# Patient Record
Sex: Male | Born: 1950 | Race: Black or African American | Hispanic: No | Marital: Single | Smoking: Former smoker
Health system: Southern US, Community
[De-identification: ages and names within clinical notes are randomized; demographics above are authoritative.]

## PROBLEM LIST (undated history)

## (undated) DIAGNOSIS — R4182 Altered mental status, unspecified: Secondary | ICD-10-CM

## (undated) DIAGNOSIS — K219 Gastro-esophageal reflux disease without esophagitis: Secondary | ICD-10-CM

## (undated) DIAGNOSIS — I214 Non-ST elevation (NSTEMI) myocardial infarction: Secondary | ICD-10-CM

## (undated) DIAGNOSIS — I739 Peripheral vascular disease, unspecified: Secondary | ICD-10-CM

## (undated) DIAGNOSIS — J9621 Acute and chronic respiratory failure with hypoxia: Secondary | ICD-10-CM

## (undated) DIAGNOSIS — E785 Hyperlipidemia, unspecified: Secondary | ICD-10-CM

## (undated) DIAGNOSIS — R195 Other fecal abnormalities: Secondary | ICD-10-CM

## (undated) DIAGNOSIS — I2721 Secondary pulmonary arterial hypertension: Secondary | ICD-10-CM

## (undated) DIAGNOSIS — I219 Acute myocardial infarction, unspecified: Secondary | ICD-10-CM

## (undated) DIAGNOSIS — I2510 Atherosclerotic heart disease of native coronary artery without angina pectoris: Secondary | ICD-10-CM

## (undated) DIAGNOSIS — I10 Essential (primary) hypertension: Secondary | ICD-10-CM

## (undated) DIAGNOSIS — I63411 Cerebral infarction due to embolism of right middle cerebral artery: Secondary | ICD-10-CM

## (undated) DIAGNOSIS — J449 Chronic obstructive pulmonary disease, unspecified: Secondary | ICD-10-CM

## (undated) DIAGNOSIS — C61 Malignant neoplasm of prostate: Secondary | ICD-10-CM

## (undated) HISTORY — DX: Acute myocardial infarction, unspecified: I21.9

## (undated) HISTORY — DX: Hyperlipidemia, unspecified: E78.5

## (undated) HISTORY — DX: Other fecal abnormalities: R19.5

## (undated) HISTORY — DX: Essential (primary) hypertension: I10

## (undated) HISTORY — PX: CARDIAC CATHETERIZATION: SHX172

## (undated) HISTORY — DX: Peripheral vascular disease, unspecified: I73.9

---

## 2007-04-01 ENCOUNTER — Ambulatory Visit: Payer: Self-pay | Admitting: Vascular Surgery

## 2007-09-17 DIAGNOSIS — I219 Acute myocardial infarction, unspecified: Secondary | ICD-10-CM

## 2007-09-17 HISTORY — DX: Acute myocardial infarction, unspecified: I21.9

## 2007-09-17 HISTORY — PX: OTHER SURGICAL HISTORY: SHX169

## 2007-11-10 ENCOUNTER — Ambulatory Visit: Payer: Self-pay | Admitting: Vascular Surgery

## 2007-11-18 ENCOUNTER — Inpatient Hospital Stay (HOSPITAL_COMMUNITY): Admission: EM | Admit: 2007-11-18 | Discharge: 2007-11-22 | Payer: Self-pay | Admitting: Emergency Medicine

## 2007-11-18 ENCOUNTER — Ambulatory Visit: Payer: Self-pay | Admitting: *Deleted

## 2007-12-28 ENCOUNTER — Ambulatory Visit: Payer: Self-pay | Admitting: Cardiology

## 2007-12-30 ENCOUNTER — Ambulatory Visit: Payer: Self-pay | Admitting: Cardiology

## 2007-12-30 LAB — CONVERTED CEMR LAB
Albumin: 3.9 g/dL (ref 3.5–5.2)
Alkaline Phosphatase: 116 units/L (ref 39–117)
Basophils Relative: 0.6 % (ref 0.0–1.0)
Eosinophils Relative: 2 % (ref 0.0–5.0)
HDL: 30.7 mg/dL — ABNORMAL LOW (ref >39.0)
Hemoglobin: 10.5 g/dL — ABNORMAL LOW (ref 13.0–17.0)
LDL Cholesterol: 63 mg/dL (ref 0–99)
Lymphocytes Relative: 29.7 % (ref 12.0–46.0)
MCV: 90.4 fL (ref 78.0–100.0)
Neutrophils Relative %: 59.3 % (ref 43.0–77.0)
RBC: 3.5 M/uL — ABNORMAL LOW (ref 4.22–5.81)
VLDL: 13 mg/dL (ref 0–40)
WBC: 6.7 10*3/uL (ref 4.5–10.5)

## 2008-01-12 ENCOUNTER — Ambulatory Visit: Payer: Self-pay | Admitting: Cardiology

## 2008-01-12 LAB — CONVERTED CEMR LAB
Folate: 7.7 ng/mL
Saturation Ratios: 4.9 % — ABNORMAL LOW (ref 20.0–50.0)
Transferrin: 321.6 mg/dL (ref >=212.0)

## 2008-01-22 ENCOUNTER — Telehealth: Payer: Self-pay | Admitting: Gastroenterology

## 2008-01-22 ENCOUNTER — Encounter: Payer: Self-pay | Admitting: Internal Medicine

## 2008-01-22 ENCOUNTER — Emergency Department (HOSPITAL_COMMUNITY): Admission: EM | Admit: 2008-01-22 | Discharge: 2008-01-22 | Payer: Self-pay | Admitting: Emergency Medicine

## 2008-01-22 ENCOUNTER — Ambulatory Visit: Payer: Self-pay | Admitting: Cardiology

## 2008-01-22 LAB — CONVERTED CEMR LAB
OCCULT 1: POSITIVE — AB
OCCULT 2: POSITIVE — AB
OCCULT 3: POSITIVE — AB
OCCULT 4: POSITIVE — AB

## 2008-01-26 ENCOUNTER — Ambulatory Visit: Payer: Self-pay | Admitting: Internal Medicine

## 2008-02-09 ENCOUNTER — Ambulatory Visit: Payer: Self-pay | Admitting: Cardiology

## 2008-02-09 ENCOUNTER — Ambulatory Visit: Payer: Self-pay

## 2008-02-15 ENCOUNTER — Ambulatory Visit: Payer: Self-pay | Admitting: Internal Medicine

## 2008-02-15 ENCOUNTER — Encounter: Payer: Self-pay | Admitting: Internal Medicine

## 2008-02-15 DIAGNOSIS — R195 Other fecal abnormalities: Secondary | ICD-10-CM

## 2008-02-15 DIAGNOSIS — D509 Iron deficiency anemia, unspecified: Secondary | ICD-10-CM

## 2008-02-16 LAB — CONVERTED CEMR LAB
Basophils Absolute: 0 10*3/uL (ref 0.0–0.1)
Basophils Relative: 0.3 % (ref 0.0–1.0)
Eosinophils Relative: 1.4 % (ref 0.0–5.0)
Lymphocytes Relative: 38.9 % (ref 12.0–46.0)
MCV: 86.5 fL (ref 78.0–100.0)
Monocytes Relative: 8.2 % (ref 3.0–12.0)
Neutrophils Relative %: 51.2 % (ref 43.0–77.0)
Platelets: 275 10*3/uL (ref 150–400)
RBC: 4.2 M/uL — ABNORMAL LOW (ref 4.22–5.81)
WBC: 5.9 10*3/uL (ref 4.5–10.5)

## 2008-02-17 ENCOUNTER — Encounter: Payer: Self-pay | Admitting: Internal Medicine

## 2008-03-08 ENCOUNTER — Ambulatory Visit: Payer: Self-pay | Admitting: Internal Medicine

## 2008-03-09 ENCOUNTER — Ambulatory Visit: Payer: Self-pay | Admitting: Cardiology

## 2008-03-09 LAB — CONVERTED CEMR LAB
OCCULT 1: NEGATIVE
OCCULT 5: NEGATIVE

## 2008-03-25 ENCOUNTER — Ambulatory Visit: Payer: Self-pay | Admitting: Internal Medicine

## 2008-03-25 LAB — CONVERTED CEMR LAB
Basophils Absolute: 0 10*3/uL (ref 0.0–0.1)
Eosinophils Absolute: 0.1 10*3/uL (ref 0.0–0.7)
HCT: 37.2 % — ABNORMAL LOW (ref 39.0–52.0)
Hemoglobin: 12.8 g/dL — ABNORMAL LOW (ref 13.0–17.0)
Iron: 67 ug/dL (ref 42–165)
MCHC: 34.4 g/dL (ref 30.0–36.0)
MCV: 87.9 fL (ref 78.0–100.0)
Monocytes Absolute: 0.7 10*3/uL (ref 0.1–1.0)
Monocytes Relative: 10.7 % (ref 3.0–12.0)
Neutro Abs: 3.2 10*3/uL (ref 1.4–7.7)
Platelets: 237 10*3/uL (ref 150–400)
RDW: 15.4 % — ABNORMAL HIGH (ref 11.5–14.6)
Transferrin: 268 mg/dL (ref >=212.0)

## 2008-03-29 ENCOUNTER — Encounter: Payer: Self-pay | Admitting: Internal Medicine

## 2008-06-02 ENCOUNTER — Ambulatory Visit: Payer: Self-pay | Admitting: Internal Medicine

## 2008-06-02 LAB — CONVERTED CEMR LAB
Eosinophils Relative: 2.3 % (ref 0.0–5.0)
HCT: 38.6 % — ABNORMAL LOW (ref 39.0–52.0)
Hemoglobin: 13.5 g/dL (ref 13.0–17.0)
Iron: 70 ug/dL (ref 42–165)
Lymphocytes Relative: 27.3 % (ref 12.0–46.0)
Monocytes Absolute: 0.8 10*3/uL (ref 0.1–1.0)
Monocytes Relative: 15.8 % — ABNORMAL HIGH (ref 3.0–12.0)
Neutro Abs: 3 10*3/uL (ref 1.4–7.7)
RDW: 12.2 % (ref 11.5–14.6)
Transferrin: 250.6 mg/dL (ref >=212.0)
WBC: 5.3 10*3/uL (ref 4.5–10.5)

## 2008-06-05 ENCOUNTER — Encounter: Payer: Self-pay | Admitting: Internal Medicine

## 2008-07-07 ENCOUNTER — Ambulatory Visit: Payer: Self-pay | Admitting: Cardiology

## 2008-07-12 ENCOUNTER — Ambulatory Visit: Payer: Self-pay | Admitting: Vascular Surgery

## 2008-08-22 ENCOUNTER — Ambulatory Visit: Payer: Self-pay

## 2008-08-22 ENCOUNTER — Ambulatory Visit: Payer: Self-pay | Admitting: Cardiology

## 2008-09-02 ENCOUNTER — Ambulatory Visit: Payer: Self-pay | Admitting: Internal Medicine

## 2008-09-02 LAB — CONVERTED CEMR LAB
HCT: 38.4 % — ABNORMAL LOW (ref 39.0–52.0)
Iron: 60 ug/dL (ref 42–165)
MCV: 94.1 fL (ref 78.0–100.0)
Monocytes Absolute: 0.6 10*3/uL (ref 0.1–1.0)
Monocytes Relative: 8.3 % (ref 3.0–12.0)
Neutro Abs: 4.8 10*3/uL (ref 1.4–7.7)
Platelets: 207 10*3/uL (ref 150–400)
RDW: 11.5 % (ref 11.5–14.6)

## 2008-09-15 DIAGNOSIS — E785 Hyperlipidemia, unspecified: Secondary | ICD-10-CM

## 2008-09-15 DIAGNOSIS — I252 Old myocardial infarction: Secondary | ICD-10-CM

## 2008-09-15 DIAGNOSIS — I739 Peripheral vascular disease, unspecified: Secondary | ICD-10-CM | POA: Insufficient documentation

## 2008-11-10 ENCOUNTER — Encounter: Payer: Self-pay | Admitting: Internal Medicine

## 2008-11-11 ENCOUNTER — Ambulatory Visit: Payer: Self-pay | Admitting: Internal Medicine

## 2008-11-11 LAB — CONVERTED CEMR LAB
Eosinophils Relative: 1.7 % (ref 0.0–5.0)
Iron: 84 ug/dL (ref 42–165)
MCV: 94.5 fL (ref 78.0–100.0)
Monocytes Relative: 8.7 % (ref 3.0–12.0)
Neutrophils Relative %: 54.1 % (ref 43.0–77.0)
Platelets: 209 10*3/uL (ref 150–400)
RDW: 11.5 % (ref 11.5–14.6)
Saturation Ratios: 24.6 % (ref 20.0–50.0)
Transferrin: 243.5 mg/dL (ref 212.0–360.0)

## 2009-03-23 ENCOUNTER — Ambulatory Visit: Payer: Self-pay | Admitting: Cardiology

## 2009-03-30 ENCOUNTER — Ambulatory Visit: Payer: Self-pay | Admitting: Cardiology

## 2009-04-18 ENCOUNTER — Ambulatory Visit: Payer: Self-pay | Admitting: Internal Medicine

## 2009-04-18 LAB — CONVERTED CEMR LAB
Basophils Absolute: 0 10*3/uL (ref 0.0–0.1)
Eosinophils Absolute: 0.1 10*3/uL (ref 0.0–0.7)
Hemoglobin: 13.2 g/dL (ref 13.0–17.0)
Lymphocytes Relative: 30.4 % (ref 12.0–46.0)
MCHC: 34.8 g/dL (ref 30.0–36.0)
Monocytes Absolute: 0.6 10*3/uL (ref 0.1–1.0)
Neutro Abs: 3.1 10*3/uL (ref 1.4–7.7)
Neutrophils Relative %: 57.4 % (ref 43.0–77.0)
RDW: 11.3 % — ABNORMAL LOW (ref 11.5–14.6)
Saturation Ratios: 30 % (ref 20.0–50.0)

## 2009-04-28 LAB — CONVERTED CEMR LAB
ALT: 26 units/L (ref 0–53)
AST: 25 units/L (ref 0–37)
Alkaline Phosphatase: 102 units/L (ref 39–117)
HDL: 29.5 mg/dL — ABNORMAL LOW (ref >39.00)
Total Bilirubin: 1.2 mg/dL (ref 0.3–1.2)
Triglycerides: 107 mg/dL (ref 0.0–149.0)

## 2009-05-03 ENCOUNTER — Encounter: Payer: Self-pay | Admitting: Cardiology

## 2009-07-06 ENCOUNTER — Ambulatory Visit: Payer: Self-pay | Admitting: Vascular Surgery

## 2009-10-12 ENCOUNTER — Ambulatory Visit: Payer: Self-pay | Admitting: Cardiology

## 2009-10-13 ENCOUNTER — Telehealth (INDEPENDENT_AMBULATORY_CARE_PROVIDER_SITE_OTHER): Payer: Self-pay | Admitting: *Deleted

## 2009-10-16 ENCOUNTER — Emergency Department (HOSPITAL_COMMUNITY): Admission: EM | Admit: 2009-10-16 | Discharge: 2009-10-16 | Payer: Self-pay | Admitting: Emergency Medicine

## 2009-11-29 ENCOUNTER — Ambulatory Visit: Payer: Self-pay | Admitting: Cardiovascular Disease

## 2009-11-29 ENCOUNTER — Encounter: Payer: Self-pay | Admitting: Cardiology

## 2009-11-30 ENCOUNTER — Encounter (INDEPENDENT_AMBULATORY_CARE_PROVIDER_SITE_OTHER): Payer: Self-pay | Admitting: *Deleted

## 2009-12-04 ENCOUNTER — Encounter: Payer: Self-pay | Admitting: Cardiology

## 2009-12-04 DIAGNOSIS — D649 Anemia, unspecified: Secondary | ICD-10-CM

## 2009-12-05 ENCOUNTER — Encounter: Payer: Self-pay | Admitting: Cardiology

## 2009-12-07 ENCOUNTER — Ambulatory Visit: Payer: Self-pay | Admitting: Cardiology

## 2009-12-07 DIAGNOSIS — I2510 Atherosclerotic heart disease of native coronary artery without angina pectoris: Secondary | ICD-10-CM | POA: Insufficient documentation

## 2009-12-08 LAB — CONVERTED CEMR LAB
Basophils Absolute: 0 10*3/uL (ref 0.0–0.1)
Basophils Relative: 0 % (ref 0–1)
HCT: 32.4 % — ABNORMAL LOW (ref 39.0–52.0)
Hemoglobin: 9.5 g/dL — ABNORMAL LOW (ref 13.0–17.0)
MCHC: 29.3 g/dL — ABNORMAL LOW (ref 30.0–36.0)
MCV: 83.9 fL (ref 78.0–100.0)
RBC: 3.86 M/uL — ABNORMAL LOW (ref 4.22–5.81)
RDW: 14.8 % (ref 11.5–15.5)

## 2009-12-12 ENCOUNTER — Ambulatory Visit: Payer: Self-pay | Admitting: Internal Medicine

## 2009-12-12 DIAGNOSIS — Z860100 Personal history of colon polyps, unspecified: Secondary | ICD-10-CM | POA: Insufficient documentation

## 2009-12-12 DIAGNOSIS — E119 Type 2 diabetes mellitus without complications: Secondary | ICD-10-CM

## 2009-12-12 DIAGNOSIS — K219 Gastro-esophageal reflux disease without esophagitis: Secondary | ICD-10-CM | POA: Insufficient documentation

## 2009-12-12 DIAGNOSIS — K5730 Diverticulosis of large intestine without perforation or abscess without bleeding: Secondary | ICD-10-CM | POA: Insufficient documentation

## 2009-12-13 LAB — CONVERTED CEMR LAB
Bilirubin Urine: NEGATIVE
Ferritin: 7.7 ng/mL — ABNORMAL LOW (ref 22.0–322.0)
Hemoglobin, Urine: NEGATIVE
Specific Gravity, Urine: >=1.03 (ref 1.000–1.030)
Urine Glucose: NEGATIVE mg/dL
Urobilinogen, UA: 1 (ref 0.0–1.0)

## 2009-12-18 ENCOUNTER — Telehealth: Payer: Self-pay | Admitting: Cardiology

## 2009-12-18 ENCOUNTER — Encounter: Payer: Self-pay | Admitting: Internal Medicine

## 2009-12-21 ENCOUNTER — Ambulatory Visit: Payer: Self-pay | Admitting: Cardiology

## 2009-12-22 ENCOUNTER — Telehealth: Payer: Self-pay | Admitting: Internal Medicine

## 2009-12-22 ENCOUNTER — Encounter: Payer: Self-pay | Admitting: Cardiology

## 2009-12-22 ENCOUNTER — Ambulatory Visit: Payer: Self-pay | Admitting: Internal Medicine

## 2009-12-22 ENCOUNTER — Encounter: Payer: Self-pay | Admitting: Internal Medicine

## 2010-01-02 LAB — CONVERTED CEMR LAB: Retic Ct Pct: 1.2 % (ref 0.4–3.1)

## 2010-01-15 ENCOUNTER — Encounter: Payer: Self-pay | Admitting: Internal Medicine

## 2010-01-23 ENCOUNTER — Ambulatory Visit (HOSPITAL_COMMUNITY): Admission: RE | Admit: 2010-01-23 | Discharge: 2010-01-23 | Payer: Self-pay | Admitting: Internal Medicine

## 2010-01-31 ENCOUNTER — Ambulatory Visit: Payer: Self-pay | Admitting: Cardiovascular Disease

## 2010-01-31 ENCOUNTER — Encounter: Payer: Self-pay | Admitting: Cardiology

## 2010-02-08 ENCOUNTER — Ambulatory Visit: Payer: Self-pay | Admitting: Cardiology

## 2010-02-14 ENCOUNTER — Encounter: Payer: Self-pay | Admitting: Internal Medicine

## 2010-02-14 ENCOUNTER — Ambulatory Visit: Payer: Self-pay | Admitting: Cardiovascular Disease

## 2010-02-23 ENCOUNTER — Ambulatory Visit: Payer: Self-pay | Admitting: Internal Medicine

## 2010-02-23 ENCOUNTER — Ambulatory Visit: Payer: Self-pay | Admitting: Cardiology

## 2010-02-23 LAB — CONVERTED CEMR LAB
Eosinophils Relative: 1.1 % (ref 0.0–5.0)
HCT: 24.2 % — ABNORMAL LOW (ref 39.0–52.0)
Hemoglobin: 8.3 g/dL — ABNORMAL LOW (ref 13.0–17.0)
Lymphs Abs: 2.1 10*3/uL (ref 0.7–4.0)
MCV: 95.1 fL (ref 78.0–100.0)
Monocytes Absolute: 0.6 10*3/uL (ref 0.1–1.0)
Monocytes Relative: 7.7 % (ref 3.0–12.0)
Neutro Abs: 5 10*3/uL (ref 1.4–7.7)
Platelets: 263 10*3/uL (ref 150.0–400.0)
RBC: 2.6 M/uL — ABNORMAL LOW (ref 4.22–5.81)

## 2010-02-25 LAB — CONVERTED CEMR LAB
ALT: 23 units/L (ref 0–53)
AST: 27 units/L (ref 0–37)
Alkaline Phosphatase: 85 units/L (ref 39–117)
Bilirubin, Direct: 0.1 mg/dL (ref 0.0–0.3)
Cholesterol: 129 mg/dL (ref 0–200)
LDL Cholesterol: 76 mg/dL (ref 0–99)
Total Bilirubin: 0.8 mg/dL (ref 0.3–1.2)
Triglycerides: 140 mg/dL (ref 0.0–149.0)
VLDL: 28 mg/dL (ref 0.0–40.0)

## 2010-02-26 ENCOUNTER — Ambulatory Visit (HOSPITAL_COMMUNITY): Admission: RE | Admit: 2010-02-26 | Discharge: 2010-02-26 | Payer: Self-pay | Admitting: Internal Medicine

## 2010-02-27 ENCOUNTER — Telehealth: Payer: Self-pay | Admitting: Cardiology

## 2010-02-28 ENCOUNTER — Encounter (HOSPITAL_COMMUNITY): Admission: RE | Admit: 2010-02-28 | Discharge: 2010-04-25 | Payer: Self-pay | Admitting: Internal Medicine

## 2010-03-07 ENCOUNTER — Ambulatory Visit: Payer: Self-pay | Admitting: Internal Medicine

## 2010-03-09 LAB — CONVERTED CEMR LAB
Basophils Relative: 0.2 % (ref 0.0–3.0)
Eosinophils Absolute: 0.1 10*3/uL (ref 0.0–0.7)
Eosinophils Relative: 1.5 % (ref 0.0–5.0)
Hemoglobin: 8.7 g/dL — ABNORMAL LOW (ref 13.0–17.0)
Lymphocytes Relative: 21.6 % (ref 12.0–46.0)
MCHC: 33 g/dL (ref 30.0–36.0)
MCV: 108 fL — ABNORMAL HIGH (ref 78.0–100.0)
Neutro Abs: 3.9 10*3/uL (ref 1.4–7.7)
Neutrophils Relative %: 67.4 % (ref 43.0–77.0)
RBC: 2.44 M/uL — ABNORMAL LOW (ref 4.22–5.81)
Saturation Ratios: 37.4 % (ref 20.0–50.0)
Transferrin: 223.7 mg/dL (ref 212.0–360.0)
WBC: 5.9 10*3/uL (ref 4.5–10.5)

## 2010-03-20 ENCOUNTER — Ambulatory Visit: Payer: Self-pay | Admitting: Internal Medicine

## 2010-03-20 LAB — CONVERTED CEMR LAB
Basophils Absolute: 0 10*3/uL (ref 0.0–0.1)
Basophils Relative: 0.3 % (ref 0.0–3.0)
Eosinophils Relative: 1.5 % (ref 0.0–5.0)
HCT: 38.1 % — ABNORMAL LOW (ref 39.0–52.0)
Lymphocytes Relative: 30.1 % (ref 12.0–46.0)
Lymphs Abs: 1.7 10*3/uL (ref 0.7–4.0)
Monocytes Relative: 8.9 % (ref 3.0–12.0)
Neutro Abs: 3.4 10*3/uL (ref 1.4–7.7)
RBC: 3.69 M/uL — ABNORMAL LOW (ref 4.22–5.81)
WBC: 5.8 10*3/uL (ref 4.5–10.5)

## 2010-03-27 ENCOUNTER — Telehealth (INDEPENDENT_AMBULATORY_CARE_PROVIDER_SITE_OTHER): Payer: Self-pay | Admitting: *Deleted

## 2010-04-02 ENCOUNTER — Ambulatory Visit: Payer: Self-pay | Admitting: Internal Medicine

## 2010-04-03 LAB — CONVERTED CEMR LAB
Basophils Relative: 0.3 % (ref 0.0–3.0)
Eosinophils Relative: 1.5 % (ref 0.0–5.0)
Hemoglobin: 11.2 g/dL — ABNORMAL LOW (ref 13.0–17.0)
Lymphocytes Relative: 27.1 % (ref 12.0–46.0)
Monocytes Relative: 7.1 % (ref 3.0–12.0)
Neutro Abs: 3.8 10*3/uL (ref 1.4–7.7)
Neutrophils Relative %: 64 % (ref 43.0–77.0)
RBC: 3.23 M/uL — ABNORMAL LOW (ref 4.22–5.81)
WBC: 6 10*3/uL (ref 4.5–10.5)

## 2010-06-25 ENCOUNTER — Ambulatory Visit: Payer: Self-pay | Admitting: Internal Medicine

## 2010-08-24 ENCOUNTER — Ambulatory Visit: Payer: Self-pay | Admitting: Internal Medicine

## 2010-09-06 ENCOUNTER — Ambulatory Visit: Payer: Self-pay | Admitting: Cardiology

## 2010-10-17 NOTE — Assessment & Plan Note (Signed)
Summary: PA CHRIS BERGE/SB   Allergies: No Known Drug Allergies   Pt was randomized to Pegasus-TIMI study on 11/29/09.Pt started Pegaus Study drug on 11/29/09.

## 2010-10-17 NOTE — Progress Notes (Signed)
  Faxed ROI over to [facility] to fax [phone] Call [phone] Cala Bradford Mesiemore  October 13, 2009 2:46 PM    Appended Document:  Recieved Records from [facility] went ahead and scanned into EMR.

## 2010-10-17 NOTE — Assessment & Plan Note (Signed)
Summary: ROV/per Dr.Annetta Deiss   Visit Type:  Follow-up  CC:  Some palpitations.  History of Present Illness: Overall feelinig quite well.  Had an accident at work, and stepped in a hole at work.  Had workup with xrays and now seeing chiropractor.  No chest pain or cardiac symptoms.  Stopped iron supplement after last CBC with Dr. Juanda Chance per their recommendations.  We enrolled him in a research trial, and up front labs suggested recurrent anemia, with drop in MCV.  Denies bleeding from bowel or hematuria.  Current Medications (verified): 1)  Coreg 6.25 Mg Tabs (Carvedilol) .... Take 1 Tablet By Mouth Two Times A Day 2)  Pravastatin Sodium 20 Mg Tabs (Pravastatin Sodium) .... Take 1 Tablet By Mouth Once A Day 3)  Saw Palmetto 1000 Mg Caps (Saw Palmetto (Serenoa Repens)) .... Once A Day 4)  Quinapril Hcl 10 Mg Tabs (Quinapril Hcl) .... Take 1 Tablet By Mouth Once A Day 5)  Lisinopril 5 Mg Tabs (Lisinopril) .... Take One Tablet By Mouth Daily 6)  Aspirin 81 Mg Tbec (Aspirin) .... Take One Tablet By Mouth Daily 7)  Nitroglycerin 0.4 Mg Subl (Nitroglycerin) .... One Tablet Under Tongue Every 5 Minutes As Needed For Chest Pain---May Repeat Times Three 8)  Glipizide-Metformin Hcl 5-500 Mg Tabs (Glipizide-Metformin Hcl) .... Take 1 Tablet By Mouth Two Times A Day 9)  Fish Oil 1000 Mg Caps (Omega-3 Fatty Acids) .... Take 1 Capsule By Mouth Two Times A Day  Allergies (verified): No Known Drug Allergies  Past History:  Past Medical History: Last updated: 09/15/2008 Myocardial infarction, hx of Hypertension Type 2 Diabetes mellitus tobacco use Peripheral vascular disease Hyperlipidemia heme positive stools  Review of Systems  The patient denies anorexia, fever, weight loss, weight gain, hemoptysis, abdominal pain, melena, hematochezia, severe indigestion/heartburn, hematuria, and incontinence.    Vital Signs:  Patient profile:   60 year old male Height:      72 inches Weight:      223  pounds BMI:     30.35 Pulse rate:   74 / minute Pulse rhythm:   regular Resp:     18 per minute BP sitting:   145 / 87  (left arm) Cuff size:   large  Vitals Entered By: Vikki Ports (December 07, 2009 9:17 AM)  Physical Exam  General:  Well developed, well nourished, in no acute distress. Head:  normocephalic and atraumatic Eyes:  PERRLA/EOM intact; conjunctiva and lids normal. Lungs:  Clear bilaterally to auscultation and percussion. Heart:  Non-displaced PMI, chest non-tender; regular rate and rhythm, S1, S2 without murmurs, rubs or gallops. Carotid upstroke normal, no bruit. Normal  Abdomen:  Bowel sounds positive; abdomen soft and non-tender without masses, organomegaly, or hernias noted. No hepatosplenomegaly. Rectal:  normal external exam.  Heme negative today. Msk:  Back normal, normal gait. Muscle strength and tone normal. Extremities:  No clubbing or cyanosis.  Trace edema. Neurologic:  Alert and oriented x 3.   EKG  Procedure date:  12/07/2009  Findings:      NSR.  WNL.   Impression & Recommendations:  Problem # 1:  ANEMIA (ICD-285.9)  recurrent.  May need to see Dr. Juanda Chance.  heme neg today.  Check labs including urine.    Orders: TLB-Ferritin (82728-FER) T-Iron ([phone]) T-Iron Binding Capacity (TIBC) ([address]) T-Reticulocyte Count, Manual (82956) TLB-Udip ONLY (81003-UDIP) EKG w/ Interpretation (93000)  Problem # 2:  HYPERLIPIDEMIA (ICD-272.4)  Followed with labs. His updated medication list for this problem includes:  Pravastatin Sodium 20 Mg Tabs (Pravastatin sodium) .Marland Kitchen... Take 1 tablet by mouth once a day  His updated medication list for this problem includes:    Pravastatin Sodium 20 Mg Tabs (Pravastatin sodium) .Marland Kitchen... Take 1 tablet by mouth once a day  Problem # 3:  MYOCARDIAL INFARCTION, HX OF (ICD-412) remains stable on meds.  His updated medication list for this problem includes:    Coreg 6.25 Mg Tabs (Carvedilol) .Marland Kitchen... Take 1 tablet  by mouth two times a day    Quinapril Hcl 10 Mg Tabs (Quinapril hcl) .Marland Kitchen... Take 1 tablet by mouth once a day    Lisinopril 5 Mg Tabs (Lisinopril) .Marland Kitchen... Take one tablet by mouth daily    Aspirin 81 Mg Tbec (Aspirin) .Marland Kitchen... Take one tablet by mouth daily    Nitroglycerin 0.4 Mg Subl (Nitroglycerin) ..... One tablet under tongue every 5 minutes as needed for chest pain---may repeat times three  Problem # 4:  CAD, NATIVE VESSEL (ICD-414.01) seee above. His updated medication list for this problem includes:    Coreg 6.25 Mg Tabs (Carvedilol) .Marland Kitchen... Take 1 tablet by mouth two times a day    Quinapril Hcl 10 Mg Tabs (Quinapril hcl) .Marland Kitchen... Take 1 tablet by mouth once a day    Lisinopril 5 Mg Tabs (Lisinopril) .Marland Kitchen... Take one tablet by mouth daily    Aspirin 81 Mg Tbec (Aspirin) .Marland Kitchen... Take one tablet by mouth daily    Nitroglycerin 0.4 Mg Subl (Nitroglycerin) ..... One tablet under tongue every 5 minutes as needed for chest pain---may repeat times three  Patient Instructions: 1)  Your physician recommends that you schedule a follow-up appointment in: 6 weeks 2)  Your physician recommends that you have  lab work today:iron,ferritin,tibc, retic count, ua 3)  Your physician has recommended you make the following change in your medication: restart iron

## 2010-10-17 NOTE — Procedures (Signed)
Summary: 792.1, 280.9/dn  Patient here today for capsule endoscopy for Dr.Madolyn Ackroyd .  Pt verbalized understanding of all verbal and written instructions.  Pt tolerated well.  Lot #  [id number]  exp 2012/05 .

## 2010-10-17 NOTE — Miscellaneous (Signed)
  Clinical Lists Changes  Observations: Added new observation of RS STUDY: Pegasus Study (11/30/2009 14:23) Added new observation of RESEARCHCAND: Cardiology (11/30/2009 14:23)      Research Study Name: [employer]

## 2010-10-17 NOTE — Progress Notes (Signed)
Summary: Repeat Labs  Phone Note Call from Patient Call back at Home Phone [phone] Call back at [phone]   Caller: Patient Reason for Call: Talk to Nurse Summary of Call: returning call Initial call taken by: Migdalia Dk,  December 18, 2009 1:26 PM  Follow-up for Phone Call        I spoke with the pt about the results of his urine specimen and blood work.  The pt cannot come back into the office until Thursday  to do a urine Culture & Sensitivity.  The pt will also need an Iron, TIBC and Reticulocyte Count done since these were not drawn on 12/07/09 when this was ordered.  Order for labwork placed in [facility]. Follow-up by: Julieta Gutting, RN, BSN,  December 18, 2009 3:00 PM

## 2010-10-17 NOTE — Procedures (Signed)
Summary: Capsule Endoscopy/[facility]  Capsule Endoscopy/[facility]   Imported By: Sherian Rein 01/16/2010 14:32:41  _____________________________________________________________________  External Attachment:    Type:   Image     Comment:   External Document

## 2010-10-17 NOTE — Assessment & Plan Note (Signed)
Summary: ANEMIA, HGB 9.5/DN   History of Present Illness Visit Type: Follow-up Visit Primary GI MD: Lina Sar MD Primary Provider: Lucianne Lei, MD Requesting Provider: Eustaquio Maize, MD Chief Complaint: Patient referred for low HGB. Patient denies any Gi complaints.  History of Present Illness:   This is a 60 year old African American patient of Dr. Riley Kill with anemia. His latest hemoglobin was 9.5. He is here today because a significan drop in hemoglobin. He has a history of chronic GI blood loss. His upper endoscopy and colonoscopy completed in June 2009 were both negative except for mild inflammation in the distal esophagus and mild diverticulosis. There was a small polyp removed from his colon. He responded to iron supplements and his hemoglobin promptly increased to 12.8, with a hematocrit of 37.2 in July 2009. In September 2009, his hemoglobin was 13.5 and his hematocrit was 38.6. In February 2010, his hemoglobin was 13.5, in August 2010 hemoglobin was 13.2 with a 30% iron saturation. On 12/05/09, hemoglobin was 9.5, hematocrit was 32.4 and MCV was 83 with a ferritin of 7.0. He takes aspirin 81 mg daily. Additional medical problems are peripheral vascular disease, history of myocardial infarction in March 2009 with a bare-metal stent placement x 3. He is a smoker and type II Diabetic.   GI Review of Systems      Denies abdominal pain, acid reflux, belching, bloating, chest pain, dysphagia with liquids, dysphagia with solids, heartburn, loss of appetite, nausea, vomiting, vomiting blood, weight loss, and  weight gain.        Denies anal fissure, black tarry stools, change in bowel habit, constipation, diarrhea, diverticulosis, fecal incontinence, heme positive stool, hemorrhoids, irritable bowel syndrome, jaundice, light color stool, liver problems, rectal bleeding, and  rectal pain.    Current Medications (verified): 1)  Coreg 6.25 Mg Tabs (Carvedilol) .... Take 1 Tablet By Mouth Two  Times A Day 2)  Pravastatin Sodium 20 Mg Tabs (Pravastatin Sodium) .... Take 1 Tablet By Mouth Once A Day 3)  Saw Palmetto 1000 Mg Caps (Saw Palmetto (Serenoa Repens)) .... Once A Day 4)  Quinapril Hcl 10 Mg Tabs (Quinapril Hcl) .... Take 1 Tablet By Mouth Once A Day 5)  Lisinopril 5 Mg Tabs (Lisinopril) .... Take One Tablet By Mouth Daily 6)  Aspirin 81 Mg Tbec (Aspirin) .... Take One Tablet By Mouth Daily 7)  Nitroglycerin 0.4 Mg Subl (Nitroglycerin) .... One Tablet Under Tongue Every 5 Minutes As Needed For Chest Pain---May Repeat Times Three 8)  Glipizide-Metformin Hcl 5-500 Mg Tabs (Glipizide-Metformin Hcl) .... Take 1 Tablet By Mouth Two Times A Day 9)  Fish Oil 1000 Mg Caps (Omega-3 Fatty Acids) .... Take 1 Capsule By Mouth Two Times A Day 10)  Ferrous Sulfate 325 (65 Fe) Mg Tabs (Ferrous Sulfate) .... Take One Tablet Daily  Allergies (verified): No Known Drug Allergies  Past History:  Past Medical History: Reviewed history from 09/15/2008 and no changes required. Myocardial infarction, hx of Hypertension Type 2 Diabetes mellitus tobacco use Peripheral vascular disease Hyperlipidemia heme positive stools  Past Surgical History: Reviewed history from 12/12/2009 and no changes required. 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  Alcohol Use - no Illicit Drug Use - no  Review of Systems       The patient complains of anemia, back pain, fatigue,  and muscle pains/cramps.  The patient denies allergy/sinus, anxiety-new, arthritis/joint pain, blood in urine, breast changes/lumps, change in vision, confusion, cough, coughing up blood, depression-new, fainting, fever, headaches-new, hearing problems, heart murmur, heart rhythm changes, itching, menstrual pain, night  sweats, nosebleeds, pregnancy symptoms, shortness of breath, skin rash, sleeping problems, sore throat, swelling of feet/legs, swollen lymph glands, thirst - excessive, urination - excessive, urination changes/pain, urine leakage, vision changes, and voice change.         Pertinent positive and negative review of systems were noted in the above HPI. All other ROS was otherwise negative.   Vital Signs:  Patient profile:   60 year old male Height:      72 inches Weight:      225.0 pounds BMI:     30.63 Pulse rate:   84 / minute Pulse rhythm:   regular BP sitting:   150 / 76  (left arm) Cuff size:   regular  Vitals Entered By: Harlow Mares CMA Duncan Dull) (December 12, 2009 1:28 PM)  Physical Exam  General:  Well developed, well nourished, no acute distress. Eyes:  PERRLA, no icterus. Mouth:  No deformity or lesions, dentition normal. Neck:  Supple; no masses or thyromegaly. Lungs:  Clear throughout to auscultation. Heart:  Regular rate and rhythm; no murmurs, rubs,  or bruits. Abdomen:  soft, nontender abdomen with normoactive bowel sounds. No distention. No bruits. Liver edge at costal margin. Rectal:  normal rectal sphincter tone. Stool is Hemoccult positive. Extremities:  No clubbing, cyanosis, edema or deformities noted. Skin:  hyperpigmentation in the right iliac crest. Psych:  Alert and cooperative. Normal mood and affect.   Impression & Recommendations:  Problem # 1:  ANEMIA (ICD-285.9)  Patient has recurrent episodes of iron deficiency anemia. His chronic GI blood loss was not defined after a normal endoscopy and colonoscopy in 2009. I suspect he has AV malformations. He is Hemoccult-positive today. We will proceed with a small bowel capsule endoscopy. We have talked about AVMs today. He will have to stop his iron supplements for several days prior to the capsule endoscopy and then resume it right after that. It appears that when he is on iron supplements he is able to maintain his  normal hemoglobin.  Orders: Capsule Endoscopy (Capsule Endoscopy)  Problem # 2:  CAD, NATIVE VESSEL (ICD-414.01) Patient is followed by Dr. Riley Kill.  Problem # 3:  COLONIC POLYPS, BENIGN, HX OF (ICD-V12.72) Patient's last colonoscopy was in June 2009. A recall colonoscopy will be scheduled in 2016.  Patient Instructions: 1)  small bowel capsule endoscopy. 2)  Discontinue iron supplements prior to the exam. 3)  Hemoglobin and hematocrit to be checked every 3 months. 4)  Continue use of iron after capsule endoscopy is completed. I asked him not to ever stop taking his iron. 5)  Copy sent to : Dr Ermalene Postin, Lucianne Lei 6)  The medication list was reviewed and reconciled.  All changed / newly prescribed medications were explained.  A complete medication list was provided to the patient / caregiver.

## 2010-10-17 NOTE — Miscellaneous (Signed)
  Clinical Lists Changes  Medications: Added new medication of * PEGASUS STUDY DRUG

## 2010-10-17 NOTE — Procedures (Signed)
Summary: Capsule Endoscopy   Capsule Endoscopy  Procedure date:  12/18/2009  Findings:      Performing Location: [facility]   Ordering Physician: Lina Sar, MD  Report created/read by: Stan Head, MD  Reason for Referral:  60 y/o male with iron deficiency anemia and heme positive stool  Procedure Information and Findings:  1) complete study, good prep 2) few duodenal erosions 3 ) small AVM at 17 minutes 4) 2 larger AVMs at around 3 hours- almost 3 hours beyond first duodenal image and therefore not reachable with enteroscopy.  Summary and Recommendations:  Per Dr Juanda Chance  This report was created from the original report, which was reviewed and signed by the above listed reading physician.   Appended Document: Capsule Endoscopy I tried to reach him at homeand at work. Will try again  Appended Document: Capsule Endoscopy I tried to call him again, left message.

## 2010-10-17 NOTE — Assessment & Plan Note (Signed)
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 He has chronic low-grade gastroentestinal blood loss. His hemoglobin dropped to 8.3 on June 10. He received an iron infusion on 02/26/10. His hemoglobin on 03/07/10 was 8.7. His MCV was 108. He has a history of coronary artery disease and is status post myocardial infarction and peripheral vascular diseas. His MI was in 2009 and he is status post bare-metal stent placement. He has been followed by Dr Riley Kill. He is a diabetic. Patient has been feeling much stronger since he has received his iron infusion. He denies any abdominal pain, dysphagia or change in bowel habits. He has been on iron sulfate 3 times a day.   GI Review of Systems      Denies abdominal pain, acid reflux, belching, bloating, chest pain, dysphagia with liquids, dysphagia with solids, heartburn, loss of appetite, nausea, vomiting, vomiting blood, weight loss, and  weight gain.        Denies anal fissure, black tarry stools, change in bowel habit, constipation, diarrhea, diverticulosis, fecal incontinence, heme positive stool, hemorrhoids, irritable bowel syndrome, jaundice, light color stool, liver problems, rectal bleeding, and  rectal pain.    Current Medications (verified): 1)  Coreg 6.25 Mg Tabs (Carvedilol) .... Take 1 Tablet By Mouth Two Times A Day 2)  Pravastatin Sodium 20 Mg Tabs (Pravastatin Sodium) .... Take 1 Tablet By Mouth Once A Day 3)  Saw Palmetto 1000 Mg Caps (Saw Palmetto (Serenoa Repens)) .... Once A Day 4)  Quinapril Hcl 20 Mg Tabs (Quinapril Hcl) .... Take 1 Tablet By Mouth Once A Day 5)   Lisinopril 5 Mg Tabs (Lisinopril) .... Take One Tablet By Mouth Daily 6)  Nitroglycerin 0.4 Mg Subl (Nitroglycerin) .... One Tablet Under Tongue Every 5 Minutes As Needed For Chest Pain---May Repeat Times Three 7)  Glipizide-Metformin Hcl 5-500 Mg Tabs (Glipizide-Metformin Hcl) .... Take 1 Tablet By Mouth Two Times A Day 8)  Fish Oil 1000 Mg Caps (Omega-3 Fatty Acids) .... Take 1 Capsule By Mouth Two Times A Day 9)  Ferrous Sulfate 325 (65 Fe) Mg Tabs (Ferrous Sulfate) .... Take One Tablet Daily  Allergies (verified): No Known Drug Allergies  Past History:  Past Medical History: Reviewed history from 09/15/2008 and no changes required. Myocardial infarction, hx of Hypertension Type 2 Diabetes mellitus tobacco use Peripheral vascular disease Hyperlipidemia heme positive stools  Past Surgical History: Reviewed history from 12/12/2009 and no changes required. Cardiac Stents- March 2009  Family History: Reviewed history from 12/12/2009 and no changes required. 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  Alcohol Use - no Illicit Drug Use - no  Review of Systems       The patient complains of anemia, back pain, depression-new, and muscle pains/cramps.  The patient denies allergy/sinus, anxiety-new, arthritis/joint pain, blood in urine, breast changes/lumps, change in vision, confusion, cough, coughing up blood, fainting, fatigue, fever, headaches-new, hearing problems, heart murmur, heart rhythm changes, itching, menstrual pain, night sweats, nosebleeds, pregnancy symptoms, shortness of breath, skin rash, sleeping problems, sore throat,  swelling of feet/legs, swollen lymph glands, thirst - excessive , urination - excessive , urination changes/pain, urine leakage, vision changes, and voice change.         Pertinent  positive and negative review of systems were noted in the above HPI. All other ROS was otherwise negative.   Vital Signs:  Patient profile:   60 year old male Height:      72 inches Weight:      222.38 pounds BMI:     30.27 Pulse rate:   64 / minute Pulse rhythm:   regular BP sitting:   120 / 68  (left arm) Cuff size:   regular  Vitals Entered By: June McMurray CMA Duncan Dull) (March 20, 2010 1:26 PM)  Physical Exam  General:  Well developed, well nourished, no acute distress. Eyes:  nonicteric. Neck:  Supple; no masses or thyromegaly. Lungs:  Clear throughout to auscultation. Heart:  Regular rate and rhythm; no murmurs, rubs,  or bruits. Abdomen:  soft nontender abdomen with normoactive bowel sounds. No tenderness. Liver edge at costal margin. Extremities:  No clubbing, cyanosis, edema or deformities noted. Skin:  Intact without significant lesions or rashes. Psych:  Alert and cooperative. Normal mood and affect.   Impression & Recommendations:  Problem # 1:  ANEMIA (ICD-285.9) Patient has chronic low-grade GI blood loss due to AVMs which were demonstrated on a recent small bowel capsule endoscopy. I have talked to the patient extensively concerning his need to continue his iron supplements indefinitely and also to have his blood count checked monthly. He is currently holding his aspirin until he sees Dr. Riley Kill. Dr.Stuckey will tell him if he needs to go back on his aspirin.   Orders: TLB-CBC Platelet - w/Differential ([id number])  Problem # 2:  COLONIC POLYPS, BENIGN, HX OF (ICD-V12.72) Patient is up-to-date on his colonoscopy and endoscopy which were done in June 2009.  Patient Instructions: 1)  Please go to the basement floor of our office to have your CBC drawn today. 2)  stay off aspirin unless Dr Riley Kill intructs You otherwise. 3)  Please schedule a follow-up appointment as needed.  4)  Copy sent to : Dr Lucianne Lei, Dr Shawnie Pons 5)  The medication list was reviewed  and reconciled.  All changed / newly prescribed medications were explained.  A complete medication list was provided to the patient / caregiver.

## 2010-10-17 NOTE — Procedures (Signed)
Summary: Instructions for procedure/[facility]  Instructions for procedure/[facility]   Imported By: Sherian Rein 12/16/2009 09:30:09  _____________________________________________________________________  External Attachment:    Type:   Image     Comment:   External Document

## 2010-10-17 NOTE — Assessment & Plan Note (Signed)
Summary: f32m   Visit Type:  6 months follow up  CC:  No complains.  History of Present Illness: Still tossing boxes, but not doing exercising.  Patient continues to smoke.  He has no ability to stop.  No chest pain whatsoever.   Current Medications (verified): 1)  Coreg 6.25 Mg Tabs (Carvedilol) .... Take 1 Tablet By Mouth Two Times A Day 2)  Pravastatin Sodium 20 Mg Tabs (Pravastatin Sodium) .... Take 1 Tablet By Mouth Once A Day 3)  Saw Palmetto 1000 Mg Caps (Saw Palmetto (Serenoa Repens)) .... Once A Day 4)  Quinapril Hcl 10 Mg Tabs (Quinapril Hcl) .... Take 1 Tablet By Mouth Once A Day 5)  Lisinopril 5 Mg Tabs (Lisinopril) .... Take One Tablet By Mouth Daily 6)  Aspirin 81 Mg Tbec (Aspirin) .... Take One Tablet By Mouth Daily 7)  Nitroglycerin 0.4 Mg Subl (Nitroglycerin) .... One Tablet Under Tongue Every 5 Minutes As Needed For Chest Pain---May Repeat Times Three 8)  Glipizide-Metformin Hcl 5-500 Mg Tabs (Glipizide-Metformin Hcl) .... Take 1 Tablet By Mouth Two Times A Day 9)  Fish Oil 1000 Mg Caps (Omega-3 Fatty Acids) .... Take 1 Capsule By Mouth Two Times A Day  Allergies (verified): No Known Drug Allergies  Vital Signs:  Patient profile:   60 year old male Height:      72 inches Weight:      224.25 pounds BMI:     30.52 Pulse rate:   77 / minute Pulse rhythm:   regular Resp:     18 per minute BP sitting:   148 / 84  (left arm) Cuff size:   large  Vitals Entered By: Vikki Ports (October 12, 2009 4:37 PM)  Physical Exam  General:  Well developed, well nourished, in no acute distress. Head:  normocephalic and atraumatic Eyes:  PERRLA/EOM intact; conjunctiva and lids normal. Lungs:  Clear bilaterally to auscultation and percussion. Heart:  Non-displaced PMI, chest non-tender; regular rate and rhythm, S1, S2 without murmurs, rubs or gallops. Carotid upstroke normal, no bruit. Normal abdominal aortic size, no bruits. Femorals normal pulses, no bruits. Pedals normal  pulses. No edema, no varicosities. Abdomen:  Bowel sounds positive; abdomen soft and non-tender without masses, organomegaly, or hernias noted. No hepatosplenomegaly. Pulses:  pulses normal in all 4 extremities Extremities:  No clubbing or cyanosis. Neurologic:  Alert and oriented x 3.   EKG  Procedure date:  10/12/2009  Findings:      NSR.  Possible old inferior MI of indeterminate age.  Impression & Recommendations:  Problem # 1:  MYOCARDIAL INFARCTION, HX OF (ICD-412) Continues to remain stable.  Continues to smoke unfortunately.  Issue discussed with techniques to quit.   His updated medication list for this problem includes:    Coreg 6.25 Mg Tabs (Carvedilol) .Marland Kitchen... Take 1 tablet by mouth two times a day    Quinapril Hcl 10 Mg Tabs (Quinapril hcl) .Marland Kitchen... Take 1 tablet by mouth once a day    Lisinopril 5 Mg Tabs (Lisinopril) .Marland Kitchen... Take one tablet by mouth daily    Aspirin 81 Mg Tbec (Aspirin) .Marland Kitchen... Take one tablet by mouth daily    Nitroglycerin 0.4 Mg Subl (Nitroglycerin) ..... One tablet under tongue every 5 minutes as needed for chest pain---may repeat times three  Orders: EKG w/ Interpretation (93000)  Problem # 2:  HYPERLIPIDEMIA (ICD-272.4) Remains on treatment.  had some recent labs done in [place]. His updated medication list for this problem includes:    Pravastatin  Sodium 20 Mg Tabs (Pravastatin sodium) .Marland Kitchen... Take 1 tablet by mouth once a day  Problem # 3:  PERIPHERAL VASCULAR DISEASE (ICD-443.9)  Patient Instructions: 1)  Your physician recommends that you continue on your current medications as directed. Please refer to the Current Medication list given to you today. 2)  Your physician wants you to follow-up in:  6 MONTHS. You will receive a reminder letter in the mail two months in advance. If you don't receive a letter, please call our office to schedule the follow-up appointment.

## 2010-10-17 NOTE — Progress Notes (Signed)
Summary: lab results  Phone Note Call from Patient Call back at [phone]   Caller: Patient Reason for Call: Talk to Nurse Summary of Call: returning call Initial call taken by: Migdalia Dk,  February 27, 2010 2:20 PM  Follow-up for Phone Call        I spoke with the pt and made him aware of his lab results.  The pt will hold his ASA at this time per Dr Rosalyn Charters instructions.  The pt is scheduled for another Iron infusion on 02/28/10. Follow-up by: Julieta Gutting, RN, BSN,  February 27, 2010 2:55 PM    New/Updated Medications: ASPIRIN 81 MG TBEC (ASPIRIN) Take one tablet by mouth daily--on hold

## 2010-10-17 NOTE — Miscellaneous (Signed)
Summary: Orders Update  Clinical Lists Changes  Problems: Added new problem of ANEMIA (ICD-285.9) - Signed Orders: Added new Test order of T-CBC w/Diff [phone]) - Signed   Ordered per Fannie Knee in research.

## 2010-10-17 NOTE — Progress Notes (Signed)
Summary: Iron Infusion Scheduled & F/U Labs Scheduled  Phone Note Outgoing Call   Call placed by: Laureen Ochs LPN,  December 22, 2009 8:43 AM Call placed to: Patient Summary of Call: Pt. is scheduled for an Iron Infusion w/test dose at [facility] on 4 27-11 at 8am. Pt. is here for his Capsule Endo, I have given him his instructions for the Iron Infusion.  Pt. is also scheduled for F/U labs on 4-25-11and every 3 monthes x3, per Dr.Brodie. (Lab orders are in IDX) Pt. instructed to call back as needed.  Initial call taken by: Laureen Ochs LPN,  December 22, 2009 8:44 AM     Appended Document: Iron Infusion Scheduled & F/U Labs Scheduled CORRECTION--Pt. will have labs every 2 monthes x 3.  Appended Document: Iron Infusion Scheduled & F/U Labs Scheduled CORRECTIONS TO ORIGINAL NOTE--Pt. will have f/u labs on 02-07-10, 04-09-10, 06-10-10 and 08-10-10.   Appended Document: Iron Infusion Scheduled & F/U Labs Scheduled Pt. cancelled his iron infusion, states his car broke down. He states he will call next week to reschedule it. Dr.Brodie notified.  Appended Document: Iron Infusion Scheduled & F/U Labs Scheduled Iron infusion rescheduled to 01-23-10. He will have labs done on 02-23-10, 04-25-10, 06-25-10 and 08-25-10. Pt. instructed to call back as needed.

## 2010-10-17 NOTE — Assessment & Plan Note (Signed)
Summary: ROV   Visit Type:  Follow-up Referring Provider:  Eustaquio Maize, MD Primary Provider:  Lucianne Lei, MD  CC:  Pt. states he is not taking Quinapril for about a week due to [employer] issues. Med increased to 20 mg. per Dr. Anne Hahn.  History of Present Illness: Patient got to see Dr. Juanda Chance, and has avM'S of the small bowel.  Got an IV infusion of iron.  No chest pain.  Feels good overall.  Still smoking and having trouble quitting.  Feels he is doing well at this point.    Current Medications (verified): 1)  Coreg 6.25 Mg Tabs (Carvedilol) .... Take 1 Tablet By Mouth Two Times A Day 2)  Pravastatin Sodium 20 Mg Tabs (Pravastatin Sodium) .... Take 1 Tablet By Mouth Once A Day 3)  Saw Palmetto 1000 Mg Caps (Saw Palmetto (Serenoa Repens)) .... Once A Day 4)  Quinapril Hcl 20 Mg Tabs (Quinapril Hcl) .... Take 1 Tablet By Mouth Once A Day 5)  Lisinopril 5 Mg Tabs (Lisinopril) .... Take One Tablet By Mouth Daily 6)  Aspirin 81 Mg Tbec (Aspirin) .... Take One Tablet By Mouth Daily 7)  Nitroglycerin 0.4 Mg Subl (Nitroglycerin) .... One Tablet Under Tongue Every 5 Minutes As Needed For Chest Pain---May Repeat Times Three 8)  Glipizide-Metformin Hcl 5-500 Mg Tabs (Glipizide-Metformin Hcl) .... Take 1 Tablet By Mouth Two Times A Day 9)  Fish Oil 1000 Mg Caps (Omega-3 Fatty Acids) .... Take 1 Capsule By Mouth Two Times A Day 10)  Ferrous Sulfate 325 (65 Fe) Mg Tabs (Ferrous Sulfate) .... Take One Tablet Daily  Allergies (verified): No Known Drug Allergies  Vital Signs:  Patient profile:   60 year old male Height:      72 inches Weight:      220.75 pounds BMI:     30.05 Pulse rate:   76 / minute Pulse rhythm:   regular Resp:     18 per minute BP sitting:   148 / 80  (left arm) Cuff size:   large  Vitals Entered By: Vikki Ports (Feb 08, 2010 9:43 AM)  Physical Exam  General:  Well developed, well nourished, in no acute distress. Head:  normocephalic and atraumatic Eyes:  PERRLA/EOM  intact; conjunctiva and lids normal. Lungs:  Expiratory ronchii.  No rales or dullness to percussion. Heart:  PMI non displaced.  No murmur or rub.   Extremities:  No clubbing or cyanosis. Neurologic:  Alert and oriented x 3.   Impression & Recommendations:  Problem # 1:  CAD, NATIVE VESSEL (ICD-414.01) Has CAD, DM, smokes, and therefore increased risk.   Risk factors reviewed with patient.    His updated medication list for this problem includes:    Coreg 6.25 Mg Tabs (Carvedilol) .Marland Kitchen... Take 1 tablet by mouth two times a day    Quinapril Hcl 20 Mg Tabs (Quinapril hcl) .Marland Kitchen... Take 1 tablet by mouth once a day    Lisinopril 5 Mg Tabs (Lisinopril) .Marland Kitchen... Take one tablet by mouth daily    Aspirin 81 Mg Tbec (Aspirin) .Marland Kitchen... Take one tablet by mouth daily    Nitroglycerin 0.4 Mg Subl (Nitroglycerin) ..... One tablet under tongue every 5 minutes as needed for chest pain---may repeat times three  Problem # 2:  ANEMIA (ICD-285.9) followed by Dr. Lina Sar.  See reports.  AVM of small intestine.  Problem # 3:  HYPERLIPIDEMIA (ICD-272.4) Lipid and liver profile. His updated medication list for this problem includes:    Pravastatin  Sodium 20 Mg Tabs (Pravastatin sodium) .Marland Kitchen... Take 1 tablet by mouth once a day  Patient Instructions: 1)  Your physician recommends that you continue on your current medications as directed. Please refer to the Current Medication list given to you today. 2)  Your physician wants you to follow-up in:   6 MONTHS. You will receive a reminder letter in the mail two months in advance. If you don't receive a letter, please call our office to schedule the follow-up appointment.

## 2010-10-17 NOTE — Letter (Signed)
Summary: Results Letter  [facility]  [address] [place], [place] [address]   Phone: [phone]  Fax: [phone]        Jan 15, 2010 MRN: [id number]    ARVO EALY [address] [place], [place]  [address]    Dear Mr. Kistner, The small bowl capsule endoscopy revealed severs small area of bleeding along Your small intestine ( calle avm's)These small lesions may continue to bleed over long period of time , resulting in the drop of Your Iron and redcell count.I advice that we givr You an intrvenous Iron infusion to catch up on Your Iron deficiency. We will try to set it up again for You.         Sincerely,  Hart Carwin MD  This letter has been electronically signed by your physician.  Appended Document: Results Letter I have mailed letter to patient. I have also spoken to the patient to advise him that I have rescheduled his iron infusion to Tuesday 01/23/10 @ 8 am [employer]. I have asked him to arrive NO LATER than 7:45 am for registration. I have also advised him as to how important it is for him to keep his appointment. Patient verbalizes understanding.

## 2010-10-17 NOTE — Progress Notes (Signed)
  Phone Note Outgoing Call   Summary of Call: I spoke to patient and asked him to resume Aspirin 81 mg everyday per Dr. Rosalyn Charters order. Pt verbalized understanding.     Appended Document:  I spoke with the pt and made him aware that Dr Riley Kill does NOT want him to resume ASA due to AVMs.    Appended Document:  Has chronic avms, and despite his CAD, he has proven more than once that he drops his hemoglobin.  Therefore, hold on this for now.  I our staff instruct him not to use his ASA.

## 2010-10-31 ENCOUNTER — Ambulatory Visit: Payer: Self-pay | Admitting: Cardiology

## 2010-11-15 ENCOUNTER — Ambulatory Visit: Payer: Self-pay | Admitting: Cardiology

## 2010-11-23 ENCOUNTER — Encounter: Payer: Self-pay | Admitting: Cardiology

## 2010-11-23 ENCOUNTER — Ambulatory Visit (INDEPENDENT_AMBULATORY_CARE_PROVIDER_SITE_OTHER): Payer: 59 | Admitting: Cardiology

## 2010-11-23 DIAGNOSIS — F17200 Nicotine dependence, unspecified, uncomplicated: Secondary | ICD-10-CM | POA: Insufficient documentation

## 2010-11-23 DIAGNOSIS — D649 Anemia, unspecified: Secondary | ICD-10-CM

## 2010-11-23 DIAGNOSIS — I2510 Atherosclerotic heart disease of native coronary artery without angina pectoris: Secondary | ICD-10-CM

## 2010-11-23 DIAGNOSIS — E785 Hyperlipidemia, unspecified: Secondary | ICD-10-CM

## 2010-12-04 LAB — GLUCOSE, CAPILLARY: Glucose-Capillary: 134 mg/dL — ABNORMAL HIGH (ref 70–99)

## 2010-12-13 NOTE — Assessment & Plan Note (Signed)
Summary: f8m   Referring Provider:  Eustaquio Maize, MD Primary Provider:  Lucianne Lei, MD  CC:  6 month rov.  Pt reports no complaints today. He feels he has been doing pretty good lately.  History of Present Illness: Not having any chest pain.  He is getting better about smoking, but is not there yet.  He denies progressive symptoms.  Works at [employer].  He does alot of lifting.  Problems Prior to Update: 1)  Diverticulosis, Colon  (ICD-562.10) 2)  Colonic Polyps, Benign, Hx of  (ICD-V12.72) 3)  Gerd  (ICD-530.81) 4)  Diabetes Mellitus, Type II  (ICD-250.00) 5)  Cad, Native Vessel  (ICD-414.01) 6)  Anemia  (ICD-285.9) 7)  Encounter For Long-term Use of Other Medications  (ICD-V58.69) 8)  Hyperlipidemia  (ICD-272.4) 9)  Peripheral Vascular Disease  (ICD-443.9) 10)  Myocardial Infarction, Hx of  (ICD-412) 11)  Unspecified Iron Deficiency Anemia  (ICD-280.9) 12)  Blood in Stool, Occult  (ICD-792.1)  Current Medications (verified): 1)  Coreg 6.25 Mg Tabs (Carvedilol) .... Take 1 Tablet By Mouth Two Times A Day 2)  Pravastatin Sodium 20 Mg Tabs (Pravastatin Sodium) .... Take 1 Tablet By Mouth Once A Day 3)  Saw Palmetto 1000 Mg Caps (Saw Palmetto (Serenoa Repens)) .... Once A Day 4)  Quinapril Hcl 20 Mg Tabs (Quinapril Hcl) .... Take 1 Tablet By Mouth Once A Day 5)  Lisinopril 5 Mg Tabs (Lisinopril) .... Take One Tablet By Mouth Daily 6)  Nitroglycerin 0.4 Mg Subl (Nitroglycerin) .... One Tablet Under Tongue Every 5 Minutes As Needed For Chest Pain---May Repeat Times Three 7)  Glipizide-Metformin Hcl 5-500 Mg Tabs (Glipizide-Metformin Hcl) .... Take 1 Tablet By Mouth Two Times A Day 8)  Fish Oil 1000 Mg Caps (Omega-3 Fatty Acids) .... Take 1 Capsule By Mouth Two Times A Day 9)  Ferrous Sulfate 325 (65 Fe) Mg Tabs (Ferrous Sulfate) .... Take One Tablet Daily  Allergies (verified): No Known Drug Allergies  Past History:  Past Medical History: Last updated:  09/15/2008 Myocardial infarction, hx of Hypertension Type 2 Diabetes mellitus tobacco use Peripheral vascular disease Hyperlipidemia heme positive stools  Vital Signs:  Patient profile:   60 year old male Height:      72 inches Weight:      225 pounds BMI:     30.63 Pulse rate:   75 / minute Pulse rhythm:   regular Resp:     18 per minute BP sitting:   160 / 82  (left arm)  Vitals Entered By: Kem Parkinson (November 23, 2010 4:14 PM)  Physical Exam  General:  Well developed, well nourished, in no acute distress. Head:  normocephalic and atraumatic Eyes:  PERRLA/EOM intact; conjunctiva and lids normal. Lungs:  Clear bilaterally to auscultation and percussion. Heart:  PMI non displaced.  Normal S1 and S2.  No murmur or rub.   Pulses:  pulses normal in all 4 extremities Extremities:  No clubbing or cyanosis. Neurologic:  Alert and oriented x 3.   EKG  Procedure date:  11/23/2010  Findings:      NSR.  Inferior Mi, old.    Impression & Recommendations:  Problem # 1:  ANEMIA (ICD-285.9) Had heme pos stools and evaluation by Dr. Juanda Chance.Marland Kitchen MCV is high so he should have B12.  Has not had CBC in quite some time.  On chronic iron  Problem # 2:  CAD, NATIVE VESSEL (ICD-414.01)  Continues to remain stable. His updated medication list for this  problem includes:    Coreg 6.25 Mg Tabs (Carvedilol) .Marland Kitchen... Take 1 tablet by mouth two times a day    Quinapril Hcl 20 Mg Tabs (Quinapril hcl) .Marland Kitchen... Take 1 tablet by mouth once a day    Lisinopril 5 Mg Tabs (Lisinopril) .Marland Kitchen... Take one tablet by mouth daily    Nitroglycerin 0.4 Mg Subl (Nitroglycerin) ..... One tablet under tongue every 5 minutes as needed for chest pain---may repeat times three  Orders: EKG w/ Interpretation (93000)  Problem # 3:  HYPERLIPIDEMIA (ICD-272.4) will see if labs have been done, and then will recheck if not. His updated medication list for this problem includes:    Pravastatin Sodium 20 Mg Tabs  (Pravastatin sodium) .Marland Kitchen... Take 1 tablet by mouth once a day  Patient Instructions: 1)  Your physician recommends that you continue on your current medications as directed. Please refer to the Current Medication list given to you today. 2)  Your physician wants you to follow-up in:  6 MONTHS.  You will receive a reminder letter in the mail two months in advance. If you don't receive a letter, please call our office to schedule the follow-up appointment.

## 2011-01-29 NOTE — Assessment & Plan Note (Signed)
OFFICE VISIT   TAVARI, LOADHOLT  DOB:  Dec 31, 1950                                       11/10/2007  ONGEX#:[id number]   I saw the patient in the office today for continued followup of his  bilateral lower extremity claudication.  I had originally seen him in  consultation on 04/01/2007.  He had bilateral calf claudication.  Since  I saw him last in July, he states that his symptoms have progressed  slightly.  He experiences pain in both calves associated with ambulation  and relieved with rest.  His symptoms are slightly more significant on  the left side.  He can walk about a block before experiencing symptoms.  He has had no rest pain and no history of nonhealing wounds.  He has had  no thigh or hip claudication.  Of note, he does have to walk a good  block-and-a-half from his car to his work every day.   REVIEW OF SYSTEMS:  He has had no recent chest pain, chest pressure,  palpitations, or arrhythmias.  He has had no bronchitis, asthma, or wheezing.   PHYSICAL EXAMINATION:  Blood pressure is 149/90, heart rate is 93.  I do  not detect any carotid bruits.  Lungs are clear bilaterally to  auscultation.  On cardiac exam, he has a regular rate and rhythm.  Abdomen is soft and nontender.  He has normal femoral pulses.  I cannot  palpate popliteal or pedal pulses on either side.  He has monophasic  Doppler signals in both feet.  He has no ischemic ulcers.  Both feet  appear adequately perfused.  He has no significant lower extremity  swelling.  Neurologic exam is nonfocal.   His Doppler study in our office today shows monophasic Doppler signals  in both feet with an ABI of 61% on the right and 51% on the left.  The  ABI on the right is stable.  The ABI on the left has dropped slightly.   I have again explained that, generally for claudication, we would simply  recommend a structured walking program and tobacco cessation.  Unfortunately, he does continue to  smoke a pack per day of cigarettes.  We discussed the importance of this again today.  We have also discussed  maintaining his walking program.  He did not seem really interested in  cilostazol, so I could not write him a prescription for this.  He  understands that, if his symptoms become disabling, he certainly could  proceed with arteriography to see what options we might have for  revascularization.  However, currently he feels that his symptoms are  tolerable.  I plan on seeing him back in 8 months.  He knows to call  sooner if he has problems.   Di Kindle. Edilia Bo, M.D.  Electronically Signed   CSD/MEDQ  D:  11/10/2007  T:  11/11/2007  Job:  [id number]

## 2011-01-29 NOTE — Consult Note (Signed)
VASCULAR SURGERY CONSULTATION   KAMARRION, STFORT  DOB:  1951/09/07                                       04/01/2007  ZOXWR#:[id number]   I saw Mr. Pine in the office today in consultation concerning bilateral  lower extremity claudication.  He was referred by Dr. Anne Hahn.  This is a  pleasant, 60 year old gentleman, who states that he has had bilateral  lower extremity calf claudication over the last year and a half.  His  symptoms have remained relatively stable over this time period.  He  feels that his symptoms are slightly worse on the left side.  He can  walk approximately a quarter of a block before experiencing symptoms.  His symptoms are limited to the calf.  He has no thigh or hip  claudication.  He denies any history of rest pain or non-healing ulcers.   PAST MEDICAL HISTORY:  Adult-onset diabetes.  He does not require  insulin. He denies any history of hypertension, hypercholesterolemia,  history of previous myocardial infarction, history of congestive heart  failure, or history of COPD.   FAMILY HISTORY:  His mother with a stroke at age 18, he is unaware of  any other history of premature cardiovascular disease.   SOCIAL HISTORY:  He is single.  He works at [employer].  He smokes one-  and-a-half packs per day of cigarettes and he has been smoking since he  was 60 years old.   REVIEW OF SYSTEMS AND MEDICATIONS:  Documented on the medical history  form in his chart.   PHYSICAL EXAMINATION:  VITAL SIGNS:  Blood pressure is 149/90, heart  rate is 93.  NECK:  I did not detect any carotid bruits.  LUNGS:  Clear bilaterally to auscultation.  CARDIAC:  He has a regular rate and rhythm.  ABDOMEN:  Soft and nontender, I could not palpate an aneurysm.  EXTREMITIES:  He has palpable femoral pulses.  He has diminished  popliteal pulses, I cannot pedal pulses on either side.  Both feet  appear adequately perfused.  He has no ischemic ulcers.  There is no  significant lower extremity swelling.   He has had previous Doppler studies and showed ABIs of 69% on the right  and 65% on the left.   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  I have explained that we generally not  consider arteriography and revascularization unless he developed  disabling claudication, rest pain, or a non-healing ulcer.  I have  stressed with him the importance of tobacco cessation and have given him  the number for [facility] tobacco cessation program.  We have also discussed  the importance of a structured walking program.  We also discussed  Cilostazol, however, we have elected to hold off on that for now.  I  will plan on seeing him back in six months with followup ABIs.  He knows  to call sooner if he has problems.  He understands if his symptoms  progress, the next step would be arteriography and consideration for an  infrainguinal bypass.   Di Kindle. Edilia Bo, M.D.  Electronically Signed  CSD/MEDQ  D:  04/01/2007  T:  04/02/2007  Job:  [id number]

## 2011-01-29 NOTE — Procedures (Signed)
[facility]                              EXERCISE TREADMILL   RUSS, LOOPER                       MRN:          [id number]  DATE:02/09/2008                            DOB:          1950-10-09    Duration of exercise was 2 minutes.  Maximum heart rate 118, 7% of PMHR  73%.   COMMENT:  Mr. Kalisz exercised today on the Bruce protocol.  He had an  occasional premature ventricular contraction at maximum stress, there  was not significant ST depression.  There were occasional bigeminal PVCs  and a rare couplet.  The patient also had one triplet.   The patient has very limited exercise tolerance.  He unfortunately  continues to smoke.  He is followed by Dr. Anne Hahn.  He also has had a  drop in his hemoglobin and is now off of Plavix.  He has multivessel  disease and somewhat limited exercise tolerance.  He has also had a  recent anemia with 4 out of 6 stools positive, low iron and ferritin  levels.  His iron saturation is quite low.  He is now off of Plavix, but  fortunately he had non drug-eluting stents placed in the right coronary.  The current study does not suggest high-grade ischemia and yet at the  present time, he has very limited exercise tolerance.  He is scheduled  to undergo endoscopy.  This will include upper as well as lower  endoscopy because of the suggestion of GI bleed.  He has been on iron  supplement, unfortunately, his hemoglobin has come up with the latest  laboratory studies by Dr. Juanda Chance.   PLAN:  We will see him back in followup in about 4 weeks at which time  we will reassess his entire situation given his underlying coronary  anatomy and need for antiplatelet therapy.  We will also get a lipid and  liver profile.     Arturo Morton. Riley Kill, MD, [facility]  Electronically Signed    TDS/MedQ  DD: 02/09/2008  DT: 02/09/2008  Job #: [id number]

## 2011-01-29 NOTE — Assessment & Plan Note (Signed)
[facility]                            CARDIOLOGY OFFICE NOTE   [name], Clayton                       MRN:          [id number]  DATE:12/28/2007                            DOB:          1951/01/23    [name] is in for followup.  From a clinical standpoint, he is doing  reasonably well.  The patient presented with a fairly sizeable  myocardial infarction.  He had total occlusion of the RCA with left-to-  right collaterals.  He underwent percutaneous intervention using three  Liberte stents.  He has done well since that time.  He is not having any  more trouble although he is back at work and does a moderate amount of  lifting.  He has been seen in consultation previously by Dr. Cari Caraway, and does have evidence of bilateral peripheral vascular  disease.  He has not been having any chest pain since discharge from the  hospital and he has been taking his medicines as well.  There is a  moderate drop in left ABI consistent with moderately severe arterial  insufficiency.  Unfortunately, since discharge from hospital the patient  continues to smoke.  He is smoking less, but nonetheless continues to  smoke.   MEDICATIONS:  1. Coreg 6.25 mg p.o. b.i.d.  2. Aspirin 325 mg daily.  3. Plavix 75 mg daily.  4. Metformin 500 mg p.o. b.i.d.  5. Simvastatin 80 mg at bedtime.   PHYSICAL EXAMINATION:  VITAL SIGNS:  Blood pressure is 138/80, pulse is  88.  LUNGS:  Lung fields are clear.  CARDIAC:  The cardiac rhythm is regular.  There is an S4 gallop.  ABDOMEN:  Groin is well-healed.   LABORATORY DATA:  Electrocardiogram demonstrates normal sinus rhythm.  There are small Q waves in inferior leads which were not thought to be  significant.   IMPRESSION:  1. Recent myocardial infarction with successful percutaneous stenting      of the right coronary artery with three non-drug-eluting stent      platforms.  2. Probable hypercholesterolemia.  3.  History of tobacco.  4. Peripheral vascular disease.  5. Type 2 diabetes.   PLAN:  1. Return to clinic in 2-3 weeks for exercise treadmill.  2. Continue current medical regimen.  3. Lipid liver profile.  4. Consideration of patient with __________  study.     Clayton [name]. Riley Kill, MD, [facility]  Electronically Signed    TDS/MedQ  DD: 12/28/2007  DT: 12/28/2007  Job #: [id number]

## 2011-01-29 NOTE — Cardiovascular Report (Signed)
NAME:  Clayton [name], BOGART NO.:  [id number]   MEDICAL RECORD NO.:  [id number]          PATIENT TYPE:  INP   LOCATION:  2101                         FACILITY:  [facility]   PHYSICIAN:  Jonelle Sidle, MD DATE OF BIRTH:  19-Aug-1951   DATE OF PROCEDURE:  DATE OF DISCHARGE:                            CARDIAC CATHETERIZATION   REQUESTING CARDIOLOGIST:  Dr. Dietrich Pates.   PRIMARY CARE PHYSICIAN:  Dr. Lucianne Lei.   INDICATIONS:  [name] is a pleasant 60 year old male with a history of  type 2 diabetes mellitus, ongoing tobacco use, and presentation with  evidence of a non-ST-elevation myocardial infarction with symptom onset  in stuttering pattern over the last 24 hours.  His electrocardiogram  shows small inferior Q waves and his CK-MB and troponin I levels are  abnormal.  He is referred now for a diagnostic cardiac catheterization  to clearly assess the coronary anatomy and assess for any  revascularizations options.  The potential risks and benefits were  explained to him in advance and informed consent was obtained.   PROCEDURES PERFORMED:  1. Left heart catheterization.  2. Selective coronary angiography.  3. Left ventriculography.   ACCESS AND EQUIPMENT:  The area about the right femoral artery was  anesthetized with 1% lidocaine.  With the use of a Doppler needle,  access was obtained in the right femoral artery via the modified  Seldinger technique.  A Wholey wire was required.  A 6-French sheath was  placed and standard 6-French JL-4 and JR-4 catheters were used for  selective coronary angiography.  An angled pigtail catheter was used for  left heart catheterization and left ventriculography.  All exchanges  were made over a wire.  The patient received 110 mL of Omnipaque.  There  were no immediate complications.   HEMODYNAMIC RESULTS:  Aorta 95/83 mmHg.  Left ventricle 94/22 mmHg.   ANGIOGRAPHIC FINDINGS:  1. Left main coronary artery is free of  significant flow-limiting      coronary atherosclerosis and gives rise to the left anterior      descending and circumflex vessels.  2. Left anterior descending is a medium-caliber vessel that extends      down to the apex.  There are 3 diagonal branches.  Within the      proximal portion of the vessel distal to the first diagonal branch      is an area of 40% to 50% stenosis followed by an area of 30%      stenosis in the midvessel.  In the distal vessel there is an area      of 30% diffuse stenosis.  Flow was TIMI-3 in this vessel.  3. The circumflex vessel is medium in caliber.  There are 4 obtuse      marginal branches.  The first branch is the largest.  Within this      branch is an area of relatively focal 60% to 70% stenosis      surrounded by an area of approximately 50% to 60% stenosis in      diffuse fashion.  Distal to this is an area  of 50% more focal      stenosis.  4. Otherwise, there are relatively mild luminal irregularities to      approximately 20% in the circumflex vessel and an area of 30%      stenosis within the fourth obtuse marginal branch.  5. Right coronary artery is occluded in the proximal to midvessel      segment.  There are faint left-to-right collaterals that fill a      portion of the distal vessel, although the remainder of the vessel      is not well seen.   LEFT VENTRICULOGRAPHY:  Performed in the RAO projection and reveals an  ejection fraction of approximately 55% with mid to basal inferior  akinesis and trace mitral regurgitation.   DIAGNOSES:  1. Coronary atherosclerosis as outlined including an occluded proximal      to mid right coronary artery associated with faint left-to-right      collateral filling of the distal vessel.  There is otherwise      moderate left system disease including 60% to 70% stenosis      involving the obtuse marginal and 40% to 50% stenosis in the      proximal left anterior descending.  2. Left ventricular ejection  fraction of approximately 55% with mid to      basal inferior akinesis, trace mitral regurgitation and left      ventricular end-diastolic pressure of 22 mmHg.   DISCUSSION:  I reviewed the results with the patient and also discussed  the films with Dr. Riley Kill.  At this point, I anticipate percutaneous  intervention to treat the right coronary artery and otherwise medical  therapy.      Jonelle Sidle, MD  Electronically Signed     SGM/MEDQ  D:  11/19/2007  T:  11/20/2007  Job:  [id number]

## 2011-01-29 NOTE — Assessment & Plan Note (Signed)
OFFICE VISIT   Clayton [name], Clayton [name]  DOB:  [birth year]                                       07/12/2008  ZOXWR#:[id number]   I saw the patient in the office today for continued followup of his  claudication.  When I last saw him in February of 2009 with stable  bilateral lower extremity claudication.  Since I saw him last he did  have an myocardial infarction and underwent PTCA by Dr. Riley Kill.  He was  unable to take Plavix but is on aspirin.  His claudication symptoms have  been stable.  He experiences pain in both calves associated with  ambulation and relieved with rest.  His symptoms are equal on both  sides.  He has had no thigh or hip claudication.  He states that his  symptoms occur at a variable distance.  Overall, he thinks his symptoms  have improved slightly.  He has had no rest pain and no history of  nonhealing ulcers.   REVIEW OF SYSTEMS:  CARDIOVASCULAR:  He has had no recent chest pain,  chest pressure, palpitations or arrhythmias.  RESPIRATORY:  He had no bronchitis, asthma or wheezing.   PHYSICAL EXAMINATION:  Blood pressure is 147/85, heart rate is 73.  Neck  is supple.  There is no cervical lymphadenopathy.  I do not detect any  carotid bruits.  Lungs are clear bilaterally to auscultation.  Cardiac  exam, he has a regular rate and rhythm.  Abdomen:  Soft and nontender.  He has normal femoral pulses.  I cannot palpate popliteal or pedal  pulses on either side.  Does have monophasic Doppler signals in both  feet.  No ischemic ulcers.  Both feet appear adequately perfused.  He  has no significant lower extremity swelling.   Doppler study in our office today shows an ABI of 64% on the right and  58% on the left, and these are stable compared to his previous ABIs.  I  think it is safe to stretch his followup out to 1 year and I will see  him back at that time.  He knows to call sooner if he has problems.  We  have again discussed the importance  of tobacco cessation as he continues  to smoke less than a pack per day of cigarettes.  I have also encouraged  him to stay as active as possible and to try to get on a structured  walking program.   Di Kindle. Edilia Bo, M.D.  Electronically Signed   CSD/MEDQ  D:  07/12/2008  T:  07/13/2008  Job:  [id number]

## 2011-01-29 NOTE — Assessment & Plan Note (Signed)
[facility]                            CARDIOLOGY OFFICE NOTE   YISHAI, REHFELD                       MRN:          [id number]  DATE:03/09/2008                            DOB:          1951/05/26    Mr. Schlabach is in for followup.  In general, he has been stable.  He  underwent endoscopy.  This was done by Dr. Lina Sar.  He apparently  had a small diminutive polyp in the colon.  He also had some  inflammation of the distal esophagus.  None of this accounted for the  heme-positive stools.  He has had followup CBC done by Dr. Juanda Chance,  although we do not have access to these numbers.  The patient was called  this morning and told that things were better, that he needed a repeat  in a month or two.  In general, he has done well off his Plavix.  He is  now on only a baby aspirin a day.  Also, his glipizide has been added  back to his regimen because his glucose was poorly controlled.   On physical examination, he is alert, oriented, and in no distress.  The  blood pressure is 144/80.  The pulse is 84.  The lung fields are clear,  and the cardiac rhythm is regular.   I have talked with him at great length.  He needs to stop smoking and  with diabetes, and thus he is at great risk for recurrent cardiac  events.  He is currently on iron supplementation.  We will see him back  in followup in about 3 months and reassess his status.  I have  encouraged him to stop smoking.     Arturo Morton. Riley Kill, MD, [facility]  Electronically Signed    TDS/MedQ  DD: 03/09/2008  DT: 03/10/2008  Job #: [id number]

## 2011-01-29 NOTE — Consult Note (Signed)
NAME:  Clayton [name], Clayton [name] NO.:  [id number]   MEDICAL RECORD NO.:  [id number]          PATIENT TYPE:  EMS   LOCATION:  MAJO                         FACILITY:  [facility]   PHYSICIAN:  Hedwig Morton. Juanda Chance, MD     DATE OF BIRTH:  Dec 14, 1950   DATE OF CONSULTATION:  01/22/2008  DATE OF DISCHARGE:  01/22/2008                                 CONSULTATION   CHIEF COMPLAINT:  The patient was sent to the emergency room for  evaluation of 4/6 heme-positive stool cards and history of anemia.   HISTORY OF PRESENT ILLNESS:  Clayton [name] is a pleasant 60 year old African  American man.  He has a history of peripheral vascular disease and a  history of MI, and he was described as having had small nonsignificant Q-  waves when he had his heart attack on November 18, 2007.  He had a cardiac  cath associated with that MI; at which time, 3 Liberte that is non-drug-  eluting stents, placed into the RCA.  The patient returned to work  within a week, and his work does involve lifting of boxes that sometimes  weigh more than 50 pounds.  He was discharged and continues to take 75  mg of Plavix daily, 325 mg of aspirin daily, his cholesterol and cardiac  meds. The patient has had stable lack of energy.  No weight loss.  Appetite is preserved.  Stools have been mostly brown and occur daily.  He has noted no gross blood, although there is some questionable  occasional dark stools.   The patient had been noted to be anemic with normal MCV when he was in  the hospital in March 2009, and his hemoglobin then was 11.1.  On December 30, 2007, when he was seen back at Dr. Rosalyn Charters office, the hemoglobin  was 10.5 and the MCV had gone from 91 to 90.  The patient was sent home  with multiple stool cards to be smeared with specimen and returned.  The  patient returned to the [facility] on 2 occasions; once on January 12, 2008, when he had iron studies, which will be listed below, but were  supporting diagnosis of iron  deficiency anemia.  On Jan 22, 2008, he  submitted his stool specimens and 4 out of 6 of these were heme  positive.  The patient says he collected these over a period of about 4  days during the week before he dropped the specimens off at the office.  Again, he has not had any striking gross blood, melena, hematochezia, or  hematemesis.  He has never undergone colonoscopy or upper endoscopy.  He  denies history of ulcer disease.  He has not used any extra aspirin, and  he does not use any nonsteroidals.  His GI review of systems is  unremarkable.   ALLERGIES:  None.   MEDICATIONS:  1. Coreg 6.25 mg twice daily.  2. Aspirin 325 mg once daily.  3. Plavix 75 mg once daily.  4. Metformin 500 mg twice daily.  5. Simvastatin 80 mg nightly.   He does have  prescriptions for Viagra and Levitra, but he has not used  either of these in over a month.  Occasionally, he takes a supplement,  which is some sort of libido enhancer called horny goat weed, and he  takes saw palmetto supplement 2 per day when he remembers to take it,  but this is may be a few times a week but not daily.   SOCIAL HISTORY:  The patient is single.  He lives in [place].  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  He is still smoking albeit  less than his previous pack and half a day.  Current smoking is less  than 1 pack per day.  He does not drink alcohol.  Does not use illicit  drugs.   FAMILY HISTORY:  Prostate cancer in his father and brothers.  Stroke in  his father.  Coronary artery disease in his parents.   PAST MEDICAL HISTORY:  1. Non-insulin-dependent diabetes mellitus type 2.  2. Coronary artery disease with MI and stents to the RCA in early      March 2009.  3. Hyperlipidemia.  4. 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  5. He has decreased ankle-brachial indexes bilaterally as confirmed by      vascular service in February 2009.   REVIEW OF SYSTEMS:  NEUROLOGIC:  No headaches.  No  syncope.  No blurry  vision.  CARDIOVASCULAR:  No chest pain.  No palpitations.  No edema.  PULMONARY:  No shortness of breath.  No cough.  GENERAL:  No weight  loss.  GI:  No nausea, vomiting, or heartburn.  GENITOURINARY:  No  nocturia.  No unusual pain or discharge.  ENDOCRINE:  No polydipsia.  No  sweats.  He says his sugars run around 80 when he does check them, and  but it sounds like he does not check his blood sugars very often.  Also,  the patient says that he has not had prior rectal or prostate exam by  his primary care doctor.  He has been trying to follow a better diet,  but he still does not really stick to the low fat part of his diet.  Otherwise, the review of systems is negative.   PHYSICAL EXAMINATION:  GENERAL:  The patient is a pleasant, somewhat  overweight African American gentleman who looks well.  He is in no  distress.  VITAL SIGNS:  Blood pressure is 158/97, pulse is 82, respirations are  83, temperature is 99.4, and saturations on room air 98%.  HEENT:  Sclerae are nonicteric.  Conjunctivae are pink.  Extraocular  movements are intact.  Oropharynx, mucosa is moist and clear.  Dentition  in good repair.  NECK:  No masses.  No thyromegaly.  No JVD.  PULMONARY:  Lungs are clear to auscultation and percussion bilaterally.  There is no cough.  There is no dyspnea.  CARDIOVASCULAR:  Regular rate and rhythm.  No murmurs, rubs, or gallops.  GI:  Soft, nondistended, and nontender with active bowel sounds.  RECTAL:  He is fecal occult blood negative.  No masses.  Prostate was  suboptimally examined.  EXTREMITIES:  No cyanosis, clubbing, or edema.  NEUROLOGIC:  He is alert and oriented x3.  He walked into the emergency  room without assistance.  There is no gait disorder.  There is no  tremor.  DERMATOLOGIC:  No tattoos.  No rashes.  No angiomata.   LABORATORY DATA:  Hemoglobin is 10.7, hematocrit is 33, white blood cell  count 8.2, platelets 346,000, and MCV 85.  PT  13.3, INR 1.0, and PTT 40.  Sodium 140, potassium 4.2, and chloride 104.  CO2 not obtained in the  quick analysis in the ER.  BUN is 9, creatinine 0.9, and glucose 182.  Iron 22, iron saturations 4.9, transferrin 321, and ferritin 5.9; those  labs are from January 12, 2008.  B12 level is 572 and folate level 7.7.   IMPRESSION:  1. Normocytic anemia but with low ferritin and iron studies.  The      patient has a diagnosis of iron deficiency anemia.  He is      intermittently fecal occult blood positive, but he has not had      overt bleeding.  The patient is on Plavix and aspirin, secondary to      recent MI and placement of non-drug-eluting stents.  The patient      needs to be screened for ulcer disease, neoplasia, polyps, etc.      Right now, he is stable.  We have had a discussion with Dr.      Rosalyn Charters representative, Nicolasa Ducking, nurse practitioner,      who states that it will be okay for him to hold his Plavix and      reduce the dose of aspirin to 81 mg until he is able to undergo his      colon and EGD evaluation.  2. Atherosclerotic peripheral vascular disease.  3. Ongoing tobacco abuse.  4. Hypertension.  5. Diabetes mellitus, non-insulin, but oral agent requiring.   PLAN:  1. Colon and EGD is set up for Feb 09, 2008, at 2:30 at the [facility].  He will have a pre-visit with the nurse at      [facility] on Jan 26, 2008, at 8:30 a.m.  2. Hold aspirin and reduce his aspirin to 81 mg daily.  Prilosec OTC      has been added, and he is to take this once in the morning.  Iron      sulfate 325 mg once daily has also been added and a prescription      given to the patient.  All instructions were discussed with the      patient who is intelligent and able to follow instructions.  He is      going to be released from the emergency room today, and will follow      up in the GI office as outlined above.      Jennye Moccasin, PA-C      Hedwig Morton. Juanda Chance, MD   Electronically Signed    SG/MEDQ  D:  01/22/2008  T:  01/23/2008  Job:  [id number]   cc:   Arturo Morton. Riley Kill, MD, [facility]  Dorise Hiss, M.D.

## 2011-01-29 NOTE — Assessment & Plan Note (Signed)
OFFICE VISIT   Clayton [name], Clayton [name]  DOB:  [birth year]                                       07/06/2009  ZOXWR#:[id number]   I saw this patient in the office today for continued follow up of his  peripheral vascular disease.  I last saw him a year ago in October 2009  at which time he had stable bilateral lower extremity claudication.  His  pain was mostly limited to the calves with no significant thigh or hip  claudication.  We have encouraged him to continue on a structured  walking program and also have discussed tobacco cessation.  He comes in  for a 1-year follow-up visit.   Since I saw him last he actually states that his symptoms have improved  gradually.  He is able to walk at least 50 yards before experiencing  symptoms.  He experiences symptoms in the calves, more so on the right  than the left.  These symptoms are brought on by ambulation and relieved  with rest.  He has had no thigh or hip claudication.  He has had no rest  pain or nonhealing ulcers.  His only other complaint is his feet feel  cold at times.   PAST MEDICAL HISTORY:  In addition is significant for diabetes which has  been stable on his current medications and hypertension which has also  been stable on his current medications.  He denies any history of  previous congestive heart failure or COPD.  I believe he did have a  small myocardial infarction in the past and underwent PTCA by Dr.  Riley Kill.   FAMILY HISTORY:  There is no history of premature cardiovascular  disease.   SOCIAL HISTORY:  He smokes less than a pack per day of cigarettes and  has been smoking for 40 years.  He has been trying to gradually  decrease.  He does not drink alcohol on a regular basis.   REVIEW OF SYSTEMS:  He has had no recent weight loss, weight gain or  fever.  CARDIAC:  He had no chest pain, chest pressure, palpitations or  arrhythmias.  PULMONARY:  He had no productive cough bronchitis, asthma or  wheezing.  GI, GU, neurologic and hematologic review of systems is unremarkable.   PHYSICAL EXAMINATION:  This is a pleasant 60 year old gentleman who  appears his stated age.  Blood pressure 95/60, heart rate is 73,  temperature 97.9.  Neck:  Supple.  There is no cervical lymphadenopathy.  There is no JVD.  I do not detect any carotid bruits.  Lungs:  Clear  bilaterally to auscultation.  There are no rales, rhonchi or wheezing.  Cardiac:  He has a regular rate and rhythm without murmur appreciated.  He has no significant peripheral edema.  Vascular reveals palpable  femoral pulses bilaterally.  I cannot palpate popliteal or pedal pulses  on either side.  Both feet are warm and well-perfused without ischemic  ulcers.  Neurologic exam is nonfocal with no focal weakness or  paresthesias.   The patient did not want to have follow-up Doppler studies today and I  think this is reasonable given that his symptoms have improved.  I have  explained I think he has bilateral infrainguinal arterial occlusive  disease and again we have discussed the importance of tobacco cessation  and remaining on a structured walking  program.  If his symptoms  progressed or he develops significant rest pain or nonhealing ulcer,  then he should be evaluated for arteriography; however, his symptoms  appear to be improving and therefore we will see him back on a p.r.n.  basis.  We will be happy to see him at any time if any new vascular  issues arise or his claudication symptoms progress.   Di Kindle. Edilia Bo, M.D.  Electronically Signed   CSD/MEDQ  D:  07/06/2009  T:  07/07/2009  Job:  [id number]   cc:   Dorise Hiss, M.D.

## 2011-01-29 NOTE — Procedures (Signed)
LOWER EXTREMITY ARTERIAL EVALUATION-SINGLE LEVEL   INDICATION:  Bilateral calf claudication at approximately 1-1/2 blocks,  left sooner than right.  The patient states his pain has worsened  bilaterally since his last exam on 03/06/2007.   HISTORY:  Diabetes:  Yes.  Cardiac:  No.  Hypertension:  No.  Smoking:  Yes, 1 pack per day.  Previous Surgery:  No.   RESTING SYSTOLIC PRESSURES: (ABI)                          RIGHT                LEFT  Brachial:               140                  146  Anterior tibial:        82                   72  Posterior tibial:       86 (0.61)            74 (0.51)  Peroneal:  DOPPLER WAVEFORM ANALYSIS:  Anterior tibial:        Monophasic           Monophasic  Posterior tibial:       Biphasic             Biphasic  Peroneal:   PREVIOUS ABI'S:  Date: 03/06/2007  RIGHT:  0.69  LEFT:  0.65   IMPRESSION:  1. Fairly stable right ABI.  2. Moderated drop in left ABI consistent with moderate/moderate-severe      arterial insufficiency.   ___________________________________________  Di Kindle. Edilia Bo, M.D.   PB/MEDQ  D:  11/10/2007  T:  11/11/2007  Job:  [id number]

## 2011-01-29 NOTE — H&P (Signed)
NAME:  OTHEL, HOOGENDOORN NO.:  [id number]   MEDICAL RECORD NO.:  [id number]          PATIENT TYPE:  EMS   LOCATION:  MAJO                         FACILITY:  [facility]   PHYSICIAN:  Rod Holler, MD     DATE OF BIRTH:  Sep 10, 1951   DATE OF ADMISSION:  11/18/2007  DATE OF DISCHARGE:                              HISTORY & PHYSICAL   PRIMARY CARE PHYSICIAN:  Dr. Lucianne Lei.   CHIEF COMPLAINT:  Chest pain.   HISTORY OF PRESENT ILLNESS:  Mr. Marcello is a pleasant 61 year old male  with history tobacco use, family history of coronary disease, diabetic  who presented to the emergency department with chest pain.  Today, after  he ate lunch, the patient had onset of substernal chest discomfort that  he thought was heartburn.  He described it as a heaviness with no  radiation.  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  He had no  associated symptoms including nausea, diaphoresis, shortness of breath.  At work, his coworkers were concerned about him and called EMS.  In the  emergency department, the patient received nitroglycerin, nitroglycerin  drip, Protonix, along with heparin.  He has had no recent PND or  orthopnea, no lower extremity swelling, no syncope or presyncope.  Currently, the patient is chest painfree.   PAST MEDICAL HISTORY:  1. Type 2 diabetes.  2. Tobacco use.  3. Peripheral vascular disease.   MEDICATIONS:  1. Lisinopril, unknown dose.  2. Metformin unknown dose.   ALLERGIES:  NO KNOWN DRUG ALLERGIES.   SOCIAL HISTORY:  The patient is married, smokes one pack per day, works  loading trucks.   FAMILY HISTORY:  Mother and father both with coronary artery disease in  their 67s.   REVIEW OF SYSTEMS:  All systems reviewed in detail and are negative  except as noted in history of present illness.   PHYSICAL EXAMINATION:  VITAL SIGNS:  Temperature 98.4, heart rate 86,  respiratory rate 20, blood pressure 131/72, oxygen saturation 96% on 2  liters  nasal cannula.  GENERAL:  Well-developed, well-nourished anxious-appearing male, alert,  oriented x3, in no apparent distress.  HEENT: Atraumatic, normocephalic, pupils equal, round, react to light,  extraocular movements intact.  Oropharynx clear.  NECK: Supple.  No adenopathy, no JVD, no carotid bruits.  CHEST:  Lungs with equal bilateral breath sounds, faint end-expiratory  wheezing, no rhonchi.  CARDIOVASCULAR:  Regular rhythm, normal rate, normal S1-S2, no murmurs,  rubs or gallops, faint to 1 plus bilateral peripheral pulses.  ABDOMEN:  Soft, nontender, nondistended.  Active bowel sounds.  EXTREMITIES: No clubbing, cyanosis or edema.  NEUROLOGIC:  No focal deficits.   Chest x-ray:  Scarring noted in the left lower lobe lingula, borderline  heart size.  EKG shows normal sinus rhythm, inferior lateral ST-segment  depression.   LABORATORY DATA:  Sodium 137, potassium 4.8, chloride 104, BUN 10,  creatinine 1, glucose 270.  White blood cell count 8.3, hematocrit 41.8,  platelet count 316.  CK-MB of 17, 31.9, troponin 0.79, 0.42.   IMPRESSION:  A 60 year old male with a history  of diabetes, tobacco use,  family history of coronary artery  disease who presents with a non-ST-  elevation myocardial infarction (MI).   PLAN:  1. Cardiovascular: Admit the patient to a step-down bed, serial      cardiac enzymes, aspirin daily, Lipitor daily, Lopressor b.i.d.,      lisinopril daily, continue heparin drip, integralin bolus and drip,      daily EKGs, BNP in the morning.  2. Endocrine:  Sliding scale insulin, hemoglobin A1c in the morning,      hold metformin.  3. Fluids, electrolytes nutrition:  Diabetic diet, n.p.o. after      midnight, CMP, magnesium in the morning.  4. Coagulation panel in the morning.  5. Tobacco cessation consult.      Rod Holler, MD  Electronically Signed     TRK/MEDQ  D:  11/18/2007  T:  11/19/2007  Job:  [id number]

## 2011-01-29 NOTE — Cardiovascular Report (Signed)
NAME:  Clayton [name], ROMER NO.:  [id number]   MEDICAL RECORD NO.:  [id number]          PATIENT TYPE:  INP   LOCATION:  2904                         FACILITY:  [facility]   PHYSICIAN:  Arturo Morton. Riley Kill, MD, FACCDATE OF BIRTH:  [birth year]   DATE OF PROCEDURE:  11/19/2007  DATE OF DISCHARGE:                            CARDIAC CATHETERIZATION   INDICATIONS:  The patient was admitted with ongoing chest pain.  He has  had stuttering pain for the last 24 hours.  Catheterization by Dr.  Diona Browner suggested total occlusion of the right coronary artery.  There  were collaterals from the left to the right with preserved LV function  with moderate hypokinesis.  As a result, percutaneous intervention was  recommended.   PROCEDURE:  Percutaneous stenting of the right coronary artery x3.   DESCRIPTION OF PROCEDURE:  The patient had an indwelling catheter done  by Dr. Gala Romney.  The patient was on an Integrilin drip.  Heparin and a  second bolus of Integrilin were given according to protocol.  With an  adequate ACT, a JR-4 guiding catheter was utilized and a wire was placed  down the distal vessel.  We initially passed a 2 mm artery and were able  to reinstitute a 2 mm balloon and reestablished flow with this and then  repositioned the wire into the distal right coronary.  In doing so, it  was obvious that the vessel was severely diseased over a long segment.  There is also fairly diminished distal circulation.  As a result of  this, despite the patient's diabetes.  We elected to use non drug-  eluting stents given the small size of the downstream circulation.  We  first placed a 2.75 x 32 [employer] Liberte stent.  This was  inflated to about 14 atmospheres.  Following this, a second 2.75 of 24  was placed more proximally followed by a 28-mm stent.  All of these were  then postdilated using a 3.25-mm Quantum Maverick balloon.  There was  marked improvement in the appearance of  the artery.  After post  dilatation, there was some diminished runoff, but we then gave  intracoronary verapamil 100 mcg, and the distal flow normalized.  The  final angiographic result resulted in restoration of TIMI III flow to  the distal vessel with relatively good runoff.  There was excellent post  dilatation using the 3.25 Quantum Maverick balloon.  The long area of  total occlusion resulted in restoration to a relatively small posterior  descending and posterolateral system.  There also appeared to be a small  diffusely diseased posterior descending branch.   CONCLUSION:  Recent inferior wall myocardial infarction with total  occlusion of the right coronary artery with restoration of TIMI III flow  using three overlapping non drug-eluting stent platforms into a small  caliber distal vessel.   DISPOSITION:  The patient be treated with aspirin and Plavix.  A long-  term follow-up will likely be with Dr. Tenny Craw.      Arturo Morton. Riley Kill, MD, [facility]  Electronically Signed     TDS/MEDQ  D:  11/19/2007  T:  11/20/2007  Job:  [id number]   cc:   Jonelle Sidle, MD  CV Laboratory  Pricilla Riffle, MD, [facility]

## 2011-01-29 NOTE — Discharge Summary (Signed)
NAME:  Clayton [name], Clayton [name] NO.:  [id number]   MEDICAL RECORD NO.:  [id number]          PATIENT TYPE:  INP   LOCATION:  2039                         FACILITY:  [facility]   PHYSICIAN:  Jesse Sans. Wall, MD, FACCDATE OF BIRTH:  [birth year]   DATE OF ADMISSION:  11/18/2007  DATE OF DISCHARGE:  11/22/2007                               DISCHARGE SUMMARY   FINAL DISCHARGE DIAGNOSES:  1. Coronary artery disease.      a.     Status post inferior wall myocardial infarction.      b.     Status post cardiac catheterization revealing occluded mid       right coronary artery with faint left-to-right collaterals and       moderate left ventricular system disease.      c.     Status post percutaneous coronary intervention to the right       coronary artery with three Liberty drug-eluting stents decreasing       from 100% to 0% post dilatation.  2. Type 2 diabetes.  3. Tobacco abuse.  4. Peripheral vascular disease.   PRIMARY CARDIOLOGIST:  Arturo Morton. Riley Kill, MD.   PRIMARY CARE PHYSICIAN:  Dorise Hiss, MD.   PROCEDURES PERFORMED DURING HOSPITALIZATION:  1. Cardiac catheterization completed by Dr. Simona Huh on November 18, 2007, revealing total occlusion of the right coronary artery with      collaterals from left-to-right with preserved LV function and      moderate hypokinesis.  2. PTCA and stenting of the right coronary artery x3 using a Liberty      non-drug-eluting stent per Dr. Bonnee Quin.   HOSPITAL COURSE:  This is a 60 year old, Caucasian male with history of  tobacco abuse and family history of coronary artery disease and  diabetes, who presented to the emergency department with complaints of  chest pain after he had eaten lunch.  It was substernal that he thought  was heartburn, but he also described it as heaviness with no radiation.  He took Tums with no relief.  He had associated no other associated  symptoms.  Secondary to his symptoms, his co-workers became  concerned  and they called EMS.  In the emergency department, the patient received  nitroglycerin and started on a drip, Protonix along with heparin, and  was admitted to rule out myocardial infarction.  The patient's cardiac  enzymes were cycled and found to be positive with a troponin of 0.79 and  0.42.  The patient was subsequently scheduled for cardiac  catheterization per Dr. Diona Browner.  On November 19, 2007, Dr. Diona Browner  completed cardiac catheterization with results described above.  Please  see Dr. Ival Bible thorough cardiac catheterization note for more  details.  It was found that the patient would be a candidate for  percutaneous intervention, and therefore Dr. Bonnee Quin performed PCI  of the right coronary artery using three bare metal stents to the right  coronary artery.  The patient was placed on Integrilin and heparin and  recovered in ICU.  After Integrilin was discontinued, the patient  was  allowed up in the room and in a chair to initiate ambulation.  The  patient was also counseled on smoking cessation.  He was placed on  sliding scale insulin secondary to elevated blood glucose, and  Metformin, which he was taking at home, was held.   The patient was subsequently moved to stepdown unit where he remained  stable and recovered well, he was up walking around with Cardiac Rehab,  and had no recurrence of chest discomfort.  The patient was seen and  examined by Dr. Juanito Doom, and the day of discharge blood pressure was  mildly low at 98 to 121 systolic and 64 to 72 diastolic.  Secondary to  this, the patient's Lisinopril, which he had been placed on 10 mg daily,  was held and will be restarted per Dr. Riley Kill at his discretion on  followup appointment.  In the interim, the patient was started on Coreg  6.25 mg twice a day, Plavix 75 mg, aspirin, and was also started on  Zocor 80 mg one p.o. daily.  It has been advised that the patient  restart his Metformin at home and to  have followup appointment with his  primary care physician.   DISCHARGE LABORATORY DATA:  Hemoglobin 11.1, hematocrit 31.6, white  blood cells 7.2, platelets 204.  Sodium 137, potassium 3.6, chloride  105, CO2 27, glucose 156, BUN 8, creatinine 0.79.  Hemoglobin A1c was  found to be 6.6.  TSH 0.644.  Troponin's throughout hospitalization were  trending downward.  The patient's troponin was 3.12, 6.53, and 2.   DISCHARGE MEDICATIONS:  1. Coreg 6.25 mg twice a day.  2. Aspirin 325 mg daily.  3. Plavix 75 mg daily.  4. Xanax 0.25 mg three times a day.  5. Metformin as taken at home.  6. Nitroglycerin 0.4 mg under the tongue as needed for chest pain.  7. Zocor 80 mg p.o. q.h.s.   ALLERGIES:  No known drug allergies.   FOLLOWUP PLANS AND APPOINTMENTS:  1. The patient is to follow with Dr. Bonnee Quin; office will call for      appointment in the next two to three weeks.  2. The patient is to follow with Dr. Anne Hahn, primary care physician,      for continued medical management with emphasis on diabetes.  3. The patient has been given post cardiac catheterization      instructions with particular emphasis on the right groin site for      evidence of bleeding, hematoma, or an infection.  4. The patient has been given smoking cessation instructions.  5. The patient will follow up in Outpatient Cardiac Rehab.      Bettey Mare. Lyman Bishop, NP      Jesse Sans. Daleen Squibb, MD, [facility]  Electronically Signed    KML/MEDQ  D:  11/22/2007  T:  11/22/2007  Job:  [id number]   cc:   Dorise Hiss, M.D.

## 2011-01-29 NOTE — Assessment & Plan Note (Signed)
[facility]                            CARDIOLOGY OFFICE NOTE   Clayton [name], Clayton [name]                       MRN:          [id number]  DATE:07/07/2008                            DOB:          23-Nov-1950    [name] is in for a followup visit.  He unfortunately continues to  smoke about a pack of cigarettes a day, and I have had extensive  discussions with him.  The patient has previously undergone percutaneous  stenting of the right coronary artery in March 2009.  At that time, he  had a Environmental manager Liberte stent.  He then had some GI bleeding.  He has a diminutive polyp in the colon and some inflammation in the  distal esophagus.  He now remains on just an 81 mg of aspirin a day and  is getting along reasonably well, but unfortunately does continue to  smoke.   MEDICATIONS:  1. Coreg 6.25 mg b.i.d.  2. Metformin hydrochloride 500 mg b.i.d.  3. Simvastatin 80 mg nightly.  4. Saw palmetto.  5. Multivitamin with iron.  6. Lisinopril 5 mg daily.  7. Glipizide 5 mg p.o. b.i.d.  8. Aspirin 81 mg daily.   Most recent laboratory studies done by Dr. Lina Sar revealed a  hemoglobin of 12.8.   His admission chest x-ray in March 2009 revealed borderline heart size.  No acute cardiopulmonary disease, and scarring in the lower lobes and  lingula.   IMPRESSION:  1. Coronary artery disease with percutaneous stenting of the right      coronary artery.  2. Continued with scattered coronary disease in the other vessels.  3. Continued tobacco use.  4. Hypercholesterolemia, on lipid lowering therapy.  5. Diabetes mellitus.  6. Hypertension.  7. Moderate obesity.   PLAN:  1. We talked today about various strategies for discontinuation of      smoking, and I strongly encouraged him along these lines.  2. Return to clinic in 3-6 months.  3. Continue low-dose aspirin.   ADDENDUM:  EKG today revealed normal sinus rhythm essentially within  normal  limits.  We were able to track down laboratory studies done on  June 02, 2008, by Lina Sar with a  hemoglobin of 13.5, hematocrit of 38.6, and an MCV of 94 with iron  saturation of 20% and iron level of 70.     Clayton [name]. Riley Kill, MD, [facility]  Electronically Signed    TDS/MedQ  DD: 07/22/2008  DT: 07/22/2008  Job #: [id number]

## 2011-02-01 NOTE — Assessment & Plan Note (Signed)
[facility]                            CARDIOLOGY OFFICE NOTE   ZIMRI, BRENNEN                       MRN:          [id number]  DATE:10/11/2008                            DOB:          [birth year]    Mr. Rutigliano is in for a followup visit.  In general, he has been stable.  He underwent myocardial perfusion imaging with adenosine.  The patient  has known claudication and is followed by Dr. Edilia Bo.  At the time of  his radionuclide imaging study, this demonstrates predominant scar in  the inferior myocardial territory.  It is thought to represent a lower-  risk study.  Ejection fraction is 39%.  He has other disease.  The  disease involves the circumflex system predominantly.  At the time of  his cardiac catheterization, he had 3 overlapping nondrug-eluting stents  placed in the right.  Since then, he had scattered disease at that time  with multiple lesions in the circumflex territory.  There are 4 obtuse  marginal branches.  There was a focal 60-70% stenosis in the first  marginal followed by a 40-50%, the LAD at about 40-50% distal to that.  EF was 55% by cath, but that was at the time of his infarct.  He has not  had recurrent chest discomfort since I have seen him.  He does continue  to smoke, but he smokes mostly when nobody else is around.  With the  Myoview demonstrating no ischemia, continued medical management had been  recommended.   His current medications include:  1. Carvedilol 6.25 mg p.o. b.i.d.  2. Metformin hydrochloride 500 mg p.o. b.i.d.  3. Simvastatin 80 mg nightly.  4. Saw palmetto.  5. Multivitamin with iron.  6. Lisinopril 5 mg daily.  7. Glipizide 5 mg b.i.d.  8. Aspirin 81 mg daily.   On physical examination, he is alert and oriented in no distress.  Blood  pressure is 120/80, pulse is 76.  There are mild expiratory rhonchi.  Cardiac rhythm is regular.  There is an S4 gallop.   IMPRESSION:  1. Coronary artery  disease status post percutaneous stenting of the      right coronary artery with nondrug-eluting platform.  2. Scattered 3-vessel disease as previously described in the cath      report.  3. Nonischemic radionuclide imaging study.  4. Hypercholesterolemia on lipid-lowering therapy.  5. Non-insulin-dependent type 2 diabetes mellitus.  6. Hyperlipidemia.  7. Continued tobacco use.   RECOMMENDATIONS:  1. Discussion regarding tobacco use and recommendations to quit.  2. Attempt to optimize current medical regimen.  3. Continue aspirin at just 81 mg.  4. Continued follow up with Dr. Lina Sar.  The patient has a benign      polyp and also has mild gastroesophageal reflux with prior evidence      of iron deficiency anemia.  5. Return to clinic in 6 months.     Arturo Morton. Riley Kill, MD, [facility]  Electronically Signed    TDS/MedQ  DD: 10/11/2008  DT: 10/12/2008  Job #: [id number]

## 2011-06-10 LAB — COMPREHENSIVE METABOLIC PANEL
AST: 76 — ABNORMAL HIGH
Albumin: 3.7
Calcium: 9.5
Chloride: 105
Creatinine, Ser: 0.78
GFR calc Af Amer: >60

## 2011-06-10 LAB — CBC
Hemoglobin: 14.4
MCHC: 34.1
MCHC: 34.2
MCHC: 34.4
MCHC: 35.2
MCV: 91.7
MCV: 92.4
Platelets: 204
Platelets: 279
RBC: 3.55 — ABNORMAL LOW
RBC: 4.52
RDW: 12
WBC: 7.2
WBC: 7.8
WBC: 8.2
WBC: 8.3

## 2011-06-10 LAB — CARDIAC PANEL(CRET KIN+CKTOT+MB+TROPI)
CK, MB: 106.8 — ABNORMAL HIGH
CK, MB: 186.8 — ABNORMAL HIGH
Total CK: 1152 — ABNORMAL HIGH
Total CK: 1736 — ABNORMAL HIGH
Troponin I: 3.12

## 2011-06-10 LAB — BASIC METABOLIC PANEL
BUN: 8
CO2: 23
Calcium: 8.3 — ABNORMAL LOW
Chloride: 105
Creatinine, Ser: 0.78
Creatinine, Ser: 0.79
GFR calc Af Amer: >60
Glucose, Bld: 156 — ABNORMAL HIGH

## 2011-06-10 LAB — DIFFERENTIAL
Lymphocytes Relative: 26
Lymphs Abs: 2.2
Monocytes Absolute: 0.6
Monocytes Relative: 8
Neutro Abs: 5.5

## 2011-06-10 LAB — I-STAT 8, (EC8 V) (CONVERTED LAB)
Acid-Base Excess: 2
Chloride: 104
HCT: 47
Hemoglobin: 16
Operator id: 234501
Potassium: 4.8
Sodium: 137
pCO2, Ven: 60.3 — ABNORMAL HIGH

## 2011-06-10 LAB — HEMOGLOBIN A1C
Hgb A1c MFr Bld: 6.3 — ABNORMAL HIGH
Mean Plasma Glucose: 147
Mean Plasma Glucose: 158

## 2011-06-10 LAB — TSH: TSH: 0.644

## 2011-06-10 LAB — POCT CARDIAC MARKERS
Myoglobin, poc: 309
Operator id: 234501
Troponin i, poc: 0.79

## 2011-06-10 LAB — APTT: aPTT: >200

## 2011-06-10 LAB — PROTIME-INR: INR: 1

## 2011-06-10 LAB — LIPID PANEL: Cholesterol: 164

## 2011-06-10 LAB — CK TOTAL AND CKMB (NOT AT ARMC)
CK, MB: 155 — ABNORMAL HIGH
Total CK: 1991 — ABNORMAL HIGH
Total CK: 758 — ABNORMAL HIGH

## 2011-11-14 ENCOUNTER — Encounter: Payer: Self-pay | Admitting: *Deleted

## 2011-12-05 ENCOUNTER — Ambulatory Visit: Payer: 59 | Admitting: Cardiology

## 2011-12-31 ENCOUNTER — Ambulatory Visit (INDEPENDENT_AMBULATORY_CARE_PROVIDER_SITE_OTHER): Payer: 59 | Admitting: Cardiology

## 2011-12-31 ENCOUNTER — Encounter: Payer: Self-pay | Admitting: Cardiology

## 2011-12-31 VITALS — BP 150/99 | HR 81 | Ht 72.0 in | Wt 210.0 lb

## 2011-12-31 DIAGNOSIS — E785 Hyperlipidemia, unspecified: Secondary | ICD-10-CM

## 2011-12-31 DIAGNOSIS — D649 Anemia, unspecified: Secondary | ICD-10-CM

## 2011-12-31 DIAGNOSIS — R9431 Abnormal electrocardiogram [ECG] [EKG]: Secondary | ICD-10-CM

## 2011-12-31 DIAGNOSIS — F17200 Nicotine dependence, unspecified, uncomplicated: Secondary | ICD-10-CM

## 2011-12-31 DIAGNOSIS — I2510 Atherosclerotic heart disease of native coronary artery without angina pectoris: Secondary | ICD-10-CM

## 2011-12-31 NOTE — Patient Instructions (Signed)
Your physician wants you to follow-up in:  6 months. You will receive a reminder letter in the mail two months in advance. If you don't receive a letter, please call our office to schedule the follow-up appointment.   

## 2012-01-05 NOTE — Progress Notes (Signed)
   HPI:  Mr. Dymek is in for followup.  He is worried about his current situation, specifically about his elevated PSA.  He apparently is scheduled to see a urologist again at [facility].  He also stopped his iron, and then was noted to be anemic again.  Otherwise he has been stable.  He continues to smoke cigarettes and we had a long discussion about this today.  No specific chest pain at present.    Current Outpatient Prescriptions  Medication Sig Dispense Refill  . carvedilol (COREG) 6.25 MG tablet Take 6.25 mg by mouth 2 (two) times daily with a meal.      . ferrous sulfate 325 (65 FE) MG tablet Take 325 mg by mouth daily with breakfast.      . fish oil-omega-3 fatty acids 1000 MG capsule Take 2 g by mouth daily.      Marland Kitchen glipiZIDE-metformin (METAGLIP) 5-500 MG per tablet Take 1 tablet by mouth 2 (two) times daily before a meal.      . lisinopril (PRINIVIL,ZESTRIL) 5 MG tablet Take 5 mg by mouth daily.      . nitroGLYCERIN (NITROSTAT) 0.4 MG SL tablet Place 0.4 mg under the tongue every 5 (five) minutes as needed.      Marland Kitchen PRAVASTATIN SODIUM PO Take 20 mg by mouth daily.      . quinapril (ACCUPRIL) 20 MG tablet Take 20 mg by mouth daily.      . Saw Palmetto 1000 MG CAPS Take 1,000 mg by mouth daily.        No Known Allergies  Past Medical History  Diagnosis Date  . Myocardial infarction   . HTN (hypertension)   . Diabetes mellitus   . PVD (peripheral vascular disease)   . Hyperlipidemia     Past Surgical History  Procedure Date  . Cardiac stents 2009    Family History  Problem Relation Age of Onset  . Coronary artery disease Mother   . Coronary artery disease Father   . Prostate cancer Father     History   Social History  . Marital Status: Single    Spouse Name: N/A    Number of Children: N/A  . Years of Education: N/A   Occupational History  . material handler    Social History Main Topics  . Smoking status: Current Everyday Smoker  . Smokeless tobacco: Not  on file  . Alcohol Use: No  . Drug Use: No  . Sexually Active: Not on file   Other Topics Concern  . Not on file   Social History Narrative  . No narrative on file    ROS: Please see the HPI.  All other systems reviewed and negative.  PHYSICAL EXAM:  BP 150/99  Pulse 81  Ht 6' (1.829 m)  Wt 210 lb (95.255 kg)  BMI 28.48 kg/m2  General: Well developed, well nourished, in no acute distress. Head:  Normocephalic and atraumatic. Neck: no JVD Lungs:  Prolonged expiration with slight ronchii.  No rales.   Heart: Normal S1 and S2.  No murmur, rubs or gallops.  Abdomen:  Normal bowel sounds; soft; non tender; no organomegaly Pulses: Pulses normal in all 4 extremities. Extremities: No clubbing or cyanosis. No edema. Neurologic: Alert and oriented x 3.  EKG:  NSR.  PVCs with trigeminy.  Prominent P waves, slightly more than 2012.  Now pulmonary disease pattern.    ASSESSMENT AND PLAN:

## 2012-01-06 DIAGNOSIS — R9431 Abnormal electrocardiogram [ECG] [EKG]: Secondary | ICD-10-CM | POA: Insufficient documentation

## 2012-01-06 NOTE — Assessment & Plan Note (Signed)
Probably needs a lipid profile.  TS

## 2012-01-06 NOTE — Assessment & Plan Note (Signed)
Now prominent P wave, and abnormal ECG.  Needs 2D echo to assess, and needs to get CXR.

## 2012-01-06 NOTE — Assessment & Plan Note (Signed)
No current symptoms.  He does continue to smoke.

## 2012-01-06 NOTE — Assessment & Plan Note (Signed)
No real luch in getting him to quit.

## 2012-01-06 NOTE — Assessment & Plan Note (Signed)
I have not seen his labs.  They do not appear in EPIC.  He says he is back on iron.

## 2012-04-16 ENCOUNTER — Telehealth: Payer: Self-pay | Admitting: *Deleted

## 2012-04-16 NOTE — Telephone Encounter (Signed)
CALLED PATIENT TO INFORM OF GOLD SEED PLACEMENT ON 04-21-12 AT 11:15 AM AT DR. Belva Crome OFFICE, AND HIS VISIT WITH DR. KINARD ON 04-27-12 AND HIS SIM, LVM FOR A RETURN CALL

## 2012-04-27 ENCOUNTER — Ambulatory Visit
Admission: RE | Admit: 2012-04-27 | Discharge: 2012-04-27 | Disposition: A | Discharge status: Home / Self Care | Payer: 59 | Source: Ambulatory Visit | Attending: Radiation Oncology | Admitting: Radiation Oncology

## 2012-04-27 ENCOUNTER — Encounter: Payer: Self-pay | Admitting: Radiation Oncology

## 2012-04-27 ENCOUNTER — Ambulatory Visit: Payer: 59 | Admitting: Radiation Oncology

## 2012-04-27 ENCOUNTER — Ambulatory Visit: Payer: 59

## 2012-04-27 VITALS — BP 165/84 | HR 90 | Temp 98.3°F | Resp 18 | Ht 72.0 in | Wt 213.4 lb

## 2012-04-27 DIAGNOSIS — N393 Stress incontinence (female) (male): Secondary | ICD-10-CM | POA: Insufficient documentation

## 2012-04-27 DIAGNOSIS — C61 Malignant neoplasm of prostate: Secondary | ICD-10-CM | POA: Insufficient documentation

## 2012-04-27 DIAGNOSIS — Z79899 Other long term (current) drug therapy: Secondary | ICD-10-CM | POA: Insufficient documentation

## 2012-04-27 DIAGNOSIS — Z510 Encounter for antineoplastic radiation therapy: Secondary | ICD-10-CM | POA: Insufficient documentation

## 2012-04-27 DIAGNOSIS — R3915 Urgency of urination: Secondary | ICD-10-CM | POA: Insufficient documentation

## 2012-04-27 NOTE — Progress Notes (Signed)
HERE TODAY FOR CONSULT OF PROSTATE.   NO C/O TODAY .  HERE FROM [place] FOR TX.

## 2012-04-27 NOTE — Progress Notes (Signed)
  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  Identity was confirmed.  All relevant records and images related to the planned course of therapy were reviewed.  The patient freely provided informed written consent to proceed with treatment after reviewing the details related to the planned course of therapy. The consent form was witnessed and verified by the simulation staff.  Then, the patient was set-up in a stable reproducible  supine position for radiation therapy.  CT images were obtained.  Surface markings were placed.  The CT images were loaded into the planning software.  Then the target and avoidance structures were contoured.  Treatment planning then occurred.  The radiation prescription was entered and confirmed.  A total of 1 complex treatment devices was fabricated. I have requested : Intensity Modulated Radiotherapy (IMRT) is medically necessary for this case for the following reason:  Rectal sparing.Marland Kitchen   PLAN:  The patient will receive 78.0 Gy in 40 fractions.  ________________________________    Billie Lade, PhD, MD

## 2012-04-27 NOTE — Progress Notes (Signed)
  Radiation Oncology         [phone] ________________________________  Name: Clayton [name] MRN: [id number]  Date: 04/27/2012  DOB: 25-Jun-1951  Re- evaluation note  CC: No primary provider on file.  Anner Crete, MD  Diagnosis:   Stage TI C., Gleason 7 adenocarcinoma the prostate   Narrative:  The patient returns today for planning his radiation therapy.  The patient was initially seen in consultation on 04/09/2012 at the [facility] in [place]. The patient does work in the [place] area and wishes to have his radiation therapy at the [place] facility.  The patient comes in for planning for his radiation therapy today. I discussed the overall treatment course side effects and potential toxicities of radiation therapy with the patient and his sister. The patient appears to understand and wishes to proceed with planning and his treatment. He did have gold fiducial markers placed last week by Dr. Annabell Howells without difficulty.                              ALLERGIES:   has no known allergies.  Meds: Current Outpatient Prescriptions  Medication Sig Dispense Refill  . carvedilol (COREG) 6.25 MG tablet Take 6.25 mg by mouth 2 (two) times daily with a meal.      . ferrous sulfate 325 (65 FE) MG tablet Take 325 mg by mouth daily with breakfast.      . fish oil-omega-3 fatty acids 1000 MG capsule Take 2 g by mouth daily.      Marland Kitchen glipiZIDE-metformin (METAGLIP) 5-500 MG per tablet Take 1 tablet by mouth 2 (two) times daily before a meal.      . lisinopril (PRINIVIL,ZESTRIL) 5 MG tablet Take 5 mg by mouth daily.      . nitroGLYCERIN (NITROSTAT) 0.4 MG SL tablet Place 0.4 mg under the tongue every 5 (five) minutes as needed.      Marland Kitchen PRAVASTATIN SODIUM PO Take 20 mg by mouth daily.      . quinapril (ACCUPRIL) 20 MG tablet Take 20 mg by mouth daily.      . Saw Palmetto 1000 MG CAPS Take 1,000 mg by mouth daily.        Physical Findings: The patient is in no acute distress. Patient is  alert and oriented.  height is 6' (1.829 m) and weight is 213 lb 6.4 oz (96.798 kg). His oral temperature is 98.3 F (36.8 C). His blood pressure is 165/84 and his pulse is 90. His respiration is 18. .  No significant changes.  Lab Findings: Lab Results  Component Value Date   WBC 6.0 04/02/2010   HGB 11.2* 04/02/2010   HCT 32.6* 04/02/2010   MCV 100.8* 04/02/2010   PLT 265.0 04/02/2010    @LASTCHEM @  Radiographic Findings: No results found.  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 He will receive 7800 cGy and 40 sessions using image guided intensity modulated radiation therapy.  _____________________________________   Billie Lade, PhD, MD

## 2012-04-27 NOTE — Progress Notes (Signed)
Met with patient to discuss RO billing. Pt has some financial concerns and was given an [employer] [facility] application to complete and bring back, as well as, will complete ACS referral. However, pt is over qualified for EPP at this time. Household income apprx 30,000.00 for one person.  Dx: 185 Prostate  Attending Rad: Dr. Marian Sorrow Tx: IMRT [phone] x [phone]

## 2012-05-06 ENCOUNTER — Ambulatory Visit
Admission: RE | Admit: 2012-05-06 | Discharge: 2012-05-06 | Disposition: A | Discharge status: Home / Self Care | Payer: 59 | Source: Ambulatory Visit | Attending: Radiation Oncology | Admitting: Radiation Oncology

## 2012-05-06 DIAGNOSIS — C61 Malignant neoplasm of prostate: Secondary | ICD-10-CM

## 2012-05-07 ENCOUNTER — Ambulatory Visit
Admission: RE | Admit: 2012-05-07 | Discharge: 2012-05-07 | Disposition: A | Discharge status: Home / Self Care | Payer: 59 | Source: Ambulatory Visit | Attending: Radiation Oncology | Admitting: Radiation Oncology

## 2012-05-08 ENCOUNTER — Ambulatory Visit
Admission: RE | Admit: 2012-05-08 | Discharge: 2012-05-08 | Disposition: A | Discharge status: Home / Self Care | Payer: 59 | Source: Ambulatory Visit | Attending: Radiation Oncology | Admitting: Radiation Oncology

## 2012-05-11 ENCOUNTER — Ambulatory Visit
Admission: RE | Admit: 2012-05-11 | Discharge: 2012-05-11 | Disposition: A | Discharge status: Home / Self Care | Payer: 59 | Source: Ambulatory Visit | Attending: Radiation Oncology | Admitting: Radiation Oncology

## 2012-05-12 ENCOUNTER — Encounter: Payer: Self-pay | Admitting: Radiation Oncology

## 2012-05-12 ENCOUNTER — Ambulatory Visit
Admission: RE | Admit: 2012-05-12 | Discharge: 2012-05-12 | Disposition: A | Discharge status: Home / Self Care | Payer: 59 | Source: Ambulatory Visit | Attending: Radiation Oncology | Admitting: Radiation Oncology

## 2012-05-12 VITALS — BP 168/89 | HR 93 | Temp 98.3°F | Resp 20 | Wt 216.0 lb

## 2012-05-12 DIAGNOSIS — C61 Malignant neoplasm of prostate: Secondary | ICD-10-CM

## 2012-05-12 NOTE — Progress Notes (Signed)
   Department of Radiation Oncology  Phone:  [phone] Fax:        [phone]   DIAGNOSIS: Stage TIC Gleason's 7 adenocarcinoma the prostate  Weekly Management Note  Current Dose: 9.75 Gy  Projected Dose: 78.0 Gy   Narrative:  The patient presents for routine under treatment assessment.  CBCT/MVCT images/Port film x-rays were reviewed.  The chart was checked. He is tolerating his treatments well at this time without any side effects.  Physical Findings:  The lungs are clear. The heart has a regular rhythm and rate. The abdomen is soft and nontender with normal bowel sounds.  Vitals:  Filed Vitals:   05/12/12 1441  BP: 168/89  Pulse: 93  Temp: 98.3 F (36.8 C)  Resp: 20   Weight:  Wt Readings from Last 3 Encounters:  05/12/12 216 lb (97.977 kg)  04/27/12 213 lb 6.4 oz (96.798 kg)  12/31/11 210 lb (95.255 kg)   Lab Results  Component Value Date   WBC 6.0 04/02/2010   HGB 11.2* 04/02/2010   HCT 32.6* 04/02/2010   MCV 100.8* 04/02/2010   PLT 265.0 04/02/2010   Lab Results  Component Value Date   CREATININE 0.79 11/21/2007   BUN 8 11/21/2007   NA 137 11/21/2007   K 3.6 11/21/2007   CL 105 11/21/2007   CO2 27 11/21/2007     Impression:  The patient is tolerating radiation well.  Plan:  Continue treatment as planned.

## 2012-05-12 NOTE — Progress Notes (Signed)
patient rad txs  prostate 5/40 completed,alert,oriented x3, radiation therapy and you book given to patient, post simmed, to report s/s, s/e to staff /MD, sees MD,staff RN weekly and prn, can call for any questions .concerns, schedule given to patient ,no dysuria,frequency,urgency, sometimes loose stools, takes pepto bismul prn, drinks mostly cranberry juice, encouraged to start drinking more water and come with full bladder before rad txs 2:50 PM

## 2012-05-13 ENCOUNTER — Ambulatory Visit
Admission: RE | Admit: 2012-05-13 | Discharge: 2012-05-13 | Disposition: A | Discharge status: Home / Self Care | Payer: 59 | Source: Ambulatory Visit | Attending: Radiation Oncology | Admitting: Radiation Oncology

## 2012-05-14 ENCOUNTER — Ambulatory Visit
Admission: RE | Admit: 2012-05-14 | Discharge: 2012-05-14 | Disposition: A | Discharge status: Home / Self Care | Payer: 59 | Source: Ambulatory Visit | Attending: Radiation Oncology | Admitting: Radiation Oncology

## 2012-05-15 ENCOUNTER — Ambulatory Visit
Admission: RE | Admit: 2012-05-15 | Discharge: 2012-05-15 | Disposition: A | Discharge status: Home / Self Care | Payer: 59 | Source: Ambulatory Visit | Attending: Radiation Oncology | Admitting: Radiation Oncology

## 2012-05-19 ENCOUNTER — Ambulatory Visit
Admission: RE | Admit: 2012-05-19 | Discharge: 2012-05-19 | Disposition: A | Discharge status: Home / Self Care | Payer: 59 | Source: Ambulatory Visit | Attending: Radiation Oncology | Admitting: Radiation Oncology

## 2012-05-19 ENCOUNTER — Telehealth: Payer: Self-pay | Admitting: Radiation Oncology

## 2012-05-19 ENCOUNTER — Encounter: Payer: Self-pay | Admitting: Radiation Oncology

## 2012-05-19 VITALS — BP 169/86 | HR 82 | Temp 98.9°F | Resp 20 | Wt 213.0 lb

## 2012-05-19 DIAGNOSIS — C61 Malignant neoplasm of prostate: Secondary | ICD-10-CM

## 2012-05-19 NOTE — Progress Notes (Signed)
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

## 2012-05-19 NOTE — Telephone Encounter (Signed)
Pt had concerns regarding transportation (gas). [facility] [facility]) application residence only was emailed to [email or web address]) today for addl assistance at this time.

## 2012-05-19 NOTE — Progress Notes (Signed)
[facility]    Radiation Oncology [address] [place]     Maryln Gottron, M.D. [place], [place] [address]               Billie Lade, M.D., Ph.D. Phone: [phone]      Molli Hazard A. Kathrynn Running, M.D. Fax: [phone]      Radene Gunning, M.D., Ph.D.         Lurline Hare, M.D.         Grayland Jack, M.D Weekly Treatment Management Note  Name: Clayton [name]     MRN: [id number]        CSN: [id number] Date: 05/19/2012      DOB: 07-31-1951  CC: No primary provider on file.         Wrenn    Status: Outpatient  Diagnosis: The encounter diagnosis was Prostate cancer.  Current Dose: 1755 cGy  Current Fraction: 9  Planned Dose: 7800 cGy  Narrative: Clayton [name] was seen today for weekly treatment management. The chart was checked and CBCT  were reviewed. He continues to tolerate the treatments well without any side effects  at this time. Patient in addition continues to work his usual full-time schedule. He denies any urinary symptoms or bowel complaints.  Review of patient's allergies indicates no known allergies. Current Outpatient Prescriptions  Medication Sig Dispense Refill  . carvedilol (COREG) 6.25 MG tablet Take 6.25 mg by mouth 2 (two) times daily with a meal.      . ferrous sulfate 325 (65 FE) MG tablet Take 325 mg by mouth daily with breakfast.      . fish oil-omega-3 fatty acids 1000 MG capsule Take 2 g by mouth daily.      Marland Kitchen glipiZIDE (GLUCOTROL) 10 MG tablet Take 10 mg by mouth BID times 48H.      Marland Kitchen glipiZIDE-metformin (METAGLIP) 5-500 MG per tablet Take 1 tablet by mouth 2 (two) times daily before a meal.      . lisinopril (PRINIVIL,ZESTRIL) 5 MG tablet Take 5 mg by mouth daily.      . metFORMIN (GLUCOPHAGE) 500 MG tablet Take 500 mg by mouth Twice daily. 2 tabs bid      . nitroGLYCERIN (NITROSTAT) 0.4 MG SL tablet Place 0.4 mg under the tongue every 5 (five) minutes as needed.      Marland Kitchen omeprazole (PRILOSEC) 20 MG capsule Take 20 mg by mouth Daily.        Marland Kitchen PRAVASTATIN SODIUM PO Take 20 mg by mouth daily.      . quinapril (ACCUPRIL) 20 MG tablet Take 20 mg by mouth daily.      . Saw Palmetto 1000 MG CAPS Take 1,000 mg by mouth daily.       Labs:  Lab Results  Component Value Date   WBC 6.0 04/02/2010   HGB 11.2* 04/02/2010   HCT 32.6* 04/02/2010   MCV 100.8* 04/02/2010   PLT 265.0 04/02/2010   Lab Results  Component Value Date   CREATININE 0.79 11/21/2007   BUN 8 11/21/2007   NA 137 11/21/2007   K 3.6 11/21/2007   CL 105 11/21/2007   CO2 27 11/21/2007   Lab Results  Component Value Date   ALT 23 02/23/2010   AST 27 02/23/2010   BILITOT 0.8 02/23/2010    Physical Examination:  weight is 213 lb (96.616 kg). His oral temperature is 98.9 F (37.2 C). His blood pressure is 169/86 and his pulse is 82. His respiration is 20.  Wt Readings from Last 3 Encounters:  05/19/12 213 lb (96.616 kg)  05/12/12 216 lb (97.977 kg)  04/27/12 213 lb 6.4 oz (96.798 kg)     Lungs - Normal respiratory effort, chest expands symmetrically. Lungs are clear to auscultation, no crackles or wheezes.  Heart has regular rhythm and rate  Abdomen is soft and non tender with normal bowel sounds  Assessment:  Patient tolerating treatments well  Plan: Continue treatment per original radiation prescription  to 7800 cGy.  -----------------------------------  Billie Lade, PhD, MD

## 2012-05-20 ENCOUNTER — Ambulatory Visit
Admission: RE | Admit: 2012-05-20 | Discharge: 2012-05-20 | Disposition: A | Discharge status: Home / Self Care | Payer: 59 | Source: Ambulatory Visit | Attending: Radiation Oncology | Admitting: Radiation Oncology

## 2012-05-21 ENCOUNTER — Ambulatory Visit
Admission: RE | Admit: 2012-05-21 | Discharge: 2012-05-21 | Disposition: A | Discharge status: Home / Self Care | Payer: 59 | Source: Ambulatory Visit | Attending: Radiation Oncology | Admitting: Radiation Oncology

## 2012-05-22 ENCOUNTER — Ambulatory Visit
Admission: RE | Admit: 2012-05-22 | Discharge: 2012-05-22 | Disposition: A | Discharge status: Home / Self Care | Payer: 59 | Source: Ambulatory Visit | Attending: Radiation Oncology | Admitting: Radiation Oncology

## 2012-05-25 ENCOUNTER — Ambulatory Visit
Admission: RE | Admit: 2012-05-25 | Discharge: 2012-05-25 | Disposition: A | Discharge status: Home / Self Care | Payer: 59 | Source: Ambulatory Visit | Attending: Radiation Oncology | Admitting: Radiation Oncology

## 2012-05-26 ENCOUNTER — Ambulatory Visit
Admission: RE | Admit: 2012-05-26 | Discharge: 2012-05-26 | Disposition: A | Discharge status: Home / Self Care | Payer: 59 | Source: Ambulatory Visit | Attending: Radiation Oncology | Admitting: Radiation Oncology

## 2012-05-26 DIAGNOSIS — C61 Malignant neoplasm of prostate: Secondary | ICD-10-CM

## 2012-05-26 NOTE — Progress Notes (Signed)
Patient here for weekly assessment of prostate cancer radiation treatment.Completed 14 of 40 treatments.Denies pain.Occasional frequency.No bowel changes and no nocturia.

## 2012-05-26 NOTE — Progress Notes (Signed)
[facility]    Radiation Oncology [address] [place]     Maryln Gottron, M.D. [place], [place] [address]               Billie Lade, M.D., Ph.D. Phone: [phone]      Molli Hazard A. Kathrynn Running, M.D. Fax: [phone]      Radene Gunning, M.D., Ph.D.         Lurline Hare, M.D.         Grayland Jack, M.D Weekly Treatment Management Note  Name: Clayton [name]     MRN: [id number]        CSN: [id number] Date: 05/26/2012      DOB: December 24, 1950  CC: No primary provider on file.         Wrenn    Status: Outpatient  Diagnosis: The encounter diagnosis was Prostate cancer.  Current Dose: 27.3 Gy  Current Fraction: 14  Planned Dose: 78.0 Gy  Narrative: [name] was seen today for weekly treatment management. The chart was checked and CBCT  were reviewed. He continues to tolerate the treatments well without any bowel or bladder side effects.  He is having some fatigue but is able to work his usual full-time schedule.  Review of patient's allergies indicates no known allergies. Current Outpatient Prescriptions  Medication Sig Dispense Refill  . carvedilol (COREG) 6.25 MG tablet Take 6.25 mg by mouth 2 (two) times daily with a meal.      . ferrous sulfate 325 (65 FE) MG tablet Take 325 mg by mouth daily with breakfast.      . fish oil-omega-3 fatty acids 1000 MG capsule Take 2 g by mouth daily.      Marland Kitchen glipiZIDE (GLUCOTROL) 10 MG tablet Take 10 mg by mouth BID times 48H.      Marland Kitchen glipiZIDE-metformin (METAGLIP) 5-500 MG per tablet Take 1 tablet by mouth 2 (two) times daily before a meal.      . lisinopril (PRINIVIL,ZESTRIL) 5 MG tablet Take 5 mg by mouth daily.      . metFORMIN (GLUCOPHAGE) 500 MG tablet Take 500 mg by mouth Twice daily. 2 tabs bid      . nitroGLYCERIN (NITROSTAT) 0.4 MG SL tablet Place 0.4 mg under the tongue every 5 (five) minutes as needed.      Marland Kitchen omeprazole (PRILOSEC) 20 MG capsule Take 20 mg by mouth Daily.      Marland Kitchen PRAVASTATIN SODIUM PO Take 20 mg by  mouth daily.      . quinapril (ACCUPRIL) 20 MG tablet Take 20 mg by mouth daily.      . Saw Palmetto 1000 MG CAPS Take 1,000 mg by mouth daily.       Labs:  Lab Results  Component Value Date   WBC 6.0 04/02/2010   HGB 11.2* 04/02/2010   HCT 32.6* 04/02/2010   MCV 100.8* 04/02/2010   PLT 265.0 04/02/2010   Lab Results  Component Value Date   CREATININE 0.79 11/21/2007   BUN 8 11/21/2007   NA 137 11/21/2007   K 3.6 11/21/2007   CL 105 11/21/2007   CO2 27 11/21/2007   Lab Results  Component Value Date   ALT 23 02/23/2010   AST 27 02/23/2010   BILITOT 0.8 02/23/2010    Physical Examination:  vitals were not taken for this visit.   Wt Readings from Last 3 Encounters:  05/19/12 213 lb (96.616 kg)  05/12/12 216 lb (97.977 kg)  04/27/12 213 lb 6.4 oz (  96.798 kg)     Lungs - Normal respiratory effort, chest expands symmetrically. Lungs are clear to auscultation, no crackles or wheezes.  Heart has regular rhythm and rate  Abdomen is soft and non tender with normal bowel sounds  Assessment:  Patient tolerating treatments well  Plan: Continue treatment per original radiation prescription    -----------------------------------  Billie Lade, PhD, MD

## 2012-05-27 ENCOUNTER — Ambulatory Visit
Admission: RE | Admit: 2012-05-27 | Discharge: 2012-05-27 | Disposition: A | Discharge status: Home / Self Care | Payer: 59 | Source: Ambulatory Visit | Attending: Radiation Oncology | Admitting: Radiation Oncology

## 2012-05-28 ENCOUNTER — Ambulatory Visit
Admission: RE | Admit: 2012-05-28 | Discharge: 2012-05-28 | Disposition: A | Discharge status: Home / Self Care | Payer: 59 | Source: Ambulatory Visit | Attending: Radiation Oncology | Admitting: Radiation Oncology

## 2012-05-29 ENCOUNTER — Ambulatory Visit
Admission: RE | Admit: 2012-05-29 | Discharge: 2012-05-29 | Disposition: A | Discharge status: Home / Self Care | Payer: 59 | Source: Ambulatory Visit | Attending: Radiation Oncology | Admitting: Radiation Oncology

## 2012-06-01 ENCOUNTER — Ambulatory Visit
Admission: RE | Admit: 2012-06-01 | Discharge: 2012-06-01 | Disposition: A | Discharge status: Home / Self Care | Payer: 59 | Source: Ambulatory Visit | Attending: Radiation Oncology | Admitting: Radiation Oncology

## 2012-06-02 ENCOUNTER — Ambulatory Visit
Admission: RE | Admit: 2012-06-02 | Discharge: 2012-06-02 | Disposition: A | Discharge status: Home / Self Care | Payer: 59 | Source: Ambulatory Visit | Attending: Radiation Oncology | Admitting: Radiation Oncology

## 2012-06-02 ENCOUNTER — Encounter: Payer: Self-pay | Admitting: Radiation Oncology

## 2012-06-02 VITALS — BP 141/75 | HR 83 | Temp 99.1°F | Resp 20 | Wt 213.9 lb

## 2012-06-02 DIAGNOSIS — C61 Malignant neoplasm of prostate: Secondary | ICD-10-CM

## 2012-06-02 NOTE — Progress Notes (Signed)
Pt states he occasionally has "slight sensation when he voids but nothing serious". Pt denies other urinary or bowel issues, fatigue, loss of appetite.

## 2012-06-02 NOTE — Progress Notes (Signed)
[facility]    Radiation Oncology [address] [place]     Maryln Gottron, M.D. [place], [place] [address]               Billie Lade, M.D., Ph.D. Phone: [phone]      Molli Hazard A. Kathrynn Running, M.D. Fax: [phone]      Radene Gunning, M.D., Ph.D.         Lurline Hare, M.D.         Grayland Jack, M.D Weekly Treatment Management Note  Name: Clayton [name]     MRN: [id number]        CSN: [id number] Date: 06/02/2012      DOB: 08-02-1951  CC: No primary provider on file.         Clayton [name]    Status: Outpatient  Diagnosis: The encounter diagnosis was Prostate cancer.  Current Dose: 3705 cGy  Current Fraction: 19  Planned Dose: 7800 cGy  Narrative: Clayton [name] was seen today for weekly treatment management. The chart was checked and CBCT  were reviewed. He continues to tolerate his treatments well. He has noticed some slight dysuria but no other symptoms at this time. He he continues to work as usual schedule. He may possibly note some mild fatigue.  Review of patient's allergies indicates no known allergies.  Current Outpatient Prescriptions  Medication Sig Dispense Refill  . carvedilol (COREG) 6.25 MG tablet Take 6.25 mg by mouth 2 (two) times daily with a meal.      . ferrous sulfate 325 (65 FE) MG tablet Take 325 mg by mouth daily with breakfast.      . fish oil-omega-3 fatty acids 1000 MG capsule Take 2 g by mouth daily.      Marland Kitchen glipiZIDE (GLUCOTROL) 10 MG tablet Take 10 mg by mouth BID times 48H.      Marland Kitchen glipiZIDE-metformin (METAGLIP) 5-500 MG per tablet Take 1 tablet by mouth 2 (two) times daily before a meal.      . lisinopril (PRINIVIL,ZESTRIL) 5 MG tablet Take 5 mg by mouth daily.      . metFORMIN (GLUCOPHAGE) 500 MG tablet Take 500 mg by mouth Twice daily. 2 tabs bid      . nitroGLYCERIN (NITROSTAT) 0.4 MG SL tablet Place 0.4 mg under the tongue every 5 (five) minutes as needed.      Marland Kitchen omeprazole (PRILOSEC) 20 MG capsule Take 20 mg by mouth Daily.      .  penicillin v potassium (VEETID) 500 MG tablet       . PRAVASTATIN SODIUM PO Take 20 mg by mouth daily.      . quinapril (ACCUPRIL) 20 MG tablet Take 20 mg by mouth daily.      . Saw Palmetto 1000 MG CAPS Take 1,000 mg by mouth daily.       Labs:  Lab Results  Component Value Date   WBC 6.0 04/02/2010   HGB 11.2* 04/02/2010   HCT 32.6* 04/02/2010   MCV 100.8* 04/02/2010   PLT 265.0 04/02/2010   Lab Results  Component Value Date   CREATININE 0.79 11/21/2007   BUN 8 11/21/2007   NA 137 11/21/2007   K 3.6 11/21/2007   CL 105 11/21/2007   CO2 27 11/21/2007   Lab Results  Component Value Date   ALT 23 02/23/2010   AST 27 02/23/2010   BILITOT 0.8 02/23/2010    Physical Examination:  weight is 213 lb 14.4 oz (97.024 kg). His oral temperature is  99.1 F (37.3 C). His blood pressure is 141/75 and his pulse is 83. His respiration is 20.    Wt Readings from Last 3 Encounters:  06/02/12 213 lb 14.4 oz (97.024 kg)  05/19/12 213 lb (96.616 kg)  05/12/12 216 lb (97.977 kg)     Lungs - Normal respiratory effort, chest expands symmetrically. Lungs are clear to auscultation, no crackles or wheezes.  Heart has regular rhythm and rate  Abdomen is soft and non tender with normal bowel sounds  Assessment:  Patient tolerating treatments well with minimal side effects this time.  Plan: Continue treatment per original radiation prescription    -----------------------------------  Billie Lade, PhD, MD

## 2012-06-03 ENCOUNTER — Ambulatory Visit
Admission: RE | Admit: 2012-06-03 | Discharge: 2012-06-03 | Disposition: A | Discharge status: Home / Self Care | Payer: 59 | Source: Ambulatory Visit | Attending: Radiation Oncology | Admitting: Radiation Oncology

## 2012-06-04 ENCOUNTER — Ambulatory Visit
Admission: RE | Admit: 2012-06-04 | Discharge: 2012-06-04 | Disposition: A | Discharge status: Home / Self Care | Payer: 59 | Source: Ambulatory Visit | Attending: Radiation Oncology | Admitting: Radiation Oncology

## 2012-06-05 ENCOUNTER — Ambulatory Visit
Admission: RE | Admit: 2012-06-05 | Discharge: 2012-06-05 | Disposition: A | Discharge status: Home / Self Care | Payer: 59 | Source: Ambulatory Visit | Attending: Radiation Oncology | Admitting: Radiation Oncology

## 2012-06-08 ENCOUNTER — Ambulatory Visit
Admission: RE | Admit: 2012-06-08 | Discharge: 2012-06-08 | Disposition: A | Discharge status: Home / Self Care | Payer: 59 | Source: Ambulatory Visit | Attending: Radiation Oncology | Admitting: Radiation Oncology

## 2012-06-09 ENCOUNTER — Ambulatory Visit
Admission: RE | Admit: 2012-06-09 | Discharge: 2012-06-09 | Disposition: A | Discharge status: Home / Self Care | Payer: 59 | Source: Ambulatory Visit | Attending: Radiation Oncology | Admitting: Radiation Oncology

## 2012-06-09 ENCOUNTER — Encounter: Payer: Self-pay | Admitting: Radiation Oncology

## 2012-06-09 VITALS — BP 172/101 | HR 87 | Temp 98.3°F | Resp 20 | Wt 214.5 lb

## 2012-06-09 DIAGNOSIS — C61 Malignant neoplasm of prostate: Secondary | ICD-10-CM

## 2012-06-09 NOTE — Progress Notes (Signed)
[facility]    Radiation Oncology [address] [place]     Maryln Gottron, M.D. [place], [place] [address]               Billie Lade, M.D., Ph.D. Phone: [phone]      Molli Hazard A. Kathrynn Running, M.D. Fax: [phone]      Radene Gunning, M.D., Ph.D.         Lurline Hare, M.D.         Grayland Jack, M.D Weekly Treatment Management Note  Name: Clayton [name]     MRN: [id number]        CSN: [id number] Date: 06/09/2012      DOB: [birth year]  CC: No primary provider on file.         Wrenn    Status: Outpatient  Diagnosis: The encounter diagnosis was Prostate cancer.  Current Dose: 4680 cGy  Current Fraction: 24/40  Planned Dose: 7800 cGy  Narrative: Clayton [name] was seen today for weekly treatment management. The chart was checked and CBCT  were reviewed. He continues to tolerate the treatments reasonably well. He has noticed some mild urinary urgency but no dysuria. Patient is having approximately 3 bowel movements per day without any bleeding or diarrhea.  He continues to work his  usual schedule.  Review of patient's allergies indicates no known allergies. Current Outpatient Prescriptions  Medication Sig Dispense Refill  . carvedilol (COREG) 6.25 MG tablet Take 6.25 mg by mouth 2 (two) times daily with a meal.      . ferrous sulfate 325 (65 FE) MG tablet Take 325 mg by mouth daily with breakfast.      . fish oil-omega-3 fatty acids 1000 MG capsule Take 2 g by mouth daily.      Marland Kitchen glipiZIDE (GLUCOTROL) 10 MG tablet Take 10 mg by mouth BID times 48H.      Marland Kitchen glipiZIDE-metformin (METAGLIP) 5-500 MG per tablet Take 1 tablet by mouth 2 (two) times daily before a meal.      . lisinopril (PRINIVIL,ZESTRIL) 5 MG tablet Take 5 mg by mouth daily.      . metFORMIN (GLUCOPHAGE) 500 MG tablet Take 500 mg by mouth Twice daily. 2 tabs bid      . nitroGLYCERIN (NITROSTAT) 0.4 MG SL tablet Place 0.4 mg under the tongue every 5 (five) minutes as needed.      Marland Kitchen omeprazole  (PRILOSEC) 20 MG capsule Take 20 mg by mouth Daily.      . penicillin v potassium (VEETID) 500 MG tablet       . PRAVASTATIN SODIUM PO Take 20 mg by mouth daily.      . quinapril (ACCUPRIL) 20 MG tablet Take 20 mg by mouth daily.      . Saw Palmetto 1000 MG CAPS Take 1,000 mg by mouth daily.       Labs:  Lab Results  Component Value Date   WBC 6.0 04/02/2010   HGB 11.2* 04/02/2010   HCT 32.6* 04/02/2010   MCV 100.8* 04/02/2010   PLT 265.0 04/02/2010   Lab Results  Component Value Date   CREATININE 0.79 11/21/2007   BUN 8 11/21/2007   NA 137 11/21/2007   K 3.6 11/21/2007   CL 105 11/21/2007   CO2 27 11/21/2007   Lab Results  Component Value Date   ALT 23 02/23/2010   AST 27 02/23/2010   BILITOT 0.8 02/23/2010    Physical Examination:  weight is 214 lb 8 oz (97.297  kg). His oral temperature is 98.3 F (36.8 C). His blood pressure is 172/101 and his pulse is 87. His respiration is 20.    Wt Readings from Last 3 Encounters:  06/09/12 214 lb 8 oz (97.297 kg)  06/02/12 213 lb 14.4 oz (97.024 kg)  05/19/12 213 lb (96.616 kg)     Lungs - Normal respiratory effort, chest expands symmetrically. Lungs are clear to auscultation, no crackles or wheezes.  Heart has regular rhythm and rate  Abdomen is soft and non tender with normal bowel sounds  Assessment:  Patient tolerating treatments well  Plan: Continue treatment per original radiation prescription   -----------------------------------  Billie Lade, PhD, MD

## 2012-06-09 NOTE — Addendum Note (Signed)
Encounter addended by: Delynn Flavin, RN on: 06/09/2012 11:29 AM<BR>     Documentation filed: Charges VN

## 2012-06-09 NOTE — Progress Notes (Signed)
Patient here for weekly rad txs: prostate,24/40 compleetd, no c/o dysuria, does have 3 loose stools daily, no blood in urine, does have some increased urgency, ate country ham today says that is why his b/p is high, b/p=172/101, no c/o pin 2:43 PM

## 2012-06-10 ENCOUNTER — Ambulatory Visit
Admission: RE | Admit: 2012-06-10 | Discharge: 2012-06-10 | Disposition: A | Discharge status: Home / Self Care | Payer: 59 | Source: Ambulatory Visit | Attending: Radiation Oncology | Admitting: Radiation Oncology

## 2012-06-10 NOTE — Progress Notes (Signed)
Patient stopped by inquiring about FMLA paperwork sent from [facility].  Some paperwork was faxed back to [facility] on 05/15/12, but addl paperwork rec'd today and placed in Dr. Trina Ao box.  Patient stated this has gone on for a while, so told him he would need to check with nursing staff to see physician about it the next time he is here for treatment.

## 2012-06-11 ENCOUNTER — Ambulatory Visit
Admission: RE | Admit: 2012-06-11 | Discharge: 2012-06-11 | Disposition: A | Discharge status: Home / Self Care | Payer: 59 | Source: Ambulatory Visit | Attending: Radiation Oncology | Admitting: Radiation Oncology

## 2012-06-12 ENCOUNTER — Ambulatory Visit
Admission: RE | Admit: 2012-06-12 | Discharge: 2012-06-12 | Disposition: A | Discharge status: Home / Self Care | Payer: 59 | Source: Ambulatory Visit | Attending: Radiation Oncology | Admitting: Radiation Oncology

## 2012-06-15 ENCOUNTER — Ambulatory Visit
Admission: RE | Admit: 2012-06-15 | Discharge: 2012-06-15 | Disposition: A | Discharge status: Home / Self Care | Payer: 59 | Source: Ambulatory Visit | Attending: Radiation Oncology | Admitting: Radiation Oncology

## 2012-06-16 ENCOUNTER — Ambulatory Visit
Admission: RE | Admit: 2012-06-16 | Discharge: 2012-06-16 | Disposition: A | Discharge status: Home / Self Care | Payer: 59 | Source: Ambulatory Visit | Attending: Radiation Oncology | Admitting: Radiation Oncology

## 2012-06-16 VITALS — BP 144/59 | HR 86 | Temp 99.0°F | Wt 215.2 lb

## 2012-06-16 DIAGNOSIS — C61 Malignant neoplasm of prostate: Secondary | ICD-10-CM

## 2012-06-16 NOTE — Progress Notes (Signed)
Patient here for weekly under treat assessment for radiation of prostate.Completed 29 of 40 treatments. Patient doing well, no noticeable urinary changes.Bowels normal.

## 2012-06-16 NOTE — Progress Notes (Signed)
[name]    Radiation Oncology [address] [place]     Maryln Gottron, M.D. [place], [place] [address]               Billie Lade, M.D., Ph.D. Phone: [phone]      Molli Hazard A. Kathrynn Running, M.D. Fax: [phone]      Radene Gunning, M.D., Ph.D.         Lurline Hare, M.D.         Grayland Jack, M.D Weekly Treatment Management Note  Name: Clayton [name]     MRN: [id number]        CSN: [id number] Date: 06/16/2012      DOB: 29-Jan-1951  CC: No primary provider on file.         Wrenn    Status: Outpatient  Diagnosis: The encounter diagnosis was Prostate cancer.  Current Dose: 5655 cGy  Current Fraction: 29/40  Planned Dose: 7800 cGy  Narrative: [name] was seen today for weekly treatment management. The chart was checked and CBCT  were reviewed. He continues to tolerate his treatment quite well. He has minimal urinary symptoms and no real bowel complaints at this time. He continues to work as full-time schedule. He has noticed some mild fatigue.  Review of patient's allergies indicates no known allergies.  Current Outpatient Prescriptions  Medication Sig Dispense Refill  . carvedilol (COREG) 6.25 MG tablet Take 6.25 mg by mouth 2 (two) times daily with a meal.      . ferrous sulfate 325 (65 FE) MG tablet Take 325 mg by mouth daily with breakfast.      . fish oil-omega-3 fatty acids 1000 MG capsule Take 2 g by mouth daily.      Marland Kitchen glipiZIDE (GLUCOTROL) 10 MG tablet Take 10 mg by mouth BID times 48H.      Marland Kitchen glipiZIDE-metformin (METAGLIP) 5-500 MG per tablet Take 1 tablet by mouth 2 (two) times daily before a meal.      . lisinopril (PRINIVIL,ZESTRIL) 5 MG tablet Take 5 mg by mouth daily.      . metFORMIN (GLUCOPHAGE) 500 MG tablet Take 500 mg by mouth Twice daily. 2 tabs bid      . nitroGLYCERIN (NITROSTAT) 0.4 MG SL tablet Place 0.4 mg under the tongue every 5 (five) minutes as needed.      Marland Kitchen omeprazole (PRILOSEC) 20 MG capsule Take 20 mg by mouth Daily.       . penicillin v potassium (VEETID) 500 MG tablet       . PRAVASTATIN SODIUM PO Take 20 mg by mouth daily.      . quinapril (ACCUPRIL) 20 MG tablet Take 20 mg by mouth daily.      . Saw Palmetto 1000 MG CAPS Take 1,000 mg by mouth daily.       Labs:  Lab Results  Component Value Date   WBC 6.0 04/02/2010   HGB 11.2* 04/02/2010   HCT 32.6* 04/02/2010   MCV 100.8* 04/02/2010   PLT 265.0 04/02/2010   Lab Results  Component Value Date   CREATININE 0.79 11/21/2007   BUN 8 11/21/2007   NA 137 11/21/2007   K 3.6 11/21/2007   CL 105 11/21/2007   CO2 27 11/21/2007   Lab Results  Component Value Date   ALT 23 02/23/2010   AST 27 02/23/2010   BILITOT 0.8 02/23/2010    Physical Examination:  weight is 215 lb 3.2 oz (97.614 kg). His temperature is 99 F (  37.2 C). His blood pressure is 144/59 and his pulse is 86.    Wt Readings from Last 3 Encounters:  06/16/12 215 lb 3.2 oz (97.614 kg)  06/09/12 214 lb 8 oz (97.297 kg)  06/02/12 213 lb 14.4 oz (97.024 kg)     Lungs - Normal respiratory effort, chest expands symmetrically. Lungs are clear to auscultation, no crackles or wheezes.  Heart has regular rhythm and rate  Abdomen is soft and non tender with normal bowel sounds  Assessment:  Patient tolerating treatments well  Plan: Continue treatment per original radiation prescription  to 7800 cGy.  -----------------------------------  Billie Lade, PhD, MD

## 2012-06-17 ENCOUNTER — Ambulatory Visit
Admission: RE | Admit: 2012-06-17 | Discharge: 2012-06-17 | Disposition: A | Discharge status: Home / Self Care | Payer: 59 | Source: Ambulatory Visit | Attending: Radiation Oncology | Admitting: Radiation Oncology

## 2012-06-18 ENCOUNTER — Ambulatory Visit
Admission: RE | Admit: 2012-06-18 | Discharge: 2012-06-18 | Disposition: A | Discharge status: Home / Self Care | Payer: 59 | Source: Ambulatory Visit | Attending: Radiation Oncology | Admitting: Radiation Oncology

## 2012-06-19 ENCOUNTER — Ambulatory Visit
Admission: RE | Admit: 2012-06-19 | Discharge: 2012-06-19 | Disposition: A | Discharge status: Home / Self Care | Payer: 59 | Source: Ambulatory Visit | Attending: Radiation Oncology | Admitting: Radiation Oncology

## 2012-06-22 ENCOUNTER — Ambulatory Visit
Admission: RE | Admit: 2012-06-22 | Discharge: 2012-06-22 | Disposition: A | Discharge status: Home / Self Care | Payer: 59 | Source: Ambulatory Visit | Attending: Radiation Oncology | Admitting: Radiation Oncology

## 2012-06-23 ENCOUNTER — Ambulatory Visit
Admission: RE | Admit: 2012-06-23 | Discharge: 2012-06-23 | Disposition: A | Discharge status: Home / Self Care | Payer: 59 | Source: Ambulatory Visit | Attending: Radiation Oncology | Admitting: Radiation Oncology

## 2012-06-23 VITALS — BP 155/77 | HR 63 | Temp 98.5°F | Wt 214.9 lb

## 2012-06-23 DIAGNOSIS — C61 Malignant neoplasm of prostate: Secondary | ICD-10-CM

## 2012-06-23 NOTE — Progress Notes (Signed)
Patient here for routine weekly under treat visit for pelvic radiation of prostate.No urinary changes.Occasional loose bowels.Mild fatigue.

## 2012-06-23 NOTE — Progress Notes (Signed)
[facility]    Radiation Oncology [address] [place]     Maryln Gottron, M.D. [place], [place] [address]               Billie Lade, M.D., Ph.D. Phone: [phone]      Molli Hazard A. Kathrynn Running, M.D. Fax: [phone]      Radene Gunning, M.D., Ph.D.         Lurline Hare, M.D.         Grayland Jack, M.D Weekly Treatment Management Note  Name: Clayton [name]     MRN: [id number]        CSN: [id number] Date: 06/23/2012      DOB: April 30, 1951  CC: No primary provider on file.         Wrenn    Status: Outpatient  Diagnosis: The encounter diagnosis was Prostate cancer.  Current Dose:6630 cGy  Current Fraction: 34/40  Planned Dose: 7800 cGy  Narrative: Clayton [name] was seen today for weekly treatment management. The chart was checked and CBCT  were reviewed. He continues to tolerate his treatment  well. He has minimal urinary symptoms and no real bowel complaints at this time. He has noticed some mild fatigue. He has had some mild urinary stress incontinence.  Review of patient's allergies indicates no known allergies.  Current Outpatient Prescriptions  Medication Sig Dispense Refill  . carvedilol (COREG) 6.25 MG tablet Take 6.25 mg by mouth 2 (two) times daily with a meal.      . ferrous sulfate 325 (65 FE) MG tablet Take 325 mg by mouth daily with breakfast.      . fish oil-omega-3 fatty acids 1000 MG capsule Take 2 g by mouth daily.      Marland Kitchen glipiZIDE (GLUCOTROL) 10 MG tablet Take 10 mg by mouth BID times 48H.      Marland Kitchen glipiZIDE-metformin (METAGLIP) 5-500 MG per tablet Take 1 tablet by mouth 2 (two) times daily before a meal.      . lisinopril (PRINIVIL,ZESTRIL) 5 MG tablet Take 5 mg by mouth daily.      . metFORMIN (GLUCOPHAGE) 500 MG tablet Take 500 mg by mouth Twice daily. 2 tabs bid      . nitroGLYCERIN (NITROSTAT) 0.4 MG SL tablet Place 0.4 mg under the tongue every 5 (five) minutes as needed.      Marland Kitchen omeprazole (PRILOSEC) 20 MG capsule Take 20 mg by mouth Daily.       . penicillin v potassium (VEETID) 500 MG tablet       . PRAVASTATIN SODIUM PO Take 20 mg by mouth daily.      . quinapril (ACCUPRIL) 20 MG tablet Take 20 mg by mouth daily.      . Saw Palmetto 1000 MG CAPS Take 1,000 mg by mouth daily.       Labs:  Lab Results  Component Value Date   WBC 6.0 04/02/2010   HGB 11.2* 04/02/2010   HCT 32.6* 04/02/2010   MCV 100.8* 04/02/2010   PLT 265.0 04/02/2010   Lab Results  Component Value Date   CREATININE 0.79 11/21/2007   BUN 8 11/21/2007   NA 137 11/21/2007   K 3.6 11/21/2007   CL 105 11/21/2007   CO2 27 11/21/2007   Lab Results  Component Value Date   ALT 23 02/23/2010   AST 27 02/23/2010   BILITOT 0.8 02/23/2010    Physical Examination:  weight is 214 lb 14.4 oz (97.478 kg). His temperature is 98.5 F (  36.9 C). His blood pressure is 155/77 and his pulse is 63.    Wt Readings from Last 3 Encounters:  06/23/12 214 lb 14.4 oz (97.478 kg)  06/16/12 215 lb 3.2 oz (97.614 kg)  06/09/12 214 lb 8 oz (97.297 kg)     Lungs - Normal respiratory effort, chest expands symmetrically. Lungs are clear to auscultation, no crackles or wheezes.  Heart has regular rhythm and rate  Abdomen is soft and non tender with normal bowel sounds  Assessment:  Patient tolerating treatments well  Plan: Continue treatment per original radiation prescription  to 7800 cGy.  -----------------------------------  Billie Lade, PhD, MD

## 2012-06-24 ENCOUNTER — Ambulatory Visit
Admission: RE | Admit: 2012-06-24 | Discharge: 2012-06-24 | Disposition: A | Discharge status: Home / Self Care | Payer: 59 | Source: Ambulatory Visit | Attending: Radiation Oncology | Admitting: Radiation Oncology

## 2012-06-25 ENCOUNTER — Ambulatory Visit: Payer: 59

## 2012-06-26 ENCOUNTER — Ambulatory Visit
Admission: RE | Admit: 2012-06-26 | Discharge: 2012-06-26 | Disposition: A | Discharge status: Home / Self Care | Payer: 59 | Source: Ambulatory Visit | Attending: Radiation Oncology | Admitting: Radiation Oncology

## 2012-06-29 ENCOUNTER — Ambulatory Visit
Admission: RE | Admit: 2012-06-29 | Discharge: 2012-06-29 | Disposition: A | Discharge status: Home / Self Care | Payer: 59 | Source: Ambulatory Visit | Attending: Radiation Oncology | Admitting: Radiation Oncology

## 2012-06-30 ENCOUNTER — Ambulatory Visit
Admission: RE | Admit: 2012-06-30 | Discharge: 2012-06-30 | Disposition: A | Discharge status: Home / Self Care | Payer: 59 | Source: Ambulatory Visit | Attending: Radiation Oncology | Admitting: Radiation Oncology

## 2012-06-30 VITALS — BP 149/78 | HR 72 | Temp 98.6°F | Wt 215.6 lb

## 2012-06-30 DIAGNOSIS — C61 Malignant neoplasm of prostate: Secondary | ICD-10-CM

## 2012-06-30 NOTE — Progress Notes (Signed)
[facility]    Radiation Oncology [address] [place]     Maryln Gottron, M.D. [place], [place] [address]               Billie Lade, M.D., Ph.D. Phone: [phone]      Molli Hazard A. Kathrynn Running, M.D. Fax: [phone]      Radene Gunning, M.D., Ph.D.         Lurline Hare, M.D.         Grayland Jack, M.D Weekly Treatment Management Note  Name: Clayton [name]     MRN: [id number]        CSN: [id number] Date: 06/30/2012      DOB: [birth year]  CC: No primary provider on file.         Wrenn    Status: Outpatient  Diagnosis: The encounter diagnosis was Prostate cancer.  Current Dose: 7410 cGy  Current Fraction: 38/40  Planned Dose: 7800 cGy  Narrative: [name] was seen today for weekly treatment management. The chart was checked and CBCT  were reviewed.  He is having some fatigue however and did miss work 1 day last week.  He has had some intermittent diarrhea over the past 7 days. He denies any significant urinary symptoms  Review of patient's allergies indicates no known allergies.  Current Outpatient Prescriptions  Medication Sig Dispense Refill  . carvedilol (COREG) 6.25 MG tablet Take 6.25 mg by mouth 2 (two) times daily with a meal.      . ferrous sulfate 325 (65 FE) MG tablet Take 325 mg by mouth daily with breakfast.      . fish oil-omega-3 fatty acids 1000 MG capsule Take 2 g by mouth daily.      Marland Kitchen glipiZIDE (GLUCOTROL) 10 MG tablet Take 10 mg by mouth BID times 48H.      Marland Kitchen glipiZIDE-metformin (METAGLIP) 5-500 MG per tablet Take 1 tablet by mouth 2 (two) times daily before a meal.      . lisinopril (PRINIVIL,ZESTRIL) 5 MG tablet Take 5 mg by mouth daily.      . metFORMIN (GLUCOPHAGE) 500 MG tablet Take 500 mg by mouth Twice daily. 2 tabs bid      . nitroGLYCERIN (NITROSTAT) 0.4 MG SL tablet Place 0.4 mg under the tongue every 5 (five) minutes as needed.      Marland Kitchen omeprazole (PRILOSEC) 20 MG capsule Take 20 mg by mouth Daily.      . penicillin v  potassium (VEETID) 500 MG tablet       . PRAVASTATIN SODIUM PO Take 20 mg by mouth daily.      . quinapril (ACCUPRIL) 20 MG tablet Take 20 mg by mouth daily.      . Saw Palmetto 1000 MG CAPS Take 1,000 mg by mouth daily.       Labs:  Lab Results  Component Value Date   WBC 6.0 04/02/2010   HGB 11.2* 04/02/2010   HCT 32.6* 04/02/2010   MCV 100.8* 04/02/2010   PLT 265.0 04/02/2010   Lab Results  Component Value Date   CREATININE 0.79 11/21/2007   BUN 8 11/21/2007   NA 137 11/21/2007   K 3.6 11/21/2007   CL 105 11/21/2007   CO2 27 11/21/2007   Lab Results  Component Value Date   ALT 23 02/23/2010   AST 27 02/23/2010   BILITOT 0.8 02/23/2010    Physical Examination:  weight is 215 lb 9.6 oz (97.796 kg). His temperature is 98.6 F (37  C). His blood pressure is 149/78 and his pulse is 72.    Wt Readings from Last 3 Encounters:  06/30/12 215 lb 9.6 oz (97.796 kg)  06/23/12 214 lb 14.4 oz (97.478 kg)  06/16/12 215 lb 3.2 oz (97.614 kg)     Lungs - Normal respiratory effort, chest expands symmetrically. Lungs are clear to auscultation, no crackles or wheezes.  Heart has regular rhythm and rate  Abdomen is soft and non tender with normal bowel sounds  Assessment:  Patient tolerating treatments well  Plan: Continue treatment per original radiation prescription

## 2012-06-30 NOTE — Progress Notes (Signed)
Patient here for routine weekly under treat  Visit for prostate radiation.Completes 38 of 40 treatments.Denies pain or any other urinary changes.Bowels normal. Generalized fatigue but able to work and perform activities without difficulty.

## 2012-07-01 ENCOUNTER — Telehealth: Payer: Self-pay

## 2012-07-01 ENCOUNTER — Ambulatory Visit
Admission: RE | Admit: 2012-07-01 | Discharge: 2012-07-01 | Disposition: A | Discharge status: Home / Self Care | Payer: 59 | Source: Ambulatory Visit | Attending: Radiation Oncology | Admitting: Radiation Oncology

## 2012-07-01 NOTE — Telephone Encounter (Signed)
[employer] notified and verified that FMLA papers have been received this week.If any other information needed their office will notify us.

## 2012-07-02 ENCOUNTER — Ambulatory Visit
Admission: RE | Admit: 2012-07-02 | Discharge: 2012-07-02 | Disposition: A | Discharge status: Home / Self Care | Payer: 59 | Source: Ambulatory Visit | Attending: Radiation Oncology | Admitting: Radiation Oncology

## 2012-07-08 ENCOUNTER — Encounter: Payer: Self-pay | Admitting: Radiation Oncology

## 2012-07-08 NOTE — Progress Notes (Signed)
   Department of Radiation Oncology  Phone:  [phone] Fax:        [phone]  Intensity modulated radiation therapy simulation note  On 05/04/2012 the patient had completion of his intensity modulated radiation therapy plan. Intensity modulated radiation therapy was chosen over conventional or conformal radiation therapy to more accurately target the prostate and to limit dose to normal surrounding critical structures i.e. the bladder and rectum.   dose volume histograms of the target area as well as critical structures are reviewed acccepted and placed in the patient's chart. plan is for the patient to receive 40 treatments for a cumulative dose to the prostate of 7800 centigrade.  -----------------------------------  Billie Lade, PhD, MD

## 2012-07-08 NOTE — Progress Notes (Signed)
  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   He had minimal urinary and bowel complaints.  He had some fatigue and did miss a few workdays in light of this issue.  Plan: The patient has completed radiation treatment. The patient will return to radiation oncology clinic for routine followup in one month. I advised them to call or return sooner if they have any questions or concerns related to their recovery or treatment.  -----------------------------------  Billie Lade, PhD, MD

## 2012-08-03 ENCOUNTER — Ambulatory Visit
Admission: RE | Admit: 2012-08-03 | Discharge: 2012-08-03 | Disposition: A | Discharge status: Home / Self Care | Payer: 59 | Source: Ambulatory Visit | Attending: Radiation Oncology | Admitting: Radiation Oncology

## 2012-08-03 ENCOUNTER — Encounter: Payer: Self-pay | Admitting: Radiation Oncology

## 2012-08-03 VITALS — BP 168/97 | HR 83 | Temp 98.5°F | Wt 214.0 lb

## 2012-08-03 DIAGNOSIS — C61 Malignant neoplasm of prostate: Secondary | ICD-10-CM

## 2012-08-03 NOTE — Progress Notes (Signed)
  Radiation Oncology         [phone] ________________________________  Name: Clayton [name] MRN: [id number]  Date: 08/03/2012  DOB: 04/20/1951  Follow-Up Visit Note  CC: No primary provider on file.  Anner Crete, MD  Diagnosis:   Stage TIC Gleason 7 adenocarcinoma of the prostate  Interval Since Last Radiation:  1 months, he completed 7800 cGy to the prostate region  Narrative:  The patient returns today for routine follow-up.  He seems to be doing well at this time except for mild fatigue. The patient denies any hematuria,  dysuria or bowel complaints.  He is able to work his usual schedule.                              ALLERGIES:   has no known allergies.  Meds: Current Outpatient Prescriptions  Medication Sig Dispense Refill  . carvedilol (COREG) 6.25 MG tablet Take 6.25 mg by mouth 2 (two) times daily with a meal.      . ferrous sulfate 325 (65 FE) MG tablet Take 325 mg by mouth daily with breakfast.      . fish oil-omega-3 fatty acids 1000 MG capsule Take 2 g by mouth daily.      Marland Kitchen glipiZIDE (GLUCOTROL) 10 MG tablet Take 10 mg by mouth BID times 48H.      Marland Kitchen glipiZIDE-metformin (METAGLIP) 5-500 MG per tablet Take 1 tablet by mouth 2 (two) times daily before a meal.      . lisinopril (PRINIVIL,ZESTRIL) 5 MG tablet Take 5 mg by mouth daily.      . metFORMIN (GLUCOPHAGE) 500 MG tablet Take 500 mg by mouth Twice daily. 2 tabs bid      . nitroGLYCERIN (NITROSTAT) 0.4 MG SL tablet Place 0.4 mg under the tongue every 5 (five) minutes as needed.      Marland Kitchen omeprazole (PRILOSEC) 20 MG capsule Take 20 mg by mouth Daily.      . penicillin v potassium (VEETID) 500 MG tablet       . PRAVASTATIN SODIUM PO Take 20 mg by mouth daily.      . quinapril (ACCUPRIL) 20 MG tablet Take 20 mg by mouth daily.      . Saw Palmetto 1000 MG CAPS Take 1,000 mg by mouth daily.        Physical Findings: The patient is in no acute distress. Patient is alert and oriented.  weight is 214 lb (97.07 kg).  His temperature is 98.5 F (36.9 C). His blood pressure is 168/97 and his pulse is 83. Marland Kitchen  No palpable supraclavicular adenopathy.   the lungs are clear to auscultation. The heart has a regular rhythm and rate  Lab Findings: Lab Results  Component Value Date   WBC 6.0 04/02/2010   HGB 11.2* 04/02/2010   HCT 32.6* 04/02/2010   MCV 100.8* 04/02/2010   PLT 265.0 04/02/2010    @LASTCHEM @  Radiographic Findings: No results found.  Impression:  The patient is recovering from the effects of radiation.    Plan:  Routine followup in 6 months. The patient will see Dr. Annabell Howells in early February for exam and  PSA blood test.  _____________________________________    Billie Lade, PhD, MD

## 2012-08-03 NOTE — Progress Notes (Signed)
Patient here routine one month follow up completion of prostate radiation.Intermittent frequency and urgency of urination.No nocturia.Bowels normal and regular.

## 2013-06-23 ENCOUNTER — Encounter: Payer: Self-pay | Admitting: Cardiology

## 2013-06-23 ENCOUNTER — Ambulatory Visit (INDEPENDENT_AMBULATORY_CARE_PROVIDER_SITE_OTHER): Payer: 59 | Admitting: Cardiology

## 2013-06-23 VITALS — BP 144/84 | HR 82 | Ht 72.0 in | Wt 210.0 lb

## 2013-06-23 DIAGNOSIS — I2510 Atherosclerotic heart disease of native coronary artery without angina pectoris: Secondary | ICD-10-CM

## 2013-06-23 DIAGNOSIS — I739 Peripheral vascular disease, unspecified: Secondary | ICD-10-CM

## 2013-06-23 MED ORDER — QUINAPRIL HCL 40 MG PO TABS: 40.0000 mg | ORAL_TABLET | Freq: Every day | ORAL | Status: DC

## 2013-06-23 NOTE — Patient Instructions (Signed)
Your physician has recommended you make the following change in your medication: increase Quinapril to 40 mg daily  Your physician has requested that you have a lower or upper extremity arterial duplex. This test is an ultrasound of the arteries in the legs or arms. It looks at arterial blood flow in the legs and arms. Allow one hour for Lower and Upper Arterial scans. There are no restrictions or special instructions  Your physician recommends that you schedule a follow-up appointment in: 1 month

## 2013-06-23 NOTE — Progress Notes (Signed)
Patient ID: Clayton [name], male   DOB: March 01, 1951, 62 y.o.   MRN: [id number]    Patient Name: Clayton [name] Date of Encounter: 06/23/2013  Myocardial infarction  HTN (hypertension)  Diabetes mellitus   PVD (peripheral vascular disease)  Hyperlipidemia   SUBJECTIVE  Clayton [name] is coming for follow up. He used to be followed by Dr Riley Kill, last seen in April 2013 in for followup.   Mr Clayton [name] is a 62 year old male with h/o hypertension, hyperlipidemia, ongoing smoking with h/o CAD, s/p PCI in 2009. The patient has been asymptomatic since then. He is not using aspirin due to allergy. Denies any CO or SOB. He is complaining of claudications with brisk walk, after walking about quarter mile. He also complains of erectile dysfunction that is not improved with use of Vigra. He underwent radiation therapy for prostate cancer a year ago, currently in remission.  CURRENT MEDS Carvedilol 6.25 MG tablet, Take 6.25 mg by mouth 2 (two) times daily  ferrous sulfate 325 (65 FE) MG tablet,  fish oil-omega-3 fatty acids 1000 MG capsule,  glipiZIDE (GLUCOTROL) 10 MG tablet, Take 10 mg by mouth BID times 48H., Disp: , Rfl:  glipiZIDE-metformin (METAGLIP) 5-500 MG per tablet,  nitroGLYCERIN (NITROSTAT) 0.4 MG SL tablet,  omeprazole (PRILOSEC) 20 MG capsule, PRAVASTATIN SODIUM PO, Take 20 mg by mouth daily., Disp: , Rfl: ;  quinapril (ACCUPRIL) 20 MG tablet, Take 20 mg by mouth daily.,   OBJECTIVE BP 144/82, HR 77  PHYSICAL EXAM  General: Pleasant, NAD. Neuro: Alert and oriented X 3. Moves all extremities spontaneously. Psych: Normal affect. HEENT:  Normal  Neck: Supple without bruits or JVD. Lungs:  Resp regular and unlabored, CTA. Heart: RRR no s3, s4, or murmurs. Abdomen: Soft, non-tender, non-distended, BS + x 4.  Extremities: No clubbing, cyanosis or edema. Radials 2+ and equal, DP, DT weak bilaterally.  ECG  SR, 82 BPM, RBBB, old inferior wall MI   ASSESSMENT AND PLAN  62 year old  male   1. CAD, PCIs in 2009, asymptomatic, continue carvedilol, quinalapril and pravastatin, h/o GI bleed after aspirin use  2. Hypertension - uncontrolled - increase quinalapril to 40 mg daily  3. Lipid profile - none in our records, the patient has a blood draw scheduled at his PCP office on [address], he doesn't wish to have it done here. Continue Pravastatin, followed by PCP  4. Erectile dysfunction - most probably multifuctorial - vascular etiology and s/o radiation therapy to the prostate, he was advised to stop smoking  5. Claudications - we will order B/L LE arterial Duplex  6. Smoking cessation - counseling provided  Follow up in 1 month  Signed, Tobias Alexander, H MD

## 2013-06-29 ENCOUNTER — Ambulatory Visit (HOSPITAL_COMMUNITY): Payer: 59 | Attending: Cardiology

## 2013-06-29 DIAGNOSIS — I10 Essential (primary) hypertension: Secondary | ICD-10-CM | POA: Insufficient documentation

## 2013-06-29 DIAGNOSIS — E119 Type 2 diabetes mellitus without complications: Secondary | ICD-10-CM | POA: Insufficient documentation

## 2013-06-29 DIAGNOSIS — F17200 Nicotine dependence, unspecified, uncomplicated: Secondary | ICD-10-CM | POA: Insufficient documentation

## 2013-06-29 DIAGNOSIS — I2510 Atherosclerotic heart disease of native coronary artery without angina pectoris: Secondary | ICD-10-CM | POA: Insufficient documentation

## 2013-06-29 DIAGNOSIS — I70219 Atherosclerosis of native arteries of extremities with intermittent claudication, unspecified extremity: Secondary | ICD-10-CM

## 2013-06-29 DIAGNOSIS — E785 Hyperlipidemia, unspecified: Secondary | ICD-10-CM | POA: Insufficient documentation

## 2013-06-29 DIAGNOSIS — I739 Peripheral vascular disease, unspecified: Secondary | ICD-10-CM | POA: Insufficient documentation

## 2013-06-29 DIAGNOSIS — R209 Unspecified disturbances of skin sensation: Secondary | ICD-10-CM | POA: Insufficient documentation

## 2013-07-13 ENCOUNTER — Telehealth: Payer: Self-pay | Admitting: Nurse Practitioner

## 2013-07-13 NOTE — Telephone Encounter (Signed)
Called patient with results of lower extremity arterial duplex; patient verbalized understanding.  Dr. Delton See advised patient make appointment for [facility] consult with Dr. Kirke Corin.  Patient verbalized agreement but states he does not have his calendar and he has cataract surgery coming up but can't remember date.  Patient will call back to schedule appointment.

## 2013-07-15 ENCOUNTER — Encounter: Payer: Self-pay | Admitting: Cardiology

## 2013-07-26 ENCOUNTER — Ambulatory Visit: Payer: 59 | Admitting: Cardiology

## 2013-08-06 ENCOUNTER — Encounter (HOSPITAL_COMMUNITY): Payer: Self-pay | Admitting: Pharmacy Technician

## 2013-08-09 ENCOUNTER — Other Ambulatory Visit: Payer: Self-pay

## 2013-08-09 ENCOUNTER — Encounter (HOSPITAL_COMMUNITY)
Admission: RE | Admit: 2013-08-09 | Discharge: 2013-08-09 | Disposition: A | Discharge status: Home / Self Care | Payer: 59 | Source: Ambulatory Visit | Attending: Ophthalmology | Admitting: Ophthalmology

## 2013-08-09 ENCOUNTER — Encounter (HOSPITAL_COMMUNITY): Payer: Self-pay

## 2013-08-09 DIAGNOSIS — Z01818 Encounter for other preprocedural examination: Secondary | ICD-10-CM | POA: Insufficient documentation

## 2013-08-09 DIAGNOSIS — Z01812 Encounter for preprocedural laboratory examination: Secondary | ICD-10-CM | POA: Insufficient documentation

## 2013-08-09 DIAGNOSIS — Z01810 Encounter for preprocedural cardiovascular examination: Secondary | ICD-10-CM | POA: Insufficient documentation

## 2013-08-09 LAB — BASIC METABOLIC PANEL
GFR calc Af Amer: >90 mL/min (ref >90)
GFR calc non Af Amer: >90 mL/min (ref >90)
Glucose, Bld: 164 mg/dL — ABNORMAL HIGH (ref 70–99)
Potassium: 4.2 mEq/L (ref 3.5–5.1)
Sodium: 139 mEq/L (ref 135–145)

## 2013-08-09 LAB — HEMOGLOBIN AND HEMATOCRIT, BLOOD: Hemoglobin: 14.1 g/dL (ref 13.0–17.0)

## 2013-08-09 NOTE — Patient Instructions (Signed)
Your procedure is scheduled on: 08/19/2013  Report to [facility] at  940   AM.  Call this number if you have problems the morning of surgery: [phone]   Do not eat food or drink liquids :After Midnight.      Take these medicines the morning of surgery with A SIP OF WATER: coreg, prilosec, accupril   Do not wear jewelry, make-up or nail polish.  Do not wear lotions, powders, or perfumes.   Do not shave 48 hours prior to surgery.  Do not bring valuables to the hospital.  Contacts, dentures or bridgework may not be worn into surgery.  Leave suitcase in the car. After surgery it may be brought to your room.  For patients admitted to the hospital, checkout time is 11:00 AM the day of discharge.   Patients discharged the day of surgery will not be allowed to drive home.  :     Please read over the following fact sheets that you were given: Coughing and Deep Breathing, Surgical Site Infection Prevention, Anesthesia Post-op Instructions and Care and Recovery After Surgery    Cataract A cataract is a clouding of the lens of the eye. When a lens becomes cloudy, vision is reduced based on the degree and nature of the clouding. Many cataracts reduce vision to some degree. Some cataracts make people more near-sighted as they develop. Other cataracts increase glare. Cataracts that are ignored and become worse can sometimes look white. The white color can be seen through the pupil. CAUSES   Aging. However, cataracts may occur at any age, even in newborns.   Certain drugs.   Trauma to the eye.   Certain diseases such as diabetes.   Specific eye diseases such as chronic inflammation inside the eye or a sudden attack of a rare form of glaucoma.   Inherited or acquired medical problems.  SYMPTOMS   Gradual, progressive drop in vision in the affected eye.   Severe, rapid visual loss. This most often happens when trauma is the cause.  DIAGNOSIS  To detect a cataract, an eye doctor examines the  lens. Cataracts are best diagnosed with an exam of the eyes with the pupils enlarged (dilated) by drops.  TREATMENT  For an early cataract, vision may improve by using different eyeglasses or stronger lighting. If that does not help your vision, surgery is the only effective treatment. A cataract needs to be surgically removed when vision loss interferes with your everyday activities, such as driving, reading, or watching TV. A cataract may also have to be removed if it prevents examination or treatment of another eye problem. Surgery removes the cloudy lens and usually replaces it with a substitute lens (intraocular lens, IOL).  At a time when both you and your doctor agree, the cataract will be surgically removed. If you have cataracts in both eyes, only one is usually removed at a time. This allows the operated eye to heal and be out of danger from any possible problems after surgery (such as infection or poor wound healing). In rare cases, a cataract may be doing damage to your eye. In these cases, your caregiver may advise surgical removal right away. The vast majority of people who have cataract surgery have better vision afterward. HOME CARE INSTRUCTIONS  If you are not planning surgery, you may be asked to do the following:  Use different eyeglasses.   Use stronger or brighter lighting.   Ask your eye doctor about reducing your medicine dose or  changing medicines if it is thought that a medicine caused your cataract. Changing medicines does not make the cataract go away on its own.   Become familiar with your surroundings. Poor vision can lead to injury. Avoid bumping into things on the affected side. You are at a higher risk for tripping or falling.   Exercise extreme care when driving or operating machinery.   Wear sunglasses if you are sensitive to bright light or experiencing problems with glare.  SEEK IMMEDIATE MEDICAL CARE IF:   You have a worsening or sudden vision loss.   You  notice redness, swelling, or increasing pain in the eye.   You have a fever.  Document Released: 09/02/2005 Document Revised: 08/22/2011 Document Reviewed: 04/26/2011 [facility] Patient Information 2012 [place].PATIENT INSTRUCTIONS POST-ANESTHESIA  IMMEDIATELY FOLLOWING SURGERY:  Do not drive or operate machinery for the first twenty four hours after surgery.  Do not make any important decisions for twenty four hours after surgery or while taking narcotic pain medications or sedatives.  If you develop intractable nausea and vomiting or a severe headache please notify your doctor immediately.  FOLLOW-UP:  Please make an appointment with your surgeon as instructed. You do not need to follow up with anesthesia unless specifically instructed to do so.  WOUND CARE INSTRUCTIONS (if applicable):  Keep a dry clean dressing on the anesthesia/puncture wound site if there is drainage.  Once the wound has quit draining you may leave it open to air.  Generally you should leave the bandage intact for twenty four hours unless there is drainage.  If the epidural site drains for more than 36-48 hours please call the anesthesia department.  QUESTIONS?:  Please feel free to call your physician or the hospital operator if you have any questions, and they will be happy to assist you.

## 2013-08-18 MED ORDER — NEOMYCIN-POLYMYXIN-DEXAMETH 3.5-10000-0.1 OP SUSP
OPHTHALMIC | Status: AC
  Filled 2013-08-18: qty 5

## 2013-08-18 MED ORDER — CYCLOPENTOLATE-PHENYLEPHRINE OP SOLN OPTIME - NO CHARGE
OPHTHALMIC | Status: AC
  Filled 2013-08-18: qty 2

## 2013-08-18 MED ORDER — PHENYLEPHRINE HCL 2.5 % OP SOLN
OPHTHALMIC | Status: AC
  Filled 2013-08-18: qty 15

## 2013-08-18 MED ORDER — LIDOCAINE HCL (PF) 1 % IJ SOLN
INTRAMUSCULAR | Status: AC
  Filled 2013-08-18: qty 2

## 2013-08-18 MED ORDER — LIDOCAINE HCL 3.5 % OP GEL
OPHTHALMIC | Status: AC
  Filled 2013-08-18: qty 1

## 2013-08-18 MED ORDER — TETRACAINE HCL 0.5 % OP SOLN
OPHTHALMIC | Status: AC
  Filled 2013-08-18: qty 2

## 2013-08-19 ENCOUNTER — Encounter (HOSPITAL_COMMUNITY): Payer: Self-pay | Admitting: *Deleted

## 2013-08-19 ENCOUNTER — Ambulatory Visit (HOSPITAL_COMMUNITY)
Admission: RE | Admit: 2013-08-19 | Discharge: 2013-08-19 | Disposition: A | Discharge status: Home / Self Care | Payer: 59 | Source: Ambulatory Visit | Attending: Ophthalmology | Admitting: Ophthalmology

## 2013-08-19 ENCOUNTER — Ambulatory Visit (HOSPITAL_COMMUNITY): Payer: 59 | Admitting: Anesthesiology

## 2013-08-19 ENCOUNTER — Encounter (HOSPITAL_COMMUNITY): Payer: 59 | Admitting: Anesthesiology

## 2013-08-19 ENCOUNTER — Encounter (HOSPITAL_COMMUNITY)
Admission: RE | Disposition: A | Discharge status: Home / Self Care | Payer: Self-pay | Source: Ambulatory Visit | Attending: Ophthalmology

## 2013-08-19 DIAGNOSIS — E119 Type 2 diabetes mellitus without complications: Secondary | ICD-10-CM | POA: Insufficient documentation

## 2013-08-19 DIAGNOSIS — I10 Essential (primary) hypertension: Secondary | ICD-10-CM | POA: Insufficient documentation

## 2013-08-19 DIAGNOSIS — Z01812 Encounter for preprocedural laboratory examination: Secondary | ICD-10-CM | POA: Insufficient documentation

## 2013-08-19 DIAGNOSIS — IMO0002 Reserved for concepts with insufficient information to code with codable children: Secondary | ICD-10-CM | POA: Insufficient documentation

## 2013-08-19 HISTORY — PX: CATARACT EXTRACTION W/PHACO: SHX586

## 2013-08-19 HISTORY — DX: Malignant neoplasm of prostate: C61

## 2013-08-19 SURGERY — PHACOEMULSIFICATION, CATARACT, WITH IOL INSERTION
Anesthesia: Monitor Anesthesia Care | Site: Eye | Laterality: Left

## 2013-08-19 MED ORDER — LACTATED RINGERS IV SOLN
INTRAVENOUS | Status: DC
  Administered 2013-08-19: 11:00:00 via INTRAVENOUS

## 2013-08-19 MED ORDER — TRYPAN BLUE 0.06 % OP SOLN
OPHTHALMIC | Status: AC
  Filled 2013-08-19: qty 0.5

## 2013-08-19 MED ORDER — EPINEPHRINE HCL 1 MG/ML IJ SOLN
INTRAMUSCULAR | Status: DC | PRN
  Administered 2013-08-19: 11:00:00

## 2013-08-19 MED ORDER — TRYPAN BLUE 0.06 % OP SOLN
OPHTHALMIC | Status: DC | PRN
  Administered 2013-08-19: 0.5 mL via INTRAOCULAR

## 2013-08-19 MED ORDER — FENTANYL CITRATE 0.05 MG/ML IJ SOLN
25.0000 ug | INTRAMUSCULAR | Status: AC
  Administered 2013-08-19 (×2): 25 ug via INTRAVENOUS

## 2013-08-19 MED ORDER — PHENYLEPHRINE HCL 2.5 % OP SOLN
1.0000 [drp] | OPHTHALMIC | Status: AC
  Administered 2013-08-19 (×3): 1 [drp] via OPHTHALMIC

## 2013-08-19 MED ORDER — POVIDONE-IODINE 5 % OP SOLN
OPHTHALMIC | Status: DC | PRN
  Administered 2013-08-19: 1 via OPHTHALMIC

## 2013-08-19 MED ORDER — NEOMYCIN-POLYMYXIN-DEXAMETH 3.5-10000-0.1 OP SUSP
OPHTHALMIC | Status: DC | PRN
  Administered 2013-08-19: 2 [drp] via OPHTHALMIC

## 2013-08-19 MED ORDER — CYCLOPENTOLATE-PHENYLEPHRINE 0.2-1 % OP SOLN
1.0000 [drp] | OPHTHALMIC | Status: AC
  Administered 2013-08-19 (×3): 1 [drp] via OPHTHALMIC

## 2013-08-19 MED ORDER — LACTATED RINGERS IV SOLN
INTRAVENOUS | Status: DC | PRN
  Administered 2013-08-19: 11:00:00 via INTRAVENOUS

## 2013-08-19 MED ORDER — MIDAZOLAM HCL 2 MG/2ML IJ SOLN
INTRAMUSCULAR | Status: AC
  Filled 2013-08-19: qty 2

## 2013-08-19 MED ORDER — BSS IO SOLN
INTRAOCULAR | Status: DC | PRN
  Administered 2013-08-19: 15 mL via INTRAOCULAR

## 2013-08-19 MED ORDER — TETRACAINE HCL 0.5 % OP SOLN
1.0000 [drp] | OPHTHALMIC | Status: AC
  Administered 2013-08-19 (×3): 1 [drp] via OPHTHALMIC

## 2013-08-19 MED ORDER — FENTANYL CITRATE 0.05 MG/ML IJ SOLN
INTRAMUSCULAR | Status: AC
  Filled 2013-08-19: qty 2

## 2013-08-19 MED ORDER — EPINEPHRINE HCL 1 MG/ML IJ SOLN
INTRAMUSCULAR | Status: AC
  Filled 2013-08-19: qty 1

## 2013-08-19 MED ORDER — LIDOCAINE HCL 3.5 % OP GEL
1.0000 "application " | Freq: Once | OPHTHALMIC | Status: AC
  Administered 2013-08-19: 1 via OPHTHALMIC

## 2013-08-19 MED ORDER — PROVISC 10 MG/ML IO SOLN
INTRAOCULAR | Status: DC | PRN
  Administered 2013-08-19: 0.85 mL via INTRAOCULAR

## 2013-08-19 MED ORDER — LIDOCAINE HCL (PF) 1 % IJ SOLN
INTRAOCULAR | Status: DC | PRN
  Administered 2013-08-19: 11:00:00 via OPHTHALMIC

## 2013-08-19 MED ORDER — LIDOCAINE 3.5 % OP GEL OPTIME - NO CHARGE
OPHTHALMIC | Status: DC | PRN
  Administered 2013-08-19: 2 [drp] via OPHTHALMIC

## 2013-08-19 MED ORDER — MIDAZOLAM HCL 2 MG/2ML IJ SOLN
1.0000 mg | INTRAMUSCULAR | Status: DC | PRN
  Administered 2013-08-19: 2 mg via INTRAVENOUS

## 2013-08-19 SURGICAL SUPPLY — 32 items
CAPSULAR TENSION RING-AMO (OPHTHALMIC RELATED) IMPLANT
CLOTH BEACON ORANGE TIMEOUT ST (SAFETY) ×1 IMPLANT
EYE SHIELD UNIVERSAL CLEAR (GAUZE/BANDAGES/DRESSINGS) ×1 IMPLANT
GLOVE BIO SURGEON STRL SZ 6.5 (GLOVE) IMPLANT
GLOVE BIOGEL PI IND STRL 6.5 (GLOVE) IMPLANT
GLOVE BIOGEL PI IND STRL 7.0 (GLOVE) IMPLANT
GLOVE BIOGEL PI IND STRL 7.5 (GLOVE) IMPLANT
GLOVE BIOGEL PI INDICATOR 6.5 (GLOVE) ×1
GLOVE BIOGEL PI INDICATOR 7.0 (GLOVE)
GLOVE BIOGEL PI INDICATOR 7.5 (GLOVE)
GLOVE ECLIPSE 6.5 STRL STRAW (GLOVE) IMPLANT
GLOVE ECLIPSE 7.0 STRL STRAW (GLOVE) IMPLANT
GLOVE ECLIPSE 7.5 STRL STRAW (GLOVE) IMPLANT
GLOVE EXAM NITRILE LRG STRL (GLOVE) IMPLANT
GLOVE EXAM NITRILE MD LF STRL (GLOVE) ×1 IMPLANT
GLOVE SKINSENSE NS SZ6.5 (GLOVE)
GLOVE SKINSENSE NS SZ7.0 (GLOVE)
GLOVE SKINSENSE STRL SZ6.5 (GLOVE) IMPLANT
GLOVE SKINSENSE STRL SZ7.0 (GLOVE) IMPLANT
KIT VITRECTOMY (OPHTHALMIC RELATED) IMPLANT
PAD ARMBOARD 7.5X6 YLW CONV (MISCELLANEOUS) ×1 IMPLANT
PROC W NO LENS (INTRAOCULAR LENS)
PROC W SPEC LENS (INTRAOCULAR LENS)
PROCESS W NO LENS (INTRAOCULAR LENS) IMPLANT
PROCESS W SPEC LENS (INTRAOCULAR LENS) IMPLANT
RING MALYGIN (MISCELLANEOUS) ×1 IMPLANT
SIGHTPATH CAT PROC W REG LENS (Ophthalmic Related) ×2 IMPLANT
SYR TB 1ML LL NO SAFETY (SYRINGE) ×1 IMPLANT
TAPE SURG TRANSPORE 1 IN (GAUZE/BANDAGES/DRESSINGS) IMPLANT
TAPE SURGICAL TRANSPORE 1 IN (GAUZE/BANDAGES/DRESSINGS) ×1
VISCOELASTIC ADDITIONAL (OPHTHALMIC RELATED) IMPLANT
WATER STERILE IRR 250ML POUR (IV SOLUTION) ×1 IMPLANT

## 2013-08-19 NOTE — Preoperative (Signed)
Beta Blockers   Reason not to administer Beta Blockers:Not Applicable 

## 2013-08-19 NOTE — Op Note (Signed)
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  Assistants: None  Anesthesia: Topical with MAC  Indications: Painless, progressive loss of vision with compromise of daily activities.  Surgery: Cataract Extraction with Intraocular lens Implant Left Eye  Discription: The patient had dilating drops and viscous lidocaine placed into the left eye in the pre-op holding area. After transfer to the operating room, a time out was performed. The patient was then prepped and draped. Beginning with a 75 degree blade a paracentesis port was made at the surgeon's 2 o'clock position. The anterior chamber was then filled with 1% non-preserved lidocaine. The anterior chamber was then filled with Vision Blue to stain the anterior capsule. The Vision Blue was displaced from the anterior chamber with BSS. This was followed by filling the anterior chamber with Viscoat. A Malyugan ring was placed to dilate the pupil. A 2.72mm keratome blade was used to make a clear corneal incision at the temporal limbus. A bent cystatome needle was used to create a continuous tear capsulotomy. Hydrodissection was performed with balanced salt solution on a Fine canula. The lens nucleus was then removed using the phacoemulsification handpiece. Residual cortex was removed with the I&A handpiece. The anterior chamber and capsular bag were refilled with Provisc. A posterior chamber intraocular lens was placed into the capsular bag with it's injector. The implant was positioned with the Kuglan hook. The Malyugan ring was removed.  The Provisc was then removed from the anterior chamber and capsular bag with the I&A handpiece. Stromal hydration of the main incision and paracentesis port was performed with BSS on a Fine canula. The wounds were tested for leak which was  negative. The patient tolerated the procedure well. There were no operative complications. The patient was then transferred to the recovery room in stable condition.  Complications: None  Specimen: None  EBL: None  Prosthetic device: B&L enVista, MX60, power 18.5D, SN [id number].

## 2013-08-19 NOTE — H&P (Signed)
I have reviewed the H&P, the patient was re-examined, and I have identified no interval changes in medical condition and plan of care since the history and physical of record  

## 2013-08-19 NOTE — Anesthesia Postprocedure Evaluation (Signed)
  Anesthesia Post-op Note  Patient: Clayton [name]  Procedure(s) Performed: Procedure(s) with comments: CATARACT EXTRACTION PHACO AND INTRAOCULAR LENS PLACEMENT (IOC) (Left) - CDE:37.77  Patient Location: Short Stay  Anesthesia Type:MAC  Level of Consciousness: awake, alert , oriented and patient cooperative  Airway and Oxygen Therapy: Patient Spontanous Breathing  Post-op Pain: none  Post-op Assessment: Post-op Vital signs reviewed, Patient's Cardiovascular Status Stable, Respiratory Function Stable, Patent Airway, No signs of Nausea or vomiting and Pain level controlled  Post-op Vital Signs: Reviewed and stable  Complications: No apparent anesthesia complications

## 2013-08-19 NOTE — Transfer of Care (Signed)
Immediate Anesthesia Transfer of Care Note  Patient: Clayton [name]  Procedure(s) Performed: Procedure(s) with comments: CATARACT EXTRACTION PHACO AND INTRAOCULAR LENS PLACEMENT (IOC) (Left) - CDE:37.77  Patient Location: Short Stay  Anesthesia Type:MAC  Level of Consciousness: awake, alert , oriented and patient cooperative  Airway & Oxygen Therapy: Patient Spontanous Breathing  Post-op Assessment: Report given to PACU RN and Post -op Vital signs reviewed and stable  Post vital signs: Reviewed and stable  Complications: No apparent anesthesia complications

## 2013-08-19 NOTE — Anesthesia Procedure Notes (Signed)
Procedure Name: MAC Performed by: ADAMS, AMY A Pre-anesthesia Checklist: Patient identified, Timeout performed, Emergency Drugs available, Suction available and Patient being monitored Oxygen Delivery Method: Nasal cannula     

## 2013-08-19 NOTE — Anesthesia Preprocedure Evaluation (Signed)
Anesthesia Evaluation  Patient identified by MRN, date of birth, ID band Patient awake    Reviewed: Allergy & Precautions, H&P , NPO status , Patient's Chart, lab work & pertinent test results, reviewed documented beta blocker date and time   Airway Mallampati: II TM Distance: >3 FB     Dental  (+) Edentulous Upper and Edentulous Lower   Pulmonary Current Smoker,  breath sounds clear to auscultation        Cardiovascular hypertension, Pt. on medications and Pt. on home beta blockers + CAD, + Past MI, + Cardiac Stents and + Peripheral Vascular Disease Rhythm:Regular Rate:Normal     Neuro/Psych    GI/Hepatic GERD-  Medicated,  Endo/Other  diabetes, Type 2, Oral Hypoglycemic Agents  Renal/GU      Musculoskeletal   Abdominal   Peds  Hematology   Anesthesia Other Findings   Reproductive/Obstetrics                           Anesthesia Physical Anesthesia Plan  ASA: III  Anesthesia Plan: MAC   Post-op Pain Management:    Induction: Intravenous  Airway Management Planned: Nasal Cannula  Additional Equipment:   Intra-op Plan:   Post-operative Plan:   Informed Consent: I have reviewed the patients History and Physical, chart, labs and discussed the procedure including the risks, benefits and alternatives for the proposed anesthesia with the patient or authorized representative who has indicated his/her understanding and acceptance.     Plan Discussed with:   Anesthesia Plan Comments:         Anesthesia Quick Evaluation

## 2013-08-20 ENCOUNTER — Encounter: Payer: Self-pay | Admitting: Cardiology

## 2013-08-23 ENCOUNTER — Encounter (HOSPITAL_COMMUNITY): Payer: Self-pay | Admitting: Ophthalmology

## 2013-09-22 ENCOUNTER — Encounter: Payer: Self-pay | Admitting: Cardiology

## 2013-10-11 ENCOUNTER — Encounter: Payer: Self-pay | Admitting: Cardiology

## 2013-10-11 ENCOUNTER — Ambulatory Visit (INDEPENDENT_AMBULATORY_CARE_PROVIDER_SITE_OTHER): Payer: 59 | Admitting: Cardiology

## 2013-10-11 VITALS — BP 140/100 | HR 83 | Ht 72.0 in | Wt 209.0 lb

## 2013-10-11 DIAGNOSIS — I2510 Atherosclerotic heart disease of native coronary artery without angina pectoris: Secondary | ICD-10-CM

## 2013-10-11 DIAGNOSIS — I739 Peripheral vascular disease, unspecified: Secondary | ICD-10-CM

## 2013-10-11 DIAGNOSIS — F17200 Nicotine dependence, unspecified, uncomplicated: Secondary | ICD-10-CM

## 2013-10-11 NOTE — Patient Instructions (Signed)
Your physician recommends that you continue on your current medications as directed. Please refer to the Current Medication list given to you today.  Your physician wants you to follow-up in: 6 MONTHS with Dr. Meda Coffee. You will receive a reminder letter in the mail two months in advance. If you don't receive a letter, please call our office to schedule the follow-up appointment.

## 2013-10-11 NOTE — Progress Notes (Signed)
Patient ID: Boss Danielsen, male   DOB: June 07, 1951, 63 y.o.   MRN: [id number]     Patient Name: Martrell Eguia Date of Encounter: 10/11/2013  Myocardial infarction  HTN (hypertension)  Diabetes mellitus   PVD (peripheral vascular disease)  Hyperlipidemia   SUBJECTIVE  Mr. Badolato is coming for follow up. He used to be followed by Dr Lia Foyer, last seen in April 2013 in for followup.   Mr Louie Bun is a 63 year old male with h/o hypertension, hyperlipidemia, ongoing smoking with h/o CAD, s/p PCI in 2009. The patient has been asymptomatic since then. He is not using aspirin due to allergy. Denies any CO or SOB. He is complaining of claudications with brisk walk, after walking about quarter mile. He also complains of erectile dysfunction that is not improved with use of Vigra. He underwent radiation therapy for prostate cancer a year ago, currently in remission.  The patient is coming after 3 months, he denies CP or SOB. He continues to smoke and has no desire to stop. He states that since he restarted aspirin his claudications are better. He can walk from the car to church with mild claudications that resolve after short break.  He also complains of poor appetite, dysphasia, mild weight loss for about a week.   CURRENT MEDS Carvedilol 6.25 MG tablet, Take 6.25 mg by mouth 2 (two) times daily  ferrous sulfate 325 (65 FE) MG tablet,  fish oil-omega-3 fatty acids 1000 MG capsule,  glipiZIDE (GLUCOTROL) 10 MG tablet, Take 10 mg by mouth BID times 48H., Disp: , Rfl:  glipiZIDE-metformin (METAGLIP) 5-500 MG per tablet,  nitroGLYCERIN (NITROSTAT) 0.4 MG SL tablet,  omeprazole (PRILOSEC) 20 MG capsule, PRAVASTATIN SODIUM PO, Take 20 mg by mouth daily., Disp: , Rfl: ;  quinapril (ACCUPRIL) 20 MG tablet, Take 20 mg by mouth daily.,   OBJECTIVE BP 144/82, HR 77  PHYSICAL EXAM  General: Pleasant, NAD. Neuro: Alert and oriented X 3. Moves all extremities spontaneously. Psych: Normal affect. HEENT:   Normal  Neck: Supple without bruits or JVD. Lungs:  Resp regular and unlabored, CTA. Heart: RRR no s3, s4, or murmurs. Abdomen: Soft, non-tender, non-distended, BS + x 4.  Extremities: No clubbing, cyanosis or edema. Radials 2+ and equal, DP, DT weak bilaterally.  ECG  SR, 82 BPM, RBBB, old inferior wall MI   ASSESSMENT AND PLAN  63 year old male   1. CAD, PCIs in 2009, asymptomatic, continue carvedilol, quinalapril and pravastatin, restarted aspirin 81 mg po daily  2. Hypertension - uncontrolled -however states that he didn't take his meds today yet  3. Lipid profile - none in our records, the patient has a blood draw scheduled at his PCP office on Monday, he doesn't wish to have it done here. Continue Pravastatin, followed by PCP  4. Erectile dysfunction - most probably multifuctorial - vascular etiology and s/o radiation therapy to the prostate, he was advised to stop smoking  5. Claudications -  arterial Duplex shows B/L PAD, vascular consult is recommended, however he refuses, he states that it got better since he restarted ASA and he can live with it. Doesn't want to give up smoking  6. Smoking cessation - counseling provided  7. Poor appetite, dysphasia, weight loss - we recommended GI consult, the patient is not interested.   Follow up in 6 months.  Signed, Dorothy Spark MD

## 2013-11-27 ENCOUNTER — Other Ambulatory Visit: Payer: Self-pay | Admitting: Cardiology

## 2014-02-25 ENCOUNTER — Other Ambulatory Visit: Payer: Self-pay | Admitting: Cardiology

## 2014-04-11 ENCOUNTER — Encounter: Payer: Self-pay | Admitting: Cardiology

## 2014-04-11 ENCOUNTER — Ambulatory Visit (INDEPENDENT_AMBULATORY_CARE_PROVIDER_SITE_OTHER): Payer: 59 | Admitting: Cardiology

## 2014-04-11 VITALS — BP 172/86 | HR 88 | Ht 72.0 in | Wt 214.4 lb

## 2014-04-11 DIAGNOSIS — Z9861 Coronary angioplasty status: Secondary | ICD-10-CM

## 2014-04-11 DIAGNOSIS — I739 Peripheral vascular disease, unspecified: Secondary | ICD-10-CM

## 2014-04-11 DIAGNOSIS — F17200 Nicotine dependence, unspecified, uncomplicated: Secondary | ICD-10-CM

## 2014-04-11 DIAGNOSIS — N529 Male erectile dysfunction, unspecified: Secondary | ICD-10-CM

## 2014-04-11 DIAGNOSIS — N5201 Erectile dysfunction due to arterial insufficiency: Secondary | ICD-10-CM

## 2014-04-11 DIAGNOSIS — I2510 Atherosclerotic heart disease of native coronary artery without angina pectoris: Secondary | ICD-10-CM

## 2014-04-11 DIAGNOSIS — I10 Essential (primary) hypertension: Secondary | ICD-10-CM

## 2014-04-11 MED ORDER — NEBIVOLOL HCL 10 MG PO TABS: 10.0000 mg | ORAL_TABLET | Freq: Every day | ORAL | Status: DC

## 2014-04-11 NOTE — Progress Notes (Signed)
Patient ID: Clayton [name], male   DOB: [birth year], 63 y.o.   MRN: [id number]    Patient Name: [name] Date of Encounter: 04/11/2014  Myocardial infarction  HTN (hypertension)  Diabetes mellitus   PVD (peripheral vascular disease)  Hyperlipidemia   SUBJECTIVE  [name] is coming for follow up. He used to be followed by Dr Lia Foyer, last seen in April 2013 in for followup.   Mr Clayton [name] is a 63 year old male with h/o hypertension, hyperlipidemia, ongoing smoking with h/o CAD, s/p PCI in 2009. The patient has been asymptomatic since then. He is not using aspirin due to allergy. Denies any CP or SOB. He is complaining of claudications with brisk walk, after walking about quarter mile. He also complains of erectile dysfunction that is not improved with use of Viagra. He underwent radiation therapy for prostate cancer a year ago, currently in remission.  The patient is coming after 3 months, he denies CP or SOB. He continues to smoke and has no desire to stop. He states that since he restarted aspirin his claudications are better. He can walk from the car to church with mild claudications that resolve after short break.  6 months follow up - the patient is still experiencing claudication but is able to work full time. He denies any chest pain or further problems. He is trying to quit smoking, currently smoking one pack a day.  CURRENT MEDS Carvedilol 6.25 MG tablet, Take 6.25 mg by mouth 2 (two) times daily  ferrous sulfate 325 (65 FE) MG tablet,  fish oil-omega-3 fatty acids 1000 MG capsule,  glipiZIDE (GLUCOTROL) 10 MG tablet, Take 10 mg by mouth BID times 48H., Disp: , Rfl:  glipiZIDE-metformin (METAGLIP) 5-500 MG per tablet,  nitroGLYCERIN (NITROSTAT) 0.4 MG SL tablet,  omeprazole (PRILOSEC) 20 MG capsule, PRAVASTATIN SODIUM PO, Take 20 mg by mouth daily., Disp: , Rfl: ;  quinapril (ACCUPRIL) 20 MG tablet, Take 20 mg by mouth daily.,   OBJECTIVE BP 144/82, HR 77  PHYSICAL  EXAM  General: Pleasant, NAD. Neuro: Alert and oriented X 3. Moves all extremities spontaneously. Psych: Normal affect. HEENT:  Normal  Neck: Supple without bruits or JVD. Lungs:  Resp regular and unlabored, CTA. Heart: RRR no s3, s4, or murmurs. Abdomen: Soft, non-tender, non-distended, BS + x 4.  Extremities: No clubbing, cyanosis or edema. Radials 2+ and equal, DP, DT weak bilaterally.  ECG  SR, 82 BPM, RBBB, old inferior wall MI   ASSESSMENT AND PLAN  63 year old male   1. CAD, PCIs in 2009, asymptomatic, continue carvedilol, quinalapril and pravastatin, restarted aspirin 81 mg po daily  2. Hypertension - uncontrolled -we will switch Carvedilol 6.25 BID to nebivolol - Bystolic 5 mg daily - might improve BP, COPD and ED  3. Lipid profile - none in our records, the patient has a blood draw scheduled at his PCP office on Monday, he doesn't wish to have it done here. Continue Pravastatin, followed by PCP  4. Erectile dysfunction - most probably multifuctorial - vascular etiology and s/o radiation therapy to the prostate, he was advised to stop smoking  5. Claudications -  arterial Duplex shows B/L PAD, vascular consult is recommended, however he refuses, he states that it got better since he restarted ASA and he can live with it.  6. Smoking cessation - cutting down  Follow up in 3 months.  Signed, Dorothy Spark MD

## 2014-04-11 NOTE — Patient Instructions (Signed)
Your physician has recommended you make the following change in your medication: stop taking Carvedilol and start taking Bystolic 10 mg daily  Your physician recommends that you schedule a follow-up appointment in: 3 months

## 2014-06-02 ENCOUNTER — Encounter: Payer: Self-pay | Admitting: Internal Medicine

## 2014-07-12 ENCOUNTER — Ambulatory Visit (INDEPENDENT_AMBULATORY_CARE_PROVIDER_SITE_OTHER): Payer: 59 | Admitting: Cardiology

## 2014-07-12 ENCOUNTER — Encounter: Payer: Self-pay | Admitting: Cardiology

## 2014-07-12 VITALS — BP 184/94 | HR 80 | Ht 74.0 in | Wt 211.0 lb

## 2014-07-12 DIAGNOSIS — N521 Erectile dysfunction due to diseases classified elsewhere: Secondary | ICD-10-CM

## 2014-07-12 DIAGNOSIS — E1169 Type 2 diabetes mellitus with other specified complication: Secondary | ICD-10-CM | POA: Insufficient documentation

## 2014-07-12 DIAGNOSIS — R9431 Abnormal electrocardiogram [ECG] [EKG]: Secondary | ICD-10-CM

## 2014-07-12 MED ORDER — SILDENAFIL CITRATE 50 MG PO TABS: 50.0000 mg | ORAL_TABLET | Freq: Every day | ORAL | Status: DC | PRN

## 2014-07-12 MED ORDER — CARVEDILOL 12.5 MG PO TABS: 12.5000 mg | ORAL_TABLET | Freq: Two times a day (BID) | ORAL | Status: DC

## 2014-07-12 NOTE — Patient Instructions (Signed)
Your physician has recommended you make the following change in your medication:     *START TAKING CARVEDILOL 12.5 MG TWICE DAILY  *DR NELSON HAS PRESCRIBED YOU VIAGRA 50 MG- TAKE ONLY AS NEEDED FOR ERECTILE DYSFUNCTION     Your physician recommends that you schedule a follow-up appointment in: [place]

## 2014-07-12 NOTE — Progress Notes (Signed)
Patient ID: Clayton [name], male   DOB: 10/01/1950, 63 y.o.   MRN: [id number]    Patient Name: Clayton [name] Date of Encounter: 07/12/2014  Myocardial infarction  HTN (hypertension)  Diabetes mellitus   PVD (peripheral vascular disease)  Hyperlipidemia   SUBJECTIVE  Clayton [name] is coming for follow up. He used to be followed by Dr Lia Foyer, last seen in April 2013 in for followup.   Mr Clayton [name] is a 63 year old male with h/o hypertension, hyperlipidemia, ongoing smoking with h/o CAD, s/p PCI in 2009. The patient has been asymptomatic since then. He is not using aspirin due to allergy. Denies any CP or SOB. He is complaining of claudications with brisk walk, after walking about quarter mile. He also complains of erectile dysfunction that is not improved with use of Viagra. He underwent radiation therapy for prostate cancer a year ago, currently in remission.  The patient is coming after 3 months, he denies CP or SOB. He continues to smoke and has no desire to stop. He states that since he restarted aspirin his claudications are better. He can walk from the car to church with mild claudications that resolve after short break.  07/13/2014 - the patient states that since he started to use aspirin his claudications have significantly improved and don't impair his quality of life right now. He continues to smoke but states he is trying to quit he states that nebivolol really worked for him but he can't afford it. He complains of erectile dysfunction. He denies any chest pain or shortness of breath on moderate activity.   CURRENT MEDS Carvedilol 6.25 MG tablet, Take 6.25 mg by mouth 2 (two) times daily  ferrous sulfate 325 (65 FE) MG tablet,  fish oil-omega-3 fatty acids 1000 MG capsule,  glipiZIDE (GLUCOTROL) 10 MG tablet, Take 10 mg by mouth BID times 48H., Disp: , Rfl:  glipiZIDE-metformin (METAGLIP) 5-500 MG per tablet,  nitroGLYCERIN (NITROSTAT) 0.4 MG SL tablet,  omeprazole (PRILOSEC) 20 MG  capsule, PRAVASTATIN SODIUM PO, Take 20 mg by mouth daily., Disp: , Rfl: ;  quinapril (ACCUPRIL) 20 MG tablet, Take 20 mg by mouth daily.,   OBJECTIVE BP 144/82, HR 77  PHYSICAL EXAM  General: Pleasant, NAD. Neuro: Alert and oriented X 3. Moves all extremities spontaneously. Psych: Normal affect. HEENT:  Normal  Neck: Supple without bruits or JVD. Lungs:  Resp regular and unlabored, CTA. Heart: RRR no s3, s4, or murmurs. Abdomen: Soft, non-tender, non-distended, BS + x 4.  Extremities: No clubbing, cyanosis or edema. Radials 2+ and equal, DP, DT weak bilaterally.  ECG  SR, 82 BPM, RBBB, unchanged from 08/09/2013  ASSESSMENT AND PLAN  63 year old male   1. CAD, PCIs in 2009, asymptomatic, continue carvedilol, quinalapril and pravastatin, restarted aspirin 81 mg po daily  2. Hypertension - uncontrolled -increase Coreg multiple 12.5 twice a day.  3. Lipid profile - none in our records, the patient has a blood draw scheduled at his PCP office on Monday, he doesn't wish to have it done here. Continue Pravastatin, followed by PCP  4. Erectile dysfunction - most probably multifuctorial - vascular etiology and s/o radiation therapy to the prostate, he was advised to stop smoking, we will prescribe Viagra 50 mg PRN, advised not to use with sl NTG.  5. Claudications -  arterial Duplex shows B/L PAD, vascular consult is recommended, however he refuses, he states that it got better since he restarted ASA and he can live with it.  6. Smoking  cessation - cutting down  Follow up in 3 months.  Signed, Dorothy Spark MD

## 2014-08-03 ENCOUNTER — Other Ambulatory Visit: Payer: Self-pay | Admitting: Cardiology

## 2014-10-11 ENCOUNTER — Ambulatory Visit (INDEPENDENT_AMBULATORY_CARE_PROVIDER_SITE_OTHER): Payer: 59 | Admitting: Cardiology

## 2014-10-11 ENCOUNTER — Encounter: Payer: Self-pay | Admitting: Cardiology

## 2014-10-11 VITALS — BP 150/70 | HR 87 | Ht 74.0 in | Wt 211.1 lb

## 2014-10-11 DIAGNOSIS — R9431 Abnormal electrocardiogram [ECG] [EKG]: Secondary | ICD-10-CM

## 2014-10-11 MED ORDER — SILDENAFIL CITRATE 50 MG PO TABS: 50.0000 mg | ORAL_TABLET | Freq: Every day | ORAL | Status: DC | PRN

## 2014-10-11 MED ORDER — CARVEDILOL 25 MG PO TABS: 25.0000 mg | ORAL_TABLET | Freq: Two times a day (BID) | ORAL | Status: DC

## 2014-10-11 NOTE — Patient Instructions (Signed)
Your physician has recommended you make the following change in your medication:   START TAKING CARVEDILOL 25 MG TWICE DAILY    WE REFILLED YOUR VIAGRA AS REQUESTED    Your physician wants you to follow-up in: [place] will receive a reminder letter in the mail two months in advance. If you don't receive a letter, please call our office to schedule the follow-up appointment.

## 2014-10-11 NOTE — Progress Notes (Signed)
Patient ID: Clayton [name], male   DOB: [birth year], 63 y.o.   MRN: [id number]    Patient Name: Clayton [name] Date of Encounter: 10/11/2014  Myocardial infarction  HTN (hypertension)  Diabetes mellitus   PVD (peripheral vascular disease)  Hyperlipidemia   SUBJECTIVE  Clayton [name] is coming for follow up. He used to be followed by Dr Lia Foyer, last seen in April 2013 in for followup.   Clayton [name] is a 64 year old male with h/o hypertension, hyperlipidemia, ongoing smoking with h/o CAD, s/p PCI in 2009. The patient has been asymptomatic since then. He is not using aspirin due to allergy. Denies any CP or SOB. He is complaining of claudications with brisk walk, after walking about quarter mile. He also complains of erectile dysfunction that is not improved with use of Viagra. He underwent radiation therapy for prostate cancer a year ago, currently in remission.  The patient is coming after 3 months, he denies CP or SOB. He continues to smoke and has no desire to stop. He states that since he restarted aspirin his claudications are better. He can walk from the car to church with mild claudications that resolve after short break.  07/13/2014 - the patient states that since he started to use aspirin his claudications have significantly improved and don't impair his quality of life right now. He continues to smoke but states he is trying to quit he states that nebivolol really worked for him but he can't afford it. He complains of erectile dysfunction. He denies any chest pain or shortness of breath on moderate activity.   10/11/2014 - the patient is unchanged, no chest pain or SOB, continues to smoke. Viagra seems to be working for his ED. Compliant with his meds. Doesn't check his BP at home. No claudications. No LE edema, orthopnea or PND.   CURRENT MEDS Carvedilol 6.25 MG tablet, Take 6.25 mg by mouth 2 (two) times daily  ferrous sulfate 325 (65 FE) MG tablet,  fish oil-omega-3 fatty acids 1000 MG  capsule,  glipiZIDE (GLUCOTROL) 10 MG tablet, Take 10 mg by mouth BID times 48H., Disp: , Rfl:  glipiZIDE-metformin (METAGLIP) 5-500 MG per tablet,  nitroGLYCERIN (NITROSTAT) 0.4 MG SL tablet,  omeprazole (PRILOSEC) 20 MG capsule, PRAVASTATIN SODIUM PO, Take 20 mg by mouth daily., Disp: , Rfl: ;  quinapril (ACCUPRIL) 20 MG tablet, Take 20 mg by mouth daily.,   OBJECTIVE BP 144/82, HR 77  PHYSICAL EXAM  General: Pleasant, NAD. Neuro: Alert and oriented X 3. Moves all extremities spontaneously. Psych: Normal affect. HEENT:  Normal  Neck: Supple without bruits or JVD. Lungs:  Resp regular and unlabored, CTA. Heart: RRR no s3, s4, or murmurs. Abdomen: Soft, non-tender, non-distended, BS + x 4.  Extremities: No clubbing, cyanosis or edema. Radials 2+ and equal, DP, DT weak bilaterally.  ECG  SR, 82 BPM, RBBB, unchanged from 08/09/2013  ASSESSMENT AND PLAN  64 year old male   1. CAD, PCIs in 2009, asymptomatic, continue carvedilol, quinalapril and pravastatin, restarted aspirin 81 mg po daily  2. Hypertension - uncontrolled -increase Coreg to 25 mg PO twice a day.  3. Lipid profile - none in our records, the patient has a blood draw scheduled at his PCP office on Monday, he doesn't wish to have it done here. Continue Pravastatin, followed by PCP  4. Erectile dysfunction - most probably multifuctorial - vascular etiology and s/o radiation therapy to the prostate, he was advised to stop smoking, we will prescribe Viagra 50 mg  PRN, advised not to use with sl NTG.  5. Claudications -  arterial Duplex shows B/L PAD, vascular consult is recommended, however he refuses, he states that it got better since he restarted ASA and he can live with it.  6. Smoking cessation - cutting down  Follow up in 6 months.  Signed, Dorothy Spark MD

## 2014-10-30 ENCOUNTER — Other Ambulatory Visit: Payer: Self-pay | Admitting: Cardiology

## 2014-12-06 ENCOUNTER — Other Ambulatory Visit: Payer: Self-pay | Admitting: Urology

## 2014-12-06 DIAGNOSIS — D49519 Neoplasm of unspecified behavior of unspecified kidney: Secondary | ICD-10-CM

## 2014-12-16 ENCOUNTER — Ambulatory Visit (HOSPITAL_COMMUNITY)
Admission: RE | Admit: 2014-12-16 | Discharge: 2014-12-16 | Disposition: A | Discharge status: Home / Self Care | Payer: 59 | Source: Ambulatory Visit | Attending: Urology | Admitting: Urology

## 2014-12-16 DIAGNOSIS — D49519 Neoplasm of unspecified behavior of unspecified kidney: Secondary | ICD-10-CM

## 2014-12-22 ENCOUNTER — Ambulatory Visit (HOSPITAL_COMMUNITY)
Admission: RE | Admit: 2014-12-22 | Discharge: 2014-12-22 | Disposition: A | Discharge status: Home / Self Care | Payer: 59 | Source: Ambulatory Visit | Attending: Urology | Admitting: Urology

## 2014-12-22 DIAGNOSIS — Z8546 Personal history of malignant neoplasm of prostate: Secondary | ICD-10-CM | POA: Diagnosis present

## 2014-12-22 MED ORDER — GADOBENATE DIMEGLUMINE 529 MG/ML IV SOLN: 20.0000 mL | Freq: Once | INTRAVENOUS | Status: AC | PRN

## 2015-05-26 ENCOUNTER — Ambulatory Visit (INDEPENDENT_AMBULATORY_CARE_PROVIDER_SITE_OTHER): Payer: 59 | Admitting: Cardiology

## 2015-05-26 ENCOUNTER — Encounter: Payer: Self-pay | Admitting: Cardiology

## 2015-05-26 VITALS — BP 106/55 | HR 84 | Ht 74.0 in | Wt 214.0 lb

## 2015-05-26 DIAGNOSIS — I10 Essential (primary) hypertension: Secondary | ICD-10-CM | POA: Diagnosis not present

## 2015-05-26 DIAGNOSIS — R0602 Shortness of breath: Secondary | ICD-10-CM

## 2015-05-26 NOTE — Patient Instructions (Signed)
Your physician recommends that you schedule a follow-up appointment in: 1 MONTH WITH DR BRANCH  Your physician recommends that you continue on your current medications as directed. Please refer to the Current Medication list given to you today.  Your physician has requested that you have an echocardiogram. Echocardiography is a painless test that uses sound waves to create images of your heart. It provides your doctor with information about the size and shape of your heart and how well your heart's chambers and valves are working. This procedure takes approximately one hour. There are no restrictions for this procedure.  Thank you for choosing [facility]!!    

## 2015-05-26 NOTE — Progress Notes (Signed)
Patient ID: Clayton [name], male   DOB: 1950/10/13, 64 y.o.   MRN: [id number]     Clinical Summary Mr. Hassing is a 64 y.o.male last seen by Dr Meda Coffee, this is our first visit together. She is seen for the following medical problems.   1. CAD - hx of PCI in 2009. 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 LVEF at that time by LVgram 55%.   - denies any chest pain. Notes some SOB with activities. Started about 1 month. DOE walking from parking lot into work. He used to be able to make this walk comfortably. No palpitations. Notes some LE edema. No orthopnea - compliant with meds.    2. HTN - does not check at home - compliant with meds  3. Hyperlipidemia - has been pravastatin for some time. Does not believe he has been on any other statins   4. Leg pains - notes some leg pains with long distances, up to 1 mile.       Past Medical History  Diagnosis Date  . HTN (hypertension)   . Diabetes mellitus   . PVD (peripheral vascular disease)   . Hyperlipidemia   . Myocardial infarction 2009  . Prostate cancer      No Known Allergies   Current Outpatient Prescriptions  Medication Sig Dispense Refill  . carvedilol (COREG) 25 MG tablet Take 1 tablet (25 mg total) by mouth 2 (two) times daily with a meal. 90 tablet 6  . ferrous sulfate 325 (65 FE) MG tablet Take 325 mg by mouth daily with breakfast.    . fish oil-omega-3 fatty acids 1000 MG capsule Take 2 g by mouth daily.    Marland Kitchen glipiZIDE (GLUCOTROL) 10 MG tablet Take 10 mg by mouth 2 (two) times daily before a meal.     . metFORMIN (GLUCOPHAGE) 500 MG tablet Take 500 mg by mouth Twice daily.     Marland Kitchen omeprazole (PRILOSEC) 20 MG capsule Take 20 mg by mouth Daily.    . pravastatin (PRAVACHOL) 20 MG tablet Take 20 mg by mouth daily.    . quinapril (ACCUPRIL) 40 MG tablet TAKE 1 TABLET DAILY 90 tablet 0  . Saw Palmetto 1000 MG CAPS Take 1,000 mg by mouth daily.    . sildenafil (VIAGRA) 50 MG  tablet Take 1 tablet (50 mg total) by mouth daily as needed for erectile dysfunction. 10 tablet 6   No current facility-administered medications for this visit.     Past Surgical History  Procedure Laterality Date  . Cardiac stents  2009  . Cataract extraction w/phaco Left 08/19/2013    Procedure: CATARACT EXTRACTION PHACO AND INTRAOCULAR LENS PLACEMENT (IOC);  Surgeon: Tonny Shariya Gaster, MD;  Location: [facility];  Service: Ophthalmology;  Laterality: Left;  CDE:37.77     No Known Allergies    Family History  Problem Relation Age of Onset  . Coronary artery disease Mother   . Coronary artery disease Father   . Prostate cancer Father      Social History Mr. Fukuda reports that he has been smoking.  He does not have any smokeless tobacco history on file. Mr. Weil reports that he drinks alcohol.   Review of Systems CONSTITUTIONAL: No weight loss, fever, chills, weakness or fatigue.  HEENT: Eyes: No visual loss, blurred vision, double vision or yellow sclerae.No hearing loss, sneezing, congestion, runny nose or sore throat.  SKIN: No rash or itching.  CARDIOVASCULAR: per HPI RESPIRATORY: No shortness  of breath, cough or sputum.  GASTROINTESTINAL: No anorexia, nausea, vomiting or diarrhea. No abdominal pain or blood.  GENITOURINARY: No burning on urination, no polyuria NEUROLOGICAL: No headache, dizziness, syncope, paralysis, ataxia, numbness or tingling in the extremities. No change in bowel or bladder control.  MUSCULOSKELETAL: leg pains LYMPHATICS: No enlarged nodes. No history of splenectomy.  PSYCHIATRIC: No history of depression or anxiety.  ENDOCRINOLOGIC: No reports of sweating, cold or heat intolerance. No polyuria or polydipsia.  Marland Kitchen   Physical Examination Filed Vitals:   05/26/15 0807  BP: 106/55  Pulse: 84   Filed Vitals:   05/26/15 0807  Height: 6\' 2"  (1.88 m)  Weight: 214 lb (97.07 kg)    Gen: resting comfortably, no acute distress HEENT: no scleral icterus,  pupils equal round and reactive, no palptable cervical adenopathy,  CV: RRR, no m/r/g, no jvd Resp: Clear to auscultation bilaterally GI: abdomen is soft, non-tender, non-distended, normal bowel sounds, no hepatosplenomegaly MSK: extremities are warm, no edema.  Skin: warm, no rash Neuro:  no focal deficits Psych: appropriate affect   Diagnostic Studies 11/2007 Cath HEMODYNAMIC RESULTS: Aorta 95/83 mmHg. Left ventricle 94/22 mmHg.  ANGIOGRAPHIC FINDINGS: 1. Left main coronary artery is free of significant flow-limiting  coronary atherosclerosis and gives rise to the left anterior  descending and circumflex vessels. 2. Left anterior descending is a medium-caliber vessel that extends  down to the apex. There are 3 diagonal branches. Within the  proximal portion of the vessel distal to the first diagonal Alithia Zavaleta  is an area of 40% to 50% stenosis followed by an area of 30%  stenosis in the midvessel. In the distal vessel there is an area  of 30% diffuse stenosis. Flow was TIMI-3 in this vessel. 3. The circumflex vessel is medium in caliber. There are 4 obtuse  marginal branches. The first Shemicka Cohrs is the largest. Within this  Latravia Southgate is an area of relatively focal 60% to 70% stenosis  surrounded by an area of approximately 50% to 60% stenosis in  diffuse fashion. Distal to this is an area of 50% more focal  stenosis. 4. Otherwise, there are relatively mild luminal irregularities to  approximately 20% in the circumflex vessel and an area of 30%  stenosis within the fourth obtuse marginal Lidie Glade. 5. Right coronary artery is occluded in the proximal to midvessel  segment. There are faint left-to-right collaterals that fill a  portion of the distal vessel, although the remainder of the vessel  is not well seen.  LEFT VENTRICULOGRAPHY: Performed in the RAO projection and reveals an ejection fraction of  approximately 55% with mid to basal inferior akinesis and trace mitral regurgitation.  DIAGNOSES: 1. Coronary atherosclerosis as outlined including an occluded proximal  to mid right coronary artery associated with faint left-to-right  collateral filling of the distal vessel. There is otherwise  moderate left system disease including 60% to 70% stenosis  involving the obtuse marginal and 40% to 50% stenosis in the  proximal left anterior descending. 2. Left ventricular ejection fraction of approximately 55% with mid to  basal inferior akinesis, trace mitral regurgitation and left  ventricular end-diastolic pressure of 22 mmHg.  DISCUSSION: I reviewed the results with the patient and also discussed the films with Dr. Lia Foyer. At this point, I anticipate percutaneous intervention to treat the right coronary artery and otherwise medical therapy.      Assessment and Plan  1. CAD - notes some increased DOE over the last month. Occasional LE edema.  - will  obtain echo. Pending results would consider stress test  2. HTN - at goal, continue current meds  3. Hyperlipidemia - discussed high dose statin in setting of known CAD. He is not interested in changing, will continue pravastatin.   4. Leg pains - claudication like pain with long distances, no foot sores - continue to manage medically, overall mild symptoms.    /u 1 month   Arnoldo Lenis, M.D.

## 2015-05-31 ENCOUNTER — Other Ambulatory Visit: Payer: Self-pay

## 2015-05-31 ENCOUNTER — Ambulatory Visit (INDEPENDENT_AMBULATORY_CARE_PROVIDER_SITE_OTHER): Payer: 59

## 2015-05-31 DIAGNOSIS — R0602 Shortness of breath: Secondary | ICD-10-CM

## 2015-06-02 ENCOUNTER — Telehealth: Payer: Self-pay | Admitting: *Deleted

## 2015-06-02 NOTE — Telephone Encounter (Signed)
-----   Message from Arnoldo Lenis, MD sent at 06/01/2015  9:42 AM EDT ----- Echo overall looks good. We will reevaluate his symptoms in October when I see him and see if any further testing is needed  Zandra Abts MD

## 2015-06-02 NOTE — Telephone Encounter (Signed)
Pt aware, confirmed 10/7 appt, routed to pcp

## 2015-06-23 ENCOUNTER — Encounter: Payer: Self-pay | Admitting: Cardiology

## 2015-06-23 ENCOUNTER — Ambulatory Visit (INDEPENDENT_AMBULATORY_CARE_PROVIDER_SITE_OTHER): Payer: 59 | Admitting: Cardiology

## 2015-06-23 VITALS — BP 163/89 | HR 77 | Ht 74.0 in | Wt 217.0 lb

## 2015-06-23 DIAGNOSIS — R0602 Shortness of breath: Secondary | ICD-10-CM

## 2015-06-23 DIAGNOSIS — I2510 Atherosclerotic heart disease of native coronary artery without angina pectoris: Secondary | ICD-10-CM

## 2015-06-23 MED ORDER — FUROSEMIDE 20 MG PO TABS: 40.0000 mg | ORAL_TABLET | Freq: Every day | ORAL | Status: DC

## 2015-06-23 NOTE — Patient Instructions (Signed)
Your physician recommends that you schedule a follow-up appointment in: 3 months with Dr. Harl Bowie  Your physician recommends that you continue on your current medications as directed. Please refer to the Current Medication list given to you today.  PLEASE CALL BACK AND VERIFY IF YOU HAVE LASIX AT HOME WITH YOUR MEDICATIONS.  Thank you for choosing [facility]!!

## 2015-06-23 NOTE — Progress Notes (Signed)
Patient ID: Clayton [name], male   DOB: Sep 12, 1951, 64 y.o.   MRN: [id number]     Clinical Summary Clayton [name] is a 64 y.o.male seen today for follow up of the following medical problems.  1. CAD - hx of PCI in 2009. 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 LVEF at that time by LVgram 55%.  - last visit reported some DOE. No chest pain  - since last visit completed echo that showed LVEF 55%. Reports symptoms are improved since last visit.     2. Tobacco history - tobacco x 40 years - notes some coughing and sob at times.  - has never been tested for COPD, currently not interested in PFTs   Past Medical History  Diagnosis Date  . HTN (hypertension)   . Diabetes mellitus   . PVD (peripheral vascular disease)   . Hyperlipidemia   . Myocardial infarction 2009  . Prostate cancer      No Known Allergies   Current Outpatient Prescriptions  Medication Sig Dispense Refill  . aspirin 81 MG tablet Take 81 mg by mouth daily.    . carvedilol (COREG) 25 MG tablet Take 1 tablet (25 mg total) by mouth 2 (two) times daily with a meal. 90 tablet 6  . ferrous sulfate 325 (65 FE) MG tablet Take 325 mg by mouth daily with breakfast.    . fish oil-omega-3 fatty acids 1000 MG capsule Take 2 g by mouth daily.    . furosemide (LASIX) 20 MG tablet Take 2 tablets by mouth daily.    Marland Kitchen glipiZIDE (GLUCOTROL XL) 5 MG 24 hr tablet Take 1 tablet by mouth 2 (two) times daily.    . metFORMIN (GLUCOPHAGE) 1000 MG tablet Take 1 tablet by mouth 2 (two) times daily.    Marland Kitchen omeprazole (PRILOSEC) 20 MG capsule Take 20 mg by mouth Daily.    . pravastatin (PRAVACHOL) 40 MG tablet Take 1 tablet by mouth daily.    . Saw Palmetto 1000 MG CAPS Take 1,000 mg by mouth daily.    . sildenafil (VIAGRA) 50 MG tablet Take 1 tablet (50 mg total) by mouth daily as needed for erectile dysfunction. 10 tablet 6   No current facility-administered medications for this visit.     Past  Surgical History  Procedure Laterality Date  . Cardiac stents  2009  . Cataract extraction w/phaco Left 08/19/2013    Procedure: CATARACT EXTRACTION PHACO AND INTRAOCULAR LENS PLACEMENT (IOC);  Surgeon: Tonny Taylynn Easton, MD;  Location: [facility];  Service: Ophthalmology;  Laterality: Left;  CDE:37.77     No Known Allergies    Family History  Problem Relation Age of Onset  . Coronary artery disease Mother   . Coronary artery disease Father   . Prostate cancer Father      Social History Mr. Westling reports that he has been smoking Cigarettes.  He started smoking about 45 years ago. He has a 15 pack-year smoking history. He has never used smokeless tobacco. Mr. Radabaugh reports that he drinks alcohol.   Review of Systems CONSTITUTIONAL: No weight loss, fever, chills, weakness or fatigue.  HEENT: Eyes: No visual loss, blurred vision, double vision or yellow sclerae.No hearing loss, sneezing, congestion, runny nose or sore throat.  SKIN: No rash or itching.  CARDIOVASCULAR: per hpi RESPIRATORY: per hpi  GASTROINTESTINAL: No anorexia, nausea, vomiting or diarrhea. No abdominal pain or blood.  GENITOURINARY: No burning on urination, no polyuria NEUROLOGICAL: No headache,  dizziness, syncope, paralysis, ataxia, numbness or tingling in the extremities. No change in bowel or bladder control.  MUSCULOSKELETAL: No muscle, back pain, joint pain or stiffness.  LYMPHATICS: No enlarged nodes. No history of splenectomy.  PSYCHIATRIC: No history of depression or anxiety.  ENDOCRINOLOGIC: No reports of sweating, cold or heat intolerance. No polyuria or polydipsia.  Marland Kitchen   Physical Examination Filed Vitals:   06/23/15 0833  BP: 163/89  Pulse: 77   Filed Vitals:   06/23/15 0833  Height: 6\' 2"  (1.88 m)  Weight: 217 lb (98.431 kg)    Gen: resting comfortably, no acute distress HEENT: no scleral icterus, pupils equal round and reactive, no palptable cervical adenopathy,  CV: RRR, no m/r/g, no jvd Resp:  Clear to auscultation bilaterally GI: abdomen is soft, non-tender, non-distended, normal bowel sounds, no hepatosplenomegaly MSK: extremities are warm, no edema.  Skin: warm, no rash Neuro:  no focal deficits Psych: appropriate affect   Diagnostic Studies 11/2007 Cath HEMODYNAMIC RESULTS: Aorta 95/83 mmHg. Left ventricle 94/22 mmHg.  ANGIOGRAPHIC FINDINGS: 1. Left main coronary artery is free of significant flow-limiting  coronary atherosclerosis and gives rise to the left anterior  descending and circumflex vessels. 2. Left anterior descending is a medium-caliber vessel that extends  down to the apex. There are 3 diagonal branches. Within the  proximal portion of the vessel distal to the first diagonal Tondra Reierson  is an area of 40% to 50% stenosis followed by an area of 30%  stenosis in the midvessel. In the distal vessel there is an area  of 30% diffuse stenosis. Flow was TIMI-3 in this vessel. 3. The circumflex vessel is medium in caliber. There are 4 obtuse  marginal branches. The first Shakeena Kafer is the largest. Within this  Sholonda Jobst is an area of relatively focal 60% to 70% stenosis  surrounded by an area of approximately 50% to 60% stenosis in  diffuse fashion. Distal to this is an area of 50% more focal  stenosis. 4. Otherwise, there are relatively mild luminal irregularities to  approximately 20% in the circumflex vessel and an area of 30%  stenosis within the fourth obtuse marginal Nigil Braman. 5. Right coronary artery is occluded in the proximal to midvessel  segment. There are faint left-to-right collaterals that fill a  portion of the distal vessel, although the remainder of the vessel  is not well seen.  LEFT VENTRICULOGRAPHY: Performed in the RAO projection and reveals an ejection fraction of approximately 55% with mid to basal inferior akinesis and trace mitral  regurgitation.  DIAGNOSES: 1. Coronary atherosclerosis as outlined including an occluded proximal  to mid right coronary artery associated with faint left-to-right  collateral filling of the distal vessel. There is otherwise  moderate left system disease including 60% to 70% stenosis  involving the obtuse marginal and 40% to 50% stenosis in the  proximal left anterior descending. 2. Left ventricular ejection fraction of approximately 55% with mid to  basal inferior akinesis, trace mitral regurgitation and left  ventricular end-diastolic pressure of 22 mmHg.  DISCUSSION: I reviewed the results with the patient and also discussed the films with Dr. Lia Foyer. At this point, I anticipate percutaneous intervention to treat the right coronary artery and otherwise medical therapy.   05/2015 echo  Study Conclusions  - Left ventricle: Technically dificiult study. 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 The cavity size was mildly dilated. Wall thickness was increased in a pattern of mild LVH. The estimated ejection fraction was 55%. - Aortic valve: Sclerosis without  stenosis. There was mild regurgitation. - Right ventricle: The cavity size was normal. Systolic function was normal.   Assessment and Plan  1. CAD - notes some increased DOE over the last month. No significant abormalities on recent echo. Long history of tobacco abuse with chronic cough, suspect possible COPD. He is not interested in PFTs. - continue current meds  F/u 3 months       Arnoldo Lenis, M.D.

## 2015-06-27 ENCOUNTER — Inpatient Hospital Stay (HOSPITAL_COMMUNITY)
Admission: EM | Admit: 2015-06-27 | Discharge: 2015-06-29 | DRG: 189 | Disposition: A | Discharge status: Home / Self Care | Payer: 59 | Attending: Family Medicine | Admitting: Family Medicine

## 2015-06-27 ENCOUNTER — Encounter (HOSPITAL_COMMUNITY): Payer: Self-pay | Admitting: Emergency Medicine

## 2015-06-27 ENCOUNTER — Emergency Department (HOSPITAL_COMMUNITY): Payer: 59

## 2015-06-27 DIAGNOSIS — J441 Chronic obstructive pulmonary disease with (acute) exacerbation: Secondary | ICD-10-CM | POA: Diagnosis not present

## 2015-06-27 DIAGNOSIS — R609 Edema, unspecified: Secondary | ICD-10-CM

## 2015-06-27 DIAGNOSIS — Z8546 Personal history of malignant neoplasm of prostate: Secondary | ICD-10-CM

## 2015-06-27 DIAGNOSIS — E785 Hyperlipidemia, unspecified: Secondary | ICD-10-CM | POA: Diagnosis present

## 2015-06-27 DIAGNOSIS — Z955 Presence of coronary angioplasty implant and graft: Secondary | ICD-10-CM

## 2015-06-27 DIAGNOSIS — Z8249 Family history of ischemic heart disease and other diseases of the circulatory system: Secondary | ICD-10-CM

## 2015-06-27 DIAGNOSIS — Z8042 Family history of malignant neoplasm of prostate: Secondary | ICD-10-CM

## 2015-06-27 DIAGNOSIS — F17210 Nicotine dependence, cigarettes, uncomplicated: Secondary | ICD-10-CM | POA: Diagnosis present

## 2015-06-27 DIAGNOSIS — E119 Type 2 diabetes mellitus without complications: Secondary | ICD-10-CM | POA: Diagnosis not present

## 2015-06-27 DIAGNOSIS — R0602 Shortness of breath: Secondary | ICD-10-CM | POA: Diagnosis not present

## 2015-06-27 DIAGNOSIS — Z7982 Long term (current) use of aspirin: Secondary | ICD-10-CM | POA: Diagnosis not present

## 2015-06-27 DIAGNOSIS — J9601 Acute respiratory failure with hypoxia: Secondary | ICD-10-CM | POA: Diagnosis not present

## 2015-06-27 DIAGNOSIS — I2510 Atherosclerotic heart disease of native coronary artery without angina pectoris: Secondary | ICD-10-CM | POA: Diagnosis present

## 2015-06-27 DIAGNOSIS — F17200 Nicotine dependence, unspecified, uncomplicated: Secondary | ICD-10-CM | POA: Diagnosis not present

## 2015-06-27 DIAGNOSIS — I10 Essential (primary) hypertension: Secondary | ICD-10-CM | POA: Diagnosis present

## 2015-06-27 DIAGNOSIS — Z7984 Long term (current) use of oral hypoglycemic drugs: Secondary | ICD-10-CM | POA: Diagnosis not present

## 2015-06-27 DIAGNOSIS — J9690 Respiratory failure, unspecified, unspecified whether with hypoxia or hypercapnia: Secondary | ICD-10-CM

## 2015-06-27 DIAGNOSIS — I252 Old myocardial infarction: Secondary | ICD-10-CM | POA: Diagnosis not present

## 2015-06-27 LAB — CBC WITH DIFFERENTIAL/PLATELET
BASOS ABS: 0 10*3/uL (ref 0.0–0.1)
Basophils Relative: 0 %
Eosinophils Absolute: 0.1 10*3/uL (ref 0.0–0.7)
Eosinophils Relative: 1 %
HEMATOCRIT: 44.7 % (ref 39.0–52.0)
Hemoglobin: 14.6 g/dL (ref 13.0–17.0)
LYMPHS PCT: 15 %
Lymphs Abs: 1.1 10*3/uL (ref 0.7–4.0)
MCH: 32.2 pg (ref 26.0–34.0)
MCHC: 32.7 g/dL (ref 30.0–36.0)
MCV: 98.7 fL (ref 78.0–100.0)
Monocytes Absolute: 0.8 10*3/uL (ref 0.1–1.0)
Monocytes Relative: 11 %
NEUTROS ABS: 5.3 10*3/uL (ref 1.7–7.7)
Neutrophils Relative %: 73 %
PLATELETS: 186 10*3/uL (ref 150–400)
RBC: 4.53 MIL/uL (ref 4.22–5.81)
RDW: 12.6 % (ref 11.5–15.5)
WBC: 7.3 10*3/uL (ref 4.0–10.5)

## 2015-06-27 LAB — COMPREHENSIVE METABOLIC PANEL
ALBUMIN: 3.8 g/dL (ref 3.5–5.0)
ALT: 28 U/L (ref 17–63)
ANION GAP: 4 — AB (ref 5–15)
AST: 27 U/L (ref 15–41)
Alkaline Phosphatase: 131 U/L — ABNORMAL HIGH (ref 38–126)
BILIRUBIN TOTAL: 1.2 mg/dL (ref 0.3–1.2)
BUN: 12 mg/dL (ref 6–20)
CALCIUM: 8.8 mg/dL — AB (ref 8.9–10.3)
CO2: 30 mmol/L (ref 22–32)
Chloride: 107 mmol/L (ref 101–111)
Creatinine, Ser: 0.66 mg/dL (ref 0.61–1.24)
GFR calc non Af Amer: >60 mL/min (ref >60)
Glucose, Bld: 195 mg/dL — ABNORMAL HIGH (ref 65–99)
POTASSIUM: 4.3 mmol/L (ref 3.5–5.1)
Sodium: 141 mmol/L (ref 135–145)
TOTAL PROTEIN: 7.4 g/dL (ref 6.5–8.1)

## 2015-06-27 LAB — SEDIMENTATION RATE: Sed Rate: 5 mm/hr (ref 0–16)

## 2015-06-27 LAB — BRAIN NATRIURETIC PEPTIDE: B Natriuretic Peptide: 436 pg/mL — ABNORMAL HIGH (ref 0.0–100.0)

## 2015-06-27 LAB — D-DIMER, QUANTITATIVE (NOT AT ARMC): D DIMER QUANT: 0.7 ug{FEU}/mL — AB (ref 0.00–0.48)

## 2015-06-27 LAB — I-STAT TROPONIN, ED: TROPONIN I, POC: 0 ng/mL (ref 0.00–0.08)

## 2015-06-27 MED ORDER — MOMETASONE FURO-FORMOTEROL FUM 100-5 MCG/ACT IN AERO
2.0000 | INHALATION_SPRAY | Freq: Two times a day (BID) | RESPIRATORY_TRACT | Status: DC
  Administered 2015-06-28 – 2015-06-29 (×3): 2 via RESPIRATORY_TRACT
  Filled 2015-06-27: qty 8.8

## 2015-06-27 MED ORDER — ENOXAPARIN SODIUM 40 MG/0.4ML ~~LOC~~ SOLN
40.0000 mg | SUBCUTANEOUS | Status: DC
  Administered 2015-06-27 – 2015-06-28 (×2): 40 mg via SUBCUTANEOUS
  Filled 2015-06-27 (×2): qty 0.4

## 2015-06-27 MED ORDER — CETYLPYRIDINIUM CHLORIDE 0.05 % MT LIQD
7.0000 mL | Freq: Two times a day (BID) | OROMUCOSAL | Status: DC
  Administered 2015-06-28 – 2015-06-29 (×3): 7 mL via OROMUCOSAL

## 2015-06-27 MED ORDER — PANTOPRAZOLE SODIUM 40 MG PO TBEC
40.0000 mg | DELAYED_RELEASE_TABLET | Freq: Every day | ORAL | Status: DC
  Administered 2015-06-28 – 2015-06-29 (×2): 40 mg via ORAL
  Filled 2015-06-27 (×2): qty 1

## 2015-06-27 MED ORDER — FUROSEMIDE 10 MG/ML IJ SOLN
20.0000 mg | Freq: Two times a day (BID) | INTRAMUSCULAR | Status: DC
Start: 2015-06-28 — End: 2015-06-28
  Administered 2015-06-28: 20 mg via INTRAVENOUS
  Filled 2015-06-27: qty 2

## 2015-06-27 MED ORDER — ACETAMINOPHEN 650 MG RE SUPP: 650.0000 mg | Freq: Four times a day (QID) | RECTAL | Status: DC | PRN

## 2015-06-27 MED ORDER — ONDANSETRON HCL 4 MG/2ML IJ SOLN: 4.0000 mg | Freq: Four times a day (QID) | INTRAMUSCULAR | Status: DC | PRN

## 2015-06-27 MED ORDER — ACETAMINOPHEN 325 MG PO TABS: 650.0000 mg | ORAL_TABLET | Freq: Four times a day (QID) | ORAL | Status: DC | PRN

## 2015-06-27 MED ORDER — SENNOSIDES-DOCUSATE SODIUM 8.6-50 MG PO TABS: 1.0000 | ORAL_TABLET | Freq: Every evening | ORAL | Status: DC | PRN

## 2015-06-27 MED ORDER — METFORMIN HCL 500 MG PO TABS
1000.0000 mg | ORAL_TABLET | Freq: Two times a day (BID) | ORAL | Status: DC
  Administered 2015-06-28: 1000 mg via ORAL
  Filled 2015-06-27: qty 2

## 2015-06-27 MED ORDER — NICOTINE 21 MG/24HR TD PT24
21.0000 mg | MEDICATED_PATCH | Freq: Every day | TRANSDERMAL | Status: DC
  Administered 2015-06-27 – 2015-06-29 (×3): 21 mg via TRANSDERMAL
  Filled 2015-06-27 (×3): qty 1

## 2015-06-27 MED ORDER — PRAVASTATIN SODIUM 40 MG PO TABS
40.0000 mg | ORAL_TABLET | Freq: Every day | ORAL | Status: DC
  Administered 2015-06-28 – 2015-06-29 (×2): 40 mg via ORAL
  Filled 2015-06-27 (×2): qty 1

## 2015-06-27 MED ORDER — ALBUTEROL SULFATE (2.5 MG/3ML) 0.083% IN NEBU: 2.5000 mg | INHALATION_SOLUTION | RESPIRATORY_TRACT | Status: DC

## 2015-06-27 MED ORDER — ASPIRIN 81 MG PO CHEW
81.0000 mg | CHEWABLE_TABLET | Freq: Every day | ORAL | Status: DC
  Administered 2015-06-28 – 2015-06-29 (×2): 81 mg via ORAL
  Filled 2015-06-27 (×4): qty 1

## 2015-06-27 MED ORDER — ALBUTEROL SULFATE (2.5 MG/3ML) 0.083% IN NEBU: 2.5000 mg | INHALATION_SOLUTION | RESPIRATORY_TRACT | Status: DC | PRN

## 2015-06-27 MED ORDER — CARVEDILOL 12.5 MG PO TABS
25.0000 mg | ORAL_TABLET | Freq: Two times a day (BID) | ORAL | Status: DC
  Administered 2015-06-28 – 2015-06-29 (×3): 25 mg via ORAL
  Filled 2015-06-27 (×3): qty 2

## 2015-06-27 MED ORDER — METHYLPREDNISOLONE SODIUM SUCC 125 MG IJ SOLR
60.0000 mg | Freq: Two times a day (BID) | INTRAMUSCULAR | Status: DC
  Administered 2015-06-27 – 2015-06-28 (×2): 60 mg via INTRAVENOUS
  Filled 2015-06-27 (×2): qty 2

## 2015-06-27 MED ORDER — ALUM & MAG HYDROXIDE-SIMETH 200-200-20 MG/5ML PO SUSP: 30.0000 mL | Freq: Four times a day (QID) | ORAL | Status: DC | PRN

## 2015-06-27 MED ORDER — GLIPIZIDE ER 5 MG PO TB24
5.0000 mg | ORAL_TABLET | Freq: Two times a day (BID) | ORAL | Status: DC
  Administered 2015-06-28 – 2015-06-29 (×3): 5 mg via ORAL
  Filled 2015-06-27 (×3): qty 1

## 2015-06-27 MED ORDER — ALBUTEROL SULFATE (2.5 MG/3ML) 0.083% IN NEBU
2.5000 mg | INHALATION_SOLUTION | Freq: Four times a day (QID) | RESPIRATORY_TRACT | Status: DC
Start: 2015-06-27 — End: 2015-06-28
  Administered 2015-06-28: 2.5 mg via RESPIRATORY_TRACT
  Filled 2015-06-27: qty 3

## 2015-06-27 MED ORDER — IOHEXOL 350 MG/ML SOLN
100.0000 mL | Freq: Once | INTRAVENOUS | Status: AC | PRN
  Administered 2015-06-27: 100 mL via INTRAVENOUS

## 2015-06-27 MED ORDER — FUROSEMIDE 10 MG/ML IJ SOLN
40.0000 mg | Freq: Once | INTRAMUSCULAR | Status: AC
  Administered 2015-06-27: 40 mg via INTRAVENOUS
  Filled 2015-06-27: qty 4

## 2015-06-27 MED ORDER — ONDANSETRON HCL 4 MG PO TABS: 4.0000 mg | ORAL_TABLET | Freq: Four times a day (QID) | ORAL | Status: DC | PRN

## 2015-06-27 NOTE — ED Notes (Signed)
Report given to Bree RN on 300 

## 2015-06-27 NOTE — ED Notes (Signed)
MD at bedside. 

## 2015-06-27 NOTE — ED Notes (Signed)
Patient sent by Dr Murrell Redden office for low O2 saturation. Patient given albuterol treatment at doctor's office. Patient O2 saturation 84% on room air upon arrival to ED. Placed on 2L O2, increased to 91%. Patient also complaining of bilateral feet swelling and shortness of breath x 1 month.

## 2015-06-27 NOTE — ED Provider Notes (Signed)
CSN: [id number]     Arrival date & time 06/27/15  1557 History   First MD Initiated Contact with Patient 06/27/15 1609     Chief Complaint  Patient presents with  . Shortness of Breath     (Consider location/radiation/quality/duration/timing/severity/associated sxs/prior Treatment) HPI   Patient is a pleasant 64 year old male here with shortness of breath. Patient went back to his primary care's office for follow-up appointment is found to be hypoxic to 84% on room air. Patient has difficulty breathing for the last 2 months. In the beginning of September he developed a pneumonia. Since then he's also been started on Lasix. He was started on 1 pill once a day, and then instructed to increase as he was having continued shortness of breath over the last month. However patient believes that the pill is  causing him to have swollen legs so he been taking intermittently.   Patient has noticed increasing pain in his bilateral lower extremities. And increasing shortness of breath. No fevers, no cough.    Past Medical History  Diagnosis Date  . HTN (hypertension)   . Diabetes mellitus   . PVD (peripheral vascular disease) ([place])   . Hyperlipidemia   . Myocardial infarction ([place]) 2009  . Prostate cancer [facility])    Past Surgical History  Procedure Laterality Date  . Cardiac stents  2009  . Cataract extraction w/phaco Left 08/19/2013    Procedure: CATARACT EXTRACTION PHACO AND INTRAOCULAR LENS PLACEMENT (IOC);  Surgeon: Tonny Branch, MD;  Location: [facility];  Service: Ophthalmology;  Laterality: Left;  CDE:37.77   Family History  Problem Relation Age of Onset  . Coronary artery disease Mother   . Coronary artery disease Father   . Prostate cancer Father    Social History  Substance Use Topics  . Smoking status: Current Every Day Smoker -- 0.75 packs/day for 20 years    Types: Cigarettes    Start date: 07/07/1969  . Smokeless tobacco: Never Used  . Alcohol Use: 0.0 oz/week    0 Standard  drinks or equivalent per week     Comment: occassional    Review of Systems  Constitutional: Negative for fever and activity change.  HENT: Negative for congestion.   Eyes: Negative for discharge and redness.  Respiratory: Positive for shortness of breath. Negative for cough.   Cardiovascular: Positive for leg swelling. Negative for chest pain.  Gastrointestinal: Negative for vomiting, abdominal pain and diarrhea.  Genitourinary: Negative for dysuria and urgency.  Musculoskeletal: Negative for arthralgias.  Skin: Negative for rash.  Allergic/Immunologic: Negative for immunocompromised state.  Neurological: Negative for syncope and speech difficulty.  Psychiatric/Behavioral: Negative for behavioral problems and agitation.  All other systems reviewed and are negative.     Allergies  Review of patient's allergies indicates no known allergies.  Home Medications   Prior to Admission medications   Medication Sig Start Date End Date Taking? Authorizing Provider  aspirin 81 MG tablet Take 81 mg by mouth daily.    Historical Provider, MD  carvedilol (COREG) 25 MG tablet Take 1 tablet (25 mg total) by mouth 2 (two) times daily with a meal. 10/11/14   Dorothy Spark, MD  ferrous sulfate 325 (65 FE) MG tablet Take 325 mg by mouth daily with breakfast.    Historical Provider, MD  fish oil-omega-3 fatty acids 1000 MG capsule Take 2 g by mouth daily.    Historical Provider, MD  furosemide (LASIX) 20 MG tablet Take 2 tablets (40 mg total) by mouth daily.  06/23/15   Arnoldo Lenis, MD  glipiZIDE (GLUCOTROL XL) 5 MG 24 hr tablet Take 1 tablet by mouth 2 (two) times daily. 05/14/15   Historical Provider, MD  metFORMIN (GLUCOPHAGE) 1000 MG tablet Take 1 tablet by mouth 2 (two) times daily. 05/14/15   Historical Provider, MD  omeprazole (PRILOSEC) 20 MG capsule Take 20 mg by mouth Daily. 04/27/12   Historical Provider, MD  pravastatin (PRAVACHOL) 40 MG tablet Take 1 tablet by mouth daily. 05/14/15    Historical Provider, MD  Saw Palmetto 1000 MG CAPS Take 1,000 mg by mouth daily.    Historical Provider, MD  sildenafil (VIAGRA) 50 MG tablet Take 1 tablet (50 mg total) by mouth daily as needed for erectile dysfunction. 10/11/14   Dorothy Spark, MD   BP 162/83 mmHg  Pulse 88  Temp(Src) 98.2 F (36.8 C) (Oral)  Resp 24  Ht 6' (1.829 m)  Wt 210 lb (95.255 kg)  BMI 28.47 kg/m2  SpO2 84% Physical Exam  Constitutional: He is oriented to person, place, and time. He appears well-nourished.  HENT:  Head: Normocephalic.  Mouth/Throat: Oropharynx is clear and moist.  Eyes: Conjunctivae are normal.  Neck: No tracheal deviation present.  Cardiovascular: Normal rate.   Pulmonary/Chest: No stridor. He is in respiratory distress. He has no wheezes.  Increased work of breathing. 86% on room air.  Abdominal: Soft. There is no tenderness. There is no guarding.  Musculoskeletal: Normal range of motion. He exhibits edema.  Edema to the knee bilaterally.  Neurological: He is oriented to person, place, and time. No cranial nerve deficit.  Skin: Skin is warm and dry. No rash noted. He is not diaphoretic.  Psychiatric: He has a normal mood and affect. His behavior is normal.  Nursing note and vitals reviewed.   ED Course  Procedures (including critical care time) Labs Review Labs Reviewed  CBC WITH DIFFERENTIAL/PLATELET  COMPREHENSIVE METABOLIC PANEL  BRAIN NATRIURETIC PEPTIDE  I-STAT [place], ED    Imaging Review No results found. I have personally reviewed and evaluated these images and lab results as part of my medical decision-making.   EKG Interpretation   Date/Time:  Tuesday June 27 2015 16:11:58 EDT Ventricular Rate:  76 PR Interval:  151 QRS Duration: 139 QT Interval:  392 QTC Calculation: 441 R Axis:   104 Text Interpretation:  Sinus rhythm Multiple premature complexes, vent   RBBB and LPFB Left posterior fasicular block No significant change since  last tracing  Confirmed by Gerald Leitz ([id number]) on 06/27/2015 4:45:24  PM      MDM   Final diagnoses:  None   Patient is a pleasant 64 year old male with history of hypertension diabetes and CAD presenting today with increasing shortness of breath and edema. Patient has not been taking his Lasix pill because he believes is causing him to have edema rather than helping it. I believe this is why he having increasing shortness of breath and edema. We will get BNP, troponin, EKG, chest x-ray today to further evaluate his shortness of breath. Anticipate need to admit because of a new oxygen requirement and fluid overload.  6:56 PM Patient's chest x-ray does not show fluid overload. However patient is hypoxic on room air. Given this and his increased work of breathing, will send d-dimer. D-dimer positive, will do CT PE study.     8:25 PM Will admit to hosptil given new O2 requirement.   Polo Mcmartin Julio Alm, MD 06/27/15 2025

## 2015-06-27 NOTE — H&P (Signed)
History and Physical  Tytus Strahle HQR:[id number] DOB: 07/14/1951 DOA: 06/27/2015  Referring physician: Dr Thomasene Lot, ED physician PCP: Sherrie Mustache, MD   Chief Complaint: Shortness of breath  HPI: Alton Bouknight is a 64 y.o. male  With a history of hypertension, diabetes, peripheral vascular disease, hyperlipidemia, history of MI. Patient presents emergency department from his PCPs office due to worsening dyspnea over the past month. Oxygenation saturations in his doctor's office was in the mid 21s. The home has been intermittent, but worse with exertion and improved with rest. Patient admits to two-pillow orthopnea - which is chronic in nature and has not changed.  He also complains of worsening edema and a 10 pound weight gain over the past 2 weeks. The patient saw his primary care physician for this problem the beginning of September, and was diagnosed with pneumonia and was put on antibiotics. He states that his breathing has not improved. He does admit to a cough that is intermittently productive with white phlegm. There is no purulent sputum production.   Review of Systems:   Pt denies any fevers, chills, nausea, vomiting, diarrhea, constipation, abdominal pain,  orthopnea, cough, wheezing, palpitations, headache, vision changes, lightheadedness, dizziness, diarrhea, constipation, melena, rectal bleeding.  Review of systems are otherwise negative  Past Medical History  Diagnosis Date  . HTN (hypertension)   . Diabetes mellitus   . PVD (peripheral vascular disease) ([place])   . Hyperlipidemia   . Myocardial infarction ([place]) 2009  . Prostate cancer [facility])    Past Surgical History  Procedure Laterality Date  . Cardiac stents  2009  . Cataract extraction w/phaco Left 08/19/2013    Procedure: CATARACT EXTRACTION PHACO AND INTRAOCULAR LENS PLACEMENT (IOC);  Surgeon: Tonny Branch, MD;  Location: [facility];  Service: Ophthalmology;  Laterality: Left;  CDE:37.77   Social History:   reports that he has been smoking Cigarettes.  He started smoking about 46 years ago. He has a 15 pack-year smoking history. He has never used smokeless tobacco. He reports that he drinks alcohol. He reports that he does not use illicit drugs. Patient lives at home & is able to participate in activities of daily living  No Known Allergies  Family History  Problem Relation Age of Onset  . Coronary artery disease Mother   . Coronary artery disease Father   . Prostate cancer Father      Prior to Admission medications   Medication Sig Start Date End Date Taking? Authorizing Provider  aspirin 81 MG tablet Take 81 mg by mouth daily.   Yes Historical Provider, MD  carvedilol (COREG) 25 MG tablet Take 1 tablet (25 mg total) by mouth 2 (two) times daily with a meal. 10/11/14  Yes Dorothy Spark, MD  ferrous sulfate 325 (65 FE) MG tablet Take 325 mg by mouth daily with breakfast.   Yes Historical Provider, MD  fish oil-omega-3 fatty acids 1000 MG capsule Take 2 g by mouth daily.   Yes Historical Provider, MD  Fluticasone-Salmeterol (ADVAIR DISKUS) 250-50 MCG/DOSE AEPB Inhale 1 puff into the lungs 2 (two) times daily as needed. 05/25/15  Yes Historical Provider, MD  furosemide (LASIX) 20 MG tablet Take 2 tablets (40 mg total) by mouth daily. 06/23/15  Yes Arnoldo Lenis, MD  glipiZIDE (GLUCOTROL XL) 5 MG 24 hr tablet Take 1 tablet by mouth 2 (two) times daily. 05/14/15  Yes Historical Provider, MD  metFORMIN (GLUCOPHAGE) 1000 MG tablet Take 1 tablet by mouth 2 (two) times daily. 05/14/15  Yes  Historical Provider, MD  omeprazole (PRILOSEC) 20 MG capsule Take 20 mg by mouth Daily. 04/27/12  Yes Historical Provider, MD  pravastatin (PRAVACHOL) 40 MG tablet Take 1 tablet by mouth daily. 05/14/15  Yes Historical Provider, MD  Saw Palmetto 1000 MG CAPS Take 1,000 mg by mouth daily.   Yes Historical Provider, MD  sildenafil (VIAGRA) 50 MG tablet Take 1 tablet (50 mg total) by mouth daily as needed for erectile  dysfunction. 10/11/14  Yes Dorothy Spark, MD    Physical Exam: BP 156/96 mmHg  Pulse 88  Temp(Src) 98.2 F (36.8 C) (Oral)  Resp 24  Ht 6' (1.829 m)  Wt 95.255 kg (210 lb)  BMI 28.47 kg/m2  SpO2 97%  General: Elderly black male. Awake and alert and oriented x3. No acute cardiopulmonary distress.  Eyes: Pupils equal, round, reactive to light. Extraocular muscles are intact. Sclerae anicteric and noninjected.  ENT:  Moist mucosal membranes. No mucosal lesions. Teeth in moderate repair  Neck: Neck supple without lymphadenopathy. No carotid bruits. No masses palpated.  Cardiovascular: Regular rate with normal S1-S2 sounds. No murmurs, rubs, gallops auscultated. No JVD. 2+ edema from the feet to the mid shin bilaterally Respiratory: Good air movement, prolonged expiration phase. There are scattered wheezes, particularly in the bases bilaterally. Absence of rales.  Abdomen: Soft, nontender, nondistended. Active bowel sounds. No masses or hepatosplenomegaly  Skin: Dry, warm to touch. 2+ dorsalis pedis and radial pulses. Musculoskeletal: No calf or leg pain. All major joints not erythematous nontender.  Psychiatric: Intact judgment and insight.  Neurologic: No focal neurological deficits. Cranial nerves II through XII are grossly intact.           Labs on Admission:  Basic Metabolic Panel:  Recent Labs Lab 06/27/15 1640  NA 141  K 4.3  CL 107  CO2 30  GLUCOSE 195*  BUN 12  CREATININE 0.66  CALCIUM 8.8*   Liver Function Tests:  Recent Labs Lab 06/27/15 1640  AST 27  ALT 28  ALKPHOS 131*  BILITOT 1.2  PROT 7.4  ALBUMIN 3.8   No results for input(s): LIPASE, AMYLASE in the last 168 hours. No results for input(s): AMMONIA in the last 168 hours. CBC:  Recent Labs Lab 06/27/15 1640  WBC 7.3  NEUTROABS 5.3  HGB 14.6  HCT 44.7  MCV 98.7  PLT 186   Cardiac Enzymes: No results for input(s): CKTOTAL, CKMB, CKMBINDEX, TROPONINI in the last 168 hours.  BNP (last  3 results)  Recent Labs  06/27/15 1820  BNP 436.0*    ProBNP (last 3 results) No results for input(s): PROBNP in the last 8760 hours.  CBG: No results for input(s): GLUCAP in the last 168 hours.  Radiological Exams on Admission: Dg Chest 2 View  06/27/2015   CLINICAL DATA:  Shortness of breath  EXAM: CHEST  2 VIEW  COMPARISON:  05/24/2015  FINDINGS: There is chronic bilateral interstitial thickening. There is no focal parenchymal opacity. There is no pleural effusion or pneumothorax. The heart and mediastinal contours are unremarkable.  The osseous structures are unremarkable.  IMPRESSION: No active cardiopulmonary disease.   Electronically Signed   By: Kathreen Devoid   On: 06/27/2015 17:29   Ct Angio Chest Pe W/cm &/or Wo Cm  06/27/2015   CLINICAL DATA:  64 year old male with bilateral lower extremity swelling for 1 month. Shortness of breath. Abnormal D-dimer. Initial encounter. 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  EXAM: CT ANGIOGRAPHY CHEST WITH CONTRAST  TECHNIQUE: Multidetector CT imaging of the chest was performed using the standard protocol during bolus administration of intravenous contrast. Multiplanar CT image reconstructions and MIPs were obtained to evaluate the vascular anatomy.  CONTRAST:  163mL OMNIPAQUE IOHEXOL 350 MG/ML SOLN  COMPARISON:  Chest radiographs 1654 hr today. CT Abdomen and Pelvis 12/05/2014  FINDINGS: Adequate contrast bolus timing in the pulmonary arterial tree.  No focal filling defect identified in the pulmonary arterial tree to suggest the presence of acute pulmonary embolism.  No pericardial or pleural effusion. There is bilateral hilar lymphadenopathy. Individual nodes measure up to 14 mm short axis. The extent is slightly greater on the right. Mediastinal nodal stations however are within normal limits. No axillary lymphadenopathy. Negative thoracic inlet.  Calcified Coronary artery atherosclerosis. Negative visualized aorta.  Stable  visualized upper abdominal viscera since March.  Centrilobular emphysema. Mild superimposed pulmonary septal thickening in the apices and periphery of the lungs. Major airways are patent. There is mild perihilar bronchiectasis. No consolidation. No pulmonary nodule.  No acute osseous abnormality identified.  Review of the MIP images confirms the above findings.  IMPRESSION: 1.  No evidence of acute pulmonary embolus. 2. Nonspecific bilateral hilar lymphadenopathy. This might be postinflammatory in light of bilateral pulmonary emphysema and mild bronchiectasis. Sarcoidosis also is a consideration. The absence of mediastinal lymphadenopathy or lung nodule argue against malignant etiology, but given history of left renal neoplasm metastatic disease is not excluded.   Electronically Signed   By: Genevie Ann M.D.   On: 06/27/2015 19:33    EKG: Independently reviewed. Sinus rhythm with a ventricular rate is 76. QRS at prolonged 0.139.  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 No ST changes. There are multiple PVCs. The left posterior surface, block is new.  Assessment/Plan Present on Admission:  . SOB (shortness of breath)  This patient was discussed with the ED physician, including pertinent vitals, physical exam findings, labs, and imaging.  We also discussed care given by the ED provider.  #1 shortness of breath  Admit to telemetry  The patient exhibits no other symptoms of heart failure except for rectal edema and 20 pound weight gain.  Additionally, despite the slightly elevated proBNP, we do not know his baseline. Additionally, he has absence of pulmonary edema on chest x-ray and CTA.  CTA shows some mild lymphadenitis in the mediastinum - which is possibly postinflammatory from emphysema or possibly sarcoidosis  Additionally, the patient's EKG is different from previous with both the left posterior fascicular block and multiple PVCs.  From exam, I tend to favor  COPD/emphysema. I will start the patient on Solu-Medrol and inhaler treatments.  CRP and ESR prior to steroids  We'll obtain echocardiogram  Telemetry monitoring  If not improved, consult pulmonology #2 peripheral edema   Lasix 20 mg IV twice a day - will not be aggressive given that the patient has no pulmonary edema #3 diabetes  Continue home medications #4 hypertension  Continue carvedilol  DVT prophylaxis: Lovenox  Consultants: None  Code Status: Full code  Family Communication: Sister in the room   Disposition Plan: Admit to telemetry   Truett Mainland, DO [facility] Pager [phone]

## 2015-06-27 NOTE — ED Notes (Signed)
Pt using urinal and missed part of urinal and urine on the floor and in chair and pt's gown; pt's gown changed and floor cleaned up

## 2015-06-28 ENCOUNTER — Inpatient Hospital Stay (HOSPITAL_COMMUNITY): Payer: 59

## 2015-06-28 DIAGNOSIS — J441 Chronic obstructive pulmonary disease with (acute) exacerbation: Secondary | ICD-10-CM

## 2015-06-28 DIAGNOSIS — J9601 Acute respiratory failure with hypoxia: Principal | ICD-10-CM

## 2015-06-28 DIAGNOSIS — F17200 Nicotine dependence, unspecified, uncomplicated: Secondary | ICD-10-CM

## 2015-06-28 DIAGNOSIS — J9690 Respiratory failure, unspecified, unspecified whether with hypoxia or hypercapnia: Secondary | ICD-10-CM

## 2015-06-28 DIAGNOSIS — E119 Type 2 diabetes mellitus without complications: Secondary | ICD-10-CM

## 2015-06-28 LAB — BASIC METABOLIC PANEL
ANION GAP: 5 (ref 5–15)
BUN: 13 mg/dL (ref 6–20)
CALCIUM: 8.8 mg/dL — AB (ref 8.9–10.3)
CHLORIDE: 99 mmol/L — AB (ref 101–111)
CO2: 33 mmol/L — ABNORMAL HIGH (ref 22–32)
Creatinine, Ser: 0.72 mg/dL (ref 0.61–1.24)
GFR calc Af Amer: >60 mL/min (ref >60)
GFR calc non Af Amer: >60 mL/min (ref >60)
GLUCOSE: 330 mg/dL — AB (ref 65–99)
Potassium: 4.3 mmol/L (ref 3.5–5.1)
Sodium: 137 mmol/L (ref 135–145)

## 2015-06-28 LAB — CBC
HEMATOCRIT: 45 % (ref 39.0–52.0)
HEMOGLOBIN: 14.7 g/dL (ref 13.0–17.0)
MCH: 32.5 pg (ref 26.0–34.0)
MCHC: 32.7 g/dL (ref 30.0–36.0)
MCV: 99.3 fL (ref 78.0–100.0)
Platelets: 173 10*3/uL (ref 150–400)
RBC: 4.53 MIL/uL (ref 4.22–5.81)
RDW: 12.5 % (ref 11.5–15.5)
WBC: 6.6 10*3/uL (ref 4.0–10.5)

## 2015-06-28 LAB — GLUCOSE, CAPILLARY
GLUCOSE-CAPILLARY: 211 mg/dL — AB (ref 65–99)
GLUCOSE-CAPILLARY: 256 mg/dL — AB (ref 65–99)

## 2015-06-28 LAB — C-REACTIVE PROTEIN: CRP: 0.9 mg/dL (ref <1.0)

## 2015-06-28 MED ORDER — FUROSEMIDE 20 MG PO TABS
20.0000 mg | ORAL_TABLET | Freq: Every day | ORAL | Status: DC
  Administered 2015-06-28 – 2015-06-29 (×2): 20 mg via ORAL
  Filled 2015-06-28 (×2): qty 1

## 2015-06-28 MED ORDER — ALBUTEROL SULFATE (2.5 MG/3ML) 0.083% IN NEBU
2.5000 mg | INHALATION_SOLUTION | Freq: Three times a day (TID) | RESPIRATORY_TRACT | Status: DC
  Administered 2015-06-28 – 2015-06-29 (×5): 2.5 mg via RESPIRATORY_TRACT
  Filled 2015-06-28 (×5): qty 3

## 2015-06-28 MED ORDER — PREDNISONE 20 MG PO TABS
40.0000 mg | ORAL_TABLET | Freq: Every day | ORAL | Status: DC
  Administered 2015-06-29: 40 mg via ORAL
  Filled 2015-06-28: qty 2

## 2015-06-28 MED ORDER — INSULIN ASPART 100 UNIT/ML ~~LOC~~ SOLN
0.0000 [IU] | Freq: Three times a day (TID) | SUBCUTANEOUS | Status: DC
  Administered 2015-06-28: 3 [IU] via SUBCUTANEOUS
  Administered 2015-06-28: 5 [IU] via SUBCUTANEOUS
  Administered 2015-06-29: 2 [IU] via SUBCUTANEOUS

## 2015-06-28 MED ORDER — INSULIN ASPART 100 UNIT/ML ~~LOC~~ SOLN
0.0000 [IU] | Freq: Every day | SUBCUTANEOUS | Status: DC
  Administered 2015-06-28: 3 [IU] via SUBCUTANEOUS

## 2015-06-28 NOTE — Progress Notes (Signed)
PROGRESS NOTE  Clayton [name] YJE:[id number] DOB: [birth year] DOA: 06/27/2015 PCP: Sherrie Mustache, MD  Summary: 64 y.o. male with a history of hypertension, diabetes, peripheral vascular disease, hyperlipidemia, and a prior MI presented with worsening dyspnea over the past month. Oxygenation saturations in his doctor's office was in the mid 59s.  He also complained of worsening edema and a 10 pound weight gain over the past 2 weeks. Labs revealed a BNP of 463. CXR was otherwise unremarkable. Admitted for further management.  Assessment/Plan: 1. Acute respiratory failure with hypoxia, improving. Wean off supplemental O2 as tolerated. 2. COPD exacerbation. Improving. Suspect has unmasked hypoxia. CXR unremarkable. BNP 436. CT scan showed nonspecific bilateral hilar lymphadenopathy. This might be postinflammatory in light of bilateral pulmonary emphysema and mild bronchiectasis. Sarcoidosis also is a consideration. The absence of mediastinal lymphadenopathy or lung nodule argue against malignant etiology, but given history of left renal neoplasm metastatic disease is not excluded. 3. Peripheral edema, improving. Continue Lasix. Echo on file, normal LVEF. No comment on diastolic function. 4. DM type 2, continue home meds. 5. Essential HTN. Continue home meds.  6. CAD, per Dr. Harl Bowie 10/7 "hx of PCI in 2009. 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 LVEF at that time by LVgram 55%. Last visit reported some DOE. No chest pain. Since last visit completed echo that showed LVEF 55% 7. Tobacco use disorder. Counseled on cessation.    Overall improved. Continue steroids and breathing treatments. Transition to oral Lasix.  Anticipate discharge within 1-2 days.    Code Status: Full DVT prophylaxis: Lovenox Family Communication: No family at bedside. Discussed with patient who understands and has no concerns at this time. Disposition Plan: Anticipate  discharge within 1-2 days.    Murray Hodgkins, MD  [facility]  Pager [phone] If 7PM-7AM, please contact night-coverage at [email or web address], password [facility] 06/28/2015, 7:49 AM  LOS: 1 day   Consultants:    Procedures:    Antibiotics:    HPI/Subjective: Feels better. Denies any pain, nausea, or vomiting. Has an appetite. Unable to assess if he is SOB because he has not moved around much.  Objective: Filed Vitals:   06/27/15 2123 06/28/15 0006 06/28/15 0637 06/28/15 0736  BP: 119/79  135/77   Pulse: 85  89   Temp: 98 F (36.7 C)  98.5 F (36.9 C)   TempSrc: Oral  Oral   Resp: 22  22   Height:      Weight:      SpO2: 95% 92% 93% 93%    Intake/Output Summary (Last 24 hours) at 06/28/15 0749 Last data filed at 06/27/15 2005  Gross per 24 hour  Intake      0 ml  Output    575 ml  Net   -575 ml     Filed Weights   06/27/15 1604 06/27/15 2000  Weight: 95.255 kg (210 lb) 98.113 kg (216 lb 4.8 oz)    Exam:    VSS, afebrile, not hypoxic General:  Appears comfortable, calm. Cardiovascular: Regular rate and rhythm, no murmur, rub or gallop. Trace edema BLE Telemetry: Sinus rhythm, no arrhythmias  Respiratory: Diminished breath sounds, no wheezes, rales or rhonchi. Normal respiratory effort. Abdomen: soft, ntnd Musculoskeletal: grossly normal tone bilateral upper and lower extremities Psychiatric: grossly normal mood and affect, speech fluent and appropriate Neurologic: grossly non-focal.  New data reviewed:  BMP unremarkable  CBC unremarkable  BS 330  Pertinent data since admission:  D-dimer 0.70  CTA of the chest IMPRESSION: 1. No evidence of acute pulmonary embolus. 2. Nonspecific bilateral hilar lymphadenopathy. This might be postinflammatory in light of bilateral pulmonary emphysema and mild bronchiectasis. Sarcoidosis also is a consideration. The absence of mediastinal lymphadenopathy or lung nodule argue against malignant etiology, but  given history of left renal neoplasm metastatic disease is not excluded.  Pending data:    Scheduled Meds: . albuterol  2.5 mg Nebulization TID  . antiseptic oral rinse  7 mL Mouth Rinse BID  . aspirin  81 mg Oral Daily  . carvedilol  25 mg Oral BID WC  . enoxaparin (LOVENOX) injection  40 mg Subcutaneous Q24H  . furosemide  20 mg Intravenous BID  . glipiZIDE  5 mg Oral BID  . metFORMIN  1,000 mg Oral BID WC  . methylPREDNISolone (SOLU-MEDROL) injection  60 mg Intravenous Q12H  . mometasone-formoterol  2 puff Inhalation BID  . nicotine  21 mg Transdermal Daily  . pantoprazole  40 mg Oral Daily  . pravastatin  40 mg Oral Daily   Continuous Infusions:   Principal Problem:   Acute respiratory failure with hypoxia (HCC) Active Problems:   TOBACCO ABUSE   COPD exacerbation ([place])   DM type 2 (diabetes mellitus, type 2) ([place])   Time spent 25 minutes   By signing my name below, I, [name] attest that this documentation has been prepared under the direction and in the presence of Murray Hodgkins, MD Electronically signed: [name], Scribe.  06/28/2015 1:22pm  I personally performed the services described in this documentation. All medical record entries made by the scribe were at my direction. I have reviewed the chart and agree that the record reflects my personal performance and is accurate and complete. Murray Hodgkins, MD

## 2015-06-28 NOTE — Care Management Note (Signed)
Case Management Note  Patient Details  Name: Clayton [name] MRN: [id number] Date of Birth: 05-31-1951  Subjective/Objective:                  Pt admitted from home with SOB, COPD exacerbation. Pt lives alone and will return home at discharge. Pt is independent with ADl's.  Action/Plan: Will continue to follow for discharge planning needs. Will need home O2 assessment prior to discharge.  Expected Discharge Date:                  Expected Discharge Plan:  Home/Self Care  In-House Referral:  NA  Discharge planning Services  CM Consult  Post Acute Care Choice:  NA Choice offered to:  NA  DME Arranged:    DME Agency:     HH Arranged:    HH Agency:     Status of Service:  In process, will continue to follow  Medicare Important Message Given:    Date Medicare IM Given:    Medicare IM give by:    Date Additional Medicare IM Given:    Additional Medicare Important Message give by:     If discussed at [place] of Stay Meetings, dates discussed:    Additional Comments:  Joylene Draft, RN 06/28/2015, 2:45 PM

## 2015-06-28 NOTE — Progress Notes (Signed)
Patient blood sugar 330 this am. Patient has history of DM and is also on Solu-medrol. Patients takes PO diabeteic medications. Given this am as ordered. Dr. Sarajane Jews notified of blood sugar level and that patient is not on CBG checks or sliding scale.

## 2015-06-29 ENCOUNTER — Inpatient Hospital Stay (HOSPITAL_COMMUNITY): Payer: 59

## 2015-06-29 LAB — GLUCOSE, CAPILLARY
GLUCOSE-CAPILLARY: 273 mg/dL — AB (ref 65–99)
Glucose-Capillary: 114 mg/dL — ABNORMAL HIGH (ref 65–99)
Glucose-Capillary: 161 mg/dL — ABNORMAL HIGH (ref 65–99)

## 2015-06-29 MED ORDER — TIOTROPIUM BROMIDE MONOHYDRATE 18 MCG IN CAPS: 18.0000 ug | ORAL_CAPSULE | Freq: Every day | RESPIRATORY_TRACT | Status: DC

## 2015-06-29 MED ORDER — ALBUTEROL SULFATE HFA 108 (90 BASE) MCG/ACT IN AERS: 2.0000 | INHALATION_SPRAY | Freq: Four times a day (QID) | RESPIRATORY_TRACT | Status: DC | PRN

## 2015-06-29 MED ORDER — PREDNISONE 10 MG PO TABS
ORAL_TABLET | ORAL | Status: DC
Start: 2015-06-29 — End: 2015-11-10

## 2015-06-29 NOTE — Progress Notes (Signed)
Patient's O2 sat on RA at rest 86%.  Increased to 95% when placed on 2L O2.  When ambulating on 2L, sats at 89%.  Patient tolerated well, though and does not report feeling SOB while ambulating.

## 2015-06-29 NOTE — Care Management Note (Signed)
Case Management Note  Patient Details  Name: Clayton [name] MRN: [id number] Date of Birth: [birth year]  Subjective/Objective:                    Action/Plan:   Expected Discharge Date:                  Expected Discharge Plan:  Home/Self Care  In-House Referral:  NA  Discharge planning Services  CM Consult  Post Acute Care Choice:  Durable Medical Equipment Choice offered to:  Patient  DME Arranged:  Oxygen DME Agency:  [facility]  HH Arranged:    [place] Agency:     Status of Service:  Completed, signed off  Medicare Important Message Given:    Date Medicare IM Given:    Medicare IM give by:    Date Additional Medicare IM Given:    Additional Medicare Important Message give by:     If discussed at [place] of Stay Meetings, dates discussed:    Additional Comments: Pt discharged home today with home O2 from [place] (per pts choice). Orders faxed to [facility] and portable will be delivered to pts room prior to discharge. No further CM needs noted. Pt and pts nurse aware of discharge arrangements. Christinia Gully [place], RN 06/29/2015, 12:56 PM

## 2015-06-29 NOTE — Progress Notes (Signed)
PROGRESS NOTE  Clayton [name] [id number] DOB: 04-Jun-1951 DOA: 06/27/2015 PCP: Sherrie Mustache, MD  Summary: 65 y.o. male with a history of hypertension, diabetes, peripheral vascular disease, hyperlipidemia, and a prior MI presented with worsening dyspnea over the past month. Oxygenation saturations in his doctor's office was in the mid 57s.  He also complained of worsening edema and a 10 pound weight gain over the past 2 weeks. Labs revealed a BNP of 463. CXR was otherwise unremarkable. Admitted for further management.  Assessment/Plan: 1. Acute respiratory failure with hypoxia, improved. Will need home O2. 2. COPD exacerbation. Improved. Suspect has unmasked hypoxia. CXR unremarkable. BNP 436. CT scan showed nonspecific bilateral hilar lymphadenopathy. This might be postinflammatory in light of bilateral pulmonary emphysema and mild bronchiectasis. Sarcoidosis also is a consideration. The absence of mediastinal lymphadenopathy or lung nodule argue against malignant etiology, but given history of left renal neoplasm metastatic disease is not excluded. 3. Peripheral edema, resolved. Echo on file, normal LVEF. No comment on diastolic function. 4. DM type 2, continue home meds. 5. Essential HTN. Continue home meds.  6. CAD, stable.  7. Tobacco use disorder. Counseled on cessation.    Overall improved. Plan steroid taper, home oxygen, with outpatient follow up. Discharge today.    Code Status: Full DVT prophylaxis: Lovenox Family Communication: No family at bedside. Discussed with patient who understands and has no concerns at this time. Disposition Plan: Discharge today.   Murray Hodgkins, MD  [facility]  Pager [phone] If 7PM-7AM, please contact night-coverage at [email or web address], password [facility] 06/29/2015, 7:19 AM  LOS: 2 days   Consultants:    Procedures:    Antibiotics:    HPI/Subjective: Has a very mild cough but overall feels well. Has an appetite and  is ready to go home.  Objective: Filed Vitals:   06/28/15 1424 06/28/15 1938 06/28/15 2019 06/29/15 0637  BP:   107/58 141/78  Pulse:   67 75  Temp:   98.5 F (36.9 C) 97.9 F (36.6 C)  TempSrc:   Oral Oral  Resp:   20 20  Height:      Weight:      SpO2: 89% 92% 95% 97%    Intake/Output Summary (Last 24 hours) at 06/29/15 0719 Last data filed at 06/28/15 1709  Gross per 24 hour  Intake    720 ml  Output    950 ml  Net   -230 ml     Filed Weights   06/27/15 1604 06/27/15 2000  Weight: 95.255 kg (210 lb) 98.113 kg (216 lb 4.8 oz)    Exam:    VSS, afebrile, not hypoxic General:  Appears calm and comfortable Cardiovascular: RRR, no m/r/g. No LE edema. Respiratory: CTA bilaterally, Few wheezes, No r/r. Normal respiratory effort. Abdomen: soft, ntnd Psychiatric: grossly normal mood and affect, speech fluent and appropriate  New data reviewed:  CBG stable  Pertinent data since admission:  CTA of the chest IMPRESSION: 1. No evidence of acute pulmonary embolus. 2. Nonspecific bilateral hilar lymphadenopathy. This might be postinflammatory in light of bilateral pulmonary emphysema and mild bronchiectasis. Sarcoidosis also is a consideration. The absence of mediastinal lymphadenopathy or lung nodule argue against malignant etiology, but given history of left renal neoplasm metastatic disease is not excluded.  Pending data:    Scheduled Meds: . albuterol  2.5 mg Nebulization TID  . antiseptic oral rinse  7 mL Mouth Rinse BID  . aspirin  81 mg Oral Daily  . carvedilol  25 mg Oral BID  WC  . enoxaparin (LOVENOX) injection  40 mg Subcutaneous Q24H  . furosemide  20 mg Oral Daily  . glipiZIDE  5 mg Oral BID  . insulin aspart  0-5 Units Subcutaneous QHS  . insulin aspart  0-9 Units Subcutaneous TID WC  . mometasone-formoterol  2 puff Inhalation BID  . nicotine  21 mg Transdermal Daily  . pantoprazole  40 mg Oral Daily  . pravastatin  40 mg Oral Daily  .  predniSONE  40 mg Oral Q breakfast   Continuous Infusions:   Principal Problem:   Acute respiratory failure with hypoxia (HCC) Active Problems:   TOBACCO ABUSE   COPD exacerbation ([place])   DM type 2 (diabetes mellitus, type 2) ([place])    By signing my name below, I, [name] attest that this documentation has been prepared under the direction and in the presence of Murray Hodgkins, MD Electronically signed: [name], Scribe.  06/29/2015 12:30pm  I personally performed the services described in this documentation. All medical record entries made by the scribe were at my direction. I have reviewed the chart and agree that the record reflects my personal performance and is accurate and complete. Murray Hodgkins, MD

## 2015-06-29 NOTE — Progress Notes (Signed)
Patient discharged home.  IV removed - WNL.  Home O2 set up per CM.  Reviewed DC instructions and medications.  Follow up in place with PCP.  Verbalizes understanding.  Smoking cessation advised and encouraged to wear O2 at all times.  All questions answered at this time.  Stable to DC home, assisted off unit via [facility] with RN assist.

## 2015-06-29 NOTE — Discharge Summary (Signed)
Physician Discharge Summary  Clayton [name] [id number] DOB: October 24, 1950 DOA: 06/27/2015  PCP: Sherrie Mustache, MD  Admit date: 06/27/2015 Discharge date: 06/29/2015  Recommendations for Outpatient Follow-up:  1. Follow up with PCP 10/18 as below for resolution of COPD exacerbation. Started on bronchodilators, steroid taper, and home O2 2. Follow up CT scan--consider repeat in 4 weeks. See below. CT scan showed nonspecific bilateral hilar lymphadenopathy. This might be postinflammatory in light of bilateral pulmonary emphysema and mild bronchiectasis. Sarcoidosis also is a consideration. The absence of mediastinal lymphadenopathy or lung nodule argue against malignant etiology, but given history of left renal neoplasm metastatic disease is not excluded. 3. Could consider referral to pulmonologist.   Follow-up Information    Follow up with [facility], Karie Schwalbe, RN On 07/04/2015.   Specialty:  Adult Health Nurse Practitioner   Why:  1:15   Contact information:   [place] [phone]        Discharge Diagnoses:  1. Acute respiratory failure with hypoxia. 2. COPD exacerbation 3. DM type 2  4. Essential HTN. 5. CAD 6. Tobacco use disorder.   Discharge Condition: Improved Disposition: Home  Diet recommendation: Heart-healthy  Filed Weights   06/27/15 1604 06/27/15 2000  Weight: 95.255 kg (210 lb) 98.113 kg (216 lb 4.8 oz)    History of present illness:  64 y.o. male with a history of hypertension, diabetes, peripheral vascular disease, hyperlipidemia, and a prior MI presented with worsening dyspnea over the past month. Oxygenation saturations in his doctor's office was in the mid 85s. He also complained of worsening edema and a 10 pound weight gain over the past 2 weeks. Labs revealed a BNP of 463. CXR was otherwise unremarkable. Admitted for further management.  Hospital Course:  Rapidly improved with steroids, nebs and oxygen.  Hospitalization was uncomplicated. However, will need home oxygen as below. CT findings of unclear significance and can be followed up as an outpatient.   1. Acute respiratory failure with hypoxia, improved. Will need home O2. 2. COPD exacerbation. Improved. Suspect exacerbation unmasked chronic hypoxia. CT scan showed nonspecific bilateral hilar lymphadenopathy. This might be postinflammatory in light of bilateral pulmonary emphysema and mild bronchiectasis. Sarcoidosis also is a consideration. The absence of mediastinal lymphadenopathy or lung nodule argue against malignant etiology, but given history of left renal neoplasm metastatic disease is not excluded. 3. Peripheral edema, resolved. Echo on file, normal LVEF. No comment on diastolic function. 4. DM type 2, continue home meds. 5. Essential HTN. Continue home meds.  6. CAD, per Dr. Harl Bowie 10/7 "hx of PCI in 2009. 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 LVEF at that time by LVgram 55%. Last visit reported some DOE. No chest pain. Since last visit completed echo that showed LVEF 55% 7. Tobacco use disorder. Counseled on cessation.  Consultants:  none  Procedures:  none  Antibiotics:  none  Discharge Instructions Discharge Instructions    Diet - low sodium heart healthy    Complete by:  As directed      Diet Carb Modified    Complete by:  As directed      Discharge instructions    Complete by:  As directed   Call your physician or seek immediate medical attention for shortness of breath, wheezing, pain or worsening of condition.     Increase activity slowly    Complete by:  As directed  Discharge Medication List as of 06/29/2015  2:14 PM    START taking these medications   Details  albuterol (PROVENTIL HFA;VENTOLIN HFA) 108 (90 BASE) MCG/ACT inhaler Inhale 2 puffs into the lungs every 6 (six) hours as needed for wheezing or shortness of breath. Please instruct in  proper technique., Starting 06/29/2015, Until Discontinued, Normal    predniSONE (DELTASONE) 10 MG tablet Take 40 mg by mouth daily for 3 days, then take 20 mg by mouth daily for 3 days, then take 10 mg by mouth daily for 3 days, then stop., Normal    tiotropium (SPIRIVA HANDIHALER) 18 MCG inhalation capsule Place 1 capsule (18 mcg total) into inhaler and inhale daily. Please instruct in proper technique., Starting 06/29/2015, Until Discontinued, Normal      CONTINUE these medications which have NOT CHANGED   Details  aspirin 81 MG tablet Take 81 mg by mouth daily., Until Discontinued, Historical Med    carvedilol (COREG) 25 MG tablet Take 1 tablet (25 mg total) by mouth 2 (two) times daily with a meal., Starting 10/11/2014, Until Discontinued, Normal    ferrous sulfate 325 (65 FE) MG tablet Take 325 mg by mouth daily with breakfast., Until Discontinued, Historical Med    fish oil-omega-3 fatty acids 1000 MG capsule Take 2 g by mouth daily., Until Discontinued, Historical Med    Fluticasone-Salmeterol (ADVAIR DISKUS) 250-50 MCG/DOSE AEPB Inhale 1 puff into the lungs 2 (two) times daily as needed., Starting 05/25/2015, Until Discontinued, Historical Med    furosemide (LASIX) 20 MG tablet Take 2 tablets (40 mg total) by mouth daily., Starting 06/23/2015, Until Discontinued, Normal    glipiZIDE (GLUCOTROL XL) 5 MG 24 hr tablet Take 1 tablet by mouth 2 (two) times daily., Starting 05/14/2015, Until Discontinued, Historical Med    metFORMIN (GLUCOPHAGE) 1000 MG tablet Take 1 tablet by mouth 2 (two) times daily., Starting 05/14/2015, Until Discontinued, Historical Med    omeprazole (PRILOSEC) 20 MG capsule Take 20 mg by mouth Daily., Starting 04/27/2012, Until Discontinued, Historical Med    pravastatin (PRAVACHOL) 40 MG tablet Take 1 tablet by mouth daily., Starting 05/14/2015, Until Discontinued, Historical Med    Saw [place]metto 1000 MG CAPS Take 1,000 mg by mouth daily., Until Discontinued,  Historical Med    sildenafil (VIAGRA) 50 MG tablet Take 1 tablet (50 mg total) by mouth daily as needed for erectile dysfunction., Starting 10/11/2014, Until Discontinued, Normal       No Known Allergies  The results of significant diagnostics from this hospitalization (including imaging, microbiology, ancillary and laboratory) are listed below for reference.    Significant Diagnostic Studies: Dg Chest 2 View  06/27/2015  CLINICAL DATA:  Shortness of breath EXAM: CHEST  2 VIEW COMPARISON:  05/24/2015 FINDINGS: There is chronic bilateral interstitial thickening. There is no focal parenchymal opacity. There is no pleural effusion or pneumothorax. The heart and mediastinal contours are unremarkable. The osseous structures are unremarkable. IMPRESSION: No active cardiopulmonary disease. Electronically Signed   By: Kathreen Devoid   On: 06/27/2015 17:29   Ct Angio Chest Pe W/cm &/or Wo Cm  06/27/2015  CLINICAL DATA:  64 year old male with bilateral lower extremity swelling for 1 month. Shortness of breath. Abnormal D-dimer. Initial encounter. 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 EXAM: CT ANGIOGRAPHY CHEST WITH CONTRAST TECHNIQUE: Multidetector CT imaging of the chest was performed using the standard protocol during bolus administration of intravenous contrast. Multiplanar CT image reconstructions and MIPs were obtained to evaluate the vascular anatomy.  CONTRAST:  114mL OMNIPAQUE IOHEXOL 350 MG/ML SOLN COMPARISON:  Chest radiographs 1654 hr today. CT Abdomen and Pelvis 12/05/2014 FINDINGS: Adequate contrast bolus timing in the pulmonary arterial tree. No focal filling defect identified in the pulmonary arterial tree to suggest the presence of acute pulmonary embolism. No pericardial or pleural effusion. There is bilateral hilar lymphadenopathy. Individual nodes measure up to 14 mm short axis. The extent is slightly greater on the right. Mediastinal nodal stations however are within  normal limits. No axillary lymphadenopathy. Negative thoracic inlet. Calcified Coronary artery atherosclerosis. Negative visualized aorta. Stable visualized upper abdominal viscera since March. Centrilobular emphysema. Mild superimposed pulmonary septal thickening in the apices and periphery of the lungs. Major airways are patent. There is mild perihilar bronchiectasis. No consolidation. No pulmonary nodule. No acute osseous abnormality identified. Review of the MIP images confirms the above findings. IMPRESSION: 1.  No evidence of acute pulmonary embolus. 2. Nonspecific bilateral hilar lymphadenopathy. This might be postinflammatory in light of bilateral pulmonary emphysema and mild bronchiectasis. Sarcoidosis also is a consideration. The absence of mediastinal lymphadenopathy or lung nodule argue against malignant etiology, but given history of left renal neoplasm metastatic disease is not excluded. Electronically Signed   By: Genevie Ann M.D.   On: 06/27/2015 19:33      Labs: Basic Metabolic Panel:  Recent Labs Lab 06/27/15 1640 06/28/15 0548  NA 141 137  K 4.3 4.3  CL 107 99*  CO2 30 33*  GLUCOSE 195* 330*  BUN 12 13  CREATININE 0.66 0.72  CALCIUM 8.8* 8.8*   Liver Function Tests:  Recent Labs Lab 06/27/15 1640  AST 27  ALT 28  ALKPHOS 131*  BILITOT 1.2  PROT 7.4  ALBUMIN 3.8   CBC:  Recent Labs Lab 06/27/15 1640 06/28/15 0548  WBC 7.3 6.6  NEUTROABS 5.3  --   HGB 14.6 14.7  HCT 44.7 45.0  MCV 98.7 99.3  PLT 186 173       Recent Labs  06/27/15 1820  BNP 436.0*    CBG:  Recent Labs Lab 06/28/15 1125 06/28/15 1644 06/28/15 2022 06/29/15 0712 06/29/15 1123  GLUCAP 211* 256* 273* 114* 161*    Principal Problem:   Acute respiratory failure with hypoxia (HCC) Active Problems:   TOBACCO ABUSE   COPD exacerbation ([place])   DM type 2 (diabetes mellitus, type 2) ([place])   Time coordinating discharge: 35 minutes  Signed:  Murray Hodgkins, MD [facility] 06/29/2015, 7:23 AM  By signing my name below, I, [name] attest that this documentation has been prepared under the direction and in the presence of Murray Hodgkins, MD Electronically signed: [name], Scribe.  06/29/2015 12:30pm  I personally performed the services described in this documentation. All medical record entries made by the scribe were at my direction. I have reviewed the chart and agree that the record reflects my personal performance and is accurate and complete. Murray Hodgkins, MD

## 2015-07-05 ENCOUNTER — Other Ambulatory Visit (HOSPITAL_COMMUNITY): Payer: Self-pay | Admitting: Adult Health Nurse Practitioner

## 2015-07-05 DIAGNOSIS — J42 Unspecified chronic bronchitis: Secondary | ICD-10-CM

## 2015-07-07 ENCOUNTER — Ambulatory Visit (HOSPITAL_COMMUNITY)
Admission: RE | Admit: 2015-07-07 | Discharge: 2015-07-07 | Disposition: A | Discharge status: Home / Self Care | Payer: 59 | Source: Ambulatory Visit | Attending: Adult Health Nurse Practitioner | Admitting: Adult Health Nurse Practitioner

## 2015-07-07 DIAGNOSIS — R0600 Dyspnea, unspecified: Secondary | ICD-10-CM | POA: Diagnosis not present

## 2015-07-07 DIAGNOSIS — R590 Localized enlarged lymph nodes: Secondary | ICD-10-CM | POA: Insufficient documentation

## 2015-07-07 DIAGNOSIS — J42 Unspecified chronic bronchitis: Secondary | ICD-10-CM | POA: Diagnosis not present

## 2015-07-07 DIAGNOSIS — R918 Other nonspecific abnormal finding of lung field: Secondary | ICD-10-CM | POA: Diagnosis not present

## 2015-08-19 ENCOUNTER — Other Ambulatory Visit: Payer: Self-pay | Admitting: Cardiology

## 2015-08-21 NOTE — Telephone Encounter (Signed)
Clayton Lenis, MD at 06/23/2015 8:20 AM  carvedilol (COREG) 25 MG tabletTake 1 tablet (25 mg total) by mouth 2 (two) times daily with a meal Patient Instructions     Your physician recommends that you schedule a follow-up appointment in: 3 months with Dr. Harl Bowie  Your physician recommends that you continue on your current medications as directed. Please refer to the Current Medication list given to you today

## 2015-09-01 ENCOUNTER — Other Ambulatory Visit: Payer: Self-pay | Admitting: Cardiology

## 2015-09-21 ENCOUNTER — Ambulatory Visit: Payer: 59 | Admitting: Cardiology

## 2015-09-25 ENCOUNTER — Other Ambulatory Visit (HOSPITAL_COMMUNITY): Payer: Self-pay | Admitting: Adult Health Nurse Practitioner

## 2015-09-25 DIAGNOSIS — R918 Other nonspecific abnormal finding of lung field: Secondary | ICD-10-CM

## 2015-09-28 ENCOUNTER — Ambulatory Visit (HOSPITAL_COMMUNITY)
Admission: RE | Admit: 2015-09-28 | Discharge: 2015-09-28 | Disposition: A | Discharge status: Home / Self Care | Payer: 59 | Source: Ambulatory Visit | Attending: Adult Health Nurse Practitioner | Admitting: Adult Health Nurse Practitioner

## 2015-09-28 DIAGNOSIS — J432 Centrilobular emphysema: Secondary | ICD-10-CM | POA: Insufficient documentation

## 2015-09-28 DIAGNOSIS — I2510 Atherosclerotic heart disease of native coronary artery without angina pectoris: Secondary | ICD-10-CM | POA: Insufficient documentation

## 2015-09-28 DIAGNOSIS — R918 Other nonspecific abnormal finding of lung field: Secondary | ICD-10-CM | POA: Diagnosis present

## 2015-09-28 DIAGNOSIS — R590 Localized enlarged lymph nodes: Secondary | ICD-10-CM | POA: Insufficient documentation

## 2015-10-20 ENCOUNTER — Ambulatory Visit: Payer: 59 | Admitting: Cardiology

## 2015-11-08 ENCOUNTER — Encounter: Payer: 59 | Admitting: Cardiology

## 2015-11-08 NOTE — Progress Notes (Signed)
Patient ID: Clayton [name], male   DOB: [birth year], 65 y.o.   MRN: [id number]   Encounter opened in error

## 2015-11-10 ENCOUNTER — Encounter: Payer: Self-pay | Admitting: Cardiology

## 2015-11-10 ENCOUNTER — Ambulatory Visit (INDEPENDENT_AMBULATORY_CARE_PROVIDER_SITE_OTHER): Payer: 59 | Admitting: Cardiology

## 2015-11-10 VITALS — BP 168/102 | HR 81 | Ht 74.0 in | Wt 219.6 lb

## 2015-11-10 DIAGNOSIS — I2510 Atherosclerotic heart disease of native coronary artery without angina pectoris: Secondary | ICD-10-CM | POA: Diagnosis not present

## 2015-11-10 DIAGNOSIS — I10 Essential (primary) hypertension: Secondary | ICD-10-CM | POA: Diagnosis not present

## 2015-11-10 DIAGNOSIS — R0602 Shortness of breath: Secondary | ICD-10-CM | POA: Diagnosis not present

## 2015-11-10 MED ORDER — LISINOPRIL 5 MG PO TABS: 5.0000 mg | ORAL_TABLET | Freq: Every day | ORAL | Status: DC

## 2015-11-10 NOTE — Progress Notes (Signed)
Patient ID: Brextyn Linwood, male   DOB: [birth year], 65 y.o.   MRN: [id number] Patient ID: Bufford Suthers, male   DOB: 04/01/1951, 65 y.o.   MRN: [id number]     Clinical Summary Mr. Venn is a 65 y.o.male seen today for follow up of the following medical problems.  1. CAD - hx of PCI in 2009. 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 LVEF at that time by LVgram 55%.  - 05/2015 echo LVEF 55%, mild AI - denies any chest pain since last visit    2. Tobacco history - tobacco x 40 years - notes some coughing and sob at times.  - has never been tested for COPD  3. HTN - mixed compliance with meds Past Medical History  Diagnosis Date  . HTN (hypertension)   . Diabetes mellitus   . PVD (peripheral vascular disease) ([place])   . Hyperlipidemia   . Myocardial infarction ([place]) 2009  . Prostate cancer ([place])      No Known Allergies   Current Outpatient Prescriptions  Medication Sig Dispense Refill  . albuterol (PROVENTIL HFA;VENTOLIN HFA) 108 (90 BASE) MCG/ACT inhaler Inhale 2 puffs into the lungs every 6 (six) hours as needed for wheezing or shortness of breath. Please instruct in proper technique. 1 Inhaler 1  . aspirin 81 MG tablet Take 81 mg by mouth daily.    . carvedilol (COREG) 25 MG tablet TAKE 1 TABLET (25 MG TOTAL) BY MOUTH 2 (TWO) TIMES DAILY WITH A MEAL. 180 tablet 0  . ferrous sulfate 325 (65 FE) MG tablet Take 325 mg by mouth daily with breakfast.    . fish oil-omega-3 fatty acids 1000 MG capsule Take 2 g by mouth daily.    . Fluticasone-Salmeterol (ADVAIR DISKUS) 250-50 MCG/DOSE AEPB Inhale 1 puff into the lungs 2 (two) times daily as needed.    . furosemide (LASIX) 20 MG tablet Take 2 tablets (40 mg total) by mouth daily. 30 tablet 3  . glipiZIDE (GLUCOTROL XL) 5 MG 24 hr tablet Take 1 tablet by mouth 2 (two) times daily.    . metFORMIN (GLUCOPHAGE) 1000 MG tablet Take 1 tablet by mouth 2 (two) times daily.    Marland Kitchen omeprazole (PRILOSEC)  20 MG capsule Take 20 mg by mouth Daily.    . pravastatin (PRAVACHOL) 40 MG tablet Take 1 tablet by mouth daily.    . predniSONE (DELTASONE) 10 MG tablet Take 40 mg by mouth daily for 3 days, then take 20 mg by mouth daily for 3 days, then take 10 mg by mouth daily for 3 days, then stop. 21 tablet 0  . Saw Palmetto 1000 MG CAPS Take 1,000 mg by mouth daily.    . sildenafil (VIAGRA) 50 MG tablet Take 1 tablet (50 mg total) by mouth daily as needed for erectile dysfunction. 10 tablet 6  . tiotropium (SPIRIVA HANDIHALER) 18 MCG inhalation capsule Place 1 capsule (18 mcg total) into inhaler and inhale daily. Please instruct in proper technique. 30 capsule 0   No current facility-administered medications for this visit.     Past Surgical History  Procedure Laterality Date  . Cardiac stents  2009  . Cataract extraction w/phaco Left 08/19/2013    Procedure: CATARACT EXTRACTION PHACO AND INTRAOCULAR LENS PLACEMENT (IOC);  Surgeon: Tonny Iley Deignan, MD;  Location: [facility];  Service: Ophthalmology;  Laterality: Left;  CDE:37.77     No Known Allergies    Family History  Problem Relation Age  of Onset  . Coronary artery disease Mother   . Coronary artery disease Father   . Prostate cancer Father      Social History Mr. Mitri reports that he has been smoking Cigarettes.  He started smoking about 46 years ago. He has a 15 pack-year smoking history. He has never used smokeless tobacco. Mr. Doody reports that he does not drink alcohol.   Review of Systems CONSTITUTIONAL: No weight loss, fever, chills, weakness or fatigue.  HEENT: Eyes: No visual loss, blurred vision, double vision or yellow sclerae.No hearing loss, sneezing, congestion, runny nose or sore throat.  SKIN: No rash or itching.  CARDIOVASCULAR: per hpi RESPIRATORY: per hpi  GASTROINTESTINAL: No anorexia, nausea, vomiting or diarrhea. No abdominal pain or blood.  GENITOURINARY: No burning on urination, no polyuria NEUROLOGICAL: No  headache, dizziness, syncope, paralysis, ataxia, numbness or tingling in the extremities. No change in bowel or bladder control.  MUSCULOSKELETAL: No muscle, back pain, joint pain or stiffness.  LYMPHATICS: No enlarged nodes. No history of splenectomy.  PSYCHIATRIC: No history of depression or anxiety.  ENDOCRINOLOGIC: No reports of sweating, cold or heat intolerance. No polyuria or polydipsia.  Marland Kitchen   Physical Examination Filed Vitals:   11/10/15 0913  BP: 168/102  Pulse: 81   Filed Vitals:   11/10/15 0913  Height: 6\' 2"  (1.88 m)  Weight: 219 lb 9.6 oz (99.61 kg)    Gen: resting comfortably, no acute distress HEENT: no scleral icterus, pupils equal round and reactive, no palptable cervical adenopathy,  CV: RRR, no m/r/g, no jvd Resp: coarse breath sounds bilaterally GI: abdomen is soft, non-tender, non-distended, normal bowel sounds, no hepatosplenomegaly MSK: extremities are warm, no edema.  Skin: warm, no rash Neuro:  no focal deficits Psych: appropriate affect   Diagnostic Studies 11/2007 Cath HEMODYNAMIC RESULTS: Aorta 95/83 mmHg. Left ventricle 94/22 mmHg.  ANGIOGRAPHIC FINDINGS: 1. Left main coronary artery is free of significant flow-limiting  coronary atherosclerosis and gives rise to the left anterior  descending and circumflex vessels. 2. Left anterior descending is a medium-caliber vessel that extends  down to the apex. There are 3 diagonal branches. Within the  proximal portion of the vessel distal to the first diagonal Simona Rocque  is an area of 40% to 50% stenosis followed by an area of 30%  stenosis in the midvessel. In the distal vessel there is an area  of 30% diffuse stenosis. Flow was TIMI-3 in this vessel. 3. The circumflex vessel is medium in caliber. There are 4 obtuse  marginal branches. The first Marena Witts is the largest. Within this  Annsley Akkerman is an area of relatively focal 60% to 70% stenosis  surrounded  by an area of approximately 50% to 60% stenosis in  diffuse fashion. Distal to this is an area of 50% more focal  stenosis. 4. Otherwise, there are relatively mild luminal irregularities to  approximately 20% in the circumflex vessel and an area of 30%  stenosis within the fourth obtuse marginal Kaleeya Hancock. 5. Right coronary artery is occluded in the proximal to midvessel  segment. There are faint left-to-right collaterals that fill a  portion of the distal vessel, although the remainder of the vessel  is not well seen.  LEFT VENTRICULOGRAPHY: Performed in the RAO projection and reveals an ejection fraction of approximately 55% with mid to basal inferior akinesis and trace mitral regurgitation.  DIAGNOSES: 1. Coronary atherosclerosis as outlined including an occluded proximal  to mid right coronary artery associated with faint left-to-right  collateral filling of  the distal vessel. There is otherwise  moderate left system disease including 60% to 70% stenosis  involving the obtuse marginal and 40% to 50% stenosis in the  proximal left anterior descending. 2. Left ventricular ejection fraction of approximately 55% with mid to  basal inferior akinesis, trace mitral regurgitation and left  ventricular end-diastolic pressure of 22 mmHg.  DISCUSSION: I reviewed the results with the patient and also discussed the films with Dr. Lia Foyer. At this point, I anticipate percutaneous intervention to treat the right coronary artery and otherwise medical therapy.   05/2015 echo  Study Conclusions  - Left ventricle: Technically dificiult study. 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 The cavity size was mildly dilated. Wall thickness was increased in a pattern of mild LVH. The estimated ejection fraction was 55%. - Aortic valve: Sclerosis without stenosis. There was mild regurgitation. - Right ventricle:  The cavity size was normal. Systolic function was normal.   Assessment and Plan  1. CAD - no recent chest pain, continue current meds  2. HTN - elevated in clinic, will start lisionpril 5mg  daily. Check BMET in 2 weeks.   3. SOB - long tobacco history, will order PFTs  F/u 60months       Arnoldo Lenis, M.D.

## 2015-11-10 NOTE — Patient Instructions (Signed)
Your physician wants you to follow-up in: [place] DR. BRANCH You will receive a reminder letter in the mail two months in advance. If you don't receive a letter, please call our office to schedule the follow-up appointment.  Your physician has recommended you make the following change in your medication:   START LISINOPRIL 5 MG DAILY  Your physician has recommended that you have a pulmonary function test. Pulmonary Function Tests are a group of tests that measure how well air moves in and out of your lungs.  Your physician recommends that you return for lab work in: 2 WEEKS BMP  Thank you for choosing [facility]!!

## 2015-11-22 ENCOUNTER — Ambulatory Visit (HOSPITAL_COMMUNITY)
Admission: RE | Admit: 2015-11-22 | Discharge: 2015-11-22 | Disposition: A | Discharge status: Home / Self Care | Payer: 59 | Source: Ambulatory Visit | Attending: Cardiology | Admitting: Cardiology

## 2015-11-22 DIAGNOSIS — R0602 Shortness of breath: Secondary | ICD-10-CM | POA: Insufficient documentation

## 2015-11-22 LAB — PULMONARY FUNCTION TEST
DL/VA % PRED: 51 %
DL/VA: 2.43 ml/min/mmHg/L
DLCO UNC % PRED: 32 %
DLCO UNC: 11.35 ml/min/mmHg
FEF 25-75 Post: 0.65 L/sec
FEF 25-75 Pre: 0.54 L/sec
FEF2575-%Change-Post: 21 %
FEF2575-%Pred-Post: 22 %
FEF2575-%Pred-Pre: 18 %
FEV1-%CHANGE-POST: 9 %
FEV1-%Pred-Post: 42 %
FEV1-%Pred-Pre: 39 %
FEV1-Post: 1.41 L
FEV1-Pre: 1.3 L
FEV1FVC-%Change-Post: 1 %
FEV1FVC-%Pred-Pre: 72 %
FEV6-%Change-Post: 2 %
FEV6-%PRED-POST: 55 %
FEV6-%PRED-PRE: 54 %
FEV6-POST: 2.29 L
FEV6-PRE: 2.25 L
FEV6FVC-%CHANGE-POST: -2 %
FEV6FVC-%PRED-POST: 100 %
FEV6FVC-%PRED-PRE: 102 %
FVC-%Change-Post: 7 %
FVC-%Pred-Post: 57 %
FVC-%Pred-Pre: 53 %
FVC-Post: 2.45 L
FVC-Pre: 2.29 L
POST FEV6/FVC RATIO: 96 %
PRE FEV6/FVC RATIO: 98 %
Post FEV1/FVC ratio: 58 %
Pre FEV1/FVC ratio: 57 %

## 2015-11-22 MED ORDER — ALBUTEROL SULFATE (2.5 MG/3ML) 0.083% IN NEBU
2.5000 mg | INHALATION_SOLUTION | Freq: Once | RESPIRATORY_TRACT | Status: AC
  Administered 2015-11-22: 2.5 mg via RESPIRATORY_TRACT

## 2015-12-11 ENCOUNTER — Telehealth: Payer: Self-pay | Admitting: *Deleted

## 2015-12-11 DIAGNOSIS — J449 Chronic obstructive pulmonary disease, unspecified: Secondary | ICD-10-CM

## 2015-12-11 NOTE — Telephone Encounter (Signed)
-----   Message from Clayton [name], [place] sent at 12/06/2015  7:42 AM EDT -----   ----- Message -----    From: Arnoldo Lenis, MD    Sent: 12/04/2015   1:47 PM      To: Clayton [name], CMA  Breathing tests show severe COPD, please refer to Dr Arlester Marker MD

## 2015-12-11 NOTE — Telephone Encounter (Signed)
Pt aware, referral placed and routed to pcp

## 2016-02-07 ENCOUNTER — Ambulatory Visit (INDEPENDENT_AMBULATORY_CARE_PROVIDER_SITE_OTHER): Payer: 59 | Admitting: Cardiology

## 2016-02-07 ENCOUNTER — Encounter: Payer: Self-pay | Admitting: Cardiology

## 2016-02-07 VITALS — BP 136/75 | HR 81 | Ht 74.0 in | Wt 220.0 lb

## 2016-02-07 DIAGNOSIS — I10 Essential (primary) hypertension: Secondary | ICD-10-CM

## 2016-02-07 DIAGNOSIS — I2510 Atherosclerotic heart disease of native coronary artery without angina pectoris: Secondary | ICD-10-CM

## 2016-02-07 NOTE — Progress Notes (Signed)
Patient ID: Clayton [name], male   DOB: Sep 13, 1951, 65 y.o.   MRN: [id number]    Clinical Summary Clayton [name] is a 65 y.o.male seen today for follow up of the following medical problems.    1. CAD - hx of PCI in 2009. 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 LVEF at that time by LVgram 55%.  - 05/2015 echo LVEF 55%, mild AI   - no recent chest pain. Occasional SOB at times. 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   2. HTN - mixed compliance with meds  3. COPD - noted on PFTs 11/2015, he was referred to Clayton [name] at that time  - mixed compliance with inhalers    Past Medical History  Diagnosis Date  . HTN (hypertension)   . Diabetes mellitus   . PVD (peripheral vascular disease) ([place])   . Hyperlipidemia   . Myocardial infarction ([place]) 2009  . Prostate cancer ([place])      No Known Allergies   Current Outpatient Prescriptions  Medication Sig Dispense Refill  . albuterol (PROVENTIL HFA;VENTOLIN HFA) 108 (90 BASE) MCG/ACT inhaler Inhale 2 puffs into the lungs every 6 (six) hours as needed for wheezing or shortness of breath. Please instruct in proper technique. 1 Inhaler 1  . aspirin 81 MG tablet Take 81 mg by mouth daily.    . carvedilol (COREG) 25 MG tablet TAKE 1 TABLET (25 MG TOTAL) BY MOUTH 2 (TWO) TIMES DAILY WITH A MEAL. 180 tablet 0  . ferrous sulfate 325 (65 FE) MG tablet Take 325 mg by mouth daily with breakfast.    . fish oil-omega-3 fatty acids 1000 MG capsule Take 2 g by mouth daily.    . Fluticasone-Salmeterol (ADVAIR DISKUS) 250-50 MCG/DOSE AEPB Inhale 1 puff into the lungs 2 (two) times daily as needed.    . furosemide (LASIX) 20 MG tablet Take 2 tablets (40 mg total) by mouth daily. 30 tablet 3  . glipiZIDE (GLUCOTROL) 10 MG tablet Take 10 mg by mouth 2 (two) times daily.    Marland Kitchen lisinopril  (PRINIVIL,ZESTRIL) 5 MG tablet Take 1 tablet (5 mg total) by mouth daily. 90 tablet 3  . metFORMIN (GLUCOPHAGE) 1000 MG tablet Take 1 tablet by mouth 2 (two) times daily.    Marland Kitchen omeprazole (PRILOSEC) 20 MG capsule Take 20 mg by mouth daily as needed.     . pravastatin (PRAVACHOL) 40 MG tablet Take 1 tablet by mouth daily.    . Saw Palmetto 1000 MG CAPS Take 1,000 mg by mouth daily.    . sildenafil (VIAGRA) 50 MG tablet Take 1 tablet (50 mg total) by mouth daily as needed for erectile dysfunction. 10 tablet 6  . tiotropium (SPIRIVA HANDIHALER) 18 MCG inhalation capsule Place 1 capsule (18 mcg total) into inhaler and inhale daily. Please instruct in proper technique. 30 capsule 0   No current facility-administered medications for this visit.     Past Surgical History  Procedure Laterality Date  . Cardiac stents  2009  . Cataract extraction w/phaco Left 08/19/2013    Procedure: CATARACT EXTRACTION PHACO AND INTRAOCULAR LENS PLACEMENT (IOC);  Surgeon: Clayton Victoriah Wilds, MD;  Location: [facility];  Service: Ophthalmology;  Laterality: Left;  CDE:37.77     No Known Allergies    Family History  Problem Relation Age of Onset  . Coronary artery disease Mother   .  Coronary artery disease Father   . Prostate cancer Father      Social History Clayton [name] reports that he has been smoking Cigarettes.  He started smoking about 46 years ago. He has a 15 pack-year smoking history. He has never used smokeless tobacco. Clayton [name] reports that he does not drink alcohol.   Review of Systems CONSTITUTIONAL: No weight loss, fever, chills, weakness or fatigue.  HEENT: Eyes: No visual loss, blurred vision, double vision or yellow sclerae.No hearing loss, sneezing, congestion, runny nose or sore throat.  SKIN: No rash or itching.  CARDIOVASCULAR: per HPI RESPIRATORY: No shortness of breath, cough or sputum.  GASTROINTESTINAL: No anorexia, nausea, vomiting or diarrhea. No abdominal pain or blood.  GENITOURINARY: No  burning on urination, no polyuria NEUROLOGICAL: No headache, dizziness, syncope, paralysis, ataxia, numbness or tingling in the extremities. No change in bowel or bladder control.  MUSCULOSKELETAL: No muscle, back pain, joint pain or stiffness.  LYMPHATICS: No enlarged nodes. No history of splenectomy.  PSYCHIATRIC: No history of depression or anxiety.  ENDOCRINOLOGIC: No reports of sweating, cold or heat intolerance. No polyuria or polydipsia.  Marland Kitchen   Physical Examination Filed Vitals:   02/07/16 1102  BP: 136/75  Pulse: 81   Filed Vitals:   02/07/16 1102  Height: 6\' 2"  (1.88 m)  Weight: 220 lb (99.791 kg)    Gen: resting comfortably, no acute distress HEENT: no scleral icterus, pupils equal round and reactive, no palptable cervical adenopathy,  CV: RRR, no m/r/g,l no jvd Resp: bilateral wheezing GI: abdomen is soft, non-tender, non-distended, normal bowel sounds, no hepatosplenomegaly MSK: extremities are warm, no edema.  Skin: warm, no rash Neuro:  no focal deficits Psych: appropriate affect   Diagnostic Studies  11/2007 Cath HEMODYNAMIC RESULTS: Aorta 95/83 mmHg. Left ventricle 94/22 mmHg.  ANGIOGRAPHIC FINDINGS: 1. Left main coronary artery is free of significant flow-limiting  coronary atherosclerosis and gives rise to the left anterior  descending and circumflex vessels. 2. Left anterior descending is a medium-caliber vessel that extends  down to the apex. There are 3 diagonal branches. Within the  proximal portion of the vessel distal to the first diagonal Clayton [name]  is an area of 40% to 50% stenosis followed by an area of 30%  stenosis in the midvessel. In the distal vessel there is an area  of 30% diffuse stenosis. Flow was TIMI-3 in this vessel. 3. The circumflex vessel is medium in caliber. There are 4 obtuse  marginal branches. The first Clayton [name] is the largest. Within this  Clayton [name] is an area of relatively focal  60% to 70% stenosis  surrounded by an area of approximately 50% to 60% stenosis in  diffuse fashion. Distal to this is an area of 50% more focal  stenosis. 4. Otherwise, there are relatively mild luminal irregularities to  approximately 20% in the circumflex vessel and an area of 30%  stenosis within the fourth obtuse marginal Moe Graca. 5. Right coronary artery is occluded in the proximal to midvessel  segment. There are faint left-to-right collaterals that fill a  portion of the distal vessel, although the remainder of the vessel  is not well seen.  LEFT VENTRICULOGRAPHY: Performed in the RAO projection and reveals an ejection fraction of approximately 55% with mid to basal inferior akinesis and trace mitral regurgitation.  DIAGNOSES: 1. Coronary atherosclerosis as outlined including an occluded proximal  to mid right coronary artery associated with faint left-to-right  collateral filling of the distal vessel. There is otherwise  moderate left  system disease including 60% to 70% stenosis  involving the obtuse marginal and 40% to 50% stenosis in the  proximal left anterior descending. 2. Left ventricular ejection fraction of approximately 55% with mid to  basal inferior akinesis, trace mitral regurgitation and left  ventricular end-diastolic pressure of 22 mmHg.  DISCUSSION: I reviewed the results with the patient and also discussed the films with Clayton. Lia Foyer. At this point, I anticipate percutaneous intervention to treat the right coronary artery and otherwise medical therapy.   05/2015 echo  Study Conclusions  - Left ventricle: Technically dificiult study. 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 The cavity size was mildly dilated. Wall thickness was increased in a pattern of mild LVH. The estimated ejection fraction was 55%. - Aortic valve: Sclerosis without stenosis. There was  mild regurgitation. - Right ventricle: The cavity size was normal. Systolic function was normal.  11/2015 PFTs Severe COPD   Assessment and Plan    1. CAD - no recent chest pain, we will continue current meds  2. HTN - at goal, continue current meds  3. COPD - encouraged increased compliance with inhalers    F/u 41months    Arnoldo Lenis, M.D.

## 2016-02-07 NOTE — Patient Instructions (Signed)
Your physician wants you to follow-up in: December 2017 You will receive a reminder letter in the mail two months in advance. If you don't receive a letter, please call our office to schedule the follow-up appointment.  Your physician recommends that you continue on your current medications as directed. Please refer to the Current Medication list given to you today.  Thank you for choosing [name]!!

## 2016-03-18 ENCOUNTER — Other Ambulatory Visit (HOSPITAL_COMMUNITY): Payer: Self-pay | Admitting: Adult Health Nurse Practitioner

## 2016-03-18 DIAGNOSIS — R911 Solitary pulmonary nodule: Secondary | ICD-10-CM

## 2016-04-09 ENCOUNTER — Ambulatory Visit (HOSPITAL_COMMUNITY): Payer: 59

## 2016-07-03 NOTE — Patient Instructions (Signed)
Your procedure is scheduled on: 07/11/2016  Report to [facility] at   640   AM.  Call this number if you have problems the morning of surgery: [phone]   Do not eat food or drink liquids :After Midnight.      Take these medicines the morning of surgery with A SIP OF WATER: norvasc, coreg, prilosec.   Do not wear jewelry, make-up or nail polish.  Do not wear lotions, powders, or perfumes. You may wear deodorant.  Do not shave 48 hours prior to surgery.  Do not bring valuables to the hospital.  Contacts, dentures or bridgework may not be worn into surgery.  Leave suitcase in the car. After surgery it may be brought to your room.  For patients admitted to the hospital, checkout time is 11:00 AM the day of discharge.   Patients discharged the day of surgery will not be allowed to drive home.  :     Please read over the following fact sheets that you were given: Coughing and Deep Breathing, Surgical Site Infection Prevention, Anesthesia Post-op Instructions and Care and Recovery After Surgery    Cataract A cataract is a clouding of the lens of the eye. When a lens becomes cloudy, vision is reduced based on the degree and nature of the clouding. Many cataracts reduce vision to some degree. Some cataracts make people more near-sighted as they develop. Other cataracts increase glare. Cataracts that are ignored and become worse can sometimes look white. The white color can be seen through the pupil. CAUSES   Aging. However, cataracts may occur at any age, even in newborns.   Certain drugs.   Trauma to the eye.   Certain diseases such as diabetes.   Specific eye diseases such as chronic inflammation inside the eye or a sudden attack of a rare form of glaucoma.   Inherited or acquired medical problems.  SYMPTOMS   Gradual, progressive drop in vision in the affected eye.   Severe, rapid visual loss. This most often happens when trauma is the cause.  DIAGNOSIS  To detect a cataract,  an eye doctor examines the lens. Cataracts are best diagnosed with an exam of the eyes with the pupils enlarged (dilated) by drops.  TREATMENT  For an early cataract, vision may improve by using different eyeglasses or stronger lighting. If that does not help your vision, surgery is the only effective treatment. A cataract needs to be surgically removed when vision loss interferes with your everyday activities, such as driving, reading, or watching TV. A cataract may also have to be removed if it prevents examination or treatment of another eye problem. Surgery removes the cloudy lens and usually replaces it with a substitute lens (intraocular lens, IOL).  At a time when both you and your doctor agree, the cataract will be surgically removed. If you have cataracts in both eyes, only one is usually removed at a time. This allows the operated eye to heal and be out of danger from any possible problems after surgery (such as infection or poor wound healing). In rare cases, a cataract may be doing damage to your eye. In these cases, your caregiver may advise surgical removal right away. The vast majority of people who have cataract surgery have better vision afterward. HOME CARE INSTRUCTIONS  If you are not planning surgery, you may be asked to do the following:  Use different eyeglasses.   Use stronger or brighter lighting.   Ask your eye doctor about reducing  your medicine dose or changing medicines if it is thought that a medicine caused your cataract. Changing medicines does not make the cataract go away on its own.   Become familiar with your surroundings. Poor vision can lead to injury. Avoid bumping into things on the affected side. You are at a higher risk for tripping or falling.   Exercise extreme care when driving or operating machinery.   Wear sunglasses if you are sensitive to bright light or experiencing problems with glare.  SEEK IMMEDIATE MEDICAL CARE IF:   You have a worsening or  sudden vision loss.   You notice redness, swelling, or increasing pain in the eye.   You have a fever.  Document Released: 09/02/2005 Document Revised: 08/22/2011 Document Reviewed: 04/26/2011 [facility] Patient Information 2012 [place].PATIENT INSTRUCTIONS POST-ANESTHESIA  IMMEDIATELY FOLLOWING SURGERY:  Do not drive or operate machinery for the first twenty four hours after surgery.  Do not make any important decisions for twenty four hours after surgery or while taking narcotic pain medications or sedatives.  If you develop intractable nausea and vomiting or a severe headache please notify your doctor immediately.  FOLLOW-UP:  Please make an appointment with your surgeon as instructed. You do not need to follow up with anesthesia unless specifically instructed to do so.  WOUND CARE INSTRUCTIONS (if applicable):  Keep a dry clean dressing on the anesthesia/puncture wound site if there is drainage.  Once the wound has quit draining you may leave it open to air.  Generally you should leave the bandage intact for twenty four hours unless there is drainage.  If the epidural site drains for more than 36-48 hours please call the anesthesia department.  QUESTIONS?:  Please feel free to call your physician or the hospital operator if you have any questions, and they will be happy to assist you.

## 2016-07-04 ENCOUNTER — Encounter (HOSPITAL_COMMUNITY)
Admission: RE | Admit: 2016-07-04 | Discharge: 2016-07-04 | Disposition: A | Discharge status: Home / Self Care | Payer: 59 | Source: Ambulatory Visit | Attending: Ophthalmology | Admitting: Ophthalmology

## 2016-07-05 ENCOUNTER — Encounter (HOSPITAL_COMMUNITY): Admission: RE | Admit: 2016-07-05 | Payer: 59 | Source: Ambulatory Visit

## 2016-07-08 ENCOUNTER — Encounter (HOSPITAL_COMMUNITY): Payer: Self-pay

## 2016-07-08 ENCOUNTER — Encounter (HOSPITAL_COMMUNITY)
Admission: RE | Admit: 2016-07-08 | Discharge: 2016-07-08 | Disposition: A | Discharge status: Home / Self Care | Payer: 59 | Source: Ambulatory Visit | Attending: Ophthalmology | Admitting: Ophthalmology

## 2016-07-08 DIAGNOSIS — H269 Unspecified cataract: Secondary | ICD-10-CM | POA: Diagnosis not present

## 2016-07-08 DIAGNOSIS — Z01812 Encounter for preprocedural laboratory examination: Secondary | ICD-10-CM | POA: Diagnosis present

## 2016-07-08 HISTORY — DX: Chronic obstructive pulmonary disease, unspecified: J44.9

## 2016-07-08 HISTORY — DX: Gastro-esophageal reflux disease without esophagitis: K21.9

## 2016-07-08 LAB — CBC WITH DIFFERENTIAL/PLATELET
BASOS ABS: 0 10*3/uL (ref 0.0–0.1)
Basophils Relative: 0 %
Eosinophils Absolute: 0.1 10*3/uL (ref 0.0–0.7)
Eosinophils Relative: 1 %
HCT: 41 % (ref 39.0–52.0)
HEMOGLOBIN: 13.6 g/dL (ref 13.0–17.0)
LYMPHS ABS: 1.9 10*3/uL (ref 0.7–4.0)
LYMPHS PCT: 27 %
MCH: 31.9 pg (ref 26.0–34.0)
MCHC: 33.2 g/dL (ref 30.0–36.0)
MCV: 96.2 fL (ref 78.0–100.0)
Monocytes Absolute: 0.7 10*3/uL (ref 0.1–1.0)
Monocytes Relative: 9 %
NEUTROS ABS: 4.5 10*3/uL (ref 1.7–7.7)
NEUTROS PCT: 63 %
Platelets: 205 10*3/uL (ref 150–400)
RBC: 4.26 MIL/uL (ref 4.22–5.81)
RDW: 12.1 % (ref 11.5–15.5)
WBC: 7.1 10*3/uL (ref 4.0–10.5)

## 2016-07-08 LAB — BASIC METABOLIC PANEL
Anion gap: 7 (ref 5–15)
BUN: 13 mg/dL (ref 6–20)
CALCIUM: 9.2 mg/dL (ref 8.9–10.3)
CO2: 29 mmol/L (ref 22–32)
CREATININE: 0.87 mg/dL (ref 0.61–1.24)
Chloride: 105 mmol/L (ref 101–111)
GFR calc Af Amer: >60 mL/min (ref >60)
GLUCOSE: 173 mg/dL — AB (ref 65–99)
Potassium: 4.1 mmol/L (ref 3.5–5.1)
Sodium: 141 mmol/L (ref 135–145)

## 2016-07-22 NOTE — Patient Instructions (Addendum)
Clayton [name]  07/22/2016     @PREFPERIOPPHARMACY @   Your procedure is scheduled on 07/29/2016.  Report to [facility] at 0730 A.M.  Call this number if you have problems the morning of surgery:  [phone]   Remember:  Do not eat food or drink liquids after midnight.  Take these medicines the morning of surgery with A SIP OF WATER : NORVASC, COREG, PRILOSEC AND ACCUPRIL.  PLEASE USE YOUR ALBUTEROL INHALERS BEFORE COMING TO [place] AND BRING IT WITH YOU.   Do not wear jewelry, make-up or nail polish.  Do not wear lotions, powders, or perfumes, or deoderant.  Do not shave 48 hours prior to surgery.  Men may shave face and neck.  Do not bring valuables to the hospital.  [facility] is not responsible for any belongings or valuables.  Contacts, dentures or bridgework may not be worn into surgery.  Leave your suitcase in the car.  After surgery it may be brought to your room.  For patients admitted to the hospital, discharge time will be determined by your treatment team.  Patients discharged the day of surgery will not be allowed to drive home.   Name and phone number of your driver:   FAMILY Special instructions:  N/A  Please read over the following fact sheets that you were given. Care and Recovery After Surgery   PATIENT INSTRUCTIONS POST-ANESTHESIA  IMMEDIATELY FOLLOWING SURGERY:  Do not drive or operate machinery for the first twenty four hours after surgery.  Do not make any important decisions for twenty four hours after surgery or while taking narcotic pain medications or sedatives.  If you develop intractable nausea and vomiting or a severe headache please notify your doctor immediately.  FOLLOW-UP:  Please make an appointment with your surgeon as instructed. You do not need to follow up with anesthesia unless specifically instructed to do so.  WOUND CARE INSTRUCTIONS (if applicable):  Keep a dry clean dressing on the anesthesia/puncture wound site if there is  drainage.  Once the wound has quit draining you may leave it open to air.  Generally you should leave the bandage intact for twenty four hours unless there is drainage.  If the epidural site drains for more than 36-48 hours please call the anesthesia department.  QUESTIONS?:  Please feel free to call your physician or the hospital operator if you have any questions, and they will be happy to assist you.        A cataract is a clouding of the lens of the eye. When a lens becomes cloudy, vision is reduced based on the degree and nature of the clouding. Surgery may be needed to improve vision. Surgery removes the cloudy lens and usually replaces it with a substitute lens (intraocular lens, IOL). LET YOUR EYE DOCTOR KNOW ABOUT:  Allergies to food or medicine.  Medicines taken including herbs, eye drops, over-the-counter medicines, and creams.  Use of steroids (by mouth or creams).  Previous problems with anesthetics or numbing medicine.  History of bleeding problems or blood clots.  Previous surgery.  Other health problems, including diabetes and kidney problems.  Possibility of pregnancy, if this applies. RISKS AND COMPLICATIONS  Infection.  Inflammation of the eyeball (endophthalmitis) that can spread to both eyes (sympathetic ophthalmia).  Poor wound healing.  If an IOL is inserted, it can later fall out of proper position. This is very uncommon.  Clouding of the part of your eye that holds an IOL in place. This is  called an "after-cataract." These are uncommon but easily treated. BEFORE THE PROCEDURE  Do not eat or drink anything except small amounts of water for 8 to 12 before your surgery, or as directed by your caregiver.  Unless you are told otherwise, continue any eye drops you have been prescribed.  Talk to your primary caregiver about all other medicines that you take (both prescription and nonprescription). In some cases, you may need to stop or change medicines near  the time of your surgery. This is most important if you are taking blood-thinning medicine.Do not stop medicines unless you are told to do so.  Arrange for someone to drive you to and from the procedure.  Do not put contact lenses in either eye on the day of your surgery. PROCEDURE There is more than one method for safely removing a cataract. Your doctor can explain the differences and help determine which is best for you. Phacoemulsification surgery is the most common form of cataract surgery.  An injection is given behind the eye or eye drops are given to make this a painless procedure.  A small cut (incision) is made on the edge of the clear, dome-shaped surface that covers the front of the eye (cornea).  A tiny probe is painlessly inserted into the eye. This device gives off ultrasound waves that soften and break up the cloudy center of the lens. This makes it easier for the cloudy lens to be removed by suction.  An IOL may be implanted.  The normal lens of the eye is covered by a clear capsule. Part of that capsule is intentionally left in the eye to support the IOL.  Your surgeon may or may not use stitches to close the incision. There are other forms of cataract surgery that require a larger incision and stitches to close the eye. This approach is taken in cases where the doctor feels that the cataract cannot be easily removed using phacoemulsification. AFTER THE PROCEDURE  When an IOL is implanted, it does not need care. It becomes a permanent part of your eye and cannot be seen or felt.  Your doctor will schedule follow-up exams to check on your progress.  Review your other medicines with your doctor to see which can be resumed after surgery.  Use eye drops or take medicine as prescribed by your doctor.   This information is not intended to replace advice given to you by your health care provider. Make sure you discuss any questions you have with your health care provider.     Document Released: 08/22/2011 Document Revised: 09/23/2014 Document Reviewed: 08/22/2011 Elsevier Interactive Patient Education [employer].

## 2016-07-24 ENCOUNTER — Encounter (HOSPITAL_COMMUNITY)
Admission: RE | Admit: 2016-07-24 | Discharge: 2016-07-24 | Disposition: A | Discharge status: Home / Self Care | Payer: 59 | Source: Ambulatory Visit | Attending: Ophthalmology | Admitting: Ophthalmology

## 2016-07-29 ENCOUNTER — Ambulatory Visit (HOSPITAL_COMMUNITY): Payer: 59 | Admitting: Anesthesiology

## 2016-07-29 ENCOUNTER — Ambulatory Visit (HOSPITAL_COMMUNITY)
Admission: RE | Admit: 2016-07-29 | Discharge: 2016-07-29 | Disposition: A | Discharge status: Home / Self Care | Payer: 59 | Source: Ambulatory Visit | Attending: Ophthalmology | Admitting: Ophthalmology

## 2016-07-29 ENCOUNTER — Encounter (HOSPITAL_COMMUNITY): Payer: Self-pay | Admitting: *Deleted

## 2016-07-29 ENCOUNTER — Encounter (HOSPITAL_COMMUNITY)
Admission: RE | Disposition: A | Discharge status: Home / Self Care | Payer: Self-pay | Source: Ambulatory Visit | Attending: Ophthalmology

## 2016-07-29 DIAGNOSIS — H2181 Floppy iris syndrome: Secondary | ICD-10-CM | POA: Insufficient documentation

## 2016-07-29 DIAGNOSIS — J449 Chronic obstructive pulmonary disease, unspecified: Secondary | ICD-10-CM | POA: Diagnosis not present

## 2016-07-29 DIAGNOSIS — I10 Essential (primary) hypertension: Secondary | ICD-10-CM | POA: Diagnosis not present

## 2016-07-29 DIAGNOSIS — I2510 Atherosclerotic heart disease of native coronary artery without angina pectoris: Secondary | ICD-10-CM | POA: Insufficient documentation

## 2016-07-29 DIAGNOSIS — I252 Old myocardial infarction: Secondary | ICD-10-CM | POA: Insufficient documentation

## 2016-07-29 DIAGNOSIS — Z79899 Other long term (current) drug therapy: Secondary | ICD-10-CM | POA: Insufficient documentation

## 2016-07-29 DIAGNOSIS — Z7984 Long term (current) use of oral hypoglycemic drugs: Secondary | ICD-10-CM | POA: Diagnosis not present

## 2016-07-29 DIAGNOSIS — F17200 Nicotine dependence, unspecified, uncomplicated: Secondary | ICD-10-CM | POA: Diagnosis not present

## 2016-07-29 DIAGNOSIS — E1136 Type 2 diabetes mellitus with diabetic cataract: Secondary | ICD-10-CM | POA: Diagnosis present

## 2016-07-29 DIAGNOSIS — Z955 Presence of coronary angioplasty implant and graft: Secondary | ICD-10-CM | POA: Diagnosis not present

## 2016-07-29 DIAGNOSIS — I739 Peripheral vascular disease, unspecified: Secondary | ICD-10-CM | POA: Insufficient documentation

## 2016-07-29 DIAGNOSIS — K219 Gastro-esophageal reflux disease without esophagitis: Secondary | ICD-10-CM | POA: Insufficient documentation

## 2016-07-29 HISTORY — PX: CATARACT EXTRACTION W/PHACO: SHX586

## 2016-07-29 LAB — GLUCOSE, CAPILLARY: GLUCOSE-CAPILLARY: 170 mg/dL — AB (ref 65–99)

## 2016-07-29 SURGERY — PHACOEMULSIFICATION, CATARACT, WITH IOL INSERTION
Anesthesia: Monitor Anesthesia Care | Site: Eye | Laterality: Right

## 2016-07-29 MED ORDER — BSS IO SOLN
INTRAOCULAR | Status: DC | PRN
  Administered 2016-07-29: 15 mL

## 2016-07-29 MED ORDER — LACTATED RINGERS IV SOLN
INTRAVENOUS | Status: DC
  Administered 2016-07-29: 09:00:00 via INTRAVENOUS

## 2016-07-29 MED ORDER — POVIDONE-IODINE 5 % OP SOLN
OPHTHALMIC | Status: DC | PRN
  Administered 2016-07-29: 1 via OPHTHALMIC

## 2016-07-29 MED ORDER — EPINEPHRINE PF 1 MG/ML IJ SOLN
INTRAOCULAR | Status: DC | PRN
  Administered 2016-07-29: 500 mL

## 2016-07-29 MED ORDER — PHENYLEPHRINE HCL 2.5 % OP SOLN
1.0000 [drp] | OPHTHALMIC | Status: AC
  Administered 2016-07-29 (×3): 1 [drp] via OPHTHALMIC

## 2016-07-29 MED ORDER — LIDOCAINE HCL (PF) 1 % IJ SOLN
INTRAOCULAR | Status: DC | PRN
  Administered 2016-07-29: 1 mL via OPHTHALMIC

## 2016-07-29 MED ORDER — MIDAZOLAM HCL 2 MG/2ML IJ SOLN
1.0000 mg | INTRAMUSCULAR | Status: DC | PRN
  Administered 2016-07-29: 2 mg via INTRAVENOUS

## 2016-07-29 MED ORDER — TRYPAN BLUE 0.06 % OP SOLN
OPHTHALMIC | Status: AC
  Filled 2016-07-29: qty 0.5

## 2016-07-29 MED ORDER — EPINEPHRINE PF 1 MG/ML IJ SOLN
INTRAMUSCULAR | Status: AC
Start: 2016-07-29 — End: 2016-07-29
  Filled 2016-07-29: qty 1

## 2016-07-29 MED ORDER — FENTANYL CITRATE (PF) 100 MCG/2ML IJ SOLN
25.0000 ug | INTRAMUSCULAR | Status: DC | PRN
  Administered 2016-07-29: 25 ug via INTRAVENOUS

## 2016-07-29 MED ORDER — FENTANYL CITRATE (PF) 100 MCG/2ML IJ SOLN
INTRAMUSCULAR | Status: AC
  Filled 2016-07-29: qty 2

## 2016-07-29 MED ORDER — LIDOCAINE 3.5 % OP GEL OPTIME - NO CHARGE
OPHTHALMIC | Status: DC | PRN
  Administered 2016-07-29: 1 [drp] via OPHTHALMIC

## 2016-07-29 MED ORDER — NEOMYCIN-POLYMYXIN-DEXAMETH 3.5-10000-0.1 OP SUSP
OPHTHALMIC | Status: DC | PRN
  Administered 2016-07-29: 2 [drp] via OPHTHALMIC

## 2016-07-29 MED ORDER — NA HYALUR & NA CHOND-NA HYALUR 0.55-0.5 ML IO KIT
PACK | INTRAOCULAR | Status: DC | PRN
  Administered 2016-07-29: 1 via OPHTHALMIC

## 2016-07-29 MED ORDER — MIDAZOLAM HCL 2 MG/2ML IJ SOLN
INTRAMUSCULAR | Status: AC
  Filled 2016-07-29: qty 2

## 2016-07-29 MED ORDER — LIDOCAINE HCL 3.5 % OP GEL
1.0000 "application " | Freq: Once | OPHTHALMIC | Status: AC
  Administered 2016-07-29: 1 via OPHTHALMIC

## 2016-07-29 MED ORDER — CYCLOPENTOLATE-PHENYLEPHRINE 0.2-1 % OP SOLN
1.0000 [drp] | OPHTHALMIC | Status: AC
  Administered 2016-07-29 (×3): 1 [drp] via OPHTHALMIC

## 2016-07-29 MED ORDER — TETRACAINE HCL 0.5 % OP SOLN
1.0000 [drp] | OPHTHALMIC | Status: AC
  Administered 2016-07-29 (×3): 1 [drp] via OPHTHALMIC

## 2016-07-29 SURGICAL SUPPLY — 13 items
CLOTH BEACON ORANGE TIMEOUT ST (SAFETY) ×2 IMPLANT
EYE SHIELD UNIVERSAL CLEAR (GAUZE/BANDAGES/DRESSINGS) ×2 IMPLANT
GLOVE BIOGEL PI IND STRL 6.5 (GLOVE) IMPLANT
GLOVE BIOGEL PI INDICATOR 6.5 (GLOVE) ×4
GLOVE EXAM NITRILE MD LF STRL (GLOVE) ×2 IMPLANT
GOWN STRL REUS W/TWL LRG LVL3 (GOWN DISPOSABLE) ×2 IMPLANT
PAD ARMBOARD 7.5X6 YLW CONV (MISCELLANEOUS) ×2 IMPLANT
RING MALYGIN (MISCELLANEOUS) ×2 IMPLANT
SIGHTPATH CAT PROC W REG LENS (Ophthalmic Related) ×3 IMPLANT
SYRINGE LUER LOK 1CC (MISCELLANEOUS) ×2 IMPLANT
TAPE SURG TRANSPORE 1 IN (GAUZE/BANDAGES/DRESSINGS) IMPLANT
TAPE SURGICAL TRANSPORE 1 IN (GAUZE/BANDAGES/DRESSINGS) ×2
WATER STERILE IRR 250ML POUR (IV SOLUTION) ×2 IMPLANT

## 2016-07-29 NOTE — Transfer of Care (Signed)
Immediate Anesthesia Transfer of Care Note  Patient: Clayton [name]  Procedure(s) Performed: Procedure(s): CATARACT EXTRACTION PHACO AND INTRAOCULAR LENS PLACEMENT RIGHT EYE; CDE:  18.10 (Right)  Patient Location: Short Stay  Anesthesia Type:MAC  Level of Consciousness: awake, alert , oriented and patient cooperative  Airway & Oxygen Therapy: Patient Spontanous Breathing  Post-op Assessment: Report given to RN, Post -op Vital signs reviewed and stable and Patient moving all extremities  Post vital signs: Reviewed and stable  Last Vitals:  Vitals:   07/29/16 0804  BP: 111/68  Pulse: 80  Resp: 20  Temp: 36.4 C    Last Pain:  Vitals:   07/29/16 0804  TempSrc: Oral         Complications: No apparent anesthesia complications

## 2016-07-29 NOTE — Discharge Instructions (Signed)

## 2016-07-29 NOTE — Anesthesia Postprocedure Evaluation (Signed)
Anesthesia Post Note  Patient: Clayton [name]  Procedure(s) Performed: Procedure(s) (LRB): CATARACT EXTRACTION PHACO AND INTRAOCULAR LENS PLACEMENT RIGHT EYE; CDE:  18.10 (Right)  Patient location during evaluation: Short Stay Anesthesia Type: MAC Level of consciousness: awake, oriented and patient cooperative Pain management: pain level controlled Vital Signs Assessment: post-procedure vital signs reviewed and stable Respiratory status: spontaneous breathing, nonlabored ventilation and respiratory function stable Cardiovascular status: blood pressure returned to baseline Postop Assessment: no signs of nausea or vomiting Anesthetic complications: no    Last Vitals:  Vitals:   07/29/16 0804  BP: 111/68  Pulse: 80  Resp: 20  Temp: 36.4 C    Last Pain:  Vitals:   07/29/16 0804  TempSrc: Oral                 Gearld Kerstein J

## 2016-07-29 NOTE — H&P (Signed)
I have reviewed the H&P, the patient was re-examined, and I have identified no interval changes in medical condition and plan of care since the history and physical of record  

## 2016-07-29 NOTE — Op Note (Signed)
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  Assistants: None  Anesthesia: Topical with MAC  Indications: Painless, progressive loss of vision with compromise of daily activities.  Surgery: Cataract Extraction with Intraocular lens Implant Right Eye, CPT Code [id number]  Discription: The patient had dilating drops and viscous lidocaine placed into the left eye in the pre-op holding area. After transfer to the operating room, a time out was performed. The patient was then prepped and draped. Beginning with a 50 degree blade a paracentesis port was made at the surgeon's 2 o'clock position. The anterior chamber was then filled with 1% non-preserved lidocaine with epinepherine. This was followed by filling the anterior chamber with Viscoat. Vision Blue was painted on the anterior capsule to enhance visualization. A Malyugan ring was placed into the anterior chamber using its injector. The loops were positioned with the Kuglan hook. A bent cystatome needle was used to create a continuous tear capsulotomy. Hydrodissection was performed with balanced salt solution on a Fine canula. The lens nucleus was then removed using the phacoemulsification handpiece. Residual cortex was removed with the I&A handpiece. The anterior chamber and capsular bag were refilled with Provisc. A posterior chamber intraocular lens was placed into the capsular bag with it's injector. The implant was positioned with the Kuglan hook. The Malyugan ring was disengaged from the iris margin and removed with its injector. The Provisc was then removed from the anterior chamber and capsular bag with the I&A handpiece. Stromal hydration of the main incision and paracentesis port was performed with BSS on a Fine canula. The wounds were tested for  leak which was negative. The patient tolerated the procedure well. There were no operative complications. The patient was then transferred to the recovery room in stable condition.  Complications: None  Specimen: None  EBL: None  Prosthetic device: Hoya Model 250, power 16.5, SN [id number].

## 2016-07-29 NOTE — Anesthesia Preprocedure Evaluation (Signed)
Anesthesia Evaluation  Patient identified by MRN, date of birth, ID band Patient awake    Reviewed: Allergy & Precautions, H&P , NPO status , Patient's Chart, lab work & pertinent test results, reviewed documented beta blocker date and time   Airway Mallampati: II  TM Distance: >3 FB     Dental  (+) Edentulous Upper, Edentulous Lower   Pulmonary COPD, Current Smoker,    breath sounds clear to auscultation       Cardiovascular hypertension, Pt. on medications and Pt. on home beta blockers + CAD, + Past MI, + Cardiac Stents and + Peripheral Vascular Disease   Rhythm:Regular Rate:Normal     Neuro/Psych    GI/Hepatic GERD  Medicated,  Endo/Other  diabetes, Type 2, Oral Hypoglycemic Agents  Renal/GU      Musculoskeletal   Abdominal   Peds  Hematology   Anesthesia Other Findings   Reproductive/Obstetrics                             Anesthesia Physical Anesthesia Plan  ASA: III  Anesthesia Plan: MAC   Post-op Pain Management:    Induction: Intravenous  Airway Management Planned: Nasal Cannula  Additional Equipment:   Intra-op Plan:   Post-operative Plan:   Informed Consent: I have reviewed the patients History and Physical, chart, labs and discussed the procedure including the risks, benefits and alternatives for the proposed anesthesia with the patient or authorized representative who has indicated his/her understanding and acceptance.     Plan Discussed with:   Anesthesia Plan Comments:         Anesthesia Quick Evaluation

## 2016-07-31 ENCOUNTER — Encounter (HOSPITAL_COMMUNITY): Payer: Self-pay | Admitting: Ophthalmology

## 2017-05-09 ENCOUNTER — Ambulatory Visit: Payer: 59 | Admitting: Cardiology

## 2017-05-13 ENCOUNTER — Emergency Department (HOSPITAL_COMMUNITY): Payer: 59

## 2017-05-13 ENCOUNTER — Inpatient Hospital Stay (HOSPITAL_COMMUNITY)
Admission: EM | Admit: 2017-05-13 | Discharge: 2017-05-17 | DRG: 190 | Disposition: A | Discharge status: Home / Self Care | Payer: 59 | Attending: Internal Medicine | Admitting: Internal Medicine

## 2017-05-13 ENCOUNTER — Encounter (HOSPITAL_COMMUNITY): Payer: Self-pay | Admitting: *Deleted

## 2017-05-13 DIAGNOSIS — R7989 Other specified abnormal findings of blood chemistry: Secondary | ICD-10-CM | POA: Diagnosis present

## 2017-05-13 DIAGNOSIS — J9621 Acute and chronic respiratory failure with hypoxia: Secondary | ICD-10-CM | POA: Diagnosis present

## 2017-05-13 DIAGNOSIS — I252 Old myocardial infarction: Secondary | ICD-10-CM | POA: Diagnosis not present

## 2017-05-13 DIAGNOSIS — I10 Essential (primary) hypertension: Secondary | ICD-10-CM | POA: Diagnosis present

## 2017-05-13 DIAGNOSIS — R778 Other specified abnormalities of plasma proteins: Secondary | ICD-10-CM | POA: Diagnosis present

## 2017-05-13 DIAGNOSIS — I2510 Atherosclerotic heart disease of native coronary artery without angina pectoris: Secondary | ICD-10-CM | POA: Diagnosis present

## 2017-05-13 DIAGNOSIS — K219 Gastro-esophageal reflux disease without esophagitis: Secondary | ICD-10-CM | POA: Diagnosis present

## 2017-05-13 DIAGNOSIS — E1151 Type 2 diabetes mellitus with diabetic peripheral angiopathy without gangrene: Secondary | ICD-10-CM | POA: Diagnosis present

## 2017-05-13 DIAGNOSIS — F17200 Nicotine dependence, unspecified, uncomplicated: Secondary | ICD-10-CM | POA: Diagnosis not present

## 2017-05-13 DIAGNOSIS — Z955 Presence of coronary angioplasty implant and graft: Secondary | ICD-10-CM | POA: Diagnosis not present

## 2017-05-13 DIAGNOSIS — Z8546 Personal history of malignant neoplasm of prostate: Secondary | ICD-10-CM

## 2017-05-13 DIAGNOSIS — Z7951 Long term (current) use of inhaled steroids: Secondary | ICD-10-CM | POA: Diagnosis not present

## 2017-05-13 DIAGNOSIS — E785 Hyperlipidemia, unspecified: Secondary | ICD-10-CM | POA: Diagnosis present

## 2017-05-13 DIAGNOSIS — R748 Abnormal levels of other serum enzymes: Secondary | ICD-10-CM | POA: Diagnosis not present

## 2017-05-13 DIAGNOSIS — F17210 Nicotine dependence, cigarettes, uncomplicated: Secondary | ICD-10-CM | POA: Diagnosis present

## 2017-05-13 DIAGNOSIS — J441 Chronic obstructive pulmonary disease with (acute) exacerbation: Secondary | ICD-10-CM | POA: Diagnosis present

## 2017-05-13 DIAGNOSIS — Z8249 Family history of ischemic heart disease and other diseases of the circulatory system: Secondary | ICD-10-CM

## 2017-05-13 DIAGNOSIS — Z23 Encounter for immunization: Secondary | ICD-10-CM | POA: Diagnosis not present

## 2017-05-13 DIAGNOSIS — Z9981 Dependence on supplemental oxygen: Secondary | ICD-10-CM

## 2017-05-13 DIAGNOSIS — T380X5A Adverse effect of glucocorticoids and synthetic analogues, initial encounter: Secondary | ICD-10-CM | POA: Diagnosis not present

## 2017-05-13 DIAGNOSIS — E119 Type 2 diabetes mellitus without complications: Secondary | ICD-10-CM

## 2017-05-13 LAB — CBC WITH DIFFERENTIAL/PLATELET
Basophils Absolute: 0 10*3/uL (ref 0.0–0.1)
Basophils Relative: 0 %
Eosinophils Absolute: 0 10*3/uL (ref 0.0–0.7)
Eosinophils Relative: 1 %
HEMATOCRIT: 42 % (ref 39.0–52.0)
HEMOGLOBIN: 13.5 g/dL (ref 13.0–17.0)
LYMPHS ABS: 0.4 10*3/uL — AB (ref 0.7–4.0)
LYMPHS PCT: 8 %
MCH: 30.3 pg (ref 26.0–34.0)
MCHC: 32.1 g/dL (ref 30.0–36.0)
MCV: 94.2 fL (ref 78.0–100.0)
Monocytes Absolute: 0.8 10*3/uL (ref 0.1–1.0)
Monocytes Relative: 15 %
NEUTROS ABS: 3.9 10*3/uL (ref 1.7–7.7)
Neutrophils Relative %: 76 %
Platelets: 172 10*3/uL (ref 150–400)
RBC: 4.46 MIL/uL (ref 4.22–5.81)
RDW: 13.5 % (ref 11.5–15.5)
WBC: 5.1 10*3/uL (ref 4.0–10.5)

## 2017-05-13 LAB — COMPREHENSIVE METABOLIC PANEL
ALT: 25 U/L (ref 17–63)
AST: 25 U/L (ref 15–41)
Albumin: 4.1 g/dL (ref 3.5–5.0)
Alkaline Phosphatase: 109 U/L (ref 38–126)
Anion gap: 5 (ref 5–15)
BILIRUBIN TOTAL: 1.6 mg/dL — AB (ref 0.3–1.2)
BUN: 9 mg/dL (ref 6–20)
CO2: 32 mmol/L (ref 22–32)
Calcium: 8.9 mg/dL (ref 8.9–10.3)
Chloride: 100 mmol/L — ABNORMAL LOW (ref 101–111)
Creatinine, Ser: 0.63 mg/dL (ref 0.61–1.24)
GFR calc Af Amer: >60 mL/min (ref >60)
Glucose, Bld: 171 mg/dL — ABNORMAL HIGH (ref 65–99)
POTASSIUM: 4.2 mmol/L (ref 3.5–5.1)
Sodium: 137 mmol/L (ref 135–145)
TOTAL PROTEIN: 7.7 g/dL (ref 6.5–8.1)

## 2017-05-13 LAB — I-STAT CG4 LACTIC ACID, ED: LACTIC ACID, VENOUS: 0.7 mmol/L (ref 0.5–1.9)

## 2017-05-13 LAB — BRAIN NATRIURETIC PEPTIDE: B Natriuretic Peptide: 337 pg/mL — ABNORMAL HIGH (ref 0.0–100.0)

## 2017-05-13 LAB — TROPONIN I
Troponin I: 0.03 ng/mL (ref <0.03)
Troponin I: 0.03 ng/mL (ref <0.03)

## 2017-05-13 MED ORDER — NICOTINE 14 MG/24HR TD PT24
14.0000 mg | MEDICATED_PATCH | Freq: Every day | TRANSDERMAL | Status: DC
  Administered 2017-05-13 – 2017-05-15 (×3): 14 mg via TRANSDERMAL
  Filled 2017-05-13 (×5): qty 1

## 2017-05-13 MED ORDER — ALBUTEROL SULFATE (2.5 MG/3ML) 0.083% IN NEBU
5.0000 mg | INHALATION_SOLUTION | Freq: Once | RESPIRATORY_TRACT | Status: AC
  Administered 2017-05-13: 5 mg via RESPIRATORY_TRACT
  Filled 2017-05-13: qty 6

## 2017-05-13 MED ORDER — ASPIRIN EC 81 MG PO TBEC
81.0000 mg | DELAYED_RELEASE_TABLET | Freq: Every day | ORAL | Status: DC
  Administered 2017-05-14 – 2017-05-17 (×4): 81 mg via ORAL
  Filled 2017-05-13 (×4): qty 1

## 2017-05-13 MED ORDER — DOCUSATE SODIUM 100 MG PO CAPS
100.0000 mg | ORAL_CAPSULE | Freq: Two times a day (BID) | ORAL | Status: DC
  Administered 2017-05-13 – 2017-05-16 (×7): 100 mg via ORAL
  Filled 2017-05-13 (×8): qty 1

## 2017-05-13 MED ORDER — ACETAMINOPHEN 500 MG PO TABS
1000.0000 mg | ORAL_TABLET | Freq: Once | ORAL | Status: AC
  Administered 2017-05-13: 1000 mg via ORAL
  Filled 2017-05-13: qty 2

## 2017-05-13 MED ORDER — IPRATROPIUM-ALBUTEROL 0.5-2.5 (3) MG/3ML IN SOLN
3.0000 mL | Freq: Four times a day (QID) | RESPIRATORY_TRACT | Status: DC
  Administered 2017-05-13: 3 mL via RESPIRATORY_TRACT
  Filled 2017-05-13: qty 3

## 2017-05-13 MED ORDER — ONDANSETRON HCL 4 MG PO TABS: 4.0000 mg | ORAL_TABLET | Freq: Four times a day (QID) | ORAL | Status: DC | PRN

## 2017-05-13 MED ORDER — CARVEDILOL 3.125 MG PO TABS
6.2500 mg | ORAL_TABLET | Freq: Two times a day (BID) | ORAL | Status: DC
  Administered 2017-05-13 – 2017-05-17 (×8): 6.25 mg via ORAL
  Filled 2017-05-13 (×8): qty 2

## 2017-05-13 MED ORDER — ASPIRIN 81 MG PO TABS: 81.0000 mg | ORAL_TABLET | Freq: Every day | ORAL | Status: DC

## 2017-05-13 MED ORDER — ENOXAPARIN SODIUM 40 MG/0.4ML ~~LOC~~ SOLN
40.0000 mg | SUBCUTANEOUS | Status: DC
  Administered 2017-05-13 – 2017-05-16 (×4): 40 mg via SUBCUTANEOUS
  Filled 2017-05-13 (×4): qty 0.4

## 2017-05-13 MED ORDER — DEXTROSE 5 % IV SOLN
100.0000 mg | Freq: Two times a day (BID) | INTRAVENOUS | Status: DC
  Administered 2017-05-14 – 2017-05-16 (×7): 100 mg via INTRAVENOUS
  Filled 2017-05-13 (×12): qty 100

## 2017-05-13 MED ORDER — SODIUM CHLORIDE 0.9 % IV BOLUS (SEPSIS)
1000.0000 mL | Freq: Once | INTRAVENOUS | Status: AC
  Administered 2017-05-13: 1000 mL via INTRAVENOUS

## 2017-05-13 MED ORDER — QUINAPRIL HCL 10 MG PO TABS: 40.0000 mg | ORAL_TABLET | Freq: Every day | ORAL | Status: DC

## 2017-05-13 MED ORDER — ACETAMINOPHEN 650 MG RE SUPP: 650.0000 mg | Freq: Four times a day (QID) | RECTAL | Status: DC | PRN

## 2017-05-13 MED ORDER — ACETAMINOPHEN 325 MG PO TABS: 650.0000 mg | ORAL_TABLET | Freq: Four times a day (QID) | ORAL | Status: DC | PRN

## 2017-05-13 MED ORDER — PRAVASTATIN SODIUM 40 MG PO TABS
40.0000 mg | ORAL_TABLET | Freq: Every evening | ORAL | Status: DC
  Administered 2017-05-14 – 2017-05-16 (×3): 40 mg via ORAL
  Filled 2017-05-13 (×3): qty 1

## 2017-05-13 MED ORDER — DOXYCYCLINE HYCLATE 100 MG IV SOLR
INTRAVENOUS | Status: AC
  Filled 2017-05-13: qty 100

## 2017-05-13 MED ORDER — LISINOPRIL 10 MG PO TABS
40.0000 mg | ORAL_TABLET | Freq: Every day | ORAL | Status: DC
  Administered 2017-05-13 – 2017-05-16 (×4): 40 mg via ORAL
  Filled 2017-05-13 (×4): qty 4

## 2017-05-13 MED ORDER — ONDANSETRON HCL 4 MG/2ML IJ SOLN: 4.0000 mg | Freq: Four times a day (QID) | INTRAMUSCULAR | Status: DC | PRN

## 2017-05-13 MED ORDER — METHYLPREDNISOLONE SODIUM SUCC 125 MG IJ SOLR
125.0000 mg | Freq: Once | INTRAMUSCULAR | Status: AC
  Administered 2017-05-13: 125 mg via INTRAVENOUS
  Filled 2017-05-13: qty 2

## 2017-05-13 MED ORDER — LEVOFLOXACIN IN D5W 750 MG/150ML IV SOLN
750.0000 mg | Freq: Once | INTRAVENOUS | Status: AC
  Administered 2017-05-13: 750 mg via INTRAVENOUS
  Filled 2017-05-13: qty 150

## 2017-05-13 MED ORDER — ALBUTEROL SULFATE (2.5 MG/3ML) 0.083% IN NEBU: 2.5000 mg | INHALATION_SOLUTION | RESPIRATORY_TRACT | Status: DC | PRN

## 2017-05-13 MED ORDER — INSULIN ASPART 100 UNIT/ML ~~LOC~~ SOLN
0.0000 [IU] | Freq: Three times a day (TID) | SUBCUTANEOUS | Status: DC
  Administered 2017-05-14 (×2): 5 [IU] via SUBCUTANEOUS
  Administered 2017-05-14: 8 [IU] via SUBCUTANEOUS
  Administered 2017-05-15: 5 [IU] via SUBCUTANEOUS
  Administered 2017-05-15 – 2017-05-16 (×4): 8 [IU] via SUBCUTANEOUS
  Administered 2017-05-16: 5 [IU] via SUBCUTANEOUS
  Administered 2017-05-17: 8 [IU] via SUBCUTANEOUS

## 2017-05-13 MED ORDER — METHYLPREDNISOLONE SODIUM SUCC 125 MG IJ SOLR
80.0000 mg | Freq: Two times a day (BID) | INTRAMUSCULAR | Status: DC
  Administered 2017-05-14 – 2017-05-17 (×7): 80 mg via INTRAVENOUS
  Filled 2017-05-13 (×7): qty 2

## 2017-05-13 MED ORDER — IPRATROPIUM-ALBUTEROL 0.5-2.5 (3) MG/3ML IN SOLN
3.0000 mL | Freq: Three times a day (TID) | RESPIRATORY_TRACT | Status: DC
  Administered 2017-05-14 – 2017-05-17 (×10): 3 mL via RESPIRATORY_TRACT
  Filled 2017-05-13 (×10): qty 3

## 2017-05-13 NOTE — ED Provider Notes (Signed)
Clayton [name] Provider Note   CSN: [id number] Arrival date & time: 05/13/17  1641     History   Chief Complaint Chief Complaint  Patient presents with  . Shortness of Breath    HPI Tajay Muzzy is a 66 y.o. male.  HPI Patient was referred from his primary 56 office for productive cough of green sputum, increased shortness of breath, generalized weakness and fatigue, fever and hypoxia. Patient states symptoms started over the weekend. Denies any chest pain. No new lower extremity swelling. Past Medical History:  Diagnosis Date  . COPD (chronic obstructive pulmonary disease) ([place])   . Diabetes mellitus   . GERD (gastroesophageal reflux disease)   . HTN (hypertension)   . Hyperlipidemia   . Myocardial infarction ([place]) 2009  . Prostate cancer ([place])   . PVD (peripheral vascular disease) [facility])     Patient Active Problem List   Diagnosis Date Noted  . Acute respiratory failure with hypoxia ([place]) 06/28/2015  . COPD exacerbation ([place]) 06/28/2015  . DM type 2 (diabetes mellitus, type 2) ([place]) 06/28/2015  . Erectile dysfunction associated with type 2 diabetes mellitus ([place]) 07/12/2014  . Erectile dysfunction 04/11/2014  . CAD S/P percutaneous coronary angioplasty 04/11/2014  . Prostate cancer ([place]) 04/27/2012  . Abnormal ECG 01/06/2012  . TOBACCO ABUSE 11/23/2010  . GERD 12/12/2009  . DIVERTICULOSIS, COLON 12/12/2009  . COLONIC POLYPS, BENIGN, HX OF 12/12/2009  . CAD, NATIVE VESSEL 12/07/2009  . ANEMIA 12/04/2009  . HYPERLIPIDEMIA 09/15/2008  . MYOCARDIAL INFARCTION, HX OF 09/15/2008  . PERIPHERAL VASCULAR DISEASE 09/15/2008  . UNSPECIFIED IRON DEFICIENCY ANEMIA 02/15/2008  . BLOOD IN STOOL, OCCULT 02/15/2008    Past Surgical History:  Procedure Laterality Date  . CARDIAC CATHETERIZATION     with stent  . cardiac stents  2009  . CATARACT EXTRACTION W/PHACO Left 08/19/2013   Procedure: CATARACT EXTRACTION PHACO AND INTRAOCULAR LENS PLACEMENT (IOC);   Surgeon: Tonny Branch, MD;  Location: [facility];  Service: Ophthalmology;  Laterality: Left;  CDE:37.77  . CATARACT EXTRACTION W/PHACO Right 07/29/2016   Procedure: CATARACT EXTRACTION PHACO AND INTRAOCULAR LENS PLACEMENT RIGHT EYE; CDE:  18.10;  Surgeon: Tonny Branch, MD;  Location: [facility];  Service: Ophthalmology;  Laterality: Right;       Home Medications    Prior to Admission medications   Medication Sig Start Date End Date Taking? Authorizing Provider  albuterol (PROVENTIL HFA;VENTOLIN HFA) 108 (90 BASE) MCG/ACT inhaler Inhale 2 puffs into the lungs every 6 (six) hours as needed for wheezing or shortness of breath. Please instruct in proper technique. 06/29/15   Samuella Cota, MD  amLODipine (NORVASC) 5 MG tablet Take 1 tablet by mouth daily. 02/05/16 02/04/17  [provider]  aspirin 81 MG tablet Take 81 mg by mouth daily.    [provider]  carvedilol (COREG) 6.25 MG tablet Take 1 tablet by mouth 2 (two) times daily. 01/06/16   [provider]  ferrous sulfate 325 (65 FE) MG tablet Take 325 mg by mouth every other day.     [provider]  fish oil-omega-3 fatty acids 1000 MG capsule Take 2 g by mouth daily.    [provider]  Fluticasone-Salmeterol (ADVAIR DISKUS) 250-50 MCG/DOSE AEPB Inhale 1 puff into the lungs 2 (two) times daily as needed. 05/25/15   [provider]  furosemide (LASIX) 20 MG tablet Take 20 mg by mouth daily. 06/22/16   [provider]  glipiZIDE (GLUCOTROL) 10 MG tablet Take 10 mg by mouth  2 (two) times daily.    [provider]  metFORMIN (GLUCOPHAGE) 1000 MG tablet Take 1 tablet by mouth 2 (two) times daily. 05/14/15   [provider]  omeprazole (PRILOSEC) 20 MG capsule Take 20 mg by mouth daily as needed (indigestion).  04/27/12   [provider]  omeprazole (PRILOSEC) 40 MG capsule Take 40 mg by mouth daily as needed (indigestion).  06/25/16   [provider]    Phenylephrine-APAP-Guaifenesin ([place] FAST-MAX) 10-650-400 MG/20ML LIQD Take 1 Dose by mouth daily as needed (congestion).    [provider]  pravastatin (PRAVACHOL) 40 MG tablet Take 1 tablet by mouth every evening.  05/14/15   [provider]  Saw Palmetto 1000 MG CAPS Take 1,000 mg by mouth 2 (two) times daily.     [provider]  sildenafil (REVATIO) 20 MG tablet Take 1-2 tablets by mouth daily as needed (intercourse).  01/08/16   [provider]  tiotropium (SPIRIVA HANDIHALER) 18 MCG inhalation capsule Place 1 capsule (18 mcg total) into inhaler and inhale daily. Please instruct in proper technique. 06/29/15   Samuella Cota, MD    Family History Family History  Problem Relation Age of Onset  . Coronary artery disease Mother   . Coronary artery disease Father   . Prostate cancer Father     Social History Social History  Substance Use Topics  . Smoking status: Current Every Day Smoker    Packs/day: 1.00    Years: 20.00    Types: Cigarettes    Start date: 07/07/1969  . Smokeless tobacco: Never Used  . Alcohol use No     Comment: occassional     Allergies   Patient has no known allergies.   Review of Systems Review of Systems  Constitutional: Positive for activity change, chills, fatigue and fever.  HENT: Negative for sore throat and trouble swallowing.   Respiratory: Positive for cough, shortness of breath and wheezing.   Cardiovascular: Negative for chest pain, palpitations and leg swelling.  Gastrointestinal: Negative for abdominal pain, diarrhea, nausea and vomiting.  Genitourinary: Negative for dysuria, flank pain and frequency.  Musculoskeletal: Negative for arthralgias, back pain, joint swelling, myalgias and neck pain.  Skin: Negative for rash and wound.  Neurological: Positive for weakness (generalized). Negative for dizziness, light-headedness, numbness and headaches.  All other systems reviewed and are  negative.    Physical Exam Updated Vital Signs BP 134/76   Pulse 94   Temp 100 F (37.8 C) (Oral)   Resp (!) 23   Ht 6' (1.829 m)   Wt 93 kg (205 lb)   SpO2 95%   BMI 27.80 kg/m   Physical Exam  Constitutional: He is oriented to person, place, and time. He appears well-developed and well-nourished. No distress.  HENT:  Head: Normocephalic and atraumatic.  Mouth/Throat: Oropharynx is clear and moist. No oropharyngeal exudate.  Eyes: Pupils are equal, round, and reactive to light. EOM are normal.  Neck: Normal range of motion. Neck supple.  Cardiovascular: Normal rate and regular rhythm.  Exam reveals no gallop and no friction rub.   No murmur heard. Pulmonary/Chest:  Diminished breath sounds throughout. Faint expiratory wheeze.  Abdominal: Soft. Bowel sounds are normal. There is no tenderness. There is no rebound and no guarding.  Musculoskeletal: Normal range of motion. He exhibits no edema or tenderness.  Neurological: He is alert and oriented to person, place, and time.  Moving all extremities without focal deficit. Sensation fully intact.  Skin: Skin is  warm and dry. Capillary refill takes less than 2 seconds. No rash noted. No erythema.  Psychiatric: He has a normal mood and affect. His behavior is normal.  Nursing note and vitals reviewed.    ED Treatments / Results  Labs (all labs ordered are listed, but only abnormal results are displayed) Labs Reviewed  COMPREHENSIVE METABOLIC PANEL - Abnormal; Notable for the following:       Result Value   Chloride 100 (*)    Glucose, Bld 171 (*)    Total Bilirubin 1.6 (*)    All other components within normal limits  CBC WITH DIFFERENTIAL/PLATELET - Abnormal; Notable for the following:    Lymphs Abs 0.4 (*)    All other components within normal limits  BRAIN NATRIURETIC PEPTIDE - Abnormal; Notable for the following:    B Natriuretic Peptide 337.0 (*)    All other components within normal limits  TROPONIN I - Abnormal;  Notable for the following:    Troponin I 0.03 (*)    All other components within normal limits  CULTURE, BLOOD (ROUTINE X 2)  CULTURE, BLOOD (ROUTINE X 2)  URINALYSIS, ROUTINE W REFLEX MICROSCOPIC  I-STAT CG4 LACTIC ACID, ED    EKG  EKG Interpretation None       Radiology Dg Chest 2 View  Result Date: 05/13/2017 CLINICAL DATA:  Cough, hypoxia EXAM: CHEST  2 VIEW COMPARISON:  06/27/2015 FINDINGS: Heart is borderline in size. Mild interstitial prominence throughout the lungs could reflect interstitial edema or interstitial pneumonitis. No effusions. No acute bony abnormality. IMPRESSION: Borderline heart size. Interstitial prominence throughout the lungs, question interstitial edema or interstitial pneumonitis. Electronically Signed   By: Rolm Baptise M.D.   On: 05/13/2017 17:38    Procedures Procedures (including critical care time)  Medications Ordered in ED Medications  levofloxacin (LEVAQUIN) IVPB 750 mg (750 mg Intravenous New Bag/Given 05/13/17 1759)  acetaminophen (TYLENOL) tablet 1,000 mg (1,000 mg Oral Given 05/13/17 1700)  sodium chloride 0.9 % bolus 1,000 mL (0 mLs Intravenous Stopped 05/13/17 1759)  albuterol (PROVENTIL) (2.5 MG/3ML) 0.083% nebulizer solution 5 mg (5 mg Nebulization Given 05/13/17 1807)  methylPREDNISolone sodium succinate (SOLU-MEDROL) 125 mg/2 mL injection 125 mg (125 mg Intravenous Given 05/13/17 1757)     Initial Impression / Assessment and Plan / ED Course  I have reviewed the triage vital signs and the nursing notes.  Pertinent labs & imaging results that were available during my care of the patient were reviewed by me and considered in my medical decision making (see chart for details).     Given Solu-Medrol, albuterol and Levaquin. Patient has normal lactic acid and does not appear to be septic. Likely has infectious exacerbation of his COPD. Discussed with hospitalist will see patient in emergency department and admit.  Final Clinical  Impressions(s) / ED Diagnoses   Final diagnoses:  COPD exacerbation [facility])    New Prescriptions New Prescriptions   No medications on file     Julianne Rice, MD 05/13/17 [phone]

## 2017-05-13 NOTE — Progress Notes (Signed)
Pt doesn't wish to have breathing treatment. Pt agreed to take treatment but didn't really want it

## 2017-05-13 NOTE — ED Notes (Signed)
Pt taken to xray 

## 2017-05-13 NOTE — ED Triage Notes (Signed)
Pt comes in from urgent care for cough and hypoxia (oxygen of 82% on room air with increase to 95% on 2L). Pt has oxygen of 74% on room air. Pt is short of breath but can complete full sentences with no problems. Pt placed on oxygen.

## 2017-05-13 NOTE — H&P (Signed)
History and Physical    Clayton [name] YQI:[id number] DOB: [birth year] DOA: 05/13/2017  PCP: Dione Housekeeper, MD Consultants:  [facility]; [facility] - cardiology Patient coming from: Home - lives alone; [facility]: sister, [phone]  Chief Complaint: SOB  HPI: Clayton [name] is a 66 y.o. male with medical history significant of remote prostate cancer; remote CAD; COPD on home O2; DM; HLD; HTN; and GERD presenting because "I got a little sick".  He reports that he "got weak".  He was also having a lot of trouble breathing.  He started feeling bad over the weekend.  He had no energy, "no get up and go".  Started feeling SOB about the same time.  +wheezing.  Does not know of a fever.  Wears O2 at home, has no idea how much.  Was SOB with walking around.  He saw his PCP today and was sent here.  +cough, productive of yellow-green sputum.  His sister reports that his O2 sats dropped in the low 80s with minimal ambulation.   ED Course: COPD exacerbation - given Solumedrol, Albuterol, Levaquin.  Normal lactate, does not appear to be septic.  Review of Systems: As per HPI; otherwise review of systems reviewed and negative.   Ambulatory Status:  Ambulates without assistance  Past Medical History:  Diagnosis Date  . COPD (chronic obstructive pulmonary disease) ([place])   . Diabetes mellitus   . GERD (gastroesophageal reflux disease)   . HTN (hypertension)   . Hyperlipidemia   . Myocardial infarction ([place]) 2009  . Prostate cancer ([place])   . PVD (peripheral vascular disease) ([place])     Past Surgical History:  Procedure Laterality Date  . CARDIAC CATHETERIZATION     with stent  . cardiac stents  2009  . CATARACT EXTRACTION W/PHACO Left 08/19/2013   Procedure: CATARACT EXTRACTION PHACO AND INTRAOCULAR LENS PLACEMENT (IOC);  Surgeon: Tonny Branch, MD;  Location: [facility];  Service: Ophthalmology;  Laterality: Left;  CDE:37.77  . CATARACT EXTRACTION W/PHACO Right 07/29/2016   Procedure: CATARACT  EXTRACTION PHACO AND INTRAOCULAR LENS PLACEMENT RIGHT EYE; CDE:  18.10;  Surgeon: Tonny Branch, MD;  Location: [facility];  Service: Ophthalmology;  Laterality: Right;    Social History   Social History  . Marital status: Single    Spouse name: N/A  . Number of children: N/A  . Years of education: N/A   Occupational History  . material handler    Social History Main Topics  . Smoking status: Current Every Day Smoker    Packs/day: 1.00    Years: 42.00    Types: Cigarettes    Start date: 07/07/1969  . Smokeless tobacco: Never Used  . Alcohol use No     Comment: occassional  . Drug use: No  . Sexual activity: Yes   Other Topics Concern  . Not on file   Social History Narrative  . No narrative on file    No Known Allergies  Family History  Problem Relation Age of Onset  . Coronary artery disease Mother   . Coronary artery disease Father   . Prostate cancer Father     Prior to Admission medications   Medication Sig Start Date End Date Taking? Authorizing Provider  albuterol (PROVENTIL HFA;VENTOLIN HFA) 108 (90 BASE) MCG/ACT inhaler Inhale 2 puffs into the lungs every 6 (six) hours as needed for wheezing or shortness of breath. Please instruct in proper technique. 06/29/15   Samuella Cota, MD  amLODipine (NORVASC) 5 MG tablet Take 1 tablet by mouth  daily. 02/05/16 02/04/17  [provider]  aspirin 81 MG tablet Take 81 mg by mouth daily.    [provider]  carvedilol (COREG) 6.25 MG tablet Take 1 tablet by mouth 2 (two) times daily. 01/06/16   [provider]  ferrous sulfate 325 (65 FE) MG tablet Take 325 mg by mouth every other day.     [provider]  fish oil-omega-3 fatty acids 1000 MG capsule Take 2 g by mouth daily.    [provider]  Fluticasone-Salmeterol (ADVAIR DISKUS) 250-50 MCG/DOSE AEPB Inhale 1 puff into the lungs 2 (two) times daily as needed. 05/25/15   [provider]  furosemide (LASIX) 20 MG tablet  Take 20 mg by mouth daily. 06/22/16   [provider]  glipiZIDE (GLUCOTROL) 10 MG tablet Take 10 mg by mouth 2 (two) times daily.    [provider]  metFORMIN (GLUCOPHAGE) 1000 MG tablet Take 1 tablet by mouth 2 (two) times daily. 05/14/15   [provider]  omeprazole (PRILOSEC) 20 MG capsule Take 20 mg by mouth daily as needed (indigestion).  04/27/12   [provider]  omeprazole (PRILOSEC) 40 MG capsule Take 40 mg by mouth daily as needed (indigestion).  06/25/16   [provider]  Phenylephrine-APAP-Guaifenesin ([place] FAST-MAX) 10-650-400 MG/20ML LIQD Take 1 Dose by mouth daily as needed (congestion).    [provider]  pravastatin (PRAVACHOL) 40 MG tablet Take 1 tablet by mouth every evening.  05/14/15   [provider]  Saw Palmetto 1000 MG CAPS Take 1,000 mg by mouth 2 (two) times daily.     [provider]  sildenafil (REVATIO) 20 MG tablet Take 1-2 tablets by mouth daily as needed (intercourse).  01/08/16   [provider]  tiotropium (SPIRIVA HANDIHALER) 18 MCG inhalation capsule Place 1 capsule (18 mcg total) into inhaler and inhale daily. Please instruct in proper technique. 06/29/15   Samuella Cota, MD    Physical Exam: Vitals:   05/13/17 1915 05/13/17 1917 05/13/17 1930 05/13/17 2030  BP:  138/81 131/68 (!) 165/76  Pulse: 92 92  93  Resp: (!) 35 17 (!) 32 (!) 31  Temp:      TempSrc:      SpO2: 95% 95%  91%  Weight:      Height:         General:  Appears calm and comfortable and is NAD Eyes:  PERRL, EOMI, normal lids, iris ENT:  grossly normal hearing, lips & tongue, mmm; appropriate dentition Neck:  no LAD, masses or thyromegaly; no carotid bruits Cardiovascular:  RRR, no m/r/g. No LE edema.  Respiratory:  Poor air movement, increased WOB, scattered expiratory wheezes. Abdomen:  soft, NT, ND, NABS Back:   normal alignment, no CVAT Skin:  no rash or induration seen on limited  exam Musculoskeletal:  grossly normal tone BUE/BLE, good ROM, no bony abnormality Lower extremity:  No LE edema.  Limited foot exam with no ulcerations.  2+ distal pulses. Psychiatric:  grossly normal mood and affect, speech fluent and appropriate, AOx3 Neurologic:  CN 2-12 grossly intact, moves all extremities in coordinated fashion, sensation intact    Radiological Exams on Admission: Dg Chest 2 View  Result Date: 05/13/2017 CLINICAL DATA:  Cough, hypoxia EXAM: CHEST  2 VIEW COMPARISON:  06/27/2015 FINDINGS: Heart is borderline in size. Mild interstitial prominence throughout the lungs could reflect interstitial edema or interstitial pneumonitis. No effusions. No acute bony abnormality. IMPRESSION: Borderline heart size. Interstitial prominence  throughout the lungs, question interstitial edema or interstitial pneumonitis. Electronically Signed   By: Rolm Baptise M.D.   On: 05/13/2017 17:38    EKG: not done   Labs on Admission: I have personally reviewed the available labs and imaging studies at the time of the admission.  Pertinent labs:   Glucose 171 BNP 337, prior 436 in 10/16 Troponin 0.03 Lactate 0.7 WBC 5.1  Assessment/Plan Principal Problem:   Acute on chronic respiratory failure with hypoxia (HCC) Active Problems:   TOBACCO ABUSE   COPD exacerbation (HCC)   DM type 2 (diabetes mellitus, type 2) (HCC)   Elevated troponin   Acute on chronic respiratory failure with hypoxia associated with COPD exacerbation -Patient's shortness of breath and productive cough are most likely caused by acute COPD exacerbation.  -He has history of O2-dependent COPD -He developed fever in the ER but does not have leukocytosis. Chest x-ray is not consistent with pneumonia (although there is interstitial prominence noted). -He was given a neb treatment and steroids in the ED with some improvement but he remains quite tight -will admit patient . -Nebulizers: scheduled Duoneb and prn  albuterol -Solu-Medrol 80 mg IV BID  -IV Doxycycline (received Levaquin in the ER) - has 3/3 cardinal symptoms -Pulmonology consult in the AM -PT/OT/CM/SW/Nutrition consults   Tobacco dependence -Encourage cessation.  This was discussed with the patient and should be reviewed on an ongoing basis.   -Patch ordered at patient request.  Elevated troponin -Likely associated with cardiac strain -Will trend -Currently very low suspicion for ACS -Will request EKG -Mildly elevated BNP but lower than prior and patient without other apparent edema; CXR equivocal.  For now, will follow without diuresis.  DM -Will check A1c -hold Glucophage and Glucotrol -Cover with SSI   DVT prophylaxis: Lovenox  Code Status: Full - confirmed with patient/family Family Communication: Sister present throughout evaluation Disposition Plan:  Home once clinically improved Consults called: Pulmonology; PT/OT/SW/CM/Nutrition  Admission status: Admit - It is my clinical opinion that admission to INPATIENT is reasonable and necessary because this patient will require at least 2 midnights in the hospital to treat this condition based on the medical complexity of the problems presented.  Given the aforementioned information, the predictability of an adverse outcome is felt to be significant.    Karmen Bongo MD [facility]  If note is complete, please contact covering daytime or nighttime physician. [email or web address] Password [facility]  05/13/2017, 8:57 PM

## 2017-05-13 NOTE — ED Notes (Signed)
CRITICAL VALUE ALERT  Critical Value:  troponin 0.03  Date & Time Notied:  05/13/17 1816  Provider Notified: dr Lita Mains  Orders Received/Actions taken:

## 2017-05-14 DIAGNOSIS — R748 Abnormal levels of other serum enzymes: Secondary | ICD-10-CM

## 2017-05-14 DIAGNOSIS — F17200 Nicotine dependence, unspecified, uncomplicated: Secondary | ICD-10-CM

## 2017-05-14 LAB — GLUCOSE, CAPILLARY
GLUCOSE-CAPILLARY: 294 mg/dL — AB (ref 65–99)
GLUCOSE-CAPILLARY: 242 mg/dL — AB (ref 65–99)
Glucose-Capillary: 225 mg/dL — ABNORMAL HIGH (ref 65–99)
Glucose-Capillary: 245 mg/dL — ABNORMAL HIGH (ref 65–99)

## 2017-05-14 LAB — HEMOGLOBIN A1C
Hgb A1c MFr Bld: 6.4 % — ABNORMAL HIGH (ref 4.8–5.6)
MEAN PLASMA GLUCOSE: 136.98 mg/dL

## 2017-05-14 LAB — BASIC METABOLIC PANEL
ANION GAP: 6 (ref 5–15)
BUN: 9 mg/dL (ref 6–20)
CALCIUM: 8.6 mg/dL — AB (ref 8.9–10.3)
CO2: 30 mmol/L (ref 22–32)
CREATININE: 0.62 mg/dL (ref 0.61–1.24)
Chloride: 99 mmol/L — ABNORMAL LOW (ref 101–111)
Glucose, Bld: 231 mg/dL — ABNORMAL HIGH (ref 65–99)
Potassium: 4.6 mmol/L (ref 3.5–5.1)
Sodium: 135 mmol/L (ref 135–145)

## 2017-05-14 LAB — CBC
HCT: 42.7 % (ref 39.0–52.0)
Hemoglobin: 13.6 g/dL (ref 13.0–17.0)
MCH: 30.1 pg (ref 26.0–34.0)
MCHC: 31.9 g/dL (ref 30.0–36.0)
MCV: 94.5 fL (ref 78.0–100.0)
PLATELETS: 160 10*3/uL (ref 150–400)
RBC: 4.52 MIL/uL (ref 4.22–5.81)
RDW: 13.3 % (ref 11.5–15.5)
WBC: 3 10*3/uL — AB (ref 4.0–10.5)

## 2017-05-14 LAB — TROPONIN I: Troponin I: <0.03 ng/mL (ref <0.03)

## 2017-05-14 MED ORDER — ORAL CARE MOUTH RINSE
15.0000 mL | Freq: Two times a day (BID) | OROMUCOSAL | Status: DC
  Administered 2017-05-14 – 2017-05-16 (×5): 15 mL via OROMUCOSAL

## 2017-05-14 NOTE — Consult Note (Signed)
Consult requested by:Dr. Marin Comment Consult requested for: COPD exacerbation/acute on chronic hypoxic respiratory failure  HPI: This is a 66 year old with a long known history of COPD and who has had shortness of breath for the last several days. He went to his primary care physician's office and was sent to the hospital because of shortness of breath. He says he is been coughing up sputum. He had poor energy level. He's coughed up yellowish-green sputum. His oxygen has been dropping into the low 80s with only walking a few feet. He's not had any chest pain. He thinks he may have had some swelling of his left leg. At baseline he has a history of prostate cancer coronary disease COPD on home oxygen diabetes hyperlipidemia hypertension and reflux. There is a note in his chart that he has peripheral vascular disease but I'm not sure the extent of that. He's not having any nausea vomiting diarrhea. He had some fever when he got to the emergency department  Past Medical History:  Diagnosis Date  . COPD (chronic obstructive pulmonary disease) ([place])   . Diabetes mellitus   . GERD (gastroesophageal reflux disease)   . HTN (hypertension)   . Hyperlipidemia   . Myocardial infarction ([place]) 2009  . Prostate cancer ([place])   . PVD (peripheral vascular disease) (H[facility])      Family History  Problem Relation Age of Onset  . Coronary artery disease Mother   . Coronary artery disease Father   . Prostate cancer Father      Social History   Social History  . Marital status: Single    Spouse name: N/A  . Number of children: N/A  . Years of education: N/A   Occupational History  . material handler    Social History Main Topics  . Smoking status: Current Every Day Smoker    Packs/day: 1.00    Years: 42.00    Types: Cigarettes    Start date: 07/07/1969  . Smokeless tobacco: Never Used  . Alcohol use No     Comment: occassional  . Drug use: No  . Sexual activity: Yes   Other Topics Concern  . None    Social History Narrative  . None     ROS: Except as mentioned 10 point review of systems is negative    Objective: Vital signs in last 24 hours: Temp:  [98.5 F (36.9 C)-102.4 F (39.1 C)] 98.5 F (36.9 C) (08/29 0432) Pulse Rate:  [76-97] 76 (08/29 0432) Resp:  [17-35] 20 (08/29 0432) BP: (131-174)/(68-92) 147/92 (08/29 0432) SpO2:  [86 %-99 %] 86 % (08/29 0752) Weight:  [93 kg (205 lb)] 93 kg (205 lb) (08/28 1650) Weight change:     Intake/Output from previous day: 08/28 0701 - 08/29 0700 In: 1520 [P.O.:120; IV Piggyback:1400] Out: -   PHYSICAL EXAM Constitutional: He is awake and alert and in no acute distress. Eyes: Pupils react. EOMI. Ears nose mouth and throat: Hearing is grossly normal. His throat is clear. Cardiovascular: His heart is regular with normal heart sounds. No edema. Respiratory: His respiratory effort is normal at rest. He has mild end-expiratory wheezing now. Gastrointestinal: His abdomen is soft with no masses. Musculoskeletal: Normal strength. Neurological: No focal abnormalities. Psychiatric: Normal mood and affect  Lab Results: Basic Metabolic Panel:  Recent Labs  05/13/17 1710 05/14/17 0320  NA 137 135  K 4.2 4.6  CL 100* 99*  CO2 32 30  GLUCOSE 171* 231*  BUN 9 9  CREATININE 0.63 0.62  CALCIUM  8.9 8.6*   Liver Function Tests:  Recent Labs  05/13/17 1710  AST 25  ALT 25  ALKPHOS 109  BILITOT 1.6*  PROT 7.7  ALBUMIN 4.1   No results for input(s): LIPASE, AMYLASE in the last 72 hours. No results for input(s): AMMONIA in the last 72 hours. CBC:  Recent Labs  05/13/17 1710 05/14/17 0320  WBC 5.1 3.0*  NEUTROABS 3.9  --   HGB 13.5 13.6  HCT 42.0 42.7  MCV 94.2 94.5  PLT 172 160   Cardiac Enzymes:  Recent Labs  05/13/17 1747 05/13/17 2125 05/14/17 0320  TROPONINI 0.03* 0.03* <0.03   BNP: No results for input(s): PROBNP in the last 72 hours. D-Dimer: No results for input(s): DDIMER in the last 72  hours. CBG:  Recent Labs  05/14/17 0734  GLUCAP 225*   Hemoglobin A1C: No results for input(s): HGBA1C in the last 72 hours. Fasting Lipid Panel: No results for input(s): CHOL, HDL, LDLCALC, TRIG, CHOLHDL, LDLDIRECT in the last 72 hours. Thyroid Function Tests: No results for input(s): TSH, T4TOTAL, FREET4, T3FREE, THYROIDAB in the last 72 hours. Anemia Panel: No results for input(s): VITAMINB12, FOLATE, FERRITIN, TIBC, IRON, RETICCTPCT in the last 72 hours. Coagulation: No results for input(s): LABPROT, INR in the last 72 hours. Urine Drug Screen: Drugs of Abuse  No results found for: LABOPIA, COCAINSCRNUR, LABBENZ, AMPHETMU, THCU, LABBARB  Alcohol Level: No results for input(s): ETH in the last 72 hours. Urinalysis: No results for input(s): COLORURINE, LABSPEC, PHURINE, GLUCOSEU, HGBUR, BILIRUBINUR, KETONESUR, PROTEINUR, UROBILINOGEN, NITRITE, LEUKOCYTESUR in the last 72 hours.  Invalid input(s): APPERANCEUR Misc. Labs:   ABGS: No results for input(s): PHART, PO2ART, TCO2, HCO3 in the last 72 hours.  Invalid input(s): PCO2   MICROBIOLOGY: Recent Results (from the past 240 hour(s))  Blood culture (routine x 2)     Status: None (Preliminary result)   Collection Time: 05/13/17  5:10 PM  Result Value Ref Range Status   Specimen Description BLOOD RIGHT FOREARM  Final   Special Requests   Final    BOTTLES DRAWN AEROBIC AND ANAEROBIC Blood Culture adequate volume   Culture NO GROWTH < 24 HOURS  Final   Report Status PENDING  Incomplete  Blood culture (routine x 2)     Status: None (Preliminary result)   Collection Time: 05/13/17  5:10 PM  Result Value Ref Range Status   Specimen Description BLOOD LEFT FOREARM  Final   Special Requests   Final    BOTTLES DRAWN AEROBIC AND ANAEROBIC Blood Culture results may not be optimal due to an inadequate volume of blood received in culture bottles   Culture NO GROWTH < 24 HOURS  Final   Report Status PENDING  Incomplete     Studies/Results: Dg Chest 2 View  Result Date: 05/13/2017 CLINICAL DATA:  Cough, hypoxia EXAM: CHEST  2 VIEW COMPARISON:  06/27/2015 FINDINGS: Heart is borderline in size. Mild interstitial prominence throughout the lungs could reflect interstitial edema or interstitial pneumonitis. No effusions. No acute bony abnormality. IMPRESSION: Borderline heart size. Interstitial prominence throughout the lungs, question interstitial edema or interstitial pneumonitis. Electronically Signed   By: Rolm Baptise M.D.   On: 05/13/2017 17:38    Medications:  Prior to Admission:  Prescriptions Prior to Admission  Medication Sig Dispense Refill Last Dose  . albuterol (PROVENTIL HFA;VENTOLIN HFA) 108 (90 BASE) MCG/ACT inhaler Inhale 2 puffs into the lungs every 6 (six) hours as needed for wheezing or shortness of breath. Please instruct in  proper technique. 1 Inhaler 1 unknown  . aspirin 81 MG tablet Take 81 mg by mouth daily.   05/12/2017 at Unknown time  . carvedilol (COREG) 6.25 MG tablet Take 1 tablet by mouth 2 (two) times daily.   05/13/2017 at Unknown time  . ferrous sulfate 325 (65 FE) MG tablet Take 325 mg by mouth daily with breakfast.    05/13/2017 at Unknown time  . glipiZIDE (GLUCOTROL) 10 MG tablet Take 10 mg by mouth 2 (two) times daily.   05/12/2017 at Unknown time  . metFORMIN (GLUCOPHAGE) 1000 MG tablet Take 1 tablet by mouth 2 (two) times daily.   05/13/2017 at Unknown time  . omeprazole (PRILOSEC) 20 MG capsule Take 20 mg by mouth daily as needed (indigestion).    unknown  . pravastatin (PRAVACHOL) 40 MG tablet Take 1 tablet by mouth every evening.    05/12/2017 at Unknown time  . quinapril (ACCUPRIL) 40 MG tablet Take 40 mg by mouth at bedtime.   05/12/2017 at Unknown time  . Saw Palmetto 1000 MG CAPS Take 1,000 mg by mouth 2 (two) times daily.    05/13/2017 at Unknown time  . sildenafil (REVATIO) 20 MG tablet Take 2-5 tablets by mouth daily as needed (intercourse).    unknown   Scheduled: .  aspirin EC  81 mg Oral Daily  . carvedilol  6.25 mg Oral BID  . docusate sodium  100 mg Oral BID  . enoxaparin (LOVENOX) injection  40 mg Subcutaneous Q24H  . insulin aspart  0-15 Units Subcutaneous TID WC  . ipratropium-albuterol  3 mL Nebulization TID  . lisinopril  40 mg Oral QHS  . mouth rinse  15 mL Mouth Rinse BID  . methylPREDNISolone (SOLU-MEDROL) injection  80 mg Intravenous Q12H  . nicotine  14 mg Transdermal Daily  . pravastatin  40 mg Oral QPM   Continuous: . doxycycline (VIBRAMYCIN) IV Stopped (05/14/17 0227)   SAY:[id number] **OR** acetaminophen, albuterol, ondansetron **OR** ondansetron (ZOFRAN) IV  Assesment: He was admitted with COPD exacerbation and acute on chronic hypoxic respiratory failure. He is on appropriate treatment. I think he probably has acute bronchitis. His troponin level is elevated but it seems to be demand ischemia. Principal Problem:   Acute on chronic respiratory failure with hypoxia (HCC) Active Problems:   TOBACCO ABUSE   COPD exacerbation (HCC)   DM type 2 (diabetes mellitus, type 2) (HCC)   Elevated troponin    Plan: Continue current treatments with IV steroids IV antibiotics inhale bronchodilators nicotine patch. He is not on any maintenance inhalers at home so those will be started when he is closer to discharge    LOS: 1 day   Pascual Mantel L 05/14/2017, 8:45 AM

## 2017-05-14 NOTE — Progress Notes (Signed)
PROGRESS NOTE    Clayton [name]  YIR:[id number] DOB: 07/16/1951 DOA: 05/13/2017 PCP: Dione Housekeeper, MD    Brief Narrative: patient is a 66 yo active smoker with hx of COPD on home oxygen, admitted for COPD exacerbation.  He was seen in consultation with Dr Luan Pulling, and is currently getting IV steroids and nebs along with IV Doxycycline.  He is feeling just a little better.  He is still wheezing and is not ready for discharge as yet.    Assessment & Plan:   Principal Problem:   Acute on chronic respiratory failure with hypoxia (HCC) Active Problems:   TOBACCO ABUSE   COPD exacerbation (HCC)   DM type 2 (diabetes mellitus, type 2) (HCC)   Elevated troponin   1. COPD exacerbation:  Continue with IV steroids, nebs, and antibiotics.  He will need to be on an inhaler per Dr Luan Pulling.  I will put him on Advair upon discharge.   Hopefully he can go home tomorrow. 2. Tobacco abuse:  He actually doesn't want to quit cigarettes.  He doesn't want to nicotine patch I offered him. 3. DM:  Continue to monitor. 4. Elevated troponins:  Doubt ACS.  Will follow.    DVT prophylaxis:  Lovenox.  Code Status: FULL CODE.  Family Communication: friend at bedside with his permission.  Disposition Plan: Home.   Consultants:   Pulmonary.  Procedures:    FULL CODE.   Antimicrobials: Anti-infectives    Start     Dose/Rate Route Frequency Ordered Stop   05/13/17 2200  doxycycline (VIBRAMYCIN) 100 mg in dextrose 5 % 250 mL IVPB     100 mg 125 mL/hr over 120 Minutes Intravenous Every 12 hours 05/13/17 2053     05/13/17 1800  levofloxacin (LEVAQUIN) IVPB 750 mg     750 mg 100 mL/hr over 90 Minutes Intravenous  Once 05/13/17 1749 05/13/17 1940       Subjective   Doing well.    Objective: Vitals:   05/13/17 2208 05/13/17 2211 05/14/17 0432 05/14/17 0752  BP: (!) 174/76  (!) 147/92   Pulse: 92  76   Resp: (!) 30  20   Temp: 99.2 F (37.3 C)  98.5 F (36.9 C)   TempSrc: Oral  Oral     SpO2: 94% 96% 90% (!) 86%  Weight:      Height:        Intake/Output Summary (Last 24 hours) at 05/14/17 1344 Last data filed at 05/14/17 0500  Gross per 24 hour  Intake             1520 ml  Output                0 ml  Net             1520 ml   Filed Weights   05/13/17 1650  Weight: 93 kg (205 lb)    Examination:  General exam: Appears calm and comfortable  Respiratory system:  Bilateral wheezing still.  Respiratory effort normal. Cardiovascular system: S1 & S2 heard, RRR. No JVD, murmurs, rubs, gallops or clicks. No pedal edema. Gastrointestinal system: Abdomen is nondistended, soft and nontender. No organomegaly or masses felt. Normal bowel sounds heard. Central nervous system: Alert and oriented. No focal neurological deficits. Extremities: Symmetric 5 x 5 power. Skin: No rashes, lesions or ulcers Psychiatry: Judgement and insight appear normal. Mood & affect appropriate.   Data Reviewed: I have personally reviewed following labs and imaging studies  CBC:  Recent Labs Lab 05/13/17 1710 05/14/17 0320  WBC 5.1 3.0*  NEUTROABS 3.9  --   HGB 13.5 13.6  HCT 42.0 42.7  MCV 94.2 94.5  PLT 172 242   Basic Metabolic Panel:  Recent Labs Lab 05/13/17 1710 05/14/17 0320  NA 137 135  K 4.2 4.6  CL 100* 99*  CO2 32 30  GLUCOSE 171* 231*  BUN 9 9  CREATININE 0.63 0.62  CALCIUM 8.9 8.6*   GFR: Estimated Creatinine Clearance: 102.4 mL/min (by C-G formula based on SCr of 0.62 mg/dL). Liver Function Tests:  Recent Labs Lab 05/13/17 1710  AST 25  ALT 25  ALKPHOS 109  BILITOT 1.6*  PROT 7.7  ALBUMIN 4.1   Cardiac Enzymes:  Recent Labs Lab 05/13/17 1747 05/13/17 2125 05/14/17 0320 05/14/17 0900  TROPONINI 0.03* 0.03* <0.03 <0.03   HbA1C:  Recent Labs  05/13/17 1710  HGBA1C 6.4*   CBG:  Recent Labs Lab 05/14/17 0734 05/14/17 1111  GLUCAP 225* 245*   Sepsis Labs:  Recent Labs Lab 05/13/17 1714  LATICACIDVEN 0.70    Recent Results  (from the past 240 hour(s))  Blood culture (routine x 2)     Status: None (Preliminary result)   Collection Time: 05/13/17  5:10 PM  Result Value Ref Range Status   Specimen Description BLOOD RIGHT FOREARM  Final   Special Requests   Final    BOTTLES DRAWN AEROBIC AND ANAEROBIC Blood Culture adequate volume   Culture NO GROWTH < 24 HOURS  Final   Report Status PENDING  Incomplete  Blood culture (routine x 2)     Status: None (Preliminary result)   Collection Time: 05/13/17  5:10 PM  Result Value Ref Range Status   Specimen Description BLOOD LEFT FOREARM  Final   Special Requests   Final    BOTTLES DRAWN AEROBIC AND ANAEROBIC Blood Culture results may not be optimal due to an inadequate volume of blood received in culture bottles   Culture NO GROWTH < 24 HOURS  Final   Report Status PENDING  Incomplete     Radiology Studies: Dg Chest 2 View  Result Date: 05/13/2017 CLINICAL DATA:  Cough, hypoxia EXAM: CHEST  2 VIEW COMPARISON:  06/27/2015 FINDINGS: Heart is borderline in size. Mild interstitial prominence throughout the lungs could reflect interstitial edema or interstitial pneumonitis. No effusions. No acute bony abnormality. IMPRESSION: Borderline heart size. Interstitial prominence throughout the lungs, question interstitial edema or interstitial pneumonitis. Electronically Signed   By: Rolm Baptise M.D.   On: 05/13/2017 17:38    Scheduled Meds: . aspirin EC  81 mg Oral Daily  . carvedilol  6.25 mg Oral BID  . docusate sodium  100 mg Oral BID  . enoxaparin (LOVENOX) injection  40 mg Subcutaneous Q24H  . insulin aspart  0-15 Units Subcutaneous TID WC  . ipratropium-albuterol  3 mL Nebulization TID  . lisinopril  40 mg Oral QHS  . mouth rinse  15 mL Mouth Rinse BID  . methylPREDNISolone (SOLU-MEDROL) injection  80 mg Intravenous Q12H  . nicotine  14 mg Transdermal Daily  . pravastatin  40 mg Oral QPM   Continuous Infusions: . doxycycline (VIBRAMYCIN) IV 100 mg (05/14/17 1208)       LOS: 1 day   Arnita Koons, MD FACP Hospitalist.   If 7PM-7AM, please contact night-coverage [email or web address] Password [email or web address] 05/14/2017, 1:44 PM

## 2017-05-14 NOTE — Progress Notes (Signed)
Nutrition Brief Note  RD consulted for assessment of nutritional status r/t COPD exacerbation.  Wt Readings from Last 15 Encounters:  05/13/17 205 lb (93 kg)  07/29/16 205 lb (93 kg)  07/08/16 205 lb (93 kg)  02/07/16 220 lb (99.8 kg)  11/10/15 219 lb 9.6 oz (99.6 kg)  06/27/15 216 lb 4.8 oz (98.1 kg)  06/23/15 217 lb (98.4 kg)  05/26/15 214 lb (97.1 kg)  12/22/14 211 lb (95.7 kg)  10/11/14 211 lb 1.6 oz (95.8 kg)  07/12/14 211 lb (95.7 kg)  04/11/14 214 lb 6.4 oz (97.3 kg)  10/11/13 209 lb (94.8 kg)  08/09/13 204 lb (92.5 kg)  06/23/13 210 lb (95.3 kg)    Body mass index is 27.8 kg/m. Patient meets criteria for overweight based on current BMI. Pt reports no recent weight loss.   Current diet order is Carbohydrate Modified, patient is consuming approximately 75-95% of meals at this time, r/t food choices available. Pt reports good PO intake PTA.  Nutrition focused physical exam completed. Findings include no muscle or fat depletions.   Labs and medications reviewed.   No nutrition interventions warranted at this time. If nutrition issues arise, please consult RD.   Parks Ranger, RDN 05/14/2017 11:03 AM

## 2017-05-14 NOTE — Evaluation (Signed)
Physical Therapy Evaluation Patient Details Name: Clayton [name] MRN: [id number] DOB: 1950/10/13 Today's Date: 05/14/2017   History of Present Illness   Clayton [name] is a 66 y.o. male with medical history significant of remote prostate cancer; remote CAD; COPD on home O2; DM; HLD; HTN; and GERD presenting because "I got a little sick".  He reports that he "got weak".  He was also having a lot of trouble breathing.  He started feeling bad over the weekend.  He had no energy, "no get up and go".  Started feeling SOB about the same time.  +wheezing.  Does not know of a fever.  Wears O2 at home, has no idea how much.  Was SOB with walking around.  He saw his PCP today and was sent here.  +cough, productive of yellow-green sputum.  His sister reports that his O2 sats dropped in the low 80s with minimal ambulation.    Clinical Impression  Patient presents seated at bedside, Supervised gait without A/D x 125 feet, demonstrates slighlty labored cadence going up/down steps requiring rest break at the top, overall near baseline and will benefit from continued therapy while in hospital for a couple of visits to improve strength/endurance for gait and ADLs.    Follow Up Recommendations No PT follow up    Equipment Recommendations  None recommended by PT    Recommendations for Other Services       Precautions / Restrictions Precautions Precautions: Other (comment) Precaution Comments: Supplemental O2 dependent Restrictions Weight Bearing Restrictions: No      Mobility  Bed Mobility Overal bed mobility: Needs Assistance Bed Mobility: Supine to Sit;Sit to Supine     Supine to sit: Supervision        Transfers Overall transfer level: Needs assistance Equipment used: None Transfers: Sit to/from [employer] Transfers Sit to Stand: Supervision Stand pivot transfers: Supervision          Ambulation/Gait Ambulation/Gait assistance: Supervision Ambulation Distance (Feet): 125  Feet Assistive device: None Gait Pattern/deviations: WFL(Within Functional Limits)   Gait velocity interpretation: at or above normal speed for age/gender General Gait Details: Patient demonstrates slightly labored cadence without loss of balance, on 2 liters O2  Stairs Stairs: Yes Stairs assistance: Supervision Stair Management: One rail Right Number of Stairs: 9 General stair comments: Had to take standing rest break leaning on rails after walking up steps before walking back down  Wheelchair Mobility    Modified Rankin (Stroke Patients Only)       Balance Overall balance assessment: Needs assistance Sitting-balance support: No upper extremity supported;Feet supported Sitting balance-Leahy Scale: Good     Standing balance support: No upper extremity supported;During functional activity Standing balance-Leahy Scale: Fair                               Pertinent Vitals/Pain Pain Assessment: No/denies pain    Home Living Family/patient expects to be discharged to:: Private residence Living Arrangements: Alone Available Help at Discharge: Family Type of Home: Other(Comment) Home Access: Stairs to enter Entrance Stairs-Rails: Can reach both Entrance Stairs-Number of Steps: 14 steps in back, 15 steps in front Home Layout: Two level Home Equipment: None Additional Comments: Patient states he lives in the top floor of a funeral home and has family members able to assist if needed    Prior Function Level of Independence: Independent               Hand Dominance  Extremity/Trunk Assessment   Upper Extremity Assessment Upper Extremity Assessment: Overall WFL for tasks assessed    Lower Extremity Assessment Lower Extremity Assessment: Overall WFL for tasks assessed    Cervical / Trunk Assessment Cervical / Trunk Assessment: Normal  Communication   Communication: No difficulties  Cognition Arousal/Alertness: Awake/alert Behavior During  Therapy: WFL for tasks assessed/performed Overall Cognitive Status: Within Functional Limits for tasks assessed                                        General Comments      Exercises     Assessment/Plan    PT Assessment Patient needs continued PT services  PT Problem List Decreased strength;Decreased activity tolerance;Decreased mobility       PT Treatment Interventions Gait training;Stair training;Therapeutic activities;Patient/family education;Therapeutic exercise    PT Goals (Current goals can be found in the Care Plan section)  Acute Rehab PT Goals Patient Stated Goal: Return home PT Goal Formulation: With patient Time For Goal Achievement: 06/14/17 Potential to Achieve Goals: Good    Frequency Min 2X/week   Barriers to discharge        Co-evaluation               AM-PAC PT "6 Clicks" Daily Activity  Outcome Measure Difficulty turning over in bed (including adjusting bedclothes, sheets and blankets)?: None Difficulty moving from lying on back to sitting on the side of the bed? : None Difficulty sitting down on and standing up from a chair with arms (e.g., wheelchair, bedside commode, etc,.)?: None Help needed moving to and from a bed to chair (including a wheelchair)?: A Little Help needed walking in hospital room?: A Little Help needed climbing 3-5 steps with a railing? : A Little 6 Click Score: 21    End of Session Equipment Utilized During Treatment: Gait belt;Oxygen Activity Tolerance: Patient tolerated treatment well;Patient limited by fatigue Patient left: in bed;with call bell/phone within reach (seated at bedside) Nurse Communication: Mobility status PT Visit Diagnosis: Unsteadiness on feet (R26.81);Other abnormalities of gait and mobility (R26.89);Muscle weakness (generalized) (M62.81)    Time: 4259-5638 PT Time Calculation (min) (ACUTE ONLY): 37 min   Charges:   PT Evaluation $PT Eval Low Complexity: 1 Low PT  Treatments $Gait Training: 23-37 mins   PT G Codes:        2:00 PM, 2017-06-04 Lonell Grandchild, MPT Physical Therapist with [facility] [phone] office [phone] mobile phone

## 2017-05-14 NOTE — Care Management Note (Signed)
Case Management Note  Patient Details  Name: Clayton [name] MRN: [id number] Date of Birth: 02-24-1951  Subjective/Objective:  Adm with acute on chronic respiratory failure with hypoxia. From home alone, ind PTA. Reports having continuous oxygen setup with [employer]. CM verified with [employer]. Patient has PCP, still drives, reports no issues affording medications.                 Action/Plan:Anticipate DC home with self care. PT eval pending,  However patient is not home bound and is not eligible for home health services.    Expected Discharge Date:      05/15/2017            Expected Discharge Plan:  Home/Self Care  In-House Referral:     Discharge planning Services     Post Acute Care Choice:    Choice offered to:     DME Arranged:    DME Agency:     HH Arranged:    HH Agency:     Status of Service:  In process, will continue to follow  If discussed at Long Length of Stay Meetings, dates discussed:    Additional Comments:  Clayton [name], Chauncey Reading, RN 05/14/2017, 11:09 AM

## 2017-05-14 NOTE — Progress Notes (Signed)
OT Cancellation Note  Patient Details Name: Clayton [name] MRN: [id number] DOB: Nov 01, 1950   Cancelled Treatment:    Reason Eval/Treat Not Completed: OT screened, no needs identified, will sign off. Pt screened, demonstrates independence in ADL completion. Educated on energy conservation strategies for work and home. No further OT needs at this time.   Guadelupe Sabin, OTR/L  [phone] 05/14/2017, 9:06 AM

## 2017-05-15 LAB — HIV ANTIBODY (ROUTINE TESTING W REFLEX): HIV SCREEN 4TH GENERATION: NONREACTIVE

## 2017-05-15 LAB — GLUCOSE, CAPILLARY
GLUCOSE-CAPILLARY: 250 mg/dL — AB (ref 65–99)
GLUCOSE-CAPILLARY: 293 mg/dL — AB (ref 65–99)
Glucose-Capillary: 285 mg/dL — ABNORMAL HIGH (ref 65–99)
Glucose-Capillary: 261 mg/dL — ABNORMAL HIGH (ref 65–99)

## 2017-05-15 MED ORDER — INSULIN ASPART 100 UNIT/ML ~~LOC~~ SOLN
4.0000 [IU] | Freq: Three times a day (TID) | SUBCUTANEOUS | Status: DC
  Administered 2017-05-15 – 2017-05-17 (×5): 4 [IU] via SUBCUTANEOUS

## 2017-05-15 MED ORDER — PNEUMOCOCCAL VAC POLYVALENT 25 MCG/0.5ML IJ INJ: 0.5000 mL | INJECTION | INTRAMUSCULAR | Status: DC

## 2017-05-15 NOTE — Progress Notes (Signed)
PROGRESS NOTE    Clayton [name]  MWN:[id number] DOB: [birth year] DOA: 05/13/2017 PCP: Dione Housekeeper, MD    Brief Narrative:   patient is a 66 yo active smoker with hx of COPD on home oxygen, admitted for COPD exacerbation.  He was seen in consultation with Dr Luan Pulling, and is currently getting IV steroids and nebs along with IV Doxycycline.  He is feeling just a little better.  He is still wheezing and is not ready for discharge as yet.   BS went up due to steroid.  He is better but is still having significant DOE.      Assessment & Plan:   Principal Problem:   Acute on chronic respiratory failure with hypoxia (HCC) Active Problems:   TOBACCO ABUSE   COPD exacerbation (HCC)   DM type 2 (diabetes mellitus, type 2) (HCC)   Elevated troponin  1. COPD exacerbation:  Continue with IV steroids, nebs, and antibiotics.  He will need to be on an inhaler per Dr Luan Pulling.  I will put him on Advair upon discharge.   He is still not ready for discharge due to significant DOE and moderate wheezing 2. Tobacco abuse:  He actually doesn't want to quit cigarettes.  He doesn't want to nicotine patch I offered him. 3. DM:  Continue to monitor.  Will add meal coverage.  4. Elevated troponins:  Doubt ACS.  Will follow.    DVT prophylaxis: Lovenox.  Code Status: FULL CODE.  Family Communication: None.  Disposition Plan: Home.   Consultants:   Pulmonary.   Procedures:   FULL CODE.   Antimicrobials: Anti-infectives    Start     Dose/Rate Route Frequency Ordered Stop   05/13/17 2200  doxycycline (VIBRAMYCIN) 100 mg in dextrose 5 % 250 mL IVPB     100 mg 125 mL/hr over 120 Minutes Intravenous Every 12 hours 05/13/17 2053     05/13/17 1800  levofloxacin (LEVAQUIN) IVPB 750 mg     750 mg 100 mL/hr over 90 Minutes Intravenous  Once 05/13/17 1749 05/13/17 1940       Subjective: SOB with walking to the bathroom.   Objective: Vitals:   05/14/17 2055 05/14/17 2106 05/15/17 0539 05/15/17 0740   BP: (!) 171/68  111/72   Pulse: 81  70   Resp: 20  20   Temp: 99 F (37.2 C)  98.7 F (37.1 C)   TempSrc: Oral  Oral   SpO2: 95% 98% 100% 91%  Weight:      Height:        Intake/Output Summary (Last 24 hours) at 05/15/17 1304 Last data filed at 05/15/17 0641  Gross per 24 hour  Intake              740 ml  Output              700 ml  Net               40 ml   Filed Weights   05/13/17 1650  Weight: 93 kg (205 lb)    Examination:  General exam: Appears calm and comfortable  Respiratory system:  Diffuse wheezing.  A little less than yesterday.  Cardiovascular system: S1 & S2 heard, RRR. No JVD, murmurs, rubs, gallops or clicks. No pedal edema. Gastrointestinal system: Abdomen is nondistended, soft and nontender. No organomegaly or masses felt. Normal bowel sounds heard. Central nervous system: Alert and oriented. No focal neurological deficits. Extremities: Symmetric 5 x 5 power. Skin: No rashes,  lesions or ulcers Psychiatry: Judgement and insight appear normal. Mood & affect appropriate.   Data Reviewed: I have personally reviewed following labs and imaging studies  CBC:  Recent Labs Lab 05/13/17 1710 05/14/17 0320  WBC 5.1 3.0*  NEUTROABS 3.9  --   HGB 13.5 13.6  HCT 42.0 42.7  MCV 94.2 94.5  PLT 172 884   Basic Metabolic Panel:  Recent Labs Lab 05/13/17 1710 05/14/17 0320  NA 137 135  K 4.2 4.6  CL 100* 99*  CO2 32 30  GLUCOSE 171* 231*  BUN 9 9  CREATININE 0.63 0.62  CALCIUM 8.9 8.6*   GFR: Estimated Creatinine Clearance: 102.4 mL/min (by C-G formula based on SCr of 0.62 mg/dL). Liver Function Tests:  Recent Labs Lab 05/13/17 1710  AST 25  ALT 25  ALKPHOS 109  BILITOT 1.6*  PROT 7.7  ALBUMIN 4.1   Cardiac Enzymes:  Recent Labs Lab 05/13/17 1747 05/13/17 2125 05/14/17 0320 05/14/17 0900  TROPONINI 0.03* 0.03* <0.03 <0.03   HbA1C:  Recent Labs  05/13/17 1710  HGBA1C 6.4*   CBG:  Recent Labs Lab 05/14/17 1111  05/14/17 1619 05/14/17 2058 05/15/17 0733 05/15/17 1129  GLUCAP 245* 294* 242* 285* 250*   Lipid Profile: Sepsis Labs:  Recent Labs Lab 05/13/17 1714  LATICACIDVEN 0.70    Recent Results (from the past 240 hour(s))  Blood culture (routine x 2)     Status: None (Preliminary result)   Collection Time: 05/13/17  5:10 PM  Result Value Ref Range Status   Specimen Description BLOOD RIGHT FOREARM  Final   Special Requests   Final    BOTTLES DRAWN AEROBIC AND ANAEROBIC Blood Culture adequate volume   Culture NO GROWTH 2 DAYS  Final   Report Status PENDING  Incomplete  Blood culture (routine x 2)     Status: None (Preliminary result)   Collection Time: 05/13/17  5:10 PM  Result Value Ref Range Status   Specimen Description BLOOD LEFT FOREARM  Final   Special Requests   Final    BOTTLES DRAWN AEROBIC AND ANAEROBIC Blood Culture results may not be optimal due to an inadequate volume of blood received in culture bottles   Culture NO GROWTH 2 DAYS  Final   Report Status PENDING  Incomplete     Radiology Studies: Dg Chest 2 View  Result Date: 05/13/2017 CLINICAL DATA:  Cough, hypoxia EXAM: CHEST  2 VIEW COMPARISON:  06/27/2015 FINDINGS: Heart is borderline in size. Mild interstitial prominence throughout the lungs could reflect interstitial edema or interstitial pneumonitis. No effusions. No acute bony abnormality. IMPRESSION: Borderline heart size. Interstitial prominence throughout the lungs, question interstitial edema or interstitial pneumonitis. Electronically Signed   By: Rolm Baptise M.D.   On: 05/13/2017 17:38    Scheduled Meds: . aspirin EC  81 mg Oral Daily  . carvedilol  6.25 mg Oral BID  . docusate sodium  100 mg Oral BID  . enoxaparin (LOVENOX) injection  40 mg Subcutaneous Q24H  . insulin aspart  0-15 Units Subcutaneous TID WC  . insulin aspart  4 Units Subcutaneous TID WC  . ipratropium-albuterol  3 mL Nebulization TID  . lisinopril  40 mg Oral QHS  . mouth rinse   15 mL Mouth Rinse BID  . methylPREDNISolone (SOLU-MEDROL) injection  80 mg Intravenous Q12H  . nicotine  14 mg Transdermal Daily  . [START ON 05/16/2017] pneumococcal 23 valent vaccine  0.5 mL Intramuscular Tomorrow-1000  . pravastatin  40 mg Oral  QPM   Continuous Infusions: . doxycycline (VIBRAMYCIN) IV 100 mg (05/15/17 1141)     LOS: 2 days   Leland Raver, MD FACP Hospitalist.   If 7PM-7AM, please contact night-coverage [email or web address] Password [email or web address] 05/15/2017, 1:04 PM

## 2017-05-15 NOTE — Progress Notes (Signed)
Physical Therapy Treatment Patient Details Name: Clayton [name] MRN: [id number] DOB: [birth year] Today's Date: 05/15/2017    History of Present Illness  Clayton [name] is a 66 y.o. male with medical history significant of remote prostate cancer; remote CAD; COPD on home O2; DM; HLD; HTN; and GERD presenting because "I got a little sick".  He reports that he "got weak".  He was also having a lot of trouble breathing.  He started feeling bad over the weekend.  He had no energy, "no get up and go".  Started feeling SOB about the same time.  +wheezing.  Does not know of a fever.  Wears O2 at home, has no idea how much.  Was SOB with walking around.  He saw his PCP today and was sent here.  +cough, productive of yellow-green sputum.  His sister reports that his O2 sats dropped in the low 80s with minimal ambulation.    PT Comments    Patient demonstrates good return for ambulation in hallways Independently > 150 feet and up/down 18 steps without loss of balance.  Patient O2 sats dropped to 80% after going up stairs, able to return above 90% after pursed lip breathing and patient advised to bring supplemental O2 when he goes to work or out in community after discharge.    Follow Up Recommendations  No PT follow up     Equipment Recommendations  None recommended by PT    Recommendations for Other Services       Precautions / Restrictions Precautions Precautions: Other (comment) Precaution Comments: Supplemental O2 dependent Restrictions Weight Bearing Restrictions: No    Mobility  Bed Mobility Overal bed mobility: Independent Bed Mobility: Sidelying to Sit;Supine to Sit   Sidelying to sit: Independent Supine to sit: Independent        Transfers Overall transfer level: Independent Equipment used: None Transfers: Sit to/from [employer] to Stand: Independent Stand pivot transfers: Independent          Ambulation/Gait Ambulation/Gait assistance: Modified  independent (Device/Increase time);Independent Ambulation Distance (Feet): 175 Feet Assistive device: None Gait Pattern/deviations: WFL(Within Functional Limits)   Gait velocity interpretation: at or above normal speed for age/gender General Gait Details: able to ambulate in room, hallways [facility]   Stairs Stairs: Yes   Stair Management: One rail Right Number of Stairs: 18 General stair comments: Required rest break at top of stairs due to O2 desaturation to 80%, returned to 93% after pursed lip breathing without putting on supplemental O2  Wheelchair Mobility    Modified Rankin (Stroke Patients Only)       Balance Overall balance assessment: Independent Sitting-balance support: No upper extremity supported;Feet unsupported;Feet supported Sitting balance-Leahy Scale: Normal     Standing balance support: No upper extremity supported Standing balance-Leahy Scale: Good                              Cognition Arousal/Alertness: Awake/alert Behavior During Therapy: WFL for tasks assessed/performed Overall Cognitive Status: Within Functional Limits for tasks assessed                                        Exercises      General Comments        Pertinent Vitals/Pain Pain Assessment: No/denies pain    Home Living  Prior Function            PT Goals (current goals can now be found in the care plan section) Acute Rehab PT Goals Patient Stated Goal: Return home at [facility] PT Goal Formulation: With patient Time For Goal Achievement: 2017/06/08 Potential to Achieve Goals: Good Progress towards PT goals: Goals met/education completed, patient discharged from PT    Frequency           PT Plan Discharge patient to care of nursing for ambulation daily as tolerated secondary to goals met. Patient educated to use O2 as needed during longer periods of walking.    Co-evaluation              AM-PAC PT "6  Clicks" Daily Activity  Outcome Measure  Difficulty turning over in bed (including adjusting bedclothes, sheets and blankets)?: None Difficulty moving from lying on back to sitting on the side of the bed? : None Difficulty sitting down on and standing up from a chair with arms (e.g., wheelchair, bedside commode, etc,.)?: None Help needed moving to and from a bed to chair (including a wheelchair)?: None Help needed walking in hospital room?: None Help needed climbing 3-5 steps with a railing? : None 6 Click Score: 24    End of Session Equipment Utilized During Treatment: Gait belt;Oxygen Activity Tolerance: Patient tolerated treatment well Patient left: in bed (seated at bedside) Nurse Communication: Mobility status PT Visit Diagnosis: Unsteadiness on feet (R26.81);Other abnormalities of gait and mobility (R26.89);Muscle weakness (generalized) (M62.81)     Time: 3276-1470 PT Time Calculation (min) (ACUTE ONLY): 30 min  Charges:  $Gait Training: 23-37 mins                    G Codes:       1:17 PM, 06/08/2017 Lonell Grandchild, MPT Physical Therapist with [facility] [phone] office [phone] mobile phone

## 2017-05-15 NOTE — Progress Notes (Signed)
Subjective: He says he feels better. He is still short of breath with exertion and says he had to lean up against the wall when he was coming back from the bathroom. He's coughing nonproductively. He feels better but not back to baseline  Objective: Vital signs in last 24 hours: Temp:  [97.8 F (36.6 C)-99 F (37.2 C)] 98.7 F (37.1 C) (08/30 0539) Pulse Rate:  [70-81] 70 (08/30 0539) Resp:  [17-20] 20 (08/30 0539) BP: (111-171)/(68-80) 111/72 (08/30 0539) SpO2:  [88 %-100 %] 91 % (08/30 0740) Weight change:  Last BM Date: 05/14/17  Intake/Output from previous day: 08/29 0701 - 08/30 0700 In: 740 [P.O.:240; IV Piggyback:500] Out: 700 [Urine:700]  PHYSICAL EXAM General appearance: alert, cooperative and no distress Resp: rhonchi bilaterally and wheezes bilaterally Cardio: regular rate and rhythm, S1, S2 normal, no murmur, click, rub or gallop GI: soft, non-tender; bowel sounds normal; no masses,  no organomegaly Extremities: extremities normal, atraumatic, no cyanosis or edema Skin warm and dry  Lab Results:  Results for orders placed or performed during the hospital encounter of 05/13/17 (from the past 48 hour(s))  Comprehensive metabolic panel     Status: Abnormal   Collection Time: 05/13/17  5:10 PM  Result Value Ref Range   Sodium 137 135 - 145 mmol/L   Potassium 4.2 3.5 - 5.1 mmol/L   Chloride 100 (L) 101 - 111 mmol/L   CO2 32 22 - 32 mmol/L   Glucose, Bld 171 (H) 65 - 99 mg/dL   BUN 9 6 - 20 mg/dL   Creatinine, Ser 0.63 0.61 - 1.24 mg/dL   Calcium 8.9 8.9 - 10.3 mg/dL   Total Protein 7.7 6.5 - 8.1 g/dL   Albumin 4.1 3.5 - 5.0 g/dL   AST 25 15 - 41 U/L   ALT 25 17 - 63 U/L   Alkaline Phosphatase 109 38 - 126 U/L   Total Bilirubin 1.6 (H) 0.3 - 1.2 mg/dL   GFR calc non Af Amer >60 >60 mL/min   GFR calc Af Amer >60 >60 mL/min    Comment: (NOTE) The eGFR has been calculated using the CKD EPI equation. This calculation has not been validated in all clinical  situations. eGFR's persistently <60 mL/min signify possible Chronic Kidney Disease.    Anion gap 5 5 - 15  CBC with Differential     Status: Abnormal   Collection Time: 05/13/17  5:10 PM  Result Value Ref Range   WBC 5.1 4.0 - 10.5 K/uL   RBC 4.46 4.22 - 5.81 MIL/uL   Hemoglobin 13.5 13.0 - 17.0 g/dL   HCT 42.0 39.0 - 52.0 %   MCV 94.2 78.0 - 100.0 fL   MCH 30.3 26.0 - 34.0 pg   MCHC 32.1 30.0 - 36.0 g/dL   RDW 13.5 11.5 - 15.5 %   Platelets 172 150 - 400 K/uL   Neutrophils Relative % 76 %   Neutro Abs 3.9 1.7 - 7.7 K/uL   Lymphocytes Relative 8 %   Lymphs Abs 0.4 (L) 0.7 - 4.0 K/uL   Monocytes Relative 15 %   Monocytes Absolute 0.8 0.1 - 1.0 K/uL   Eosinophils Relative 1 %   Eosinophils Absolute 0.0 0.0 - 0.7 K/uL   Basophils Relative 0 %   Basophils Absolute 0.0 0.0 - 0.1 K/uL  Blood culture (routine x 2)     Status: None (Preliminary result)   Collection Time: 05/13/17  5:10 PM  Result Value Ref Range   Specimen  Description BLOOD RIGHT FOREARM    Special Requests      BOTTLES DRAWN AEROBIC AND ANAEROBIC Blood Culture adequate volume   Culture NO GROWTH 2 DAYS    Report Status PENDING   Blood culture (routine x 2)     Status: None (Preliminary result)   Collection Time: 05/13/17  5:10 PM  Result Value Ref Range   Specimen Description BLOOD LEFT FOREARM    Special Requests      BOTTLES DRAWN AEROBIC AND ANAEROBIC Blood Culture results may not be optimal due to an inadequate volume of blood received in culture bottles   Culture NO GROWTH 2 DAYS    Report Status PENDING   HIV antibody (Routine Testing)     Status: None   Collection Time: 05/13/17  5:10 PM  Result Value Ref Range   HIV Screen 4th Generation wRfx Non Reactive Non Reactive    Comment: (NOTE) Performed At: [facility] [address] [place], [place] [id number] Lindon Romp MD HU:[id number]   Hemoglobin A1c     Status: Abnormal   Collection Time: 05/13/17  5:10 PM  Result Value Ref Range    Hgb A1c MFr Bld 6.4 (H) 4.8 - 5.6 %    Comment: (NOTE) Pre diabetes:          5.7%-6.4% Diabetes:              >6.4% Glycemic control for   <7.0% adults with diabetes    Mean Plasma Glucose 136.98 mg/dL    Comment: Performed at [facility], [place] [address] [place], [place] [address]  I-Stat CG4 Lactic Acid, ED     Status: None   Collection Time: 05/13/17  5:14 PM  Result Value Ref Range   Lactic Acid, Venous 0.70 0.5 - 1.9 mmol/L  Brain natriuretic peptide     Status: Abnormal   Collection Time: 05/13/17  5:47 PM  Result Value Ref Range   B Natriuretic Peptide 337.0 (H) 0.0 - 100.0 pg/mL  Troponin I     Status: Abnormal   Collection Time: 05/13/17  5:47 PM  Result Value Ref Range   Troponin I 0.03 (HH) <0.03 ng/mL    Comment: CRITICAL RESULT CALLED TO, READ BACK BY AND VERIFIED WITH: EDWARDS,C AT 1820 ON 8.28.2018 BY ISLEY,B   Troponin I     Status: Abnormal   Collection Time: 05/13/17  9:25 PM  Result Value Ref Range   Troponin I 0.03 (HH) <0.03 ng/mL    Comment: CRITICAL VALUE NOTED.  VALUE IS CONSISTENT WITH PREVIOUSLY REPORTED AND CALLED VALUE.  Basic metabolic panel     Status: Abnormal   Collection Time: 05/14/17  3:20 AM  Result Value Ref Range   Sodium 135 135 - 145 mmol/L   Potassium 4.6 3.5 - 5.1 mmol/L   Chloride 99 (L) 101 - 111 mmol/L   CO2 30 22 - 32 mmol/L   Glucose, Bld 231 (H) 65 - 99 mg/dL   BUN 9 6 - 20 mg/dL   Creatinine, Ser 0.62 0.61 - 1.24 mg/dL   Calcium 8.6 (L) 8.9 - 10.3 mg/dL   GFR calc non Af Amer >60 >60 mL/min   GFR calc Af Amer >60 >60 mL/min    Comment: (NOTE) The eGFR has been calculated using the CKD EPI equation. This calculation has not been validated in all clinical situations. eGFR's persistently <60 mL/min signify possible Chronic Kidney Disease.    Anion gap 6 5 - 15  CBC  Status: Abnormal   Collection Time: 05/14/17  3:20 AM  Result Value Ref Range   WBC 3.0 (L) 4.0 - 10.5 K/uL   RBC 4.52 4.22 - 5.81 MIL/uL    Hemoglobin 13.6 13.0 - 17.0 g/dL   HCT 42.7 39.0 - 52.0 %   MCV 94.5 78.0 - 100.0 fL   MCH 30.1 26.0 - 34.0 pg   MCHC 31.9 30.0 - 36.0 g/dL   RDW 13.3 11.5 - 15.5 %   Platelets 160 150 - 400 K/uL  Troponin I     Status: None   Collection Time: 05/14/17  3:20 AM  Result Value Ref Range   Troponin I <0.03 <0.03 ng/mL  Glucose, capillary     Status: Abnormal   Collection Time: 05/14/17  7:34 AM  Result Value Ref Range   Glucose-Capillary 225 (H) 65 - 99 mg/dL   Comment 1 Notify RN    Comment 2 Document in Chart   Troponin I     Status: None   Collection Time: 05/14/17  9:00 AM  Result Value Ref Range   Troponin I <0.03 <0.03 ng/mL  Glucose, capillary     Status: Abnormal   Collection Time: 05/14/17 11:11 AM  Result Value Ref Range   Glucose-Capillary 245 (H) 65 - 99 mg/dL   Comment 1 Notify RN    Comment 2 Document in Chart   Glucose, capillary     Status: Abnormal   Collection Time: 05/14/17  4:19 PM  Result Value Ref Range   Glucose-Capillary 294 (H) 65 - 99 mg/dL   Comment 1 Notify RN    Comment 2 Document in Chart   Glucose, capillary     Status: Abnormal   Collection Time: 05/14/17  8:58 PM  Result Value Ref Range   Glucose-Capillary 242 (H) 65 - 99 mg/dL   Comment 1 Notify RN    Comment 2 Document in Chart   Glucose, capillary     Status: Abnormal   Collection Time: 05/15/17  7:33 AM  Result Value Ref Range   Glucose-Capillary 285 (H) 65 - 99 mg/dL   Comment 1 Notify RN    Comment 2 Document in Chart     ABGS No results for input(s): PHART, PO2ART, TCO2, HCO3 in the last 72 hours.  Invalid input(s): PCO2 CULTURES Recent Results (from the past 240 hour(s))  Blood culture (routine x 2)     Status: None (Preliminary result)   Collection Time: 05/13/17  5:10 PM  Result Value Ref Range Status   Specimen Description BLOOD RIGHT FOREARM  Final   Special Requests   Final    BOTTLES DRAWN AEROBIC AND ANAEROBIC Blood Culture adequate volume   Culture NO GROWTH 2  DAYS  Final   Report Status PENDING  Incomplete  Blood culture (routine x 2)     Status: None (Preliminary result)   Collection Time: 05/13/17  5:10 PM  Result Value Ref Range Status   Specimen Description BLOOD LEFT FOREARM  Final   Special Requests   Final    BOTTLES DRAWN AEROBIC AND ANAEROBIC Blood Culture results may not be optimal due to an inadequate volume of blood received in culture bottles   Culture NO GROWTH 2 DAYS  Final   Report Status PENDING  Incomplete   Studies/Results: Dg Chest 2 View  Result Date: 05/13/2017 CLINICAL DATA:  Cough, hypoxia EXAM: CHEST  2 VIEW COMPARISON:  06/27/2015 FINDINGS: Heart is borderline in size. Mild interstitial prominence throughout the lungs  could reflect interstitial edema or interstitial pneumonitis. No effusions. No acute bony abnormality. IMPRESSION: Borderline heart size. Interstitial prominence throughout the lungs, question interstitial edema or interstitial pneumonitis. Electronically Signed   By: Rolm Baptise M.D.   On: 05/13/2017 17:38    Medications:  Prior to Admission:  Prescriptions Prior to Admission  Medication Sig Dispense Refill Last Dose  . albuterol (PROVENTIL HFA;VENTOLIN HFA) 108 (90 BASE) MCG/ACT inhaler Inhale 2 puffs into the lungs every 6 (six) hours as needed for wheezing or shortness of breath. Please instruct in proper technique. 1 Inhaler 1 unknown  . aspirin 81 MG tablet Take 81 mg by mouth daily.   05/12/2017 at Unknown time  . carvedilol (COREG) 6.25 MG tablet Take 1 tablet by mouth 2 (two) times daily.   05/13/2017 at Unknown time  . ferrous sulfate 325 (65 FE) MG tablet Take 325 mg by mouth daily with breakfast.    05/13/2017 at Unknown time  . glipiZIDE (GLUCOTROL) 10 MG tablet Take 10 mg by mouth 2 (two) times daily.   05/12/2017 at Unknown time  . metFORMIN (GLUCOPHAGE) 1000 MG tablet Take 1 tablet by mouth 2 (two) times daily.   05/13/2017 at Unknown time  . omeprazole (PRILOSEC) 20 MG capsule Take 20 mg by  mouth daily as needed (indigestion).    unknown  . pravastatin (PRAVACHOL) 40 MG tablet Take 1 tablet by mouth every evening.    05/12/2017 at Unknown time  . quinapril (ACCUPRIL) 40 MG tablet Take 40 mg by mouth at bedtime.   05/12/2017 at Unknown time  . Saw [place]metto 1000 MG CAPS Take 1,000 mg by mouth 2 (two) times daily.    05/13/2017 at Unknown time  . sildenafil (REVATIO) 20 MG tablet Take 2-5 tablets by mouth daily as needed (intercourse).    unknown   Scheduled: . aspirin EC  81 mg Oral Daily  . carvedilol  6.25 mg Oral BID  . docusate sodium  100 mg Oral BID  . enoxaparin (LOVENOX) injection  40 mg Subcutaneous Q24H  . insulin aspart  0-15 Units Subcutaneous TID WC  . ipratropium-albuterol  3 mL Nebulization TID  . lisinopril  40 mg Oral QHS  . mouth rinse  15 mL Mouth Rinse BID  . methylPREDNISolone (SOLU-MEDROL) injection  80 mg Intravenous Q12H  . nicotine  14 mg Transdermal Daily  . [START ON 05/16/2017] pneumococcal 23 valent vaccine  0.5 mL Intramuscular Tomorrow-1000  . pravastatin  40 mg Oral QPM   Continuous: . doxycycline (VIBRAMYCIN) IV Stopped (05/14/17 2315)   MEQ:[id number] **OR** acetaminophen, albuterol, ondansetron **OR** ondansetron (ZOFRAN) IV  Assesment: He was admitted with COPD exacerbation and acute on chronic hypoxic respiratory failure. He is better but doesn't feel like he is back to baseline yet. He is on IV steroids inhaled bronchodilators on antibiotics. Principal Problem:   Acute on chronic respiratory failure with hypoxia (HCC) Active Problems:   TOBACCO ABUSE   COPD exacerbation (HCC)   DM type 2 (diabetes mellitus, type 2) (HCC)   Elevated troponin    Plan: Continue current treatments.    LOS: 2 days   Clayton [name] L 05/15/2017, 8:36 AM

## 2017-05-15 NOTE — Progress Notes (Signed)
Inpatient Diabetes Program Recommendations  AACE/ADA: New Consensus Statement on Inpatient Glycemic Control (2015)  Target Ranges:  Prepandial:   less than 140 mg/dL      Peak postprandial:   less than 180 mg/dL (1-2 hours)      Critically ill patients:  140 - 180 mg/dL   Results for Clayton [name], Clayton [name] (MRN [id number]) as of 05/15/2017 09:24  Ref. Range 05/14/2017 07:34 05/14/2017 11:11 05/14/2017 16:19 05/14/2017 20:58 05/15/2017 07:33  Glucose-Capillary Latest Ref Range: 65 - 99 mg/dL 225 (H) 245 (H) 294 (H) 242 (H) 285 (H)   Review of Glycemic Control  Current orders for Inpatient glycemic control: Novolog 0-15 units TID with meals  Inpatient Diabetes Program Recommendations: Insulin - Basal: If steroids are continued, please consider ordering Lantus 10 units daily starting now. Correction (SSI): Please consider ordering Novolog 0-5 units QHS for bedtime correction. Insulin - Meal Coverage: If steroids are continued and patient is eating at least 50% of meals, please consider ordering Novolog 4 units TID with meals if patient eats at least 50% of meals.  NOTE: Currently ordered Solumedrol 80 mg Q12H which is contributing to hyperglycemia.  Thanks, Barnie Alderman, RN, MSN, CDE Diabetes Coordinator Inpatient Diabetes Program [phone] (Team Pager from 8am to 5pm)

## 2017-05-16 LAB — GLUCOSE, CAPILLARY
GLUCOSE-CAPILLARY: 244 mg/dL — AB (ref 65–99)
GLUCOSE-CAPILLARY: 272 mg/dL — AB (ref 65–99)
Glucose-Capillary: 270 mg/dL — ABNORMAL HIGH (ref 65–99)
Glucose-Capillary: 271 mg/dL — ABNORMAL HIGH (ref 65–99)

## 2017-05-16 MED ORDER — PNEUMOCOCCAL VAC POLYVALENT 25 MCG/0.5ML IJ INJ
0.5000 mL | INJECTION | INTRAMUSCULAR | Status: AC
  Administered 2017-05-17: 0.5 mL via INTRAMUSCULAR
  Filled 2017-05-16: qty 0.5

## 2017-05-16 NOTE — Progress Notes (Signed)
Inpatient Diabetes Program Recommendations  AACE/ADA: New Consensus Statement on Inpatient Glycemic Control (2015)  Target Ranges:  Prepandial:   less than 140 mg/dL      Peak postprandial:   less than 180 mg/dL (1-2 hours)      Critically ill patients:  140 - 180 mg/dL   Results for JOSEAN, LYCAN (MRN [id number]) as of 05/16/2017 12:30  Ref. Range 05/15/2017 07:33 05/15/2017 11:29 05/15/2017 16:07 05/15/2017 21:01  Glucose-Capillary Latest Ref Range: 65 - 99 mg/dL 285 (H) 250 (H) 293 (H) 261 (H)   Results for ALMIN, LIVINGSTONE (MRN [id number]) as of 05/16/2017 12:30  Ref. Range 05/16/2017 07:48 05/16/2017 11:39  Glucose-Capillary Latest Ref Range: 65 - 99 mg/dL 244 (H) 270 (H)    Home DM Meds: Metformin 1000 mg BID       Glipizide 10 mg BID  Current Insulin Orders: Novolog Moderate Correction Scale/ SSI (0-15 units) TID AC      Novolog 4 units TID       MD- Note patient receiving Solumedrol 80 mg BID.  CBGs consistently >200 mg/dl.  Please consider the following insulin adjustments while patient getting steroids:  Start Lantus 14 units daily (0.15 units/kg dosing)    --Will follow patient during hospitalization--  Wyn Quaker RN, MSN, CDE Diabetes Coordinator Inpatient Glycemic Control Team Team Pager: [phone] (8a-5p)

## 2017-05-16 NOTE — Progress Notes (Signed)
PROGRESS NOTE    Clayton [name]  VQQ:[id number] DOB: Feb 09, 1951 DOA: 05/13/2017 PCP: Dione Housekeeper, MD    Brief Narrative:  patient is a 66 yo active smoker with hx of COPD on home oxygen, admitted for COPD exacerbation. He was seen in consultation with Dr Luan Pulling, and is currently getting IV steroids and nebs along with IV Doxycycline. He is feeling just a little better.  He has not ambulate as yet.    BS went up due to steroid.  He is better but is still having significant DOE.     Assessment & Plan:   Principal Problem:   Acute on chronic respiratory failure with hypoxia (HCC) Active Problems:   TOBACCO ABUSE   COPD exacerbation (HCC)   DM type 2 (diabetes mellitus, type 2) (HCC)   Elevated troponin   1. COPD exacerbation: Continue with IV steroids, nebs, and antibiotics. He will need to be on an inhaler per Dr Luan Pulling. I will put him on Advair upon discharge. He is still not ready for discharge due to significant DOE and moderate wheezing.  No new event.  Will increase ambulation.  2. Tobacco abuse: He actually doesn't want to quit cigarettes. He doesn't want to nicotine patch I offered him. 3. DM: Continue to monitor.  Will add meal coverage.  4. Elevated troponins: Doubt ACS. Will follow.   DVT prophylaxis: Lovenox.  Code Status: FULL CODE.  Family Communication: None.  Disposition Plan: Home.   Consultants:   Pulmonary.   Procedures:   FULL CODE.   Antimicrobials: Anti-infectives    Start     Dose/Rate Route Frequency Ordered Stop   05/13/17 2200  doxycycline (VIBRAMYCIN) 100 mg in dextrose 5 % 250 mL IVPB     100 mg 125 mL/hr over 120 Minutes Intravenous Every 12 hours 05/13/17 2053     05/13/17 1800  levofloxacin (LEVAQUIN) IVPB 750 mg     750 mg 100 mL/hr over 90 Minutes Intravenous  Once 05/13/17 1749 05/13/17 1940       Subjective:   Wants to go home.   Objective: Vitals:   05/15/17 1928 05/15/17 2149 05/16/17 0507 05/16/17 0737    BP:  (!) 146/64 (!) 162/85   Pulse:  76 68   Resp:  18 18   Temp:  98.9 F (37.2 C) 98.6 F (37 C)   TempSrc:  Oral Oral   SpO2: 94% 96% 99% (!) 89%  Weight:      Height:        Intake/Output Summary (Last 24 hours) at 05/16/17 0835 Last data filed at 05/16/17 4332  Gross per 24 hour  Intake             1460 ml  Output             1000 ml  Net              460 ml   Filed Weights   05/13/17 1650  Weight: 93 kg (205 lb)    Examination:  General exam: Appears calm and comfortable  Respiratory system: Still wheezing bilaterally.  Respiratory effort normal. Cardiovascular system: S1 & S2 heard, RRR. No JVD, murmurs, rubs, gallops or clicks. No pedal edema. Gastrointestinal system: Abdomen is nondistended, soft and nontender. No organomegaly or masses felt. Normal bowel sounds heard. Central nervous system: Alert and oriented. No focal neurological deficits. Extremities: Symmetric 5 x 5 power. Skin: No rashes, lesions or ulcers Psychiatry: Judgement and insight appear normal. Mood & affect appropriate.  Data Reviewed: I have personally reviewed following labs and imaging studies  CBC:  Recent Labs Lab 05/13/17 1710 05/14/17 0320  WBC 5.1 3.0*  NEUTROABS 3.9  --   HGB 13.5 13.6  HCT 42.0 42.7  MCV 94.2 94.5  PLT 172 833   Basic Metabolic Panel:  Recent Labs Lab 05/13/17 1710 05/14/17 0320  NA 137 135  K 4.2 4.6  CL 100* 99*  CO2 32 30  GLUCOSE 171* 231*  BUN 9 9  CREATININE 0.63 0.62  CALCIUM 8.9 8.6*   GFR: Estimated Creatinine Clearance: 102.4 mL/min (by C-G formula based on SCr of 0.62 mg/dL). Liver Function Tests:  Recent Labs Lab 05/13/17 1710  AST 25  ALT 25  ALKPHOS 109  BILITOT 1.6*  PROT 7.7  ALBUMIN 4.1   Cardiac Enzymes:  Recent Labs Lab 05/13/17 1747 05/13/17 2125 05/14/17 0320 05/14/17 0900  TROPONINI 0.03* 0.03* <0.03 <0.03    Recent Labs  05/13/17 1710  HGBA1C 6.4*   CBG:  Recent Labs Lab 05/15/17 0733  05/15/17 1129 05/15/17 1607 05/15/17 2101 05/16/17 0748  GLUCAP 285* 250* 293* 261* 244*   Lipid Profile: Sepsis Labs:  Recent Labs Lab 05/13/17 1714  LATICACIDVEN 0.70    Recent Results (from the past 240 hour(s))  Blood culture (routine x 2)     Status: None (Preliminary result)   Collection Time: 05/13/17  5:10 PM  Result Value Ref Range Status   Specimen Description BLOOD RIGHT FOREARM  Final   Special Requests   Final    BOTTLES DRAWN AEROBIC AND ANAEROBIC Blood Culture adequate volume   Culture NO GROWTH 3 DAYS  Final   Report Status PENDING  Incomplete  Blood culture (routine x 2)     Status: None (Preliminary result)   Collection Time: 05/13/17  5:10 PM  Result Value Ref Range Status   Specimen Description BLOOD LEFT FOREARM  Final   Special Requests   Final    BOTTLES DRAWN AEROBIC AND ANAEROBIC Blood Culture results may not be optimal due to an inadequate volume of blood received in culture bottles   Culture NO GROWTH 3 DAYS  Final   Report Status PENDING  Incomplete     Radiology Studies: No results found.  Scheduled Meds: . aspirin EC  81 mg Oral Daily  . carvedilol  6.25 mg Oral BID  . docusate sodium  100 mg Oral BID  . enoxaparin (LOVENOX) injection  40 mg Subcutaneous Q24H  . insulin aspart  0-15 Units Subcutaneous TID WC  . insulin aspart  4 Units Subcutaneous TID WC  . ipratropium-albuterol  3 mL Nebulization TID  . lisinopril  40 mg Oral QHS  . mouth rinse  15 mL Mouth Rinse BID  . methylPREDNISolone (SOLU-MEDROL) injection  80 mg Intravenous Q12H  . nicotine  14 mg Transdermal Daily  . pneumococcal 23 valent vaccine  0.5 mL Intramuscular Tomorrow-1000  . pravastatin  40 mg Oral QPM   Continuous Infusions: . doxycycline (VIBRAMYCIN) IV Stopped (05/16/17 0754)     LOS: 3 days   Delorese Sellin, MD FACP Hospitalist.   If 7PM-7AM, please contact night-coverage [email or web address] Password [facility] 05/16/2017, 8:35 AM

## 2017-05-16 NOTE — Progress Notes (Signed)
Patient was able to ambulated over 200 feet with steady gait and maintain conversation with slight shortness of breath. SpO2 on 3 lpm [place] was 85%  during walk. He was able to recover SpO2  of 90% on 3 lpm [place] once seated.

## 2017-05-16 NOTE — Progress Notes (Signed)
Subjective: He says he feels okay but he is still wheezing. He's coughing a little bit. Still short of breath with exertion.  Objective: Vital signs in last 24 hours: Temp:  [98.6 F (37 C)-98.9 F (37.2 C)] 98.6 F (37 C) (08/31 0507) Pulse Rate:  [68-81] 68 (08/31 0507) Resp:  [18] 18 (08/31 0507) BP: (118-162)/(38-85) 162/85 (08/31 0507) SpO2:  [82 %-99 %] 89 % (08/31 0737) Weight change:  Last BM Date: 05/15/17  Intake/Output from previous day: 08/30 0701 - 08/31 0700 In: 1460 [P.O.:960; IV Piggyback:500] Out: 1000 [Urine:1000]  PHYSICAL EXAM General appearance: alert, cooperative and no distress Resp: wheezes bilaterally Cardio: regular rate and rhythm, S1, S2 normal, no murmur, click, rub or gallop GI: soft, non-tender; bowel sounds normal; no masses,  no organomegaly Extremities: extremities normal, atraumatic, no cyanosis or edema Skin warm and dry  Lab Results:  Results for orders placed or performed during the hospital encounter of 05/13/17 (from the past 48 hour(s))  Troponin I     Status: None   Collection Time: 05/14/17  9:00 AM  Result Value Ref Range   Troponin I <0.03 <0.03 ng/mL  Glucose, capillary     Status: Abnormal   Collection Time: 05/14/17 11:11 AM  Result Value Ref Range   Glucose-Capillary 245 (H) 65 - 99 mg/dL   Comment 1 Notify RN    Comment 2 Document in Chart   Glucose, capillary     Status: Abnormal   Collection Time: 05/14/17  4:19 PM  Result Value Ref Range   Glucose-Capillary 294 (H) 65 - 99 mg/dL   Comment 1 Notify RN    Comment 2 Document in Chart   Glucose, capillary     Status: Abnormal   Collection Time: 05/14/17  8:58 PM  Result Value Ref Range   Glucose-Capillary 242 (H) 65 - 99 mg/dL   Comment 1 Notify RN    Comment 2 Document in Chart   Glucose, capillary     Status: Abnormal   Collection Time: 05/15/17  7:33 AM  Result Value Ref Range   Glucose-Capillary 285 (H) 65 - 99 mg/dL   Comment 1 Notify RN    Comment 2  Document in Chart   Glucose, capillary     Status: Abnormal   Collection Time: 05/15/17 11:29 AM  Result Value Ref Range   Glucose-Capillary 250 (H) 65 - 99 mg/dL   Comment 1 Notify RN    Comment 2 Document in Chart   Glucose, capillary     Status: Abnormal   Collection Time: 05/15/17  4:07 PM  Result Value Ref Range   Glucose-Capillary 293 (H) 65 - 99 mg/dL   Comment 1 Notify RN    Comment 2 Document in Chart   Glucose, capillary     Status: Abnormal   Collection Time: 05/15/17  9:01 PM  Result Value Ref Range   Glucose-Capillary 261 (H) 65 - 99 mg/dL   Comment 1 Notify RN    Comment 2 Document in Chart   Glucose, capillary     Status: Abnormal   Collection Time: 05/16/17  7:48 AM  Result Value Ref Range   Glucose-Capillary 244 (H) 65 - 99 mg/dL    ABGS No results for input(s): PHART, PO2ART, TCO2, HCO3 in the last 72 hours.  Invalid input(s): PCO2 CULTURES Recent Results (from the past 240 hour(s))  Blood culture (routine x 2)     Status: None (Preliminary result)   Collection Time: 05/13/17  5:10 PM  Result Value Ref Range Status   Specimen Description BLOOD RIGHT FOREARM  Final   Special Requests   Final    BOTTLES DRAWN AEROBIC AND ANAEROBIC Blood Culture adequate volume   Culture NO GROWTH 3 DAYS  Final   Report Status PENDING  Incomplete  Blood culture (routine x 2)     Status: None (Preliminary result)   Collection Time: 05/13/17  5:10 PM  Result Value Ref Range Status   Specimen Description BLOOD LEFT FOREARM  Final   Special Requests   Final    BOTTLES DRAWN AEROBIC AND ANAEROBIC Blood Culture results may not be optimal due to an inadequate volume of blood received in culture bottles   Culture NO GROWTH 3 DAYS  Final   Report Status PENDING  Incomplete   Studies/Results: No results found.  Medications:  Prior to Admission:  Prescriptions Prior to Admission  Medication Sig Dispense Refill Last Dose  . albuterol (PROVENTIL HFA;VENTOLIN HFA) 108 (90 BASE)  MCG/ACT inhaler Inhale 2 puffs into the lungs every 6 (six) hours as needed for wheezing or shortness of breath. Please instruct in proper technique. 1 Inhaler 1 unknown  . aspirin 81 MG tablet Take 81 mg by mouth daily.   05/12/2017 at Unknown time  . carvedilol (COREG) 6.25 MG tablet Take 1 tablet by mouth 2 (two) times daily.   05/13/2017 at Unknown time  . ferrous sulfate 325 (65 FE) MG tablet Take 325 mg by mouth daily with breakfast.    05/13/2017 at Unknown time  . glipiZIDE (GLUCOTROL) 10 MG tablet Take 10 mg by mouth 2 (two) times daily.   05/12/2017 at Unknown time  . metFORMIN (GLUCOPHAGE) 1000 MG tablet Take 1 tablet by mouth 2 (two) times daily.   05/13/2017 at Unknown time  . omeprazole (PRILOSEC) 20 MG capsule Take 20 mg by mouth daily as needed (indigestion).    unknown  . pravastatin (PRAVACHOL) 40 MG tablet Take 1 tablet by mouth every evening.    05/12/2017 at Unknown time  . quinapril (ACCUPRIL) 40 MG tablet Take 40 mg by mouth at bedtime.   05/12/2017 at Unknown time  . Saw Palmetto 1000 MG CAPS Take 1,000 mg by mouth 2 (two) times daily.    05/13/2017 at Unknown time  . sildenafil (REVATIO) 20 MG tablet Take 2-5 tablets by mouth daily as needed (intercourse).    unknown   Scheduled: . aspirin EC  81 mg Oral Daily  . carvedilol  6.25 mg Oral BID  . docusate sodium  100 mg Oral BID  . enoxaparin (LOVENOX) injection  40 mg Subcutaneous Q24H  . insulin aspart  0-15 Units Subcutaneous TID WC  . insulin aspart  4 Units Subcutaneous TID WC  . ipratropium-albuterol  3 mL Nebulization TID  . lisinopril  40 mg Oral QHS  . mouth rinse  15 mL Mouth Rinse BID  . methylPREDNISolone (SOLU-MEDROL) injection  80 mg Intravenous Q12H  . nicotine  14 mg Transdermal Daily  . pneumococcal 23 valent vaccine  0.5 mL Intramuscular Tomorrow-1000  . pravastatin  40 mg Oral QPM   Continuous: . doxycycline (VIBRAMYCIN) IV Stopped (05/16/17 0754)   CXK:[id number] **OR** acetaminophen, albuterol,  ondansetron **OR** ondansetron (ZOFRAN) IV  Assesment: He was admitted with acute on chronic hypoxic respiratory failure and COPD exacerbation. He is slowly improving. He is still wheezing however. He is still short of breath with exertion Principal Problem:   Acute on chronic respiratory failure with hypoxia (HCC) Active Problems:  TOBACCO ABUSE   COPD exacerbation ([place])   DM type 2 (diabetes mellitus, type 2) (HCC)   Elevated troponin    Plan: Agree he needs to stay at least 1 more day    LOS: 3 days   Aayla Marrocco L 05/16/2017, 8:41 AM

## 2017-05-17 LAB — GLUCOSE, CAPILLARY: GLUCOSE-CAPILLARY: 265 mg/dL — AB (ref 65–99)

## 2017-05-17 MED ORDER — FLUTICASONE-SALMETEROL 250-50 MCG/DOSE IN AEPB: 1.0000 | INHALATION_SPRAY | Freq: Two times a day (BID) | RESPIRATORY_TRACT | 3 refills | Status: DC

## 2017-05-17 MED ORDER — DOXYCYCLINE HYCLATE 50 MG PO CAPS: 100.0000 mg | ORAL_CAPSULE | Freq: Two times a day (BID) | ORAL | 0 refills | Status: DC

## 2017-05-17 MED ORDER — PREDNISONE 20 MG PO TABS: 20.0000 mg | ORAL_TABLET | Freq: Two times a day (BID) | ORAL | Status: DC

## 2017-05-17 MED ORDER — PREDNISONE 20 MG PO TABS: 20.0000 mg | ORAL_TABLET | Freq: Two times a day (BID) | ORAL | 0 refills | Status: DC

## 2017-05-17 NOTE — Discharge Summary (Signed)
Physician Discharge Summary  Clayton [name] GMW:[id number] DOB: August 21, 1951 DOA: 05/13/2017  PCP: Dione Housekeeper, MD  Admit date: 05/13/2017 Discharge date: 05/17/2017  Admitted From: Home.  Disposition:  To home.  Recommendations for Outpatient Follow-up:  1. Follow up with PCP in 1-2 weeks 2. Follow up with pulmonologist as scheduled.   Home Health: None.  Equipment/Devices: Already on home oxygen.  Discharge Condition: no wheezing.   No DOE.  CODE STATUS:  FULL CODE.  Diet recommendation: As tolerated.   Brief/Interim Summary:  Patient was admitted for SOB by Dr Lorin Mercy on May 13, 2017.  As per her H and P:  " Clayton [name] is a 66 y.o. male with medical history significant of remote prostate cancer; remote CAD; COPD on home O2; DM; HLD; HTN; and GERD presenting because "I got a little sick".  He reports that he "got weak".  He was also having a lot of trouble breathing.  He started feeling bad over the weekend.  He had no energy, "no get up and go".  Started feeling SOB about the same time.  +wheezing.  Does not know of a fever.  Wears O2 at home, has no idea how much.  Was SOB with walking around.  He saw his PCP today and was sent here.  +cough, productive of yellow-green sputum.  His sister reports that his O2 sats dropped in the low 80s with minimal ambulation.   ED Course: COPD exacerbation - given Solumedrol, Albuterol, Levaquin.  Normal lactate, does not appear to be septic.  Review of Systems: As per HPI; otherwise review of systems reviewed and negative.   Ambulatory Status:  Ambulates without assistance  HOSPITAL COURSE:  Patient was admitted and he was seen in consultation with Pulmonary.  Dr Luan Pulling recommended continue Tx with IV antibiotics, IV steroids, and nebs, and felt that he should be discharged on a chronic steroid inhaler as well.  He did not have PNA, and during his stay, he was very slow to respond.  He finally felt well, and had no significant wheezing today.   Both I and Dr Luan Pulling felt he is ready for discharge now.  He has home oxygen.   The biggest concern I have is that he is really not committed to quit cigarettes.  He was told if he smokes, he will likely ended up in the hospital again.  Nicotine patches were offered, and he declined.  He will be given Prednisone at 20mg  BID for a week, along with Advair 250, and 7 days of VIbramycin.  He will see his PCP in one week, and will see his pulmonologist in [place] as scheduled.  Thank you for allowing me to participate in his care.  Good Day.   Discharge Diagnoses:  Principal Problem:   Acute on chronic respiratory failure with hypoxia (HCC) Active Problems:   TOBACCO ABUSE   COPD exacerbation (HCC)   DM type 2 (diabetes mellitus, type 2) (HCC)   Elevated troponin    Discharge Instructions  Discharge Instructions    Diet - low sodium heart healthy    Complete by:  As directed    Discharge instructions    Complete by:  As directed    DO NOT SMOKE or you will likely be back to the hospital.  Take your medication as prescribed.  See your PCP next week and see your pulmonary doctor as scheduled.   Increase activity slowly    Complete by:  As directed      Allergies  as of 05/17/2017   No Known Allergies     Medication List    STOP taking these medications   quinapril 40 MG tablet Commonly known as:  ACCUPRIL   Saw Palmetto 1000 MG Caps     TAKE these medications   albuterol 108 (90 Base) MCG/ACT inhaler Commonly known as:  PROVENTIL HFA;VENTOLIN HFA Inhale 2 puffs into the lungs every 6 (six) hours as needed for wheezing or shortness of breath. Please instruct in proper technique.   amLODipine 5 MG tablet Commonly known as:  NORVASC Take 5 mg by mouth daily.   aspirin 81 MG tablet Take 81 mg by mouth daily.   atorvastatin 40 MG tablet Commonly known as:  LIPITOR Take 40 mg by mouth daily.   carvedilol 6.25 MG tablet Commonly known as:  COREG Take 1 tablet by mouth 2 (two)  times daily.   doxycycline 50 MG capsule Commonly known as:  VIBRAMYCIN Take 2 capsules (100 mg total) by mouth 2 (two) times daily.   ferrous sulfate 325 (65 FE) MG tablet Take 325 mg by mouth daily with breakfast.   Fluticasone-Salmeterol 250-50 MCG/DOSE Aepb Commonly known as:  ADVAIR DISKUS Inhale 1 puff into the lungs 2 (two) times daily.   furosemide 20 MG tablet Commonly known as:  LASIX Take 20 mg by mouth daily.   glipiZIDE 10 MG tablet Commonly known as:  GLUCOTROL Take 10 mg by mouth 2 (two) times daily.   lisinopril 5 MG tablet Commonly known as:  PRINIVIL,ZESTRIL Take 5 mg by mouth daily.   metFORMIN 1000 MG tablet Commonly known as:  GLUCOPHAGE Take 1 tablet by mouth 2 (two) times daily.   omeprazole 20 MG capsule Commonly known as:  PRILOSEC Take 20 mg by mouth daily as needed (indigestion).   pravastatin 40 MG tablet Commonly known as:  PRAVACHOL Take 1 tablet by mouth every evening.   predniSONE 20 MG tablet Commonly known as:  DELTASONE Take 1 tablet (20 mg total) by mouth 2 (two) times daily with a meal.   sildenafil 20 MG tablet Commonly known as:  REVATIO Take 2-5 tablets by mouth daily as needed (intercourse).            Discharge Care Instructions        Start     Ordered   05/17/17 0000  Increase activity slowly     05/17/17 0941   05/17/17 0000  Diet - low sodium heart healthy     05/17/17 0941   05/17/17 0000  Discharge instructions    Comments:  DO NOT SMOKE or you will likely be back to the hospital.  Take your medication as prescribed.  See your PCP next week and see your pulmonary doctor as scheduled.   05/17/17 0941   05/17/17 0000  Fluticasone-Salmeterol (ADVAIR DISKUS) 250-50 MCG/DOSE AEPB  2 times daily     05/17/17 0941   05/17/17 0000  doxycycline (VIBRAMYCIN) 50 MG capsule  2 times daily     05/17/17 0941   05/17/17 0000  predniSONE (DELTASONE) 20 MG tablet  2 times daily with meals     05/17/17 0955      No  Known Allergies  Consultations:  None.    Procedures/Studies: Dg Chest 2 View  Result Date: 05/13/2017 CLINICAL DATA:  Cough, hypoxia EXAM: CHEST  2 VIEW COMPARISON:  06/27/2015 FINDINGS: Heart is borderline in size. Mild interstitial prominence throughout the lungs could reflect interstitial edema or interstitial pneumonitis. No effusions. No acute  bony abnormality. IMPRESSION: Borderline heart size. Interstitial prominence throughout the lungs, question interstitial edema or interstitial pneumonitis. Electronically Signed   By: Rolm Baptise M.D.   On: 05/13/2017 17:38    Subjective:  Feeling well.   Discharge Exam: Vitals:   05/17/17 0000 05/17/17 0555  BP:  (!) 144/87  Pulse:  66  Resp:  18  Temp:  98 F (36.7 C)  SpO2: (!) 86% 100%   Vitals:   05/16/17 1401 05/16/17 2016 05/17/17 0000 05/17/17 0555  BP: (!) 160/70 (!) 134/59  (!) 144/87  Pulse: 72 79  66  Resp: 20 20  18   Temp: 97.8 F (36.6 C) 98.3 F (36.8 C)  98 F (36.7 C)  TempSrc: Oral Oral  Oral  SpO2: 98% 100% (!) 86% 100%  Weight:      Height:        General: Pt is alert, awake, not in acute distress Cardiovascular: RRR, S1/S2 +, no rubs, no gallops Respiratory: CTA bilaterally, no wheezing, no rhonchi Abdominal: Soft, NT, ND, bowel sounds + Extremities: no edema, no cyanosis    The results of significant diagnostics from this hospitalization (including imaging, microbiology, ancillary and laboratory) are listed below for reference.     Microbiology: Recent Results (from the past 240 hour(s))  Blood culture (routine x 2)     Status: None (Preliminary result)   Collection Time: 05/13/17  5:10 PM  Result Value Ref Range Status   Specimen Description BLOOD RIGHT FOREARM  Final   Special Requests   Final    BOTTLES DRAWN AEROBIC AND ANAEROBIC Blood Culture adequate volume   Culture NO GROWTH 4 DAYS  Final   Report Status PENDING  Incomplete  Blood culture (routine x 2)     Status: None  (Preliminary result)   Collection Time: 05/13/17  5:10 PM  Result Value Ref Range Status   Specimen Description BLOOD LEFT FOREARM  Final   Special Requests   Final    BOTTLES DRAWN AEROBIC AND ANAEROBIC Blood Culture results may not be optimal due to an inadequate volume of blood received in culture bottles   Culture NO GROWTH 4 DAYS  Final   Report Status PENDING  Incomplete     Labs: BNP (last 3 results)  Recent Labs  05/13/17 1747  BNP 258.5*   Basic Metabolic Panel:  Recent Labs Lab 05/13/17 1710 05/14/17 0320  NA 137 135  K 4.2 4.6  CL 100* 99*  CO2 32 30  GLUCOSE 171* 231*  BUN 9 9  CREATININE 0.63 0.62  CALCIUM 8.9 8.6*   Liver Function Tests:  Recent Labs Lab 05/13/17 1710  AST 25  ALT 25  ALKPHOS 109  BILITOT 1.6*  PROT 7.7  ALBUMIN 4.1    Recent Labs Lab 05/13/17 1710 05/14/17 0320  WBC 5.1 3.0*  NEUTROABS 3.9  --   HGB 13.5 13.6  HCT 42.0 42.7  MCV 94.2 94.5  PLT 172 160   Cardiac Enzymes:  Recent Labs Lab 05/13/17 1747 05/13/17 2125 05/14/17 0320 05/14/17 0900  TROPONINI 0.03* 0.03* <0.03 <0.03   BNP: Invalid input(s): POCBNP CBG:  Recent Labs Lab 05/16/17 0748 05/16/17 1139 05/16/17 1624 05/16/17 2005 05/17/17 0736  GLUCAP 244* 270* 271* 272* 265*   D-Dimer Urinalysis    Component Value Date/Time   COLORURINE YELLOW 12/07/2009 1021   APPEARANCEUR CLEAR 12/07/2009 1021   LABSPEC >=1.030 12/07/2009 1021   PHURINE 5.0 12/07/2009 1021   GLUCOSEU NEGATIVE 12/07/2009 1021  BILIRUBINUR NEGATIVE 12/07/2009 1021   KETONESUR NEGATIVE 12/07/2009 1021   UROBILINOGEN 1.0 12/07/2009 1021   NITRITE NEGATIVE 12/07/2009 1021   LEUKOCYTESUR TRACE 12/07/2009 1021   Microbiology Recent Results (from the past 240 hour(s))  Blood culture (routine x 2)     Status: None (Preliminary result)   Collection Time: 05/13/17  5:10 PM  Result Value Ref Range Status   Specimen Description BLOOD RIGHT FOREARM  Final   Special Requests    Final    BOTTLES DRAWN AEROBIC AND ANAEROBIC Blood Culture adequate volume   Culture NO GROWTH 4 DAYS  Final   Report Status PENDING  Incomplete  Blood culture (routine x 2)     Status: None (Preliminary result)   Collection Time: 05/13/17  5:10 PM  Result Value Ref Range Status   Specimen Description BLOOD LEFT FOREARM  Final   Special Requests   Final    BOTTLES DRAWN AEROBIC AND ANAEROBIC Blood Culture results may not be optimal due to an inadequate volume of blood received in culture bottles   Culture NO GROWTH 4 DAYS  Final   Report Status PENDING  Incomplete   Time coordinating discharge: Over 30 minutes SIGNED:  Orvan Falconer, MD FACP [facility] 05/17/2017, 9:56 AM   If 7PM-7AM, please contact night-coverage [email or web address] Password [email or web address]

## 2017-05-17 NOTE — Progress Notes (Signed)
Subjective: He says he feels much better and wants to go home.  Objective: Vital signs in last 24 hours: Temp:  [97.8 F (36.6 C)-98.3 F (36.8 C)] 98 F (36.7 C) (09/01 0555) Pulse Rate:  [66-79] 66 (09/01 0555) Resp:  [18-20] 18 (09/01 0555) BP: (134-160)/(59-87) 144/87 (09/01 0555) SpO2:  [86 %-100 %] 100 % (09/01 0555) Weight change:  Last BM Date: 05/16/17  Intake/Output from previous day: 08/31 0701 - 09/01 0700 In: 1220 [P.O.:720; IV Piggyback:500] Out: -   PHYSICAL EXAM General appearance: alert, cooperative and no distress Resp: clear to auscultation bilaterally Cardio: regular rate and rhythm, S1, S2 normal, no murmur, click, rub or gallop GI: soft, non-tender; bowel sounds normal; no masses,  no organomegaly Extremities: extremities normal, atraumatic, no cyanosis or edema Throat is clear  Lab Results:  Results for orders placed or performed during the hospital encounter of 05/13/17 (from the past 48 hour(s))  Glucose, capillary     Status: Abnormal   Collection Time: 05/15/17 11:29 AM  Result Value Ref Range   Glucose-Capillary 250 (H) 65 - 99 mg/dL   Comment 1 Notify RN    Comment 2 Document in Chart   Glucose, capillary     Status: Abnormal   Collection Time: 05/15/17  4:07 PM  Result Value Ref Range   Glucose-Capillary 293 (H) 65 - 99 mg/dL   Comment 1 Notify RN    Comment 2 Document in Chart   Glucose, capillary     Status: Abnormal   Collection Time: 05/15/17  9:01 PM  Result Value Ref Range   Glucose-Capillary 261 (H) 65 - 99 mg/dL   Comment 1 Notify RN    Comment 2 Document in Chart   Glucose, capillary     Status: Abnormal   Collection Time: 05/16/17  7:48 AM  Result Value Ref Range   Glucose-Capillary 244 (H) 65 - 99 mg/dL  Glucose, capillary     Status: Abnormal   Collection Time: 05/16/17 11:39 AM  Result Value Ref Range   Glucose-Capillary 270 (H) 65 - 99 mg/dL  Glucose, capillary     Status: Abnormal   Collection Time: 05/16/17  4:24  PM  Result Value Ref Range   Glucose-Capillary 271 (H) 65 - 99 mg/dL   Comment 1 Notify RN    Comment 2 Document in Chart   Glucose, capillary     Status: Abnormal   Collection Time: 05/16/17  8:05 PM  Result Value Ref Range   Glucose-Capillary 272 (H) 65 - 99 mg/dL   Comment 1 Notify RN    Comment 2 Document in Chart   Glucose, capillary     Status: Abnormal   Collection Time: 05/17/17  7:36 AM  Result Value Ref Range   Glucose-Capillary 265 (H) 65 - 99 mg/dL    ABGS No results for input(s): PHART, PO2ART, TCO2, HCO3 in the last 72 hours.  Invalid input(s): PCO2 CULTURES Recent Results (from the past 240 hour(s))  Blood culture (routine x 2)     Status: None (Preliminary result)   Collection Time: 05/13/17  5:10 PM  Result Value Ref Range Status   Specimen Description BLOOD RIGHT FOREARM  Final   Special Requests   Final    BOTTLES DRAWN AEROBIC AND ANAEROBIC Blood Culture adequate volume   Culture NO GROWTH 4 DAYS  Final   Report Status PENDING  Incomplete  Blood culture (routine x 2)     Status: None (Preliminary result)   Collection Time:  05/13/17  5:10 PM  Result Value Ref Range Status   Specimen Description BLOOD LEFT FOREARM  Final   Special Requests   Final    BOTTLES DRAWN AEROBIC AND ANAEROBIC Blood Culture results may not be optimal due to an inadequate volume of blood received in culture bottles   Culture NO GROWTH 4 DAYS  Final   Report Status PENDING  Incomplete   Studies/Results: No results found.  Medications:  Prior to Admission:  Prescriptions Prior to Admission  Medication Sig Dispense Refill Last Dose  . albuterol (PROVENTIL HFA;VENTOLIN HFA) 108 (90 BASE) MCG/ACT inhaler Inhale 2 puffs into the lungs every 6 (six) hours as needed for wheezing or shortness of breath. Please instruct in proper technique. 1 Inhaler 1 unknown  . amLODipine (NORVASC) 5 MG tablet Take 5 mg by mouth daily.   Past Week at Unknown time  . aspirin 81 MG tablet Take 81 mg by  mouth daily.   05/12/2017 at Unknown time  . atorvastatin (LIPITOR) 40 MG tablet Take 40 mg by mouth daily.   Past Week at Unknown time  . carvedilol (COREG) 6.25 MG tablet Take 1 tablet by mouth 2 (two) times daily.   05/13/2017 at 0200  . ferrous sulfate 325 (65 FE) MG tablet Take 325 mg by mouth daily with breakfast.    05/13/2017 at Unknown time  . furosemide (LASIX) 20 MG tablet Take 20 mg by mouth daily.   Past Week at Unknown time  . glipiZIDE (GLUCOTROL) 10 MG tablet Take 10 mg by mouth 2 (two) times daily.   05/12/2017 at Unknown time  . lisinopril (PRINIVIL,ZESTRIL) 5 MG tablet Take 5 mg by mouth daily.   Past Week at Unknown time  . metFORMIN (GLUCOPHAGE) 1000 MG tablet Take 1 tablet by mouth 2 (two) times daily.   05/13/2017 at Unknown time  . omeprazole (PRILOSEC) 20 MG capsule Take 20 mg by mouth daily as needed (indigestion).    unknown  . pravastatin (PRAVACHOL) 40 MG tablet Take 1 tablet by mouth every evening.    05/12/2017 at Unknown time  . quinapril (ACCUPRIL) 40 MG tablet Take 40 mg by mouth at bedtime.   05/12/2017 at Unknown time  . Saw Palmetto 1000 MG CAPS Take 1,000 mg by mouth 2 (two) times daily.    05/13/2017 at Unknown time  . sildenafil (REVATIO) 20 MG tablet Take 2-5 tablets by mouth daily as needed (intercourse).    unknown   Scheduled: . aspirin EC  81 mg Oral Daily  . carvedilol  6.25 mg Oral BID  . docusate sodium  100 mg Oral BID  . enoxaparin (LOVENOX) injection  40 mg Subcutaneous Q24H  . insulin aspart  0-15 Units Subcutaneous TID WC  . insulin aspart  4 Units Subcutaneous TID WC  . ipratropium-albuterol  3 mL Nebulization TID  . lisinopril  40 mg Oral QHS  . mouth rinse  15 mL Mouth Rinse BID  . methylPREDNISolone (SOLU-MEDROL) injection  80 mg Intravenous Q12H  . nicotine  14 mg Transdermal Daily  . pneumococcal 23 valent vaccine  0.5 mL Intramuscular Tomorrow-1000  . pravastatin  40 mg Oral QPM  . predniSONE  20 mg Oral BID WC   Continuous: .  doxycycline (VIBRAMYCIN) IV Stopped (05/17/17 0109)   QQP:[id number] **OR** acetaminophen, albuterol, ondansetron **OR** ondansetron (ZOFRAN) IV  Assesment: He was admitted with acute on chronic hypoxic respiratory failure with COPD exacerbation. He is markedly improved now. He says he feels like he  is about back at baseline and he wants to go home. Principal Problem:   Acute on chronic respiratory failure with hypoxia (HCC) Active Problems:   TOBACCO ABUSE   COPD exacerbation (HCC)   DM type 2 (diabetes mellitus, type 2) (HCC)   Elevated troponin    Plan: Okay for discharge from pulmonary point of view. If he wants to follow-up in my office and I will be glad to see him    LOS: 4 days   Clayton [name] L 05/17/2017, 10:00 AM

## 2017-05-17 NOTE — Progress Notes (Signed)
Pt ambulated from room to family waiting room to fix a cup of coffee.  Pt sats checked on room air with saturation 82-86%  returned to 100% with [place] reapplied.

## 2017-05-17 NOTE — Progress Notes (Signed)
Patient is to be discharged home and in stable condition. IV removed, WNL. Patient given discharge instructions and verbalized understanding. All questions and concerns answered. Patient awaiting transportation at this time.  Celestia Khat, RN

## 2017-05-18 LAB — CULTURE, BLOOD (ROUTINE X 2)
CULTURE: NO GROWTH
Culture: NO GROWTH
Special Requests: ADEQUATE

## 2017-07-08 ENCOUNTER — Ambulatory Visit: Payer: 59 | Admitting: Cardiology

## 2017-07-08 ENCOUNTER — Encounter: Payer: Self-pay | Admitting: Cardiology

## 2017-07-08 NOTE — Progress Notes (Deleted)
Clinical Summary Mr. Appelt is a 66 y.o.male seen today for follow up of the following medical problems.    1. CAD - hx of PCI in 2009. 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 LVEF at that time by LVgram 55%.  - 05/2015 echo LVEF 55%, mild AI   - no recent chest pain. Occasional SOB at times. 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   2. HTN - mixed compliance with meds  3. COPD - noted on PFTs 11/2015, he was referred to Dr Luan Pulling at that time  - mixed compliance with inhalers    Past Medical History:  Diagnosis Date  . COPD (chronic obstructive pulmonary disease) ([place])   . Diabetes mellitus   . GERD (gastroesophageal reflux disease)   . HTN (hypertension)   . Hyperlipidemia   . Myocardial infarction ([place]) 2009  . Prostate cancer ([place])   . PVD (peripheral vascular disease) ([place])      No Known Allergies   Current Outpatient Prescriptions  Medication Sig Dispense Refill  . albuterol (PROVENTIL HFA;VENTOLIN HFA) 108 (90 BASE) MCG/ACT inhaler Inhale 2 puffs into the lungs every 6 (six) hours as needed for wheezing or shortness of breath. Please instruct in proper technique. 1 Inhaler 1  . amLODipine (NORVASC) 5 MG tablet Take 5 mg by mouth daily.    Marland Kitchen aspirin 81 MG tablet Take 81 mg by mouth daily.    Marland Kitchen atorvastatin (LIPITOR) 40 MG tablet Take 40 mg by mouth daily.    . carvedilol (COREG) 6.25 MG tablet Take 1 tablet by mouth 2 (two) times daily.    Marland Kitchen doxycycline (VIBRAMYCIN) 50 MG capsule Take 2 capsules (100 mg total) by mouth 2 (two) times daily. 14 capsule 0  . ferrous sulfate 325 (65 FE) MG tablet Take 325 mg by mouth daily with breakfast.     . Fluticasone-Salmeterol (ADVAIR DISKUS) 250-50 MCG/DOSE AEPB Inhale 1 puff into the lungs 2 (two) times daily. 1 each 3  . furosemide (LASIX) 20 MG  tablet Take 20 mg by mouth daily.    Marland Kitchen glipiZIDE (GLUCOTROL) 10 MG tablet Take 10 mg by mouth 2 (two) times daily.    Marland Kitchen lisinopril (PRINIVIL,ZESTRIL) 5 MG tablet Take 5 mg by mouth daily.    . metFORMIN (GLUCOPHAGE) 1000 MG tablet Take 1 tablet by mouth 2 (two) times daily.    Marland Kitchen omeprazole (PRILOSEC) 20 MG capsule Take 20 mg by mouth daily as needed (indigestion).     . pravastatin (PRAVACHOL) 40 MG tablet Take 1 tablet by mouth every evening.     . predniSONE (DELTASONE) 20 MG tablet Take 1 tablet (20 mg total) by mouth 2 (two) times daily with a meal. 17 tablet 0  . sildenafil (REVATIO) 20 MG tablet Take 2-5 tablets by mouth daily as needed (intercourse).      No current facility-administered medications for this visit.      Past Surgical History:  Procedure Laterality Date  . CARDIAC CATHETERIZATION     with stent  . cardiac stents  2009  . CATARACT EXTRACTION W/PHACO Left 08/19/2013   Procedure: CATARACT EXTRACTION PHACO AND INTRAOCULAR LENS PLACEMENT (IOC);  Surgeon: Tonny Charon Smedberg, MD;  Location: [facility];  Service: Ophthalmology;  Laterality: Left;  CDE:37.77  . CATARACT EXTRACTION W/PHACO Right 07/29/2016   Procedure: CATARACT EXTRACTION PHACO AND INTRAOCULAR  LENS PLACEMENT RIGHT EYE; CDE:  18.10;  Surgeon: Tonny Jejuan Scala, MD;  Location: [facility];  Service: Ophthalmology;  Laterality: Right;     No Known Allergies    Family History  Problem Relation Age of Onset  . Coronary artery disease Mother   . Coronary artery disease Father   . Prostate cancer Father      Social History Mr. Spackman reports that he has been smoking Cigarettes.  He started smoking about 48 years ago. He has a 42.00 pack-year smoking history. He has never used smokeless tobacco. Mr. Bogan reports that he does not drink alcohol.   Review of Systems CONSTITUTIONAL: No weight loss, fever, chills, weakness or fatigue.  HEENT: Eyes: No visual loss, blurred vision, double vision or yellow sclerae.No hearing loss,  sneezing, congestion, runny nose or sore throat.  SKIN: No rash or itching.  CARDIOVASCULAR:  RESPIRATORY: No shortness of breath, cough or sputum.  GASTROINTESTINAL: No anorexia, nausea, vomiting or diarrhea. No abdominal pain or blood.  GENITOURINARY: No burning on urination, no polyuria NEUROLOGICAL: No headache, dizziness, syncope, paralysis, ataxia, numbness or tingling in the extremities. No change in bowel or bladder control.  MUSCULOSKELETAL: No muscle, back pain, joint pain or stiffness.  LYMPHATICS: No enlarged nodes. No history of splenectomy.  PSYCHIATRIC: No history of depression or anxiety.  ENDOCRINOLOGIC: No reports of sweating, cold or heat intolerance. No polyuria or polydipsia.  Marland Kitchen   Physical Examination There were no vitals filed for this visit. There were no vitals filed for this visit.  Gen: resting comfortably, no acute distress HEENT: no scleral icterus, pupils equal round and reactive, no palptable cervical adenopathy,  CV Resp: Clear to auscultation bilaterally GI: abdomen is soft, non-tender, non-distended, normal bowel sounds, no hepatosplenomegaly MSK: extremities are warm, no edema.  Skin: warm, no rash Neuro:  no focal deficits Psych: appropriate affect   Diagnostic Studies 11/2007 Cath HEMODYNAMIC RESULTS: Aorta 95/83 mmHg. Left ventricle 94/22 mmHg.  ANGIOGRAPHIC FINDINGS: 1. Left main coronary artery is free of significant flow-limiting  coronary atherosclerosis and gives rise to the left anterior  descending and circumflex vessels. 2. Left anterior descending is a medium-caliber vessel that extends  down to the apex. There are 3 diagonal branches. Within the  proximal portion of the vessel distal to the first diagonal Calena Salem  is an area of 40% to 50% stenosis followed by an area of 30%  stenosis in the midvessel. In the distal vessel there is an area  of 30% diffuse stenosis. Flow was TIMI-3 in this  vessel. 3. The circumflex vessel is medium in caliber. There are 4 obtuse  marginal branches. The first Alba Kriesel is the largest. Within this  Keo Schirmer is an area of relatively focal 60% to 70% stenosis  surrounded by an area of approximately 50% to 60% stenosis in  diffuse fashion. Distal to this is an area of 50% more focal  stenosis. 4. Otherwise, there are relatively mild luminal irregularities to  approximately 20% in the circumflex vessel and an area of 30%  stenosis within the fourth obtuse marginal Deniqua Middlekauff. 5. Right coronary artery is occluded in the proximal to midvessel  segment. There are faint left-to-right collaterals that fill a  portion of the distal vessel, although the remainder of the vessel  is not well seen.  LEFT VENTRICULOGRAPHY: Performed in the RAO projection and reveals an ejection fraction of approximately 55% with mid to basal inferior akinesis and trace mitral regurgitation.  DIAGNOSES: 1. Coronary atherosclerosis as outlined including  an occluded proximal  to mid right coronary artery associated with faint left-to-right  collateral filling of the distal vessel. There is otherwise  moderate left system disease including 60% to 70% stenosis  involving the obtuse marginal and 40% to 50% stenosis in the  proximal left anterior descending. 2. Left ventricular ejection fraction of approximately 55% with mid to  basal inferior akinesis, trace mitral regurgitation and left  ventricular end-diastolic pressure of 22 mmHg.  DISCUSSION: I reviewed the results with the patient and also discussed the films with Dr. Lia Foyer. At this point, I anticipate percutaneous intervention to treat the right coronary artery and otherwise medical therapy.   05/2015 echo  Study Conclusions  - Left ventricle: Technically dificiult study. 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 The  cavity size was mildly dilated. Wall thickness was increased in a pattern of mild LVH. The estimated ejection fraction was 55%. - Aortic valve: Sclerosis without stenosis. There was mild regurgitation. - Right ventricle: The cavity size was normal. Systolic function was normal.  11/2015 PFTs Severe COPD    Assessment and Plan  1. CAD - no recent chest pain, we will continue current meds  2. HTN - at goal, continue current meds  3. COPD - encouraged increased compliance with inhalers    F/u 33months      Arnoldo Lenis, M.D., F.A.C.C.

## 2017-09-01 ENCOUNTER — Telehealth: Payer: Self-pay | Admitting: Cardiology

## 2017-09-01 NOTE — Telephone Encounter (Signed)
Numerous attempts to contact patient with recall letters. Unable to reach by telephone. with no success.    Status:    Clayton [name] [[id number]] 02/07/2016 11:53 AM New [10]   [System] 05/17/2016 11:04 PM Notification Sent [20]   Clayton [name] [[id number]] 11/21/2016 1:37 PM Notification Sent [20]   Clayton [name] [[id number]] 02/27/2017 11:09 AM Notification Sent [20]   Clayton [name] [[id number]] 03/04/2017 3:23 PM Scheduled/Linked [30]   Clayton [name] [[id number]] 05/08/2017 9:08 AM Notification Sent [20]   Clayton [name] [[id number]] 05/08/2017 9:08 AM Scheduled/Linked [30]   [System] 07/12/2017 1:06 AM Notification Sent [20]

## 2018-02-03 ENCOUNTER — Encounter: Payer: Self-pay | Admitting: Gastroenterology

## 2018-06-25 ENCOUNTER — Ambulatory Visit (INDEPENDENT_AMBULATORY_CARE_PROVIDER_SITE_OTHER): Payer: 59 | Admitting: Internal Medicine

## 2018-07-03 ENCOUNTER — Ambulatory Visit (INDEPENDENT_AMBULATORY_CARE_PROVIDER_SITE_OTHER): Payer: Medicare Other | Admitting: Internal Medicine

## 2018-07-03 ENCOUNTER — Encounter (INDEPENDENT_AMBULATORY_CARE_PROVIDER_SITE_OTHER): Payer: Self-pay | Admitting: *Deleted

## 2018-07-03 ENCOUNTER — Telehealth (INDEPENDENT_AMBULATORY_CARE_PROVIDER_SITE_OTHER): Payer: Self-pay | Admitting: *Deleted

## 2018-07-03 ENCOUNTER — Encounter (INDEPENDENT_AMBULATORY_CARE_PROVIDER_SITE_OTHER): Payer: Self-pay | Admitting: Internal Medicine

## 2018-07-03 VITALS — BP 120/72 | HR 76 | Temp 98.0°F | Ht 72.0 in | Wt 209.2 lb

## 2018-07-03 DIAGNOSIS — R195 Other fecal abnormalities: Secondary | ICD-10-CM

## 2018-07-03 HISTORY — DX: Other fecal abnormalities: R19.5

## 2018-07-03 MED ORDER — PEG 3350-KCL-NA BICARB-NACL 420 G PO SOLR: 4000.0000 mL | Freq: Once | ORAL | 0 refills | Status: AC

## 2018-07-03 NOTE — Progress Notes (Signed)
Subjective:    Patient ID: Clayton [name], male    DOB: 1951/07/21, 67 y.o.   MRN: [id number]  HPI Referred by Dr. Edrick Oh for positive stool card. He says he has not seen any blood.  Stools are normal. Stools are nice and brown. No weight loss. His appetite is good. No abdominal pain. Thinks his brother and father died of colon cancer.   2018/06/02 TIBC 346, UIBC 267, Iron 79 (normal), Iron sat 23. H and H 13.8 and 45.1  His last colonoscopy was in 2009. Elicia Lamp).  Evaluation of: Anemia with low iron saturation. Positive fecal occult blood test per home screening. which revealed Assessment Abnormal examination, see findings above.  Diagnoses: 211.3: Colon Polyps.  562.10: Diverticulosis.  Biopsy benign polypoid colonic mucosa. No adenomatous change or evidence of malignancy.   Hx of MI years ago.smokes, hx of prostate cancer, COPD Review of Systems Past Medical History:  Diagnosis Date  . COPD (chronic obstructive pulmonary disease) ([place])   . Diabetes mellitus   . GERD (gastroesophageal reflux disease)   . HTN (hypertension)   . Hyperlipidemia   . Myocardial infarction ([place]) 2009  . Prostate cancer ([place])   . PVD (peripheral vascular disease) ([place])     Past Surgical History:  Procedure Laterality Date  . CARDIAC CATHETERIZATION     with stent  . cardiac stents  2009  . CATARACT EXTRACTION W/PHACO Left 08/19/2013   Procedure: CATARACT EXTRACTION PHACO AND INTRAOCULAR LENS PLACEMENT (IOC);  Surgeon: Tonny Branch, MD;  Location: [facility];  Service: Ophthalmology;  Laterality: Left;  CDE:37.77  . CATARACT EXTRACTION W/PHACO Right 07/29/2016   Procedure: CATARACT EXTRACTION PHACO AND INTRAOCULAR LENS PLACEMENT RIGHT EYE; CDE:  18.10;  Surgeon: Tonny Branch, MD;  Location: [facility];  Service: Ophthalmology;  Laterality: Right;    No Known Allergies  Current Outpatient Medications on File Prior to Visit  Medication Sig Dispense Refill  . amLODipine (NORVASC) 5 MG tablet  Take 5 mg by mouth daily.    Marland Kitchen aspirin 81 MG tablet Take 81 mg by mouth. When he remembers    . carvedilol (COREG) 6.25 MG tablet Take 1 tablet by mouth 2 (two) times daily.    . furosemide (LASIX) 20 MG tablet Take 20 mg by mouth daily.    Marland Kitchen glipiZIDE (GLUCOTROL) 10 MG tablet Take 10 mg by mouth 2 (two) times daily.    Marland Kitchen lisinopril (PRINIVIL,ZESTRIL) 5 MG tablet Take 5 mg by mouth daily.    . metFORMIN (GLUCOPHAGE) 1000 MG tablet Take 1 tablet by mouth 2 (two) times daily.    . sildenafil (REVATIO) 20 MG tablet Take 2-5 tablets by mouth daily as needed (intercourse).     Marland Kitchen albuterol (PROVENTIL HFA;VENTOLIN HFA) 108 (90 BASE) MCG/ACT inhaler Inhale 2 puffs into the lungs every 6 (six) hours as needed for wheezing or shortness of breath. Please instruct in proper technique. 1 Inhaler 1  . Fluticasone-Salmeterol (ADVAIR DISKUS) 250-50 MCG/DOSE AEPB Inhale 1 puff into the lungs 2 (two) times daily. 1 each 3   No current facility-administered medications on file prior to visit.         Objective:   Physical Exam Blood pressure 120/72, pulse 76, temperature 98 F (36.7 C), height 6' (1.829 m), weight 209 lb 3.2 oz (94.9 kg). Alert and oriented. Skin warm and dry. Oral mucosa is moist.   . Sclera anicteric, conjunctivae is pink. Thyroid not enlarged. No cervical lymphadenopathy. Lungs clear. Heart regular rate and rhythm.  Abdomen is soft. Bowel sounds are positive. No hepatomegaly. No abdominal masses felt. No tenderness.  2+ edema to lower extremities.           Assessment & Plan:  Guaiac positive stool. Colonic neoplasm needs to be ruled out. The risks of bleeding, perforation and infection were reviewed with patient.

## 2018-07-03 NOTE — Telephone Encounter (Signed)
Patient needs trilyte 

## 2018-08-24 ENCOUNTER — Emergency Department (HOSPITAL_COMMUNITY): Payer: Medicare Other

## 2018-08-24 ENCOUNTER — Other Ambulatory Visit: Payer: Self-pay

## 2018-08-24 ENCOUNTER — Inpatient Hospital Stay (HOSPITAL_COMMUNITY)
Admission: EM | Admit: 2018-08-24 | Discharge: 2018-09-05 | DRG: 003 | Disposition: A | Discharge status: Other Acute Inpatient Hospital | Payer: Medicare Other | Attending: Neurology | Admitting: Neurology

## 2018-08-24 ENCOUNTER — Encounter (HOSPITAL_COMMUNITY): Payer: Self-pay | Admitting: Emergency Medicine

## 2018-08-24 DIAGNOSIS — Z9861 Coronary angioplasty status: Secondary | ICD-10-CM | POA: Diagnosis not present

## 2018-08-24 DIAGNOSIS — R945 Abnormal results of liver function studies: Secondary | ICD-10-CM

## 2018-08-24 DIAGNOSIS — R7989 Other specified abnormal findings of blood chemistry: Secondary | ICD-10-CM | POA: Diagnosis present

## 2018-08-24 DIAGNOSIS — Z955 Presence of coronary angioplasty implant and graft: Secondary | ICD-10-CM

## 2018-08-24 DIAGNOSIS — F17200 Nicotine dependence, unspecified, uncomplicated: Secondary | ICD-10-CM | POA: Diagnosis present

## 2018-08-24 DIAGNOSIS — K219 Gastro-esophageal reflux disease without esophagitis: Secondary | ICD-10-CM | POA: Diagnosis present

## 2018-08-24 DIAGNOSIS — H518 Other specified disorders of binocular movement: Secondary | ICD-10-CM | POA: Diagnosis present

## 2018-08-24 DIAGNOSIS — R414 Neurologic neglect syndrome: Secondary | ICD-10-CM | POA: Diagnosis present

## 2018-08-24 DIAGNOSIS — Z7982 Long term (current) use of aspirin: Secondary | ICD-10-CM

## 2018-08-24 DIAGNOSIS — R4702 Dysphasia: Secondary | ICD-10-CM | POA: Diagnosis present

## 2018-08-24 DIAGNOSIS — E860 Dehydration: Secondary | ICD-10-CM | POA: Diagnosis present

## 2018-08-24 DIAGNOSIS — I63311 Cerebral infarction due to thrombosis of right middle cerebral artery: Secondary | ICD-10-CM | POA: Diagnosis not present

## 2018-08-24 DIAGNOSIS — Z9119 Patient's noncompliance with other medical treatment and regimen: Secondary | ICD-10-CM

## 2018-08-24 DIAGNOSIS — H510 Palsy (spasm) of conjugate gaze: Secondary | ICD-10-CM | POA: Diagnosis present

## 2018-08-24 DIAGNOSIS — J9811 Atelectasis: Secondary | ICD-10-CM | POA: Diagnosis present

## 2018-08-24 DIAGNOSIS — Z8546 Personal history of malignant neoplasm of prostate: Secondary | ICD-10-CM

## 2018-08-24 DIAGNOSIS — E875 Hyperkalemia: Secondary | ICD-10-CM | POA: Diagnosis present

## 2018-08-24 DIAGNOSIS — I214 Non-ST elevation (NSTEMI) myocardial infarction: Secondary | ICD-10-CM | POA: Diagnosis not present

## 2018-08-24 DIAGNOSIS — I252 Old myocardial infarction: Secondary | ICD-10-CM

## 2018-08-24 DIAGNOSIS — J9601 Acute respiratory failure with hypoxia: Secondary | ICD-10-CM | POA: Diagnosis not present

## 2018-08-24 DIAGNOSIS — R4701 Aphasia: Secondary | ICD-10-CM | POA: Diagnosis present

## 2018-08-24 DIAGNOSIS — E785 Hyperlipidemia, unspecified: Secondary | ICD-10-CM | POA: Diagnosis present

## 2018-08-24 DIAGNOSIS — I6521 Occlusion and stenosis of right carotid artery: Secondary | ICD-10-CM | POA: Diagnosis present

## 2018-08-24 DIAGNOSIS — E11649 Type 2 diabetes mellitus with hypoglycemia without coma: Secondary | ICD-10-CM | POA: Diagnosis present

## 2018-08-24 DIAGNOSIS — F17210 Nicotine dependence, cigarettes, uncomplicated: Secondary | ICD-10-CM | POA: Diagnosis present

## 2018-08-24 DIAGNOSIS — R4182 Altered mental status, unspecified: Secondary | ICD-10-CM | POA: Diagnosis not present

## 2018-08-24 DIAGNOSIS — I639 Cerebral infarction, unspecified: Secondary | ICD-10-CM

## 2018-08-24 DIAGNOSIS — I10 Essential (primary) hypertension: Secondary | ICD-10-CM | POA: Diagnosis present

## 2018-08-24 DIAGNOSIS — Z9981 Dependence on supplemental oxygen: Secondary | ICD-10-CM

## 2018-08-24 DIAGNOSIS — Z8042 Family history of malignant neoplasm of prostate: Secondary | ICD-10-CM

## 2018-08-24 DIAGNOSIS — I6601 Occlusion and stenosis of right middle cerebral artery: Secondary | ICD-10-CM | POA: Diagnosis not present

## 2018-08-24 DIAGNOSIS — E872 Acidosis: Secondary | ICD-10-CM | POA: Diagnosis present

## 2018-08-24 DIAGNOSIS — I2721 Secondary pulmonary arterial hypertension: Secondary | ICD-10-CM | POA: Diagnosis not present

## 2018-08-24 DIAGNOSIS — J9621 Acute and chronic respiratory failure with hypoxia: Secondary | ICD-10-CM | POA: Diagnosis present

## 2018-08-24 DIAGNOSIS — Z0189 Encounter for other specified special examinations: Secondary | ICD-10-CM

## 2018-08-24 DIAGNOSIS — N179 Acute kidney failure, unspecified: Secondary | ICD-10-CM

## 2018-08-24 DIAGNOSIS — E1151 Type 2 diabetes mellitus with diabetic peripheral angiopathy without gangrene: Secondary | ICD-10-CM | POA: Diagnosis present

## 2018-08-24 DIAGNOSIS — Z789 Other specified health status: Secondary | ICD-10-CM

## 2018-08-24 DIAGNOSIS — I2510 Atherosclerotic heart disease of native coronary artery without angina pectoris: Secondary | ICD-10-CM

## 2018-08-24 DIAGNOSIS — I248 Other forms of acute ischemic heart disease: Secondary | ICD-10-CM | POA: Diagnosis present

## 2018-08-24 DIAGNOSIS — R55 Syncope and collapse: Secondary | ICD-10-CM

## 2018-08-24 DIAGNOSIS — T380X5A Adverse effect of glucocorticoids and synthetic analogues, initial encounter: Secondary | ICD-10-CM | POA: Diagnosis present

## 2018-08-24 DIAGNOSIS — G8194 Hemiplegia, unspecified affecting left nondominant side: Secondary | ICD-10-CM | POA: Diagnosis present

## 2018-08-24 DIAGNOSIS — E1165 Type 2 diabetes mellitus with hyperglycemia: Secondary | ICD-10-CM | POA: Diagnosis present

## 2018-08-24 DIAGNOSIS — I63411 Cerebral infarction due to embolism of right middle cerebral artery: Secondary | ICD-10-CM | POA: Diagnosis present

## 2018-08-24 DIAGNOSIS — K529 Noninfective gastroenteritis and colitis, unspecified: Secondary | ICD-10-CM | POA: Diagnosis not present

## 2018-08-24 DIAGNOSIS — Z930 Tracheostomy status: Secondary | ICD-10-CM | POA: Diagnosis not present

## 2018-08-24 DIAGNOSIS — J441 Chronic obstructive pulmonary disease with (acute) exacerbation: Secondary | ICD-10-CM | POA: Diagnosis present

## 2018-08-24 DIAGNOSIS — Z8249 Family history of ischemic heart disease and other diseases of the circulatory system: Secondary | ICD-10-CM

## 2018-08-24 DIAGNOSIS — I071 Rheumatic tricuspid insufficiency: Secondary | ICD-10-CM | POA: Diagnosis present

## 2018-08-24 DIAGNOSIS — I361 Nonrheumatic tricuspid (valve) insufficiency: Secondary | ICD-10-CM | POA: Diagnosis not present

## 2018-08-24 DIAGNOSIS — E1159 Type 2 diabetes mellitus with other circulatory complications: Secondary | ICD-10-CM | POA: Diagnosis not present

## 2018-08-24 DIAGNOSIS — I4510 Unspecified right bundle-branch block: Secondary | ICD-10-CM | POA: Diagnosis present

## 2018-08-24 DIAGNOSIS — Z978 Presence of other specified devices: Secondary | ICD-10-CM | POA: Diagnosis not present

## 2018-08-24 DIAGNOSIS — Z9289 Personal history of other medical treatment: Secondary | ICD-10-CM

## 2018-08-24 DIAGNOSIS — J9690 Respiratory failure, unspecified, unspecified whether with hypoxia or hypercapnia: Secondary | ICD-10-CM

## 2018-08-24 DIAGNOSIS — R0603 Acute respiratory distress: Secondary | ICD-10-CM | POA: Diagnosis not present

## 2018-08-24 DIAGNOSIS — E119 Type 2 diabetes mellitus without complications: Secondary | ICD-10-CM

## 2018-08-24 DIAGNOSIS — J449 Chronic obstructive pulmonary disease, unspecified: Secondary | ICD-10-CM | POA: Diagnosis not present

## 2018-08-24 DIAGNOSIS — R778 Other specified abnormalities of plasma proteins: Secondary | ICD-10-CM | POA: Diagnosis present

## 2018-08-24 HISTORY — DX: Atherosclerotic heart disease of native coronary artery without angina pectoris: I25.10

## 2018-08-24 LAB — I-STAT CHEM 8, ED
BUN: 55 mg/dL — ABNORMAL HIGH (ref 8–23)
CREATININE: 4.2 mg/dL — AB (ref 0.61–1.24)
Calcium, Ion: 1.06 mmol/L — ABNORMAL LOW (ref 1.15–1.40)
Chloride: 100 mmol/L (ref 98–111)
GLUCOSE: 78 mg/dL (ref 70–99)
HEMATOCRIT: 43 % (ref 39.0–52.0)
HEMOGLOBIN: 14.6 g/dL (ref 13.0–17.0)
Potassium: 5.9 mmol/L — ABNORMAL HIGH (ref 3.5–5.1)
Sodium: 136 mmol/L (ref 135–145)
TCO2: 32 mmol/L (ref 22–32)

## 2018-08-24 LAB — I-STAT CG4 LACTIC ACID, ED
Lactic Acid, Venous: 2.42 mmol/L (ref 0.5–1.9)
Lactic Acid, Venous: 2.58 mmol/L (ref 0.5–1.9)

## 2018-08-24 LAB — BRAIN NATRIURETIC PEPTIDE: B Natriuretic Peptide: 678 pg/mL — ABNORMAL HIGH (ref 0.0–100.0)

## 2018-08-24 LAB — CBC WITH DIFFERENTIAL/PLATELET
Abs Immature Granulocytes: 0.03 10*3/uL (ref 0.00–0.07)
Basophils Absolute: 0 10*3/uL (ref 0.0–0.1)
Basophils Relative: 0 %
Eosinophils Absolute: 0 10*3/uL (ref 0.0–0.5)
Eosinophils Relative: 0 %
HCT: 41.1 % (ref 39.0–52.0)
Hemoglobin: 12.5 g/dL — ABNORMAL LOW (ref 13.0–17.0)
Immature Granulocytes: 1 %
Lymphocytes Relative: 13 %
Lymphs Abs: 0.7 10*3/uL (ref 0.7–4.0)
MCH: 28.7 pg (ref 26.0–34.0)
MCHC: 30.4 g/dL (ref 30.0–36.0)
MCV: 94.3 fL (ref 80.0–100.0)
Monocytes Absolute: 0.8 10*3/uL (ref 0.1–1.0)
Monocytes Relative: 14 %
Neutro Abs: 4.1 10*3/uL (ref 1.7–7.7)
Neutrophils Relative %: 72 %
Platelets: 213 10*3/uL (ref 150–400)
RBC: 4.36 MIL/uL (ref 4.22–5.81)
RDW: 14.1 % (ref 11.5–15.5)
WBC: 5.7 10*3/uL (ref 4.0–10.5)
nRBC: 0 % (ref 0.0–0.2)

## 2018-08-24 LAB — CK: Total CK: 574 U/L — ABNORMAL HIGH (ref 49–397)

## 2018-08-24 LAB — HEPATIC FUNCTION PANEL
ALT: 349 U/L — ABNORMAL HIGH (ref 0–44)
AST: 484 U/L — AB (ref 15–41)
Albumin: 4.1 g/dL (ref 3.5–5.0)
Alkaline Phosphatase: 141 U/L — ABNORMAL HIGH (ref 38–126)
Bilirubin, Direct: 1.3 mg/dL — ABNORMAL HIGH (ref 0.0–0.2)
Indirect Bilirubin: 1.8 mg/dL — ABNORMAL HIGH (ref 0.3–0.9)
Total Bilirubin: 3.1 mg/dL — ABNORMAL HIGH (ref 0.3–1.2)
Total Protein: 8.7 g/dL — ABNORMAL HIGH (ref 6.5–8.1)

## 2018-08-24 LAB — POTASSIUM: POTASSIUM: 5.5 mmol/L — AB (ref 3.5–5.1)

## 2018-08-24 LAB — I-STAT TROPONIN, ED: TROPONIN I, POC: 1.05 ng/mL — AB (ref 0.00–0.08)

## 2018-08-24 LAB — TROPONIN I
Troponin I: 1.24 ng/mL (ref <0.03)
Troponin I: 0.81 ng/mL (ref <0.03)

## 2018-08-24 LAB — HEPARIN LEVEL (UNFRACTIONATED): Heparin Unfractionated: <0.1 IU/mL — ABNORMAL LOW (ref 0.30–0.70)

## 2018-08-24 LAB — APTT: aPTT: 169 seconds (ref 24–36)

## 2018-08-24 LAB — LIPASE, BLOOD: Lipase: 23 U/L (ref 11–51)

## 2018-08-24 LAB — CBG MONITORING, ED
Glucose-Capillary: 69 mg/dL — ABNORMAL LOW (ref 70–99)
Glucose-Capillary: 190 mg/dL — ABNORMAL HIGH (ref 70–99)

## 2018-08-24 MED ORDER — HEPARIN (PORCINE) 25000 UT/250ML-% IV SOLN
1800.0000 [IU]/h | INTRAVENOUS | Status: DC
  Administered 2018-08-24: 1150 [IU]/h via INTRAVENOUS
  Administered 2018-08-25: 1500 [IU]/h via INTRAVENOUS
  Filled 2018-08-24 (×2): qty 250

## 2018-08-24 MED ORDER — AZITHROMYCIN 250 MG PO TABS
250.0000 mg | ORAL_TABLET | Freq: Every day | ORAL | Status: AC
  Administered 2018-08-25 – 2018-08-27 (×3): 250 mg via ORAL
  Filled 2018-08-24 (×3): qty 1

## 2018-08-24 MED ORDER — INSULIN ASPART 100 UNIT/ML ~~LOC~~ SOLN
0.0000 [IU] | Freq: Three times a day (TID) | SUBCUTANEOUS | Status: DC
  Administered 2018-08-25 (×2): 5 [IU] via SUBCUTANEOUS
  Administered 2018-08-25 – 2018-08-26 (×2): 3 [IU] via SUBCUTANEOUS
  Administered 2018-08-26 (×2): 5 [IU] via SUBCUTANEOUS
  Administered 2018-08-27: 3 [IU] via SUBCUTANEOUS

## 2018-08-24 MED ORDER — INSULIN ASPART 100 UNIT/ML ~~LOC~~ SOLN
0.0000 [IU] | Freq: Every day | SUBCUTANEOUS | Status: DC
  Administered 2018-08-25: 3 [IU] via SUBCUTANEOUS

## 2018-08-24 MED ORDER — ONDANSETRON HCL 4 MG/2ML IJ SOLN: 4.0000 mg | Freq: Four times a day (QID) | INTRAMUSCULAR | Status: DC | PRN

## 2018-08-24 MED ORDER — IPRATROPIUM-ALBUTEROL 0.5-2.5 (3) MG/3ML IN SOLN
3.0000 mL | Freq: Once | RESPIRATORY_TRACT | Status: AC
  Administered 2018-08-24: 3 mL via RESPIRATORY_TRACT
  Filled 2018-08-24: qty 3

## 2018-08-24 MED ORDER — NICOTINE 21 MG/24HR TD PT24
21.0000 mg | MEDICATED_PATCH | Freq: Every day | TRANSDERMAL | Status: DC
  Administered 2018-08-24 – 2018-09-02 (×10): 21 mg via TRANSDERMAL
  Filled 2018-08-24 (×10): qty 1

## 2018-08-24 MED ORDER — ASPIRIN EC 81 MG PO TBEC
81.0000 mg | DELAYED_RELEASE_TABLET | Freq: Every day | ORAL | Status: DC
  Administered 2018-08-24 – 2018-09-01 (×9): 81 mg via ORAL
  Filled 2018-08-24 (×10): qty 1

## 2018-08-24 MED ORDER — SODIUM CHLORIDE 0.9 % IV BOLUS
1000.0000 mL | Freq: Once | INTRAVENOUS | Status: AC
  Administered 2018-08-24: 1000 mL via INTRAVENOUS

## 2018-08-24 MED ORDER — ALBUTEROL SULFATE (2.5 MG/3ML) 0.083% IN NEBU
2.5000 mg | INHALATION_SOLUTION | Freq: Once | RESPIRATORY_TRACT | Status: AC
  Administered 2018-08-24: 2.5 mg via RESPIRATORY_TRACT
  Filled 2018-08-24: qty 3

## 2018-08-24 MED ORDER — ALBUTEROL SULFATE (2.5 MG/3ML) 0.083% IN NEBU: 2.5000 mg | INHALATION_SOLUTION | RESPIRATORY_TRACT | Status: DC | PRN

## 2018-08-24 MED ORDER — AZITHROMYCIN 250 MG PO TABS
500.0000 mg | ORAL_TABLET | Freq: Every day | ORAL | Status: AC
  Administered 2018-08-24: 500 mg via ORAL
  Filled 2018-08-24: qty 2

## 2018-08-24 MED ORDER — METHYLPREDNISOLONE SODIUM SUCC 125 MG IJ SOLR
60.0000 mg | Freq: Two times a day (BID) | INTRAMUSCULAR | Status: DC
  Administered 2018-08-24 – 2018-08-26 (×4): 60 mg via INTRAVENOUS
  Filled 2018-08-24 (×4): qty 2

## 2018-08-24 MED ORDER — ACETAMINOPHEN 650 MG RE SUPP: 650.0000 mg | Freq: Four times a day (QID) | RECTAL | Status: DC | PRN

## 2018-08-24 MED ORDER — ACETAMINOPHEN 325 MG PO TABS: 650.0000 mg | ORAL_TABLET | Freq: Four times a day (QID) | ORAL | Status: DC | PRN

## 2018-08-24 MED ORDER — MOMETASONE FURO-FORMOTEROL FUM 200-5 MCG/ACT IN AERO
2.0000 | INHALATION_SPRAY | Freq: Two times a day (BID) | RESPIRATORY_TRACT | Status: DC
  Administered 2018-08-24 – 2018-08-27 (×7): 2 via RESPIRATORY_TRACT
  Filled 2018-08-24 (×2): qty 8.8

## 2018-08-24 MED ORDER — SODIUM CHLORIDE 0.9 % IV SOLN
INTRAVENOUS | Status: DC
  Administered 2018-08-24 – 2018-08-27 (×4): via INTRAVENOUS

## 2018-08-24 MED ORDER — ONDANSETRON HCL 4 MG PO TABS
4.0000 mg | ORAL_TABLET | Freq: Four times a day (QID) | ORAL | Status: DC | PRN
  Administered 2018-08-25: 4 mg via ORAL
  Filled 2018-08-24: qty 1

## 2018-08-24 MED ORDER — HEPARIN BOLUS VIA INFUSION
4000.0000 [IU] | Freq: Once | INTRAVENOUS | Status: AC
  Administered 2018-08-24: 4000 [IU] via INTRAVENOUS

## 2018-08-24 MED ORDER — IPRATROPIUM-ALBUTEROL 0.5-2.5 (3) MG/3ML IN SOLN
3.0000 mL | Freq: Four times a day (QID) | RESPIRATORY_TRACT | Status: DC
  Administered 2018-08-24 – 2018-08-25 (×5): 3 mL via RESPIRATORY_TRACT
  Filled 2018-08-24 (×6): qty 3

## 2018-08-24 MED ORDER — GUAIFENESIN ER 600 MG PO TB12
1200.0000 mg | ORAL_TABLET | Freq: Two times a day (BID) | ORAL | Status: DC
  Administered 2018-08-24 – 2018-08-30 (×7): 1200 mg via ORAL
  Filled 2018-08-24 (×14): qty 2

## 2018-08-24 NOTE — ED Notes (Signed)
Pt given meal tray.

## 2018-08-24 NOTE — H&P (Signed)
History and Physical    Clayton [name] DVV:[id number] DOB: 05-18-1951 DOA: 08/24/2018  PCP: Dione Housekeeper, MD  Patient coming from: Home  I have personally briefly reviewed patient's old medical records in [place]  Chief Complaint: Shortness of breath  HPI: Clayton [name] is a 67 y.o. male with medical history significant of COPD on home oxygen, tobacco use, hypertension.  Patient reports that he is chronically short of breath.  His family notes that over the past week to 2 weeks, he has been increasingly short of breath.  He does have a cough.  Family has noticed more wheezing.  He has not had any fever.  Last week, he developed a GI illness with significant diarrhea.  He also had nausea and some vomiting.  Stools were brown, no melena or hematochezia was reported.  His p.o. intake has remained poor and he noticed diminished urine output.  His symptoms have continued into this week.  His family checked on him today and found him laying on the bathroom floor.  He denies any chest pain.  ED Course: He was noted to be hypoxic in the emergency room and received bronchodilators.  EKG did not show any acute changes.  Point-of-care troponin positive at 1.05.  Creatinine elevated at greater than 4.  Baseline creatinine is normal.  Chest x-ray did not show any obvious pneumonia or CHF.  Liver enzymes also elevated as well as bilirubin.  Potassium elevated at 5.9, improved to 5.5 with IV fluids.  Review of Systems: As per HPI otherwise 10 point review of systems negative.    Past Medical History:  Diagnosis Date  . COPD (chronic obstructive pulmonary disease) ([place])   . Diabetes mellitus   . GERD (gastroesophageal reflux disease)   . Guaiac positive stools 07/03/2018  . HTN (hypertension)   . Hyperlipidemia   . Myocardial infarction ([place]) 2009  . Prostate cancer ([place])   . PVD (peripheral vascular disease) ([place])     Past Surgical History:  Procedure Laterality Date  . CARDIAC  CATHETERIZATION     with stent  . cardiac stents  2009  . CATARACT EXTRACTION W/PHACO Left 08/19/2013   Procedure: CATARACT EXTRACTION PHACO AND INTRAOCULAR LENS PLACEMENT (IOC);  Surgeon: Tonny Branch, MD;  Location: [facility];  Service: Ophthalmology;  Laterality: Left;  CDE:37.77  . CATARACT EXTRACTION W/PHACO Right 07/29/2016   Procedure: CATARACT EXTRACTION PHACO AND INTRAOCULAR LENS PLACEMENT RIGHT EYE; CDE:  18.10;  Surgeon: Tonny Branch, MD;  Location: [facility];  Service: Ophthalmology;  Laterality: Right;    Social History:  reports that he has been smoking cigarettes. He started smoking about 49 years ago. He has a 42.00 pack-year smoking history. He has never used smokeless tobacco. He reports that he drinks alcohol. He reports that he does not use drugs.  No Known Allergies  Family History  Problem Relation Age of Onset  . Coronary artery disease Mother   . Coronary artery disease Father   . Prostate cancer Father      Prior to Admission medications   Medication Sig Start Date End Date Taking? Authorizing Provider  albuterol (PROVENTIL HFA;VENTOLIN HFA) 108 (90 BASE) MCG/ACT inhaler Inhale 2 puffs into the lungs every 6 (six) hours as needed for wheezing or shortness of breath. Please instruct in proper technique. 06/29/15  Yes Samuella Cota, MD  amLODipine (NORVASC) 5 MG tablet Take 5 mg by mouth daily.   Yes [provider]  carvedilol (COREG) 6.25 MG tablet Take 1 tablet  by mouth 2 (two) times daily. 01/06/16  Yes [provider]  Fluticasone-Salmeterol (ADVAIR DISKUS) 250-50 MCG/DOSE AEPB Inhale 1 puff into the lungs 2 (two) times daily. 05/17/17 08/24/18 Yes Orvan Falconer, MD  glipiZIDE (GLUCOTROL) 10 MG tablet Take 10 mg by mouth 2 (two) times daily.   Yes [provider]  metFORMIN (GLUCOPHAGE) 1000 MG tablet Take 1 tablet by mouth 2 (two) times daily. 05/14/15  Yes [provider]  ondansetron (ZOFRAN) 4 MG tablet Take 4 mg by mouth every 8  (eight) hours as needed for nausea or vomiting.   Yes [provider]  sildenafil (REVATIO) 20 MG tablet Take 2-5 tablets by mouth daily as needed (intercourse).  01/08/16   [provider]    Physical Exam: Vitals:   08/24/18 1700 08/24/18 1730 08/24/18 1800 08/24/18 1830  BP: 130/77 (!) 145/81 131/79 (!) 137/91  Pulse:  77 78 83  Resp: 10 (!) 29 (!) 27 (!) 23  Temp:      TempSrc:      SpO2:  (!) 88% (!) 89% 90%  Weight:      Height:        Constitutional: NAD, calm, comfortable Eyes: PERRL, lids and conjunctivae normal ENMT: Mucous membranes are dry. Posterior pharynx clear of any exudate or lesions. poor dentition.  Neck: normal, supple, no masses, no thyromegaly Respiratory: Diminished breath sounds bilaterally, coarse breath sounds at bases. Normal respiratory effort. No accessory muscle use.  Cardiovascular: Regular rate and rhythm, no murmurs / rubs / gallops. No extremity edema. 2+ pedal pulses. No carotid bruits.  Abdomen: Mild tenderness in the right upper quadrant, no masses palpated. No hepatosplenomegaly. Bowel sounds positive.  Musculoskeletal: no clubbing / cyanosis. No joint deformity upper and lower extremities. Good ROM, no contractures. Normal muscle tone.  Skin: no rashes, lesions, ulcers. No induration Neurologic: CN 2-12 grossly intact. Sensation intact, DTR normal. Strength 5/5 in all 4.  Psychiatric: Normal judgment and insight. Alert and oriented x 3. Normal mood.    Labs on Admission: I have personally reviewed following labs and imaging studies  CBC: Recent Labs  Lab 08/24/18 1137 08/24/18 1149  WBC 5.7  --   NEUTROABS 4.1  --   HGB 12.5* 14.6  HCT 41.1 43.0  MCV 94.3  --   PLT 213  --    Basic Metabolic Panel: Recent Labs  Lab 08/24/18 1149 08/24/18 1408  NA 136  --   K 5.9* 5.5*  CL 100  --   GLUCOSE 78  --   BUN 55*  --   CREATININE 4.20*  --    GFR: Estimated Creatinine Clearance: 20.8 mL/min (A) (by C-G formula  based on SCr of 4.2 mg/dL (H)). Liver Function Tests: Recent Labs  Lab 08/24/18 1137  AST 484*  ALT 349*  ALKPHOS 141*  BILITOT 3.1*  PROT 8.7*  ALBUMIN 4.1   Recent Labs  Lab 08/24/18 1137  LIPASE 23   No results for input(s): AMMONIA in the last 168 hours. Coagulation Profile: No results for input(s): INR, PROTIME in the last 168 hours. Cardiac Enzymes: Recent Labs  Lab 08/24/18 1146 08/24/18 1644  CKTOTAL 574*  --   TROPONINI  --  1.24*   BNP (last 3 results) No results for input(s): PROBNP in the last 8760 hours. HbA1C: No results for input(s): HGBA1C in the last 72 hours. CBG: Recent Labs  Lab 08/24/18 1844  GLUCAP 69*   Lipid Profile: No results for input(s): CHOL, HDL,  LDLCALC, TRIG, CHOLHDL, LDLDIRECT in the last 72 hours. Thyroid Function Tests: No results for input(s): TSH, T4TOTAL, FREET4, T3FREE, THYROIDAB in the last 72 hours. Anemia Panel: No results for input(s): VITAMINB12, FOLATE, FERRITIN, TIBC, IRON, RETICCTPCT in the last 72 hours. Urine analysis:    Component Value Date/Time   COLORURINE YELLOW 12/07/2009 1021   APPEARANCEUR CLEAR 12/07/2009 1021   LABSPEC >=1.030 12/07/2009 1021   PHURINE 5.0 12/07/2009 1021   GLUCOSEU NEGATIVE 12/07/2009 1021   BILIRUBINUR NEGATIVE 12/07/2009 1021   KETONESUR NEGATIVE 12/07/2009 1021   UROBILINOGEN 1.0 12/07/2009 1021   NITRITE NEGATIVE 12/07/2009 1021   LEUKOCYTESUR TRACE 12/07/2009 1021    Radiological Exams on Admission: Ct Head Wo Contrast  Result Date: 08/24/2018 CLINICAL DATA:  Altered level of consciousness. EXAM: CT HEAD WITHOUT CONTRAST TECHNIQUE: Contiguous axial images were obtained from the base of the skull through the vertex without intravenous contrast. COMPARISON:  None. FINDINGS: Brain: Mild to moderate atrophy. Hypodensity in the cerebral white matter bilaterally most likely chronic. Negative for acute infarct, hemorrhage, or mass. Vascular: Atherosclerotic calcification without  acute arterial thrombosis. Skull: Negative Sinuses/Orbits: Paranasal sinuses clear. Bilateral cataract surgery. Other: None IMPRESSION: Atrophy and chronic microvascular ischemic changes in the white matter. No acute abnormality. Electronically Signed   By: Franchot Gallo M.D.   On: 08/24/2018 12:54   Dg Knee Complete 4 Views Right  Result Date: 08/24/2018 CLINICAL DATA:  Right knee pain following an unwitnessed fall. EXAM: RIGHT KNEE - COMPLETE 4+ VIEW COMPARISON:  None. FINDINGS: The bones are subjectively adequately mineralized. There's mild narrowing of the medial joint compartment. There's mild beaking of the tibial spines. There's no acute or healing fracture. A small suprapatellar effusion is suspected. IMPRESSION: There's no acute fracture nor dislocation of the right knee. Mild degenerative narrowing of the medial compartment is present. Electronically Signed   By: David  Martinique M.D.   On: 08/24/2018 12:23   Dg Abd Acute W/chest  Result Date: 08/24/2018 CLINICAL DATA:  One week of shortness of breath. The face was found on the floor this morning and complains of right knee and left lower ribcage pain secondary to a fall. EXAM: DG ABDOMEN ACUTE W/ 1V CHEST COMPARISON:  Chest x-ray of May 13, 2017 FINDINGS: The lungs are well-expanded. The interstitial markings are chronically increased. The heart is mildly enlarged but stable. The pulmonary vascularity is not clearly engorged. The lateral left lower ribs are excluded from the field of view. Within the abdomen the bowel gas pattern is within the limits of normal. The stool burden is moderate. The observed bony structures of the abdomen and pelvis exhibit no acute abnormalities. IMPRESSION: Chronic bronchitic changes, stable. No alveolar pneumonia nor pulmonary edema. No pneumothorax nor large pleural effusion. The the mid and lower left ribs cannot be completely assessed due to patient positioning. Within the abdomen no acute abnormality is  observed. The observed bony structures exhibit no acute abnormalities. Electronically Signed   By: David  Martinique M.D.   On: 08/24/2018 12:21    EKG: Independently reviewed.  Sinus rhythm without any acute changes  Assessment/Plan Active Problems:   TOBACCO ABUSE   COPD exacerbation ([facility])   DM type 2 (diabetes mellitus, type 2) ([facility])   Acute on chronic respiratory failure with hypoxia ([facility])   Elevated troponin   Elevated LFTs   AKI (acute kidney injury) ([place])   Acute gastroenteritis   HTN (hypertension)   Dehydration     1. Acute on chronic respiratory failure.  Likely related to COPD exacerbation.  No obvious pneumonia on imaging.  Start the patient on bronchodilators, intravenous steroids and oral azithromycin.  Will continue on inhaled steroids. 2. Acute kidney injury.  Related to dehydration from persistent GI loss.  Start on IV hydration.  Check renal ultrasound. 3. Acute gastroenteritis.  Patient denies any recent antibiotics.  Treat supportively.  Will check stool for C. difficile and GI pathogen panel. 4. Elevated troponin.  No complaints of chest pain.  EKG does not show any acute changes.  Discussed with cardiology and it is felt that his troponin could be related to demand ischemia.  Recommendations are started on intravenous heparin for now.  Continue on aspirin.  They will consult in the morning.  We will also check echocardiogram. 5. Diabetes.  Hold metformin glipizide.  Start on sliding scale insulin. 6. Lactic acidosis.  Related to dehydration as well as concurrent metformin use.  Continue on IV hydration. 7. Hypertension.  Will hold amlodipine and Coreg for now until he has been volume resuscitated. 8. Elevated liver enzymes.  Possibly related to volume depletion and dehydration.  Will recheck in a.m.  Check right upper quadrant ultrasound.  DVT prophylaxis: On IV heparin Code Status: Full code Family Communication: Discussed with family at the bedside Disposition  Plan: Discharge home once improved Consults called: Cardiology Admission status: Inpatient, stepdown  Kathie Dike MD [facility] Pager [phone]  If 7PM-7AM, please contact night-coverage [email or web address] Password [facility]  08/24/2018, 7:43 PM

## 2018-08-24 NOTE — ED Notes (Signed)
Pt sleeping soundly. Pt O2 sats ranging from 82-85%.

## 2018-08-24 NOTE — ED Notes (Signed)
Date and time results received: 08/24/18 1737 (use smartphrase ".now" to insert current time)  Test: Troponin Critical Value: 1.24 Name of Provider Notified: Dr Roderic Palau Orders Received? Or Actions Taken?:NA

## 2018-08-24 NOTE — ED Provider Notes (Signed)
[facility] EMERGENCY DEPARTMENT Provider Note   CSN: [id number] Arrival date & time: 08/24/18  1031     History   Chief Complaint Chief Complaint  Patient presents with  . Loss of Consciousness    HPI Clayton [name] is a 67 y.o. male.  Patient complains of diarrhea for a number of days and then he passed out on the floor.  The history is provided by the patient. No language interpreter was used.  Loss of Consciousness   This is a new problem. The current episode started 3 to 5 hours ago. The problem occurs rarely. The problem has been resolved. He lost consciousness for a period of greater than 5 minutes. The problem is associated with normal activity. Associated symptoms include visual change and weakness. Pertinent negatives include abdominal pain, back pain, chest pain, congestion, headaches and seizures. He has tried nothing for the symptoms. The treatment provided no relief. His past medical history is significant for CAD.    Past Medical History:  Diagnosis Date  . COPD (chronic obstructive pulmonary disease) ([place])   . Diabetes mellitus   . GERD (gastroesophageal reflux disease)   . Guaiac positive stools 07/03/2018  . HTN (hypertension)   . Hyperlipidemia   . Myocardial infarction ([place]) 2009  . Prostate cancer ([place])   . PVD (peripheral vascular disease) [facility])     Patient Active Problem List   Diagnosis Date Noted  . Guaiac positive stools 07/03/2018  . Acute on chronic respiratory failure with hypoxia ([place]) 05/13/2017  . Elevated troponin 05/13/2017  . COPD exacerbation ([place]) 06/28/2015  . DM type 2 (diabetes mellitus, type 2) ([place]) 06/28/2015  . Erectile dysfunction associated with type 2 diabetes mellitus ([place]) 07/12/2014  . CAD S/P percutaneous coronary angioplasty 04/11/2014  . Prostate cancer ([place]) 04/27/2012  . Abnormal ECG 01/06/2012  . TOBACCO ABUSE 11/23/2010  . GERD 12/12/2009  . DIVERTICULOSIS, COLON 12/12/2009  . COLONIC POLYPS, BENIGN, HX OF  12/12/2009  . ANEMIA 12/04/2009  . HYPERLIPIDEMIA 09/15/2008  . MYOCARDIAL INFARCTION, HX OF 09/15/2008  . PERIPHERAL VASCULAR DISEASE 09/15/2008  . UNSPECIFIED IRON DEFICIENCY ANEMIA 02/15/2008  . BLOOD IN STOOL, OCCULT 02/15/2008    Past Surgical History:  Procedure Laterality Date  . CARDIAC CATHETERIZATION     with stent  . cardiac stents  2009  . CATARACT EXTRACTION W/PHACO Left 08/19/2013   Procedure: CATARACT EXTRACTION PHACO AND INTRAOCULAR LENS PLACEMENT (IOC);  Surgeon: Tonny Branch, MD;  Location: [facility];  Service: Ophthalmology;  Laterality: Left;  CDE:37.77  . CATARACT EXTRACTION W/PHACO Right 07/29/2016   Procedure: CATARACT EXTRACTION PHACO AND INTRAOCULAR LENS PLACEMENT RIGHT EYE; CDE:  18.10;  Surgeon: Tonny Branch, MD;  Location: [facility];  Service: Ophthalmology;  Laterality: Right;        Home Medications    Prior to Admission medications   Medication Sig Start Date End Date Taking? Authorizing Provider  albuterol (PROVENTIL HFA;VENTOLIN HFA) 108 (90 BASE) MCG/ACT inhaler Inhale 2 puffs into the lungs every 6 (six) hours as needed for wheezing or shortness of breath. Please instruct in proper technique. 06/29/15   Samuella Cota, MD  amLODipine (NORVASC) 5 MG tablet Take 5 mg by mouth daily.    [provider]  aspirin 81 MG tablet Take 81 mg by mouth. When he remembers    [provider]  carvedilol (COREG) 6.25 MG tablet Take 1 tablet by mouth 2 (two) times daily. 01/06/16   [provider]  Fluticasone-Salmeterol (ADVAIR DISKUS) 250-50 MCG/DOSE  AEPB Inhale 1 puff into the lungs 2 (two) times daily. 05/17/17 05/17/18  Orvan Falconer, MD  furosemide (LASIX) 20 MG tablet Take 20 mg by mouth daily.    [provider]  glipiZIDE (GLUCOTROL) 10 MG tablet Take 10 mg by mouth 2 (two) times daily.    [provider]  lisinopril (PRINIVIL,ZESTRIL) 5 MG tablet Take 5 mg by mouth daily.    [provider]  metFORMIN (GLUCOPHAGE)  1000 MG tablet Take 1 tablet by mouth 2 (two) times daily. 05/14/15   [provider]  sildenafil (REVATIO) 20 MG tablet Take 2-5 tablets by mouth daily as needed (intercourse).  01/08/16   [provider]    Family History Family History  Problem Relation Age of Onset  . Coronary artery disease Mother   . Coronary artery disease Father   . Prostate cancer Father     Social History Social History   Tobacco Use  . Smoking status: Current Every Day Smoker    Packs/day: 1.00    Years: 42.00    Pack years: 42.00    Types: Cigarettes    Start date: 07/07/1969  . Smokeless tobacco: Never Used  Substance Use Topics  . Alcohol use: Yes    Alcohol/week: 0.0 standard drinks    Comment: rarely  . Drug use: No     Allergies   Patient has no known allergies.   Review of Systems Review of Systems  Constitutional: Negative for appetite change and fatigue.  HENT: Negative for congestion, ear discharge and sinus pressure.   Eyes: Negative for discharge.  Respiratory: Negative for cough.   Cardiovascular: Positive for syncope. Negative for chest pain.  Gastrointestinal: Positive for diarrhea. Negative for abdominal pain.  Genitourinary: Negative for frequency and hematuria.  Musculoskeletal: Negative for back pain.  Skin: Negative for rash.  Neurological: Positive for weakness. Negative for seizures and headaches.  Psychiatric/Behavioral: Negative for hallucinations.     Physical Exam Updated Vital Signs BP 123/64   Pulse 72   Temp 97.9 F (36.6 C) (Oral)   Resp (!) 23   Ht 6' (1.829 m)   Wt 95.7 kg   SpO2 94%   BMI 28.62 kg/m   Physical Exam  Constitutional: He is oriented to person, place, and time. He appears well-developed.  HENT:  Head: Normocephalic.  Eyes: Conjunctivae and EOM are normal. No scleral icterus.  Neck: Neck supple. No thyromegaly present.  Cardiovascular: Normal rate and regular rhythm. Exam reveals no gallop and no friction rub.   No murmur heard. Pulmonary/Chest: No stridor. He has no wheezes. He has no rales. He exhibits no tenderness.  Abdominal: He exhibits no distension. There is no tenderness. There is no rebound.  Musculoskeletal: Normal range of motion. He exhibits no edema.  Lymphadenopathy:    He has no cervical adenopathy.  Neurological: He is oriented to person, place, and time. He exhibits normal muscle tone. Coordination normal.  Skin: No rash noted. No erythema.  Psychiatric: He has a normal mood and affect. His behavior is normal.     ED Treatments / Results  Labs (all labs ordered are listed, but only abnormal results are displayed) Labs Reviewed  CBC WITH DIFFERENTIAL/PLATELET - Abnormal; Notable for the following components:      Result Value   Hemoglobin 12.5 (*)    All other components within normal limits  HEPATIC FUNCTION PANEL - Abnormal; Notable for the following components:   Total Protein 8.7 (*)    AST 484 (*)  ALT 349 (*)    Alkaline Phosphatase 141 (*)    Total Bilirubin 3.1 (*)    Bilirubin, Direct 1.3 (*)    Indirect Bilirubin 1.8 (*)    All other components within normal limits  CK - Abnormal; Notable for the following components:   Total CK 574 (*)    All other components within normal limits  I-STAT CHEM 8, ED - Abnormal; Notable for the following components:   Potassium 5.9 (*)    BUN 55 (*)    Creatinine, Ser 4.20 (*)    Calcium, Ion 1.06 (*)    All other components within normal limits  I-STAT TROPONIN, ED - Abnormal; Notable for the following components:   Troponin i, poc 1.05 (*)    All other components within normal limits  I-STAT CG4 LACTIC ACID, ED - Abnormal; Notable for the following components:   Lactic Acid, Venous 2.42 (*)    All other components within normal limits  I-STAT CG4 LACTIC ACID, ED - Abnormal; Notable for the following components:   Lactic Acid, Venous 2.58 (*)    All other components within normal limits  LIPASE, BLOOD  POTASSIUM    BRAIN NATRIURETIC PEPTIDE    EKG EKG Interpretation  Date/Time:  Monday August 24 2018 10:55:44 EST Ventricular Rate:  74 PR Interval:    QRS Duration: 152 QT Interval:  410 QTC Calculation: 455 R Axis:   98 Text Interpretation:  Sinus rhythm RBBB and LPFB Confirmed by Milton Ferguson [phone]) on 08/24/2018 12:40:52 PM   Radiology Ct Head Wo Contrast  Result Date: 08/24/2018 CLINICAL DATA:  Altered level of consciousness. EXAM: CT HEAD WITHOUT CONTRAST TECHNIQUE: Contiguous axial images were obtained from the base of the skull through the vertex without intravenous contrast. COMPARISON:  None. FINDINGS: Brain: Mild to moderate atrophy. Hypodensity in the cerebral white matter bilaterally most likely chronic. Negative for acute infarct, hemorrhage, or mass. Vascular: Atherosclerotic calcification without acute arterial thrombosis. Skull: Negative Sinuses/Orbits: Paranasal sinuses clear. Bilateral cataract surgery. Other: None IMPRESSION: Atrophy and chronic microvascular ischemic changes in the white matter. No acute abnormality. Electronically Signed   By: Franchot Gallo M.D.   On: 08/24/2018 12:54   Dg Knee Complete 4 Views Right  Result Date: 08/24/2018 CLINICAL DATA:  Right knee pain following an unwitnessed fall. EXAM: RIGHT KNEE - COMPLETE 4+ VIEW COMPARISON:  None. FINDINGS: The bones are subjectively adequately mineralized. There's mild narrowing of the medial joint compartment. There's mild beaking of the tibial spines. There's no acute or healing fracture. A small suprapatellar effusion is suspected. IMPRESSION: There's no acute fracture nor dislocation of the right knee. Mild degenerative narrowing of the medial compartment is present. Electronically Signed   By: David  Martinique M.D.   On: 08/24/2018 12:23   Dg Abd Acute W/chest  Result Date: 08/24/2018 CLINICAL DATA:  One week of shortness of breath. The face was found on the floor this morning and complains of right knee and  left lower ribcage pain secondary to a fall. EXAM: DG ABDOMEN ACUTE W/ 1V CHEST COMPARISON:  Chest x-ray of May 13, 2017 FINDINGS: The lungs are well-expanded. The interstitial markings are chronically increased. The heart is mildly enlarged but stable. The pulmonary vascularity is not clearly engorged. The lateral left lower ribs are excluded from the field of view. Within the abdomen the bowel gas pattern is within the limits of normal. The stool burden is moderate. The observed bony structures of the abdomen and pelvis exhibit no acute  abnormalities. IMPRESSION: Chronic bronchitic changes, stable. No alveolar pneumonia nor pulmonary edema. No pneumothorax nor large pleural effusion. The the mid and lower left ribs cannot be completely assessed due to patient positioning. Within the abdomen no acute abnormality is observed. The observed bony structures exhibit no acute abnormalities. Electronically Signed   By: David  Martinique M.D.   On: 08/24/2018 12:21    Procedures Procedures (including critical care time)  Medications Ordered in ED Medications  sodium chloride 0.9 % bolus 1,000 mL (1,000 mLs Intravenous New Bag/Given 08/24/18 1136)  ipratropium-albuterol (DUONEB) 0.5-2.5 (3) MG/3ML nebulizer solution 3 mL (3 mLs Nebulization Given 08/24/18 1333)  albuterol (PROVENTIL) (2.5 MG/3ML) 0.083% nebulizer solution 2.5 mg (2.5 mg Nebulization Given 08/24/18 1337)     Initial Impression / Assessment and Plan / ED Course  I have reviewed the triage vital signs and the nursing notes.  Pertinent labs & imaging results that were available during my care of the patient were reviewed by me and considered in my medical decision making (see chart for details).     CRITICAL CARE Performed by: Milton Ferguson Total critical care time: 40 minutes Critical care time was exclusive of separately billable procedures and treating other patients. Critical care was necessary to treat or prevent imminent or  life-threatening deterioration. Critical care was time spent personally by me on the following activities: development of treatment plan with patient and/or surrogate as well as nursing, discussions with consultants, evaluation of patient's response to treatment, examination of patient, obtaining history from patient or surrogate, ordering and performing treatments and interventions, ordering and review of laboratory studies, ordering and review of radiographic studies, pulse oximetry and re-evaluation of patient's condition.  Patient with renal failure elevated troponin hyperkalemia hypoxia with COPD exacerbation patient will be admitted to medicine Final Clinical Impressions(s) / ED Diagnoses   Final diagnoses:  Syncope and collapse    ED Discharge Orders    None       Milton Ferguson, MD 08/24/18 1358

## 2018-08-24 NOTE — ED Notes (Signed)
Date and time results received: 08/24/18 1:02 PM  (use smartphrase ".now" to insert current time)  Test: istat lactic Critical Value: 2.58  Name of Provider Notified: [place]  Orders Received? Or Actions Taken?: Orders Received - See Orders for details

## 2018-08-24 NOTE — ED Notes (Signed)
Respiratory called and informed pt O2 sats decreasing to 83-85% while pt sleeping. Per Respiratory, will assess.

## 2018-08-24 NOTE — ED Notes (Addendum)
Date and time results received: 08/24/18 7:51 PM (use smartphrase ".now" to insert current time)  Test: ptt Critical Value: 169  Name of Provider Notified: floor coverage   Orders Received? Or Actions Taken?: none at this time

## 2018-08-24 NOTE — ED Notes (Signed)
Respiratory called

## 2018-08-24 NOTE — ED Triage Notes (Addendum)
Patient complaining of shortness of breath x 1 week. Family member with patient states "they found him in the floor at home this morning. We called 911 but they said nothing was abnormal." Patient states "I remember trying to go to the bathroom and I must have fallen asleep, I guess I was crawling." Patient complaining of right knee pain and left lower rib pain "from fall."

## 2018-08-24 NOTE — Progress Notes (Signed)
ANTICOAGULATION CONSULT NOTE - Initial Consult  Pharmacy Consult for Heparin Indication: chest pain/ACS  No Known Allergies  Patient Measurements: Height: 6' (182.9 cm) Weight: 211 lb (95.7 kg) IBW/kg (Calculated) : 77.6 HEPARIN DW (KG): 95.7  Vital Signs: Temp: 97.9 F (36.6 C) (12/09 1042) Temp Source: Oral (12/09 1042) BP: 127/71 (12/09 1530) Pulse Rate: 66 (12/09 1430)  Labs: Recent Labs    08/24/18 1137 08/24/18 1146 08/24/18 1149  HGB 12.5*  --  14.6  HCT 41.1  --  43.0  PLT 213  --   --   CREATININE  --   --  4.20*  CKTOTAL  --  574*  --     Estimated Creatinine Clearance: 20.8 mL/min (A) (by C-G formula based on SCr of 4.2 mg/dL (H)).   Medical History: Past Medical History:  Diagnosis Date  . COPD (chronic obstructive pulmonary disease) ([place])   . Diabetes mellitus   . GERD (gastroesophageal reflux disease)   . Guaiac positive stools 07/03/2018  . HTN (hypertension)   . Hyperlipidemia   . Myocardial infarction ([place]) 2009  . Prostate cancer ([place])   . PVD (peripheral vascular disease) ([place])     Medications:  See med rec  Assessment: Patient presented to ED with complaints of loss of consciousness. Patient with know cardiac history. Pharmacy asked to start heparin for ACS/chest pain.  Goal of Therapy:  Heparin level 0.3-0.7 units/ml Monitor platelets by anticoagulation protocol: Yes   Plan:  Give 4000 units bolus x 1 Start heparin infusion at 1150 units/hr Check anti-Xa level in 6-8 hours and daily while on heparin Continue to monitor H&H and platelets  Isac Sarna, BS Vena [place], BCPS Clinical Pharmacist Pager [phone] 08/24/2018,4:01 PM

## 2018-08-25 ENCOUNTER — Inpatient Hospital Stay (HOSPITAL_COMMUNITY): Payer: Medicare Other

## 2018-08-25 ENCOUNTER — Encounter (HOSPITAL_COMMUNITY): Payer: Self-pay

## 2018-08-25 ENCOUNTER — Other Ambulatory Visit: Payer: Self-pay

## 2018-08-25 DIAGNOSIS — N179 Acute kidney failure, unspecified: Secondary | ICD-10-CM

## 2018-08-25 DIAGNOSIS — R7989 Other specified abnormal findings of blood chemistry: Secondary | ICD-10-CM

## 2018-08-25 DIAGNOSIS — I2510 Atherosclerotic heart disease of native coronary artery without angina pectoris: Secondary | ICD-10-CM

## 2018-08-25 DIAGNOSIS — R945 Abnormal results of liver function studies: Secondary | ICD-10-CM

## 2018-08-25 DIAGNOSIS — I10 Essential (primary) hypertension: Secondary | ICD-10-CM

## 2018-08-25 DIAGNOSIS — E860 Dehydration: Secondary | ICD-10-CM

## 2018-08-25 DIAGNOSIS — K529 Noninfective gastroenteritis and colitis, unspecified: Secondary | ICD-10-CM

## 2018-08-25 DIAGNOSIS — I361 Nonrheumatic tricuspid (valve) insufficiency: Secondary | ICD-10-CM

## 2018-08-25 DIAGNOSIS — I248 Other forms of acute ischemic heart disease: Secondary | ICD-10-CM

## 2018-08-25 DIAGNOSIS — F17200 Nicotine dependence, unspecified, uncomplicated: Secondary | ICD-10-CM

## 2018-08-25 DIAGNOSIS — Z9861 Coronary angioplasty status: Secondary | ICD-10-CM

## 2018-08-25 LAB — CBC
HCT: 37.8 % — ABNORMAL LOW (ref 39.0–52.0)
Hemoglobin: 11.4 g/dL — ABNORMAL LOW (ref 13.0–17.0)
MCH: 28.5 pg (ref 26.0–34.0)
MCHC: 30.2 g/dL (ref 30.0–36.0)
MCV: 94.5 fL (ref 80.0–100.0)
Platelets: 178 10*3/uL (ref 150–400)
RBC: 4 MIL/uL — ABNORMAL LOW (ref 4.22–5.81)
RDW: 14.2 % (ref 11.5–15.5)
WBC: 5 10*3/uL (ref 4.0–10.5)
nRBC: 0 % (ref 0.0–0.2)

## 2018-08-25 LAB — C DIFFICILE QUICK SCREEN W PCR REFLEX
C Diff antigen: NEGATIVE
C Diff interpretation: NOT DETECTED
C Diff toxin: NEGATIVE

## 2018-08-25 LAB — COMPREHENSIVE METABOLIC PANEL
ALK PHOS: 116 U/L (ref 38–126)
ALT: 308 U/L — ABNORMAL HIGH (ref 0–44)
ANION GAP: 9 (ref 5–15)
AST: 281 U/L — ABNORMAL HIGH (ref 15–41)
Albumin: 3.4 g/dL — ABNORMAL LOW (ref 3.5–5.0)
BILIRUBIN TOTAL: 2.3 mg/dL — AB (ref 0.3–1.2)
BUN: 59 mg/dL — ABNORMAL HIGH (ref 8–23)
CALCIUM: 8.3 mg/dL — AB (ref 8.9–10.3)
CO2: 24 mmol/L (ref 22–32)
Chloride: 103 mmol/L (ref 98–111)
Creatinine, Ser: 2.37 mg/dL — ABNORMAL HIGH (ref 0.61–1.24)
GFR calc Af Amer: 32 mL/min — ABNORMAL LOW (ref >60)
GFR calc non Af Amer: 28 mL/min — ABNORMAL LOW (ref >60)
GLUCOSE: 176 mg/dL — AB (ref 70–99)
Potassium: 5.6 mmol/L — ABNORMAL HIGH (ref 3.5–5.1)
Sodium: 136 mmol/L (ref 135–145)
TOTAL PROTEIN: 7.3 g/dL (ref 6.5–8.1)

## 2018-08-25 LAB — TROPONIN I: Troponin I: 0.59 ng/mL (ref <0.03)

## 2018-08-25 LAB — GLUCOSE, CAPILLARY
Glucose-Capillary: 146 mg/dL — ABNORMAL HIGH (ref 70–99)
Glucose-Capillary: 184 mg/dL — ABNORMAL HIGH (ref 70–99)
Glucose-Capillary: 225 mg/dL — ABNORMAL HIGH (ref 70–99)
Glucose-Capillary: 232 mg/dL — ABNORMAL HIGH (ref 70–99)
Glucose-Capillary: 257 mg/dL — ABNORMAL HIGH (ref 70–99)

## 2018-08-25 LAB — HEPARIN LEVEL (UNFRACTIONATED): Heparin Unfractionated: 0.12 IU/mL — ABNORMAL LOW (ref 0.30–0.70)

## 2018-08-25 LAB — MRSA PCR SCREENING: MRSA by PCR: NEGATIVE

## 2018-08-25 LAB — ECHOCARDIOGRAM COMPLETE
Height: 72 in
WEIGHTICAEL: 3273.39 [oz_av]

## 2018-08-25 LAB — HIV ANTIBODY (ROUTINE TESTING W REFLEX): HIV Screen 4th Generation wRfx: NONREACTIVE

## 2018-08-25 MED ORDER — HEPARIN SODIUM (PORCINE) 5000 UNIT/ML IJ SOLN
5000.0000 [IU] | Freq: Three times a day (TID) | INTRAMUSCULAR | Status: DC
  Administered 2018-08-25 – 2018-09-04 (×28): 5000 [IU] via SUBCUTANEOUS
  Filled 2018-08-25 (×28): qty 1

## 2018-08-25 MED ORDER — SODIUM ZIRCONIUM CYCLOSILICATE 10 G PO PACK
10.0000 g | PACK | Freq: Once | ORAL | Status: AC
  Administered 2018-08-25: 10 g via ORAL
  Filled 2018-08-25: qty 1

## 2018-08-25 MED ORDER — HEPARIN BOLUS VIA INFUSION
2000.0000 [IU] | Freq: Once | INTRAVENOUS | Status: AC
  Administered 2018-08-25: 2000 [IU] via INTRAVENOUS

## 2018-08-25 MED ORDER — HEPARIN BOLUS VIA INFUSION
3000.0000 [IU] | Freq: Once | INTRAVENOUS | Status: AC
  Administered 2018-08-25: 3000 [IU] via INTRAVENOUS
  Filled 2018-08-25: qty 3000

## 2018-08-25 NOTE — Progress Notes (Signed)
PROGRESS NOTE    Clayton [name]  GYK:[id number] DOB: 05-27-1951 DOA: 08/24/2018 PCP: Dione Housekeeper, MD    Brief Narrative:  67 year old male with a history of COPD on home oxygen, presented to the hospital with shortness of breath.  He has been having vomiting and diarrhea for several days prior to admission with decreased p.o. intake.  He has been found on the floor of his bathroom by his family.  He was noted to be in acute renal failure, was dehydrated and had COPD exacerbation.   Assessment & Plan:   Active Problems:   TOBACCO ABUSE   CAD S/P percutaneous coronary angioplasty   COPD exacerbation (HCC)   DM type 2 (diabetes mellitus, type 2) (HCC)   Acute on chronic respiratory failure with hypoxia (HCC)   Elevated troponin   Elevated LFTs   AKI (acute kidney injury) ([place])   Acute gastroenteritis   HTN (hypertension)   Dehydration   1. Acute on chronic respiratory failure.  Likely related to COPD exacerbation.  He has been up to 10 L of oxygen last night, this is been weaned down to 7 L today.  We will continue to wean as tolerated. 2. COPD exacerbation.  Currently on intravenous steroids, bronchodilators and antibiotics.  Continue to wean steroids as tolerated. 3. Acute kidney injury.  Related to GI losses.  On IV hydration with improvement of creatinine.  No hydronephrosis noted on ultrasound. 4. Acute gastroenteritis.  Patient reported significant diarrhea prior to admission.  Stool studies been ordered, but he is not appear to have any further diarrhea.  If no samples obtained by tomorrow, will discontinue stool studies. 5. Demand ischemia.  Patient had elevated troponin without any complaints of chest pain or EKG changes.  Echocardiogram did not show any wall motion changes.  Initially started on intravenous heparin.  After seen by cardiology, heparin has been discontinued.  Recommendations are for supportive treatment. 6. Diabetes.  Metformin and glipizide currently on  hold.  Continue on sliding scale insulin. 7. Lactic acidosis.  Related to dehydration and metformin use.  Continue hydration. 8. Hypertension.  Hold amlodipine and Coreg for now while he is being volume resuscitated.  If blood pressure stable by tomorrow, can likely restart Coreg. 9. Elevated liver enzymes.  Likely related to dehydration and volume depletion.  Mild improvement with IV fluids today.  Right upper quadrant ultrasound did not show any acute findings.   DVT prophylaxis: Heparin Code Status: Full code Family Communication: No family present Disposition Plan: Discharge home once improved   Consultants:   Cardiology  Procedures:  Echocardiogram:- Left ventricle: The cavity size was normal. There was mild   concentric hypertrophy. Systolic function was normal. The   estimated ejection fraction was in the range of 50% to 55%. There   was septal-lateral dyssynchrony. Doppler parameters are   consistent with abnormal left ventricular relaxation (grade 1   diastolic dysfunction). - Ventricular septum: Septal motion showed abnormal function and   dyssynergy. These changes are consistent with intraventricular   conduction delay. - Aortic valve: Trileaflet; mildly thickened, moderately calcified   leaflets. Morphologically, there appears to be mild calcific   aortic stenosis. - Mitral valve: Mildly calcified annulus. - Right ventricle: The cavity size was mildly dilated. Systolic   function was moderately reduced. - Right atrium: The atrium was mildly dilated. - Tricuspid valve: There was moderate regurgitation. - Pulmonary arteries: PA peak pressure: 57 mm Hg (S). - Inferior vena cava: The vessel was dilated. The respirophasic  diameter changes were blunted (< 50%), consistent with elevated    central venous pressure. Estimated CVP 15 mmHg.  Antimicrobials:   Azithromycin 12/9 >   Subjective: Overall breathing is improving, but not back to baseline.  Has not had  further diarrhea.  Objective: Vitals:   08/25/18 1600 08/25/18 1647 08/25/18 1700 08/25/18 2014  BP: (!) 145/80  (!) 145/83   Pulse: 79  78   Resp: (!) 26  (!) 21   Temp:  98.7 F (37.1 C)    TempSrc:  Oral    SpO2: 93%  92% 98%  Weight:      Height:        Intake/Output Summary (Last 24 hours) at 08/25/2018 2017 Last data filed at 08/25/2018 1512 Gross per 24 hour  Intake 2713.65 ml  Output 750 ml  Net 1963.65 ml   Filed Weights   08/24/18 1044 08/25/18 0040 08/25/18 0500  Weight: 95.7 kg 92.8 kg 92.8 kg    Examination:  General exam: Appears calm and comfortable  Respiratory system: Clear to auscultation. Respiratory effort normal. Cardiovascular system: S1 & S2 heard, RRR. No JVD, murmurs, rubs, gallops or clicks. No pedal edema. Gastrointestinal system: Abdomen is nondistended, soft and nontender. No organomegaly or masses felt. Normal bowel sounds heard. Central nervous system: Alert and oriented. No focal neurological deficits. Extremities: Symmetric 5 x 5 power. Skin: No rashes, lesions or ulcers Psychiatry: Judgement and insight appear normal. Mood & affect appropriate.     Data Reviewed: I have personally reviewed following labs and imaging studies  CBC: Recent Labs  Lab 08/24/18 1137 08/24/18 1149 08/25/18 0407  WBC 5.7  --  5.0  NEUTROABS 4.1  --   --   HGB 12.5* 14.6 11.4*  HCT 41.1 43.0 37.8*  MCV 94.3  --  94.5  PLT 213  --  510   Basic Metabolic Panel: Recent Labs  Lab 08/24/18 1149 08/24/18 1408 08/25/18 0407  NA 136  --  136  K 5.9* 5.5* 5.6*  CL 100  --  103  CO2  --   --  24  GLUCOSE 78  --  176*  BUN 55*  --  59*  CREATININE 4.20*  --  2.37*  CALCIUM  --   --  8.3*   GFR: Estimated Creatinine Clearance: 33.7 mL/min (A) (by C-G formula based on SCr of 2.37 mg/dL (H)). Liver Function Tests: Recent Labs  Lab 08/24/18 1137 08/25/18 0407  AST 484* 281*  ALT 349* 308*  ALKPHOS 141* 116  BILITOT 3.1* 2.3*  PROT 8.7* 7.3    ALBUMIN 4.1 3.4*   Recent Labs  Lab 08/24/18 1137  LIPASE 23   No results for input(s): AMMONIA in the last 168 hours. Coagulation Profile: No results for input(s): INR, PROTIME in the last 168 hours. Cardiac Enzymes: Recent Labs  Lab 08/24/18 1146 08/24/18 1644 08/24/18 2214 08/25/18 0407  CKTOTAL 574*  --   --   --   TROPONINI  --  1.24* 0.81* 0.59*   BNP (last 3 results) No results for input(s): PROBNP in the last 8760 hours. HbA1C: No results for input(s): HGBA1C in the last 72 hours. CBG: Recent Labs  Lab 08/24/18 2200 08/25/18 0048 08/25/18 0813 08/25/18 1147 08/25/18 1651  GLUCAP 190* 146* 184* 232* 225*   Lipid Profile: No results for input(s): CHOL, HDL, LDLCALC, TRIG, CHOLHDL, LDLDIRECT in the last 72 hours. Thyroid Function Tests: No results for input(s): TSH, T4TOTAL, FREET4, T3FREE, THYROIDAB in the  last 72 hours. Anemia Panel: No results for input(s): VITAMINB12, FOLATE, FERRITIN, TIBC, IRON, RETICCTPCT in the last 72 hours. Sepsis Labs: Recent Labs  Lab 08/24/18 1145 08/24/18 1258  LATICACIDVEN 2.42* 2.58*    Recent Results (from the past 240 hour(s))  MRSA PCR Screening     Status: None   Collection Time: 08/25/18 12:21 AM  Result Value Ref Range Status   MRSA by PCR NEGATIVE NEGATIVE Final    Comment:        The GeneXpert MRSA Assay (FDA approved for NASAL specimens only), is one component of a comprehensive MRSA colonization surveillance program. It is not intended to diagnose MRSA infection nor to guide or monitor treatment for MRSA infections. Performed at [facility], [address] [place], [place] [address]          Radiology Studies: Ct Head Wo Contrast  Result Date: 08/24/2018 CLINICAL DATA:  Altered level of consciousness. EXAM: CT HEAD WITHOUT CONTRAST TECHNIQUE: Contiguous axial images were obtained from the base of the skull through the vertex without intravenous contrast. COMPARISON:  None. FINDINGS: Brain:  Mild to moderate atrophy. Hypodensity in the cerebral white matter bilaterally most likely chronic. Negative for acute infarct, hemorrhage, or mass. Vascular: Atherosclerotic calcification without acute arterial thrombosis. Skull: Negative Sinuses/Orbits: Paranasal sinuses clear. Bilateral cataract surgery. Other: None IMPRESSION: Atrophy and chronic microvascular ischemic changes in the white matter. No acute abnormality. Electronically Signed   By: Franchot Gallo M.D.   On: 08/24/2018 12:54   US Abdomen Complete  Result Date: 08/25/2018 CLINICAL DATA:  Elevated liver function studies. Acute renal injury. History of prostate malignancy and diabetes. EXAM: ABDOMEN ULTRASOUND COMPLETE COMPARISON:  None. FINDINGS: Gallbladder: The gallbladder is adequately distended. No stones or sludge are observed. There is borderline thickening of the gallbladder wall at 4 mm but no pericholecystic fluid or positive sonographic Murphy's sign. Common bile duct: Diameter: 3 mm Liver: No focal lesion identified. Within normal limits in parenchymal echogenicity. There is a small amount of perihepatic fluid. Portal vein is patent on color Doppler imaging with normal direction of blood flow towards the liver. IVC: No abnormality visualized. Pancreas: The pancreas was obscured by bowel gas. Spleen: Size and appearance within normal limits. Right Kidney: Length: 12.9 cm. The renal cortical echotexture is increased and is greater than that of the liver. There is a septated cyst in the mid to lower pole cortex measuring 2.1 x 1.9 x 1.8 cm Left Kidney: Length: 13.4 cm. The renal cortical echotexture on the left is mildly increased similar to that on the right. There are 3 cystic structures in the left kidney. The largest contains a septation and lies in the lower pole and measures 3.3 x 3.2 x 3.4 cm. Two additional cysts are present which are simple cystic in nature. There is no hydronephrosis. Abdominal aorta: Bowel gas obscures the mid  and distal aorta and bifurcation. There is a small right pleural effusion. Other findings: None. IMPRESSION: Increased renal cortical echotexture consistent with medical renal disease. No hydronephrosis. Septated cysts in both kidneys. Normal appearance of the liver. Small amount of perihepatic ascites. Small right pleural effusion. Nonvisualization of the pancreas due to bowel gas. Electronically Signed   By: David  Martinique M.D.   On: 08/25/2018 12:15   Dg Knee Complete 4 Views Right  Result Date: 08/24/2018 CLINICAL DATA:  Right knee pain following an unwitnessed fall. EXAM: RIGHT KNEE - COMPLETE 4+ VIEW COMPARISON:  None. FINDINGS: The bones are subjectively adequately mineralized. There's  mild narrowing of the medial joint compartment. There's mild beaking of the tibial spines. There's no acute or healing fracture. A small suprapatellar effusion is suspected. IMPRESSION: There's no acute fracture nor dislocation of the right knee. Mild degenerative narrowing of the medial compartment is present. Electronically Signed   By: David  Martinique M.D.   On: 08/24/2018 12:23   Dg Abd Acute W/chest  Result Date: 08/24/2018 CLINICAL DATA:  One week of shortness of breath. The face was found on the floor this morning and complains of right knee and left lower ribcage pain secondary to a fall. EXAM: DG ABDOMEN ACUTE W/ 1V CHEST COMPARISON:  Chest x-ray of May 13, 2017 FINDINGS: The lungs are well-expanded. The interstitial markings are chronically increased. The heart is mildly enlarged but stable. The pulmonary vascularity is not clearly engorged. The lateral left lower ribs are excluded from the field of view. Within the abdomen the bowel gas pattern is within the limits of normal. The stool burden is moderate. The observed bony structures of the abdomen and pelvis exhibit no acute abnormalities. IMPRESSION: Chronic bronchitic changes, stable. No alveolar pneumonia nor pulmonary edema. No pneumothorax nor large  pleural effusion. The the mid and lower left ribs cannot be completely assessed due to patient positioning. Within the abdomen no acute abnormality is observed. The observed bony structures exhibit no acute abnormalities. Electronically Signed   By: David  Martinique M.D.   On: 08/24/2018 12:21        Scheduled Meds: . aspirin EC  81 mg Oral Daily  . azithromycin  250 mg Oral Daily  . guaiFENesin  1,200 mg Oral BID  . heparin injection (subcutaneous)  5,000 Units Subcutaneous Q8H  . insulin aspart  0-15 Units Subcutaneous TID WC  . insulin aspart  0-5 Units Subcutaneous QHS  . ipratropium-albuterol  3 mL Nebulization Q6H  . methylPREDNISolone (SOLU-MEDROL) injection  60 mg Intravenous Q12H  . mometasone-formoterol  2 puff Inhalation BID  . nicotine  21 mg Transdermal Daily   Continuous Infusions: . sodium chloride 125 mL/hr at 08/25/18 0629     LOS: 1 day    Time spent: 35 minutes    Kathie Dike, MD [facility] Pager [phone]  If 7PM-7AM, please contact night-coverage [email or web address] Password [facility] 08/25/2018, 8:17 PM

## 2018-08-25 NOTE — Consult Note (Addendum)
Cardiology Consult    Patient ID: Clayton [name]; [id number]; Jun 26, 1951   Admit date: 08/24/2018 Date of Consult: 08/25/2018  Primary Care Provider: Dione Housekeeper, MD Primary Cardiologist: Carlyle Dolly, MD   Patient Profile    Clayton [name] is a 67 y.o. male with past medical history of CAD (s/p BMSx3 to RCA in 2009), HTN, HLD, COPD (on 2L [place] at baseline), and tobacco use who is being seen today for the evaluation of elevated cardiac enzymes at the request of Dr. Roderic Palau.   History of Present Illness    [name] was last examined by Dr. Harl [name] in 01/2016 and reported occasional dyspnea at times but denied any recurrent chest pain. He was continued on his current medication regimen and informed to follow-up in 6 months but has not been evaluated by Cardiology since.  He presented to [facility] ED on 08/24/2018 for evaluation of worsening dyspnea and having been found on the floor by family members earlier that morning. In talking with the patient today, he reports overall being in his normal state of health until approximately 1 week ago when he developed significant diarrhea. During this timeframe, he experienced weakness and associated nausea. Reports that his PO intake was overall poor.  Yesterday while walking to the restroom, he developed acute dizziness and weakness which led to a fall. He does not believe he lost consciousness. He denies any recent chest pain, palpitations, orthopnea, PND, or lower extremity edema. He does have chronic dyspnea on exertion and has been prescribed the use of a portable oxygen concentrator in the past by review of his PCP's office notes in [place] but has not been compliant with this due to insurance coverage of the machine.   Initial labs show WBC 5.7, Hgb 12.5, platelets 213, Na+ 136, K+ 5.9, and creatinine 4.20 (previously 0.62 in 04/2017).  AST 484,  ALT 349.  Lactic acid elevated 2.42. CK at 574. BNP 678. Initial and cyclic troponin  values have been elevated to 1.24, 0.81, and 0.59.  EKG shows NSR, HR 74, with RBBB and LPFB. Plain abdominal film showing chronic bronchitic changes with no evidence of pneumonia and no acute abdominal findings. CT Head showing atrophy and chronic microvascular ischemic changes with no acute abnormalities.  He was admitted for acute on chronic respiratory failure and has been started on IV steroids and PO antibiotics. Remains on 10 L HFNC currently. He was also started on IV fluids in the setting of his AKI which was thought to be secondary to dehydration. Repeat labs this AM show his creatinine has improved to 2.37.  AST also improved to 281 and ALT 308.  Past Medical History:  Diagnosis Date  . CAD (coronary artery disease)    a. s/p BMS x 3 to RCA in 2009  . COPD (chronic obstructive pulmonary disease) ([place])   . Diabetes mellitus   . GERD (gastroesophageal reflux disease)   . Guaiac positive stools 07/03/2018  . HTN (hypertension)   . Hyperlipidemia   . Myocardial infarction ([place]) 2009  . Prostate cancer ([place])   . PVD (peripheral vascular disease) ([place])     Past Surgical History:  Procedure Laterality Date  . CARDIAC CATHETERIZATION     with stent  . cardiac stents  2009  . CATARACT EXTRACTION W/PHACO Left 08/19/2013   Procedure: CATARACT EXTRACTION PHACO AND INTRAOCULAR LENS PLACEMENT (IOC);  Surgeon: Clayton Branch, MD;  Location: [facility];  Service: Ophthalmology;  Laterality: Left;  CDE:37.77  . CATARACT  EXTRACTION W/PHACO Right 07/29/2016   Procedure: CATARACT EXTRACTION PHACO AND INTRAOCULAR LENS PLACEMENT RIGHT EYE; CDE:  18.10;  Surgeon: Clayton Branch, MD;  Location: [facility];  Service: Ophthalmology;  Laterality: Right;     Home Medications:  Prior to Admission medications   Medication Sig Start Date End Date Taking? Authorizing Provider  albuterol (PROVENTIL HFA;VENTOLIN HFA) 108 (90 BASE) MCG/ACT inhaler Inhale 2 puffs into the lungs every 6 (six) hours as needed for wheezing or  shortness of breath. Please instruct in proper technique. 06/29/15  Yes Clayton Cota, MD  amLODipine (NORVASC) 5 MG tablet Take 5 mg by mouth daily.   Yes [provider]  carvedilol (COREG) 6.25 MG tablet Take 1 tablet by mouth 2 (two) times daily. 01/06/16  Yes [provider]  Fluticasone-Salmeterol (ADVAIR DISKUS) 250-50 MCG/DOSE AEPB Inhale 1 puff into the lungs 2 (two) times daily. 05/17/17 08/24/18 Yes Clayton Falconer, MD  glipiZIDE (GLUCOTROL) 10 MG tablet Take 10 mg by mouth 2 (two) times daily.   Yes [provider]  metFORMIN (GLUCOPHAGE) 1000 MG tablet Take 1 tablet by mouth 2 (two) times daily. 05/14/15  Yes [provider]  ondansetron (ZOFRAN) 4 MG tablet Take 4 mg by mouth every 8 (eight) hours as needed for nausea or vomiting.   Yes [provider]  sildenafil (REVATIO) 20 MG tablet Take 2-5 tablets by mouth daily as needed (intercourse).  01/08/16   [provider]    Inpatient Medications: Scheduled Meds: . aspirin EC  81 mg Oral Daily  . azithromycin  250 mg Oral Daily  . guaiFENesin  1,200 mg Oral BID  . insulin aspart  0-15 Units Subcutaneous TID WC  . insulin aspart  0-5 Units Subcutaneous QHS  . ipratropium-albuterol  3 mL Nebulization Q6H  . methylPREDNISolone (SOLU-MEDROL) injection  60 mg Intravenous Q12H  . mometasone-formoterol  2 puff Inhalation BID  . nicotine  21 mg Transdermal Daily  . sodium zirconium cyclosilicate  10 g Oral Once   Continuous Infusions: . sodium chloride 125 mL/hr at 08/25/18 8657  . heparin 1,500 Units/hr (08/25/18 0823)   PRN Meds: acetaminophen **OR** acetaminophen, albuterol, ondansetron **OR** ondansetron (ZOFRAN) IV  Allergies:   No Known Allergies  Social History:   Social History   Socioeconomic History  . Marital status: Single    Spouse name: Not on file  . Number of children: Not on file  . Years of education: Not on file  . Highest education level: Not on file    Occupational History  . Occupation: Associate Professor  Social Needs  . Financial resource strain: Not on file  . Food insecurity:    Worry: Not on file    Inability: Not on file  . Transportation needs:    Medical: Not on file    Non-medical: Not on file  Tobacco Use  . Smoking status: Current Every Day Smoker    Packs/day: 1.00    Years: 42.00    Pack years: 42.00    Types: Cigarettes    Start date: 07/07/1969  . Smokeless tobacco: Never Used  Substance and Sexual Activity  . Alcohol use: Yes    Alcohol/week: 0.0 standard drinks    Comment: rarely  . Drug use: No  . Sexual activity: Yes  Lifestyle  . Physical activity:    Days per week: Not on file    Minutes per session: Not on file  . Stress: Not on file  Relationships  . Social connections:  Talks on phone: Not on file    Gets together: Not on file    Attends religious service: Not on file    Active member of club or organization: Not on file    Attends meetings of clubs or organizations: Not on file    Relationship status: Not on file  . Intimate partner violence:    Fear of current or ex partner: Not on file    Emotionally abused: Not on file    Physically abused: Not on file    Forced sexual activity: Not on file  Other Topics Concern  . Not on file  Social History Narrative  . Not on file     Family History:    Family History  Problem Relation Age of Onset  . Coronary artery disease Mother   . Coronary artery disease Father   . Prostate cancer Father       Review of Systems    General:  No chills, fever, night sweats or weight changes.  Cardiovascular:  No chest pain, edema, orthopnea, palpitations, paroxysmal nocturnal dyspnea. Positive for dyspnea on exertion.  Dermatological: No rash, lesions/masses Respiratory: No cough, Positive for dyspnea.  Urologic: No hematuria, dysuria Abdominal:   No nausea, vomiting, bright red blood per rectum, melena, or hematemesis. Positive for diarrhea and  decreased PO intake.  Neurologic:  No visual changes, wkns, changes in mental status.  All other systems reviewed and are otherwise negative except as noted above.  Physical Exam/Data    Vitals:   08/25/18 0500 08/25/18 0600 08/25/18 0700 08/25/18 0811  BP: (!) 142/68 (!) 118/59 126/65   Pulse:  71 72   Resp: 17 13 19    Temp:    98.4 F (36.9 C)  TempSrc:    Oral  SpO2: 99% 95% 93%   Weight: 92.8 kg     Height:        Intake/Output Summary (Last 24 hours) at 08/25/2018 0847 Last data filed at 08/25/2018 0700 Gross per 24 hour  Intake 2181.04 ml  Output 750 ml  Net 1431.04 ml   Filed Weights   08/24/18 1044 08/25/18 0040 08/25/18 0500  Weight: 95.7 kg 92.8 kg 92.8 kg   Body mass index is 27.75 kg/m.   General: Pleasant, African American male appearing in NAD. Psych: Normal affect. Neuro: Alert and oriented X 3. Moves all extremities spontaneously. HEENT: Normal  Neck: Supple without bruits or JVD. Lungs:  Resp regular and unlabored, decreased breath sounds along bases bilaterally. No definitive wheezing or rales. On HFNC.  Heart: RRR no s3, s4, or murmurs. Abdomen: Soft, non-tender, non-distended, BS + x 4.  Extremities: No clubbing, cyanosis or lower extremity edema. DP/PT/Radials 2+ and equal bilaterally.   EKG:  The EKG was personally reviewed and demonstrates:  NSR, HR 74, with RBBB and LPFB.    Labs/Studies     Relevant CV Studies:  Echocardiogram: 05/2015 Study Conclusions  - Left ventricle: Technically dificiult study. 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 The cavity size was mildly dilated.   Wall thickness was increased in a pattern of mild LVH. The   estimated ejection fraction was 55%. - Aortic valve: Sclerosis without stenosis. There was mild   regurgitation. - Right ventricle: The cavity size was normal. Systolic function   was normal.   Laboratory Data:  Chemistry Recent Labs  Lab 08/24/18 1149 08/24/18 1408 08/25/18 0407   NA 136  --  136  K 5.9* 5.5* 5.6*  CL 100  --  103  CO2  --   --  24  GLUCOSE 78  --  176*  BUN 55*  --  59*  CREATININE 4.20*  --  2.37*  CALCIUM  --   --  8.3*  GFRNONAA  --   --  28*  GFRAA  --   --  32*  ANIONGAP  --   --  9    Recent Labs  Lab 08/24/18 1137 08/25/18 0407  PROT 8.7* 7.3  ALBUMIN 4.1 3.4*  AST 484* 281*  ALT 349* 308*  ALKPHOS 141* 116  BILITOT 3.1* 2.3*   Hematology Recent Labs  Lab 08/24/18 1137 08/24/18 1149 08/25/18 0407  WBC 5.7  --  5.0  RBC 4.36  --  4.00*  HGB 12.5* 14.6 11.4*  HCT 41.1 43.0 37.8*  MCV 94.3  --  94.5  MCH 28.7  --  28.5  MCHC 30.4  --  30.2  RDW 14.1  --  14.2  PLT 213  --  178   Cardiac Enzymes Recent Labs  Lab 08/24/18 1644 08/24/18 2214 08/25/18 0407  TROPONINI 1.24* 0.81* 0.59*    Recent Labs  Lab 08/24/18 1143  TROPIPOC 1.05*    BNP Recent Labs  Lab 08/24/18 1409  BNP 678.0*    DDimer No results for input(s): DDIMER in the last 168 hours.  Radiology/Studies:  Ct Head Wo Contrast  Result Date: 08/24/2018 CLINICAL DATA:  Altered level of consciousness. EXAM: CT HEAD WITHOUT CONTRAST TECHNIQUE: Contiguous axial images were obtained from the base of the skull through the vertex without intravenous contrast. COMPARISON:  None. FINDINGS: Brain: Mild to moderate atrophy. Hypodensity in the cerebral white matter bilaterally most likely chronic. Negative for acute infarct, hemorrhage, or mass. Vascular: Atherosclerotic calcification without acute arterial thrombosis. Skull: Negative Sinuses/Orbits: Paranasal sinuses clear. Bilateral cataract surgery. Other: None IMPRESSION: Atrophy and chronic microvascular ischemic changes in the white matter. No acute abnormality. Electronically Signed   By: Franchot Gallo M.D.   On: 08/24/2018 12:54   Dg Knee Complete 4 Views Right  Result Date: 08/24/2018 CLINICAL DATA:  Right knee pain following an unwitnessed fall. EXAM: RIGHT KNEE - COMPLETE 4+ VIEW COMPARISON:  None.  FINDINGS: The bones are subjectively adequately mineralized. There's mild narrowing of the medial joint compartment. There's mild beaking of the tibial spines. There's no acute or healing fracture. A small suprapatellar effusion is suspected. IMPRESSION: There's no acute fracture nor dislocation of the right knee. Mild degenerative narrowing of the medial compartment is present. Electronically Signed   By: David  Martinique M.D.   On: 08/24/2018 12:23   Dg Abd Acute W/chest  Result Date: 08/24/2018 CLINICAL DATA:  One week of shortness of breath. The face was found on the floor this morning and complains of right knee and left lower ribcage pain secondary to a fall. EXAM: DG ABDOMEN ACUTE W/ 1V CHEST COMPARISON:  Chest x-ray of May 13, 2017 FINDINGS: The lungs are well-expanded. The interstitial markings are chronically increased. The heart is mildly enlarged but stable. The pulmonary vascularity is not clearly engorged. The lateral left lower ribs are excluded from the field of view. Within the abdomen the bowel gas pattern is within the limits of normal. The stool burden is moderate. The observed bony structures of the abdomen and pelvis exhibit no acute abnormalities. IMPRESSION: Chronic bronchitic changes, stable. No alveolar pneumonia nor pulmonary edema. No pneumothorax nor large pleural effusion. The the mid and lower left ribs cannot be completely assessed due  to patient positioning. Within the abdomen no acute abnormality is observed. The observed bony structures exhibit no acute abnormalities. Electronically Signed   By: David  Martinique M.D.   On: 08/24/2018 12:21     Assessment & Plan    1. Elevated Troponin Values - presented for evaluation of dyspnea, diarrhea, dizziness, and progressive weakness after having been found on the floor by family members. He is unaware of any LOC but this is mentioned in the ED Provider notes. He has baseline dyspnea on exertion in the setting of COPD but denies any  acute changes in his respiratory status. No recent chest pain or palpitations.  - found to have an AKI, elevated LFT's, and COPD exacerbation upon admission but troponin values also elevated to 1.24, 0.81, and 0.59. EKG shows NSR, HR 74, with RBBB and LPFB. Enzyme elevation likely secondary to demand ischemia in the setting of his multiple medical issues. Thankfully he denies any recent chest pain and given his AKI, he would not be an ideal catheterization candidate at this time. Agree with continuing Heparin and obtaining an echocardiogram to assess LV function and wall motion. If no significant abnormalities, could consider an outpatient stress test once his other medical issues improve. Continue to follow on telemetry but no significant arrhythmias documented thus far. Continue ASA and plan to restart BB therapy once BP normalizes. No statin therapy at this time given his elevated LFT's.   2. CAD - s/p BMSx3 to RCA in 2009. He reports having baseline dyspnea on exertion but denies any recent chest pain.  Echocardiogram is pending to assess for any structural abnormalities. - He is listed as being on Amlodipine 5 mg daily and Coreg 6.25 mg twice daily. Does not appear he was taking ASA or statin therapy prior to admission (listed as being on ASA 81mg  daily and Pravastatin 40mg  daily at his last Cardiology visit in 01/2016). Agree with restarting ASA. BB held given initial hypotension and dehydration. Would not restart statin therapy at this time due to elevated LFT's.   3. HTN - BP has been variable at 91/55 - 154/134 since admission. Stable at 126/65 on most recent check. Would plan to restart BB therapy once BP stabilizes.   4. HLD - By review of Care Everywhere, FLP in 06/2018 showed total cholesterol of 126, HDL 44, Triglycerides 82, and LDL 66. Listed as being on Pravastatin 40mg  daily in 06/2018 but not on his PTA medication list. Would continue to hold for now given elevated LFT's.   5. COPD/  Chronic Hypoxic Respiratory Failure - has known COPD which is followed by Dr. Luan Pulling and was awaiting approval through his insurance for a portable concentrator as saturations dropped to 73% with ambulation when checked by his PCP in 06/2018. Currently being treated for COPD exacerbation and receiving IV Steroids and Azithromycin. Remains on 10L HFNC.   - per admitting team.   6. AKI - creatinine elevated to 4.20 at the time of admission, improved to 2.37 this AM but not back to baseline. Remains on IVF at 125 mL/hr.   7. Elevated LFT's - AST and ALT elevated to 484 and 349 respectively at the time of admission, improved to 281 and 308 this morning. Thought to be due to volume depletion. Abdominal US pending.    For questions or updates, please contact [place] Please consult [email or web address] for contact info under Cardiology/STEMI.  Arna Medici, PA-C 08/25/2018, 8:47 AM Pager: [phone]  The patient was seen and examined,  and I agree with the history, physical exam, assessment and plan as documented above, with modifications as noted below. I have also personally reviewed all relevant documentation, old records, labs, and both radiographic and cardiovascular studies. I have also independently interpreted old and new ECG's.  This is a very pleasant 67 year old male with a history of coronary artery disease with 3 bare-metal stents to the RCA in 2009, hypertension, hyperlipidemia, and COPD whom we are asked to evaluate for troponin elevation.  Details of his history are noted above with respect to diarrhea, progressive shortness of breath, and weakness. He denies syncope.  He also denies any recent chest pain, leg swelling, orthopnea, palpitations, and paroxysmal nocturnal dyspnea.  Troponins peaked at 1.24 with most recent value of 0.59.  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  Creatinine was elevated to 4.2 on  admission and has been normal in August 2018 and after IV fluids, creatinine has improved to 2.37.  Liver transaminases were elevated on admission and are also coming down.  I reviewed the chest x-ray which shows chronic bronchitic changes without pneumonia or pulmonary edema. Head CT showed atrophy with chronic microvascular ischemic changes in the white matter with no acute abnormalities noted.  He was initially started on IV heparin preemptively.  He is doing well this morning and feels better.  We talked about his days working at [employer] and [employer].  He retired about a year ago.  I will await echocardiogram for assessment of cardiac structure and function.  An outpatient stress test could be considered.  We will continue aspirin.  Blood pressures have fluctuated but he has been hypertensive on the last 2 readings.  Beta-blockers can be initiated.  No statin therapy at this point due to elevated transaminases although they are improving.  When I spoke with him he said he was taking aspirin on most days along with pravastatin. Troponin elevation is likely secondary to demand ischemia in the context of numerous medical issues as detailed above.  This does not appear to represent an acute coronary syndrome.   Kate Sable, MD, [facility]  08/25/2018 10:15 AM

## 2018-08-25 NOTE — Progress Notes (Signed)
ANTICOAGULATION CONSULT NOTE  Pharmacy Consult for Heparin Indication: chest pain/ACS  No Known Allergies  Patient Measurements: Height: 6' (182.9 cm) Weight: 204 lb 9.4 oz (92.8 kg) IBW/kg (Calculated) : 77.6 HEPARIN DW (KG): 92.8  Vital Signs: Temp: 98.4 F (36.9 C) (12/10 0811) Temp Source: Oral (12/10 0811) BP: 126/65 (12/10 0700) Pulse Rate: 72 (12/10 0700)  Labs: Recent Labs    08/24/18 1137 08/24/18 1146 08/24/18 1149 08/24/18 1644 08/24/18 2214 08/25/18 0407 08/25/18 0839  HGB 12.5*  --  14.6  --   --  11.4*  --   HCT 41.1  --  43.0  --   --  37.8*  --   PLT 213  --   --   --   --  178  --   APTT  --   --   --  169*  --   --   --   HEPARINUNFRC  --   --   --   --  <0.10*  --  0.12*  CREATININE  --   --  4.20*  --   --  2.37*  --   CKTOTAL  --  574*  --   --   --   --   --   TROPONINI  --   --   --  1.24* 0.81* 0.59*  --     Estimated Creatinine Clearance: 33.7 mL/min (A) (by C-G formula based on SCr of 2.37 mg/dL (H)).  Assessment: 67 y.o. male with presented to ED with complaints of loss of consciousness. Patient with know cardiac history and elevated cardiac markers, possible ACS, for heparin. Heparin level is subtherapeutic.  Goal of Therapy:  Heparin level 0.3-0.7 units/ml Monitor platelets by anticoagulation protocol: Yes   Plan: Heparin 3000 units IV bolus, then increase heparin 1800 units/hr Check anti-Xa level in 6-8 hours and daily while on heparin Continue to monitor H&H and platelets  Isac Sarna, BS [place], BCPS Clinical Pharmacist Pager [phone] 08/25/2018 [phone]

## 2018-08-25 NOTE — Progress Notes (Signed)
[place] for Heparin Indication: chest pain/ACS  No Known Allergies  Patient Measurements: Height: 6' (182.9 cm) Weight: 211 lb (95.7 kg) IBW/kg (Calculated) : 77.6 HEPARIN DW (KG): 95.7  Vital Signs: BP: 116/60 (12/09 2300) Pulse Rate: 80 (12/09 2300)  Labs: Recent Labs    08/24/18 1137 08/24/18 1146 08/24/18 1149 08/24/18 1644 08/24/18 2214  HGB 12.5*  --  14.6  --   --   HCT 41.1  --  43.0  --   --   PLT 213  --   --   --   --   APTT  --   --   --  169*  --   HEPARINUNFRC  --   --   --   --  <0.10*  CREATININE  --   --  4.20*  --   --   CKTOTAL  --  574*  --   --   --   TROPONINI  --   --   --  1.24* 0.81*    Estimated Creatinine Clearance: 20.8 mL/min (A) (by C-G formula based on SCr of 4.2 mg/dL (H)).  Assessment: 67 y.o. male with elevated cardiac markers, possible ACS, for heparin  Goal of Therapy:  Heparin level 0.3-0.7 units/ml Monitor platelets by anticoagulation protocol: Yes   Plan: Heparin 2000 units IV bolus, then increase heparin 1500 units/hr Follow-up am labs.   Phillis Knack, PharmD, BCPS

## 2018-08-25 NOTE — Progress Notes (Signed)
*  PRELIMINARY RESULTS* Echocardiogram 2D Echocardiogram has been performed.  Clayton [name] 08/25/2018, 12:58 PM

## 2018-08-25 NOTE — Progress Notes (Signed)
LV systolic function is normal by echocardiogram, LVEF 50-55%. There was mild aortic stenosis and moderate tricuspid regurgitation.  Would recommend stopping IV heparin. If BP remains stable tomorrow, Lopressor 25 mg bid can be initiated.  Plan communicated with Dr. Roderic Palau.  [facility] will sign off.   Medication Recommendations:  As above Other recommendations (labs, testing, etc):  Consider outpatient stress test (will defer to patient's primary cardiologist). Follow up as an outpatient:  With Dr. Harl Bowie

## 2018-08-26 LAB — CBC
HEMATOCRIT: 39.1 % (ref 39.0–52.0)
Hemoglobin: 12 g/dL — ABNORMAL LOW (ref 13.0–17.0)
MCH: 29.4 pg (ref 26.0–34.0)
MCHC: 30.7 g/dL (ref 30.0–36.0)
MCV: 95.8 fL (ref 80.0–100.0)
Platelets: 213 10*3/uL (ref 150–400)
RBC: 4.08 MIL/uL — AB (ref 4.22–5.81)
RDW: 14 % (ref 11.5–15.5)
WBC: 7.6 10*3/uL (ref 4.0–10.5)
nRBC: 0 % (ref 0.0–0.2)

## 2018-08-26 LAB — COMPREHENSIVE METABOLIC PANEL
ALK PHOS: 116 U/L (ref 38–126)
ALT: 351 U/L — ABNORMAL HIGH (ref 0–44)
AST: 208 U/L — ABNORMAL HIGH (ref 15–41)
Albumin: 3.5 g/dL (ref 3.5–5.0)
Anion gap: 7 (ref 5–15)
BUN: 45 mg/dL — ABNORMAL HIGH (ref 8–23)
CO2: 24 mmol/L (ref 22–32)
CREATININE: 1.06 mg/dL (ref 0.61–1.24)
Calcium: 8.9 mg/dL (ref 8.9–10.3)
Chloride: 105 mmol/L (ref 98–111)
GFR calc Af Amer: >60 mL/min (ref >60)
GLUCOSE: 202 mg/dL — AB (ref 70–99)
POTASSIUM: 5.4 mmol/L — AB (ref 3.5–5.1)
Sodium: 136 mmol/L (ref 135–145)
TOTAL PROTEIN: 7.7 g/dL (ref 6.5–8.1)
Total Bilirubin: 2.1 mg/dL — ABNORMAL HIGH (ref 0.3–1.2)

## 2018-08-26 LAB — GLUCOSE, CAPILLARY
Glucose-Capillary: 189 mg/dL — ABNORMAL HIGH (ref 70–99)
Glucose-Capillary: 238 mg/dL — ABNORMAL HIGH (ref 70–99)
Glucose-Capillary: 208 mg/dL — ABNORMAL HIGH (ref 70–99)
Glucose-Capillary: 177 mg/dL — ABNORMAL HIGH (ref 70–99)

## 2018-08-26 MED ORDER — IPRATROPIUM-ALBUTEROL 0.5-2.5 (3) MG/3ML IN SOLN
3.0000 mL | Freq: Three times a day (TID) | RESPIRATORY_TRACT | Status: DC
  Administered 2018-08-26 – 2018-08-27 (×4): 3 mL via RESPIRATORY_TRACT
  Filled 2018-08-26 (×5): qty 3

## 2018-08-26 MED ORDER — CARVEDILOL 3.125 MG PO TABS
6.2500 mg | ORAL_TABLET | Freq: Two times a day (BID) | ORAL | Status: DC
  Administered 2018-08-26 – 2018-08-28 (×4): 6.25 mg via ORAL
  Filled 2018-08-26 (×4): qty 2

## 2018-08-26 MED ORDER — METHYLPREDNISOLONE SODIUM SUCC 40 MG IJ SOLR
40.0000 mg | Freq: Two times a day (BID) | INTRAMUSCULAR | Status: DC
  Administered 2018-08-26 – 2018-08-27 (×2): 40 mg via INTRAVENOUS
  Filled 2018-08-26 (×2): qty 1

## 2018-08-26 NOTE — Progress Notes (Signed)
Inpatient Diabetes Program Recommendations  AACE/ADA: New Consensus Statement on Inpatient Glycemic Control (2015)  Target Ranges:  Prepandial:   less than 140 mg/dL      Peak postprandial:   less than 180 mg/dL (1-2 hours)      Critically ill patients:  140 - 180 mg/dL   Lab Results  Component Value Date   GLUCAP 238 (H) 08/26/2018   HGBA1C 6.4 (H) 05/13/2017   Results for JOAQUIM, TOLEN (MRN [id number]) as of 08/26/2018 15:30  Ref. Range 08/24/2018 11:49 08/25/2018 04:07 08/26/2018 04:09  Glucose Latest Ref Range: 70 - 99 mg/dL 78 176 (H) 202 (H)   Might consider adding Lantus 15 units daily while on solumedrol.  Cos Cob, CDE. M.Ed. Pager [phone] Inpatient Diabetes Coordinator

## 2018-08-26 NOTE — Progress Notes (Signed)
Pt asleep no distress noted.

## 2018-08-26 NOTE — Progress Notes (Addendum)
PROGRESS NOTE    Clayton [name]  DJS:[id number] DOB: Jun 17, 1951 DOA: 08/24/2018 PCP: Dione Housekeeper, MD    Brief Narrative:  67 year old male with a history of COPD on home oxygen, presented to the hospital with shortness of breath.  He has been having vomiting and diarrhea for several days prior to admission with decreased p.o. intake.  He has been found on the floor of his bathroom by his family.  He likely passed out due to volume depletion. He was noted to be in acute renal failure, was dehydrated and had COPD exacerbation.   Assessment & Plan:   Active Problems:   TOBACCO ABUSE   CAD S/P percutaneous coronary angioplasty   COPD exacerbation (HCC)   DM type 2 (diabetes mellitus, type 2) (HCC)   Acute on chronic respiratory failure with hypoxia (HCC)   Elevated troponin   Elevated LFTs   AKI (acute kidney injury) ([place])   Acute gastroenteritis   HTN (hypertension)   Dehydration   1. Acute on chronic respiratory failure.  Likely related to COPD exacerbation.  Initially requiring 10 L of oxygen, is currently on 7 L, will wean down further to 5 L today.  Continue to wean as tolerated.  Will request physical therapy evaluation 2. COPD exacerbation.  Currently on intravenous steroids, bronchodilators and antibiotics.  Continue to wean steroids as tolerated. 3. Acute kidney injury.  Related to GI losses.  On IV hydration with improvement of creatinine.  No hydronephrosis noted on ultrasound. 4. Acute gastroenteritis.  Patient reported significant diarrhea prior to admission.  C. difficile found to be negative.  GI pathogen panel in process.  Overall symptoms have improved. 5. Demand ischemia.  Patient had elevated troponin without any complaints of chest pain or EKG changes.  Echocardiogram did not show any wall motion changes.  Initially started on intravenous heparin.  After seen by cardiology, heparin has been discontinued.  Recommendations are for supportive treatment. 6. Diabetes.   Metformin and glipizide currently on hold.  Continue on sliding scale insulin.  Blood sugars have been stable 7. Lactic acidosis.  Related to dehydration and metformin use.  Continue hydration. 8. Hypertension.  Blood pressures been stable.  Restart on home dose of Coreg. 9. Elevated liver enzymes.  Likely related to dehydration and volume depletion.  Mild improvement with IV fluids. Right upper quadrant ultrasound did not show any acute findings.  We will also check acute hepatitis panel.   DVT prophylaxis: Heparin Code Status: Full code Family Communication: No family present Disposition Plan: Discharge home once improved   Consultants:   Cardiology  Procedures:  Echocardiogram:- Left ventricle: The cavity size was normal. There was mild   concentric hypertrophy. Systolic function was normal. The   estimated ejection fraction was in the range of 50% to 55%. There   was septal-lateral dyssynchrony. Doppler parameters are   consistent with abnormal left ventricular relaxation (grade 1   diastolic dysfunction). - Ventricular septum: Septal motion showed abnormal function and   dyssynergy. These changes are consistent with intraventricular   conduction delay. - Aortic valve: Trileaflet; mildly thickened, moderately calcified   leaflets. Morphologically, there appears to be mild calcific   aortic stenosis. - Mitral valve: Mildly calcified annulus. - Right ventricle: The cavity size was mildly dilated. Systolic   function was moderately reduced. - Right atrium: The atrium was mildly dilated. - Tricuspid valve: There was moderate regurgitation. - Pulmonary arteries: PA peak pressure: 57 mm Hg (S). - Inferior vena cava: The vessel was dilated.  The respirophasic   diameter changes were blunted (< 50%), consistent with elevated    central venous pressure. Estimated CVP 15 mmHg.  Antimicrobials:   Azithromycin 12/9 >   Subjective: Feeling better.  Feels breathing is improving,  but not quite back to baseline.  No further diarrhea.  Objective: Vitals:   08/26/18 0800 08/26/18 0902 08/26/18 1400 08/26/18 1434  BP: (!) 139/111  (!) 161/98   Pulse: 76  81   Resp: (!) 29  19   Temp:      TempSrc:      SpO2: 93% 93% (!) 89% 92%  Weight:      Height:        Intake/Output Summary (Last 24 hours) at 08/26/2018 1542 Last data filed at 08/26/2018 0600 Gross per 24 hour  Intake 1819.62 ml  Output -  Net 1819.62 ml   Filed Weights   08/25/18 0040 08/25/18 0500 08/26/18 0500  Weight: 92.8 kg 92.8 kg 94.3 kg    Examination:  General exam: Alert, awake, oriented x 3 Respiratory system: Diminished breath sounds bilaterally. Respiratory effort normal. Cardiovascular system:RRR. No murmurs, rubs, gallops. Gastrointestinal system: Abdomen is nondistended, soft and nontender. No organomegaly or masses felt. Normal bowel sounds heard. Central nervous system: Alert and oriented. No focal neurological deficits. Extremities: 1+ edema bilaterally Skin: No rashes, lesions or ulcers Psychiatry: Judgement and insight appear normal. Mood & affect appropriate.      Data Reviewed: I have personally reviewed following labs and imaging studies  CBC: Recent Labs  Lab 08/24/18 1137 08/24/18 1149 08/25/18 0407 08/26/18 0409  WBC 5.7  --  5.0 7.6  NEUTROABS 4.1  --   --   --   HGB 12.5* 14.6 11.4* 12.0*  HCT 41.1 43.0 37.8* 39.1  MCV 94.3  --  94.5 95.8  PLT 213  --  178 892   Basic Metabolic Panel: Recent Labs  Lab 08/24/18 1149 08/24/18 1408 08/25/18 0407 08/26/18 0409  NA 136  --  136 136  K 5.9* 5.5* 5.6* 5.4*  CL 100  --  103 105  CO2  --   --  24 24  GLUCOSE 78  --  176* 202*  BUN 55*  --  59* 45*  CREATININE 4.20*  --  2.37* 1.06  CALCIUM  --   --  8.3* 8.9   GFR: Estimated Creatinine Clearance: 81.7 mL/min (by C-G formula based on SCr of 1.06 mg/dL). Liver Function Tests: Recent Labs  Lab 08/24/18 1137 08/25/18 0407 08/26/18 0409  AST 484*  281* 208*  ALT 349* 308* 351*  ALKPHOS 141* 116 116  BILITOT 3.1* 2.3* 2.1*  PROT 8.7* 7.3 7.7  ALBUMIN 4.1 3.4* 3.5   Recent Labs  Lab 08/24/18 1137  LIPASE 23   No results for input(s): AMMONIA in the last 168 hours. Coagulation Profile: No results for input(s): INR, PROTIME in the last 168 hours. Cardiac Enzymes: Recent Labs  Lab 08/24/18 1146 08/24/18 1644 08/24/18 2214 08/25/18 0407  CKTOTAL 574*  --   --   --   TROPONINI  --  1.24* 0.81* 0.59*   BNP (last 3 results) No results for input(s): PROBNP in the last 8760 hours. HbA1C: No results for input(s): HGBA1C in the last 72 hours. CBG: Recent Labs  Lab 08/25/18 1147 08/25/18 1651 08/25/18 2129 08/26/18 0738 08/26/18 1120  GLUCAP 232* 225* 257* 189* 238*   Lipid Profile: No results for input(s): CHOL, HDL, LDLCALC, TRIG, CHOLHDL, LDLDIRECT in the last  72 hours. Thyroid Function Tests: No results for input(s): TSH, T4TOTAL, FREET4, T3FREE, THYROIDAB in the last 72 hours. Anemia Panel: No results for input(s): VITAMINB12, FOLATE, FERRITIN, TIBC, IRON, RETICCTPCT in the last 72 hours. Sepsis Labs: Recent Labs  Lab 08/24/18 1145 08/24/18 1258  LATICACIDVEN 2.42* 2.58*    Recent Results (from the past 240 hour(s))  MRSA PCR Screening     Status: None   Collection Time: 08/25/18 12:21 AM  Result Value Ref Range Status   MRSA by PCR NEGATIVE NEGATIVE Final    Comment:        The GeneXpert MRSA Assay (FDA approved for NASAL specimens only), is one component of a comprehensive MRSA colonization surveillance program. It is not intended to diagnose MRSA infection nor to guide or monitor treatment for MRSA infections. Performed at [facility], [address], [place], [place] [address]   C difficile quick scan w PCR reflex     Status: None   Collection Time: 08/25/18  5:13 PM  Result Value Ref Range Status   C Diff antigen NEGATIVE NEGATIVE Final   C Diff toxin NEGATIVE NEGATIVE Final   C Diff  interpretation No C. difficile detected.  Final    Comment: Performed at [facility], [address], [place], [place] [address]         Radiology Studies: US Abdomen Complete  Result Date: 08/25/2018 CLINICAL DATA:  Elevated liver function studies. Acute renal injury. History of prostate malignancy and diabetes. EXAM: ABDOMEN ULTRASOUND COMPLETE COMPARISON:  None. FINDINGS: Gallbladder: The gallbladder is adequately distended. No stones or sludge are observed. There is borderline thickening of the gallbladder wall at 4 mm but no pericholecystic fluid or positive sonographic Murphy's sign. Common bile duct: Diameter: 3 mm Liver: No focal lesion identified. Within normal limits in parenchymal echogenicity. There is a small amount of perihepatic fluid. Portal vein is patent on color Doppler imaging with normal direction of blood flow towards the liver. IVC: No abnormality visualized. Pancreas: The pancreas was obscured by bowel gas. Spleen: Size and appearance within normal limits. Right Kidney: Length: 12.9 cm. The renal cortical echotexture is increased and is greater than that of the liver. There is a septated cyst in the mid to lower pole cortex measuring 2.1 x 1.9 x 1.8 cm Left Kidney: Length: 13.4 cm. The renal cortical echotexture on the left is mildly increased similar to that on the right. There are 3 cystic structures in the left kidney. The largest contains a septation and lies in the lower pole and measures 3.3 x 3.2 x 3.4 cm. Two additional cysts are present which are simple cystic in nature. There is no hydronephrosis. Abdominal aorta: Bowel gas obscures the mid and distal aorta and bifurcation. There is a small right pleural effusion. Other findings: None. IMPRESSION: Increased renal cortical echotexture consistent with medical renal disease. No hydronephrosis. Septated cysts in both kidneys. Normal appearance of the liver. Small amount of perihepatic ascites. Small right pleural effusion.  Nonvisualization of the pancreas due to bowel gas. Electronically Signed   By: David  Martinique M.D.   On: 08/25/2018 12:15        Scheduled Meds: . aspirin EC  81 mg Oral Daily  . azithromycin  250 mg Oral Daily  . guaiFENesin  1,200 mg Oral BID  . heparin injection (subcutaneous)  5,000 Units Subcutaneous Q8H  . insulin aspart  0-15 Units Subcutaneous TID WC  . insulin aspart  0-5 Units Subcutaneous QHS  . ipratropium-albuterol  3 mL Nebulization TID  . methylPREDNISolone (SOLU-MEDROL) injection  60 mg Intravenous Q12H  . mometasone-formoterol  2 puff Inhalation BID  . nicotine  21 mg Transdermal Daily   Continuous Infusions: . sodium chloride 125 mL/hr at 08/26/18 0600     LOS: 2 days    Time spent: 35 minutes    Kathie Dike, MD [facility] Pager [phone]  If 7PM-7AM, please contact night-coverage [email or web address] Password [facility] 08/26/2018, 3:42 PM

## 2018-08-27 ENCOUNTER — Inpatient Hospital Stay (HOSPITAL_COMMUNITY): Payer: Medicare Other

## 2018-08-27 DIAGNOSIS — E119 Type 2 diabetes mellitus without complications: Secondary | ICD-10-CM

## 2018-08-27 DIAGNOSIS — I639 Cerebral infarction, unspecified: Secondary | ICD-10-CM

## 2018-08-27 DIAGNOSIS — R0603 Acute respiratory distress: Secondary | ICD-10-CM

## 2018-08-27 LAB — CBC
HCT: 40.8 % (ref 39.0–52.0)
Hemoglobin: 12.2 g/dL — ABNORMAL LOW (ref 13.0–17.0)
MCH: 29.4 pg (ref 26.0–34.0)
MCHC: 29.9 g/dL — ABNORMAL LOW (ref 30.0–36.0)
MCV: 98.3 fL (ref 80.0–100.0)
Platelets: 193 10*3/uL (ref 150–400)
RBC: 4.15 MIL/uL — ABNORMAL LOW (ref 4.22–5.81)
RDW: 14.2 % (ref 11.5–15.5)
WBC: 8.2 10*3/uL (ref 4.0–10.5)
nRBC: 0 % (ref 0.0–0.2)

## 2018-08-27 LAB — GASTROINTESTINAL PANEL BY PCR, STOOL (REPLACES STOOL CULTURE)

## 2018-08-27 LAB — BASIC METABOLIC PANEL
Anion gap: 6 (ref 5–15)
BUN: 38 mg/dL — ABNORMAL HIGH (ref 8–23)
CO2: 24 mmol/L (ref 22–32)
Calcium: 8.9 mg/dL (ref 8.9–10.3)
Chloride: 103 mmol/L (ref 98–111)
Creatinine, Ser: 0.89 mg/dL (ref 0.61–1.24)
GFR calc Af Amer: >60 mL/min (ref >60)
GFR calc non Af Amer: >60 mL/min (ref >60)
Glucose, Bld: 217 mg/dL — ABNORMAL HIGH (ref 70–99)
Potassium: 5.7 mmol/L — ABNORMAL HIGH (ref 3.5–5.1)
Sodium: 133 mmol/L — ABNORMAL LOW (ref 135–145)

## 2018-08-27 LAB — HEPATITIS PANEL, ACUTE
HCV Ab: <0.1 s/co ratio (ref 0.0–0.9)
Hep A IgM: NEGATIVE
Hep B C IgM: NEGATIVE
Hepatitis B Surface Ag: NEGATIVE

## 2018-08-27 LAB — PROTIME-INR
INR: 1.28
Prothrombin Time: 15.9 seconds — ABNORMAL HIGH (ref 11.4–15.2)

## 2018-08-27 LAB — COMPREHENSIVE METABOLIC PANEL
ALT: 375 U/L — ABNORMAL HIGH (ref 0–44)
AST: 190 U/L — ABNORMAL HIGH (ref 15–41)
Albumin: 3.4 g/dL — ABNORMAL LOW (ref 3.5–5.0)
Alkaline Phosphatase: 105 U/L (ref 38–126)
Anion gap: 6 (ref 5–15)
BUN: 39 mg/dL — ABNORMAL HIGH (ref 8–23)
CO2: 25 mmol/L (ref 22–32)
Calcium: 9 mg/dL (ref 8.9–10.3)
Chloride: 105 mmol/L (ref 98–111)
Creatinine, Ser: 0.88 mg/dL (ref 0.61–1.24)
GFR calc Af Amer: >60 mL/min (ref >60)
GFR calc non Af Amer: >60 mL/min (ref >60)
Glucose, Bld: 220 mg/dL — ABNORMAL HIGH (ref 70–99)
POTASSIUM: 5.9 mmol/L — AB (ref 3.5–5.1)
Sodium: 136 mmol/L (ref 135–145)
Total Bilirubin: 1.8 mg/dL — ABNORMAL HIGH (ref 0.3–1.2)
Total Protein: 7.2 g/dL (ref 6.5–8.1)

## 2018-08-27 LAB — GLUCOSE, CAPILLARY
GLUCOSE-CAPILLARY: 190 mg/dL — AB (ref 70–99)
Glucose-Capillary: 200 mg/dL — ABNORMAL HIGH (ref 70–99)
Glucose-Capillary: 205 mg/dL — ABNORMAL HIGH (ref 70–99)
Glucose-Capillary: 231 mg/dL — ABNORMAL HIGH (ref 70–99)

## 2018-08-27 LAB — APTT: aPTT: 33 seconds (ref 24–36)

## 2018-08-27 MED ORDER — METHYLPREDNISOLONE SODIUM SUCC 40 MG IJ SOLR
40.0000 mg | Freq: Four times a day (QID) | INTRAMUSCULAR | Status: DC
  Administered 2018-08-27 – 2018-08-29 (×8): 40 mg via INTRAVENOUS
  Filled 2018-08-27 (×8): qty 1

## 2018-08-27 MED ORDER — IOPAMIDOL (ISOVUE-370) INJECTION 76%
100.0000 mL | Freq: Once | INTRAVENOUS | Status: AC | PRN
  Administered 2018-08-27: 100 mL via INTRAVENOUS

## 2018-08-27 MED ORDER — SODIUM ZIRCONIUM CYCLOSILICATE 10 G PO PACK
10.0000 g | PACK | Freq: Once | ORAL | Status: AC
  Administered 2018-08-27: 10 g via ORAL
  Filled 2018-08-27: qty 1

## 2018-08-27 MED ORDER — INSULIN GLARGINE 100 UNIT/ML ~~LOC~~ SOLN
10.0000 [IU] | SUBCUTANEOUS | Status: DC
  Administered 2018-08-27: 10 [IU] via SUBCUTANEOUS
  Filled 2018-08-27 (×2): qty 0.1

## 2018-08-27 MED ORDER — INSULIN ASPART 100 UNIT/ML ~~LOC~~ SOLN
8.0000 [IU] | Freq: Three times a day (TID) | SUBCUTANEOUS | Status: DC
  Administered 2018-08-27: 8 [IU] via SUBCUTANEOUS

## 2018-08-27 MED ORDER — IPRATROPIUM-ALBUTEROL 0.5-2.5 (3) MG/3ML IN SOLN
3.0000 mL | RESPIRATORY_TRACT | Status: DC
  Administered 2018-08-27 (×2): 3 mL via RESPIRATORY_TRACT
  Filled 2018-08-27 (×3): qty 3

## 2018-08-27 MED ORDER — IPRATROPIUM-ALBUTEROL 0.5-2.5 (3) MG/3ML IN SOLN
3.0000 mL | Freq: Once | RESPIRATORY_TRACT | Status: AC
  Administered 2018-08-27: 3 mL via RESPIRATORY_TRACT
  Filled 2018-08-27: qty 3

## 2018-08-27 MED ORDER — INSULIN ASPART 100 UNIT/ML ~~LOC~~ SOLN
0.0000 [IU] | Freq: Three times a day (TID) | SUBCUTANEOUS | Status: DC
  Administered 2018-08-27: 7 [IU] via SUBCUTANEOUS
  Administered 2018-08-28 (×2): 3 [IU] via SUBCUTANEOUS

## 2018-08-27 MED ORDER — INSULIN GLARGINE 100 UNIT/ML ~~LOC~~ SOLN
25.0000 [IU] | SUBCUTANEOUS | Status: DC
  Administered 2018-08-28 – 2018-09-02 (×6): 25 [IU] via SUBCUTANEOUS
  Filled 2018-08-27 (×9): qty 0.25

## 2018-08-27 MED ORDER — INSULIN ASPART 100 UNIT/ML ~~LOC~~ SOLN: 0.0000 [IU] | Freq: Every day | SUBCUTANEOUS | Status: DC

## 2018-08-27 MED ORDER — IPRATROPIUM-ALBUTEROL 0.5-2.5 (3) MG/3ML IN SOLN
3.0000 mL | Freq: Three times a day (TID) | RESPIRATORY_TRACT | Status: DC
  Administered 2018-08-28 – 2018-09-04 (×22): 3 mL via RESPIRATORY_TRACT
  Filled 2018-08-27 (×23): qty 3

## 2018-08-27 NOTE — Progress Notes (Signed)
CODE STROKE PROTOCOL  [facility] [phone] Room 330 Dr Darrick Meigs gagan  Call time 2105  [phone] Exam started  2115 Exam finished,Images to [facility], exam completed in [place] radiology called: [phone]

## 2018-08-27 NOTE — Consult Note (Signed)
[facility] Tele[facility]urology Consult Services    Date of Service:   08/27/2018 21:32:03  Impression:      .  Acute Ischemic Stroke     .  Right Hemispheric Infarct     .  MCA Distribution Infarct  Comments: Severe right hemispheric syndrome, cortical signs, NIHSS 16. No IV tPA due to PT >15s. Pending STAT CTA head and neck looking for LVO.  Metrics: Last Known Well: 08/27/2018 19:00:00 [facility] Notification Time: 08/27/2018 21:31:13 Stamp Time: 08/27/2018 21:32:03 Time First Login Attempt: 08/27/2018 21:33:00 Video Start Time: 08/27/2018 21:33:00  Symptoms: Left sided weakness NIHSS Start Assessment Time: 08/27/2018 21:36:00 Patient is not a candidate for tPA. Patient was not deemed candidate for tPA thrombolytics because of Coagulopathy. Weight Noted by Staff: 95.5 kg Video End Time: 08/27/2018 22:23:24  CT head showed no acute hemorrhage or acute core infarct.  Advanced imaging CTA head and neck obtained.    Our recommendations are outlined below.  Recommendations:     .  Activate Stroke Protocol Admission/Order Set     .  Stroke/Telemetry Floor     .  Neuro Checks     .  Bedside Swallow Eval     .  DVT Prophylaxis     .  IV Fluids, Normal Saline     .  Head of Bed Below 30 Degrees     .  Euglycemia and Avoid Hyperthermia (PRN Acetaminophen)  Routine Consultation with [facility] for Follow up Care  Sign Out:     .  Discussed with Rapid Response Team Dr. Darrick Meigs    ------------------------------------------------------------------------------  History of Present Illness:  Patient is a 67 year old Male.  Inpatient stroke alert was called for symptoms of Left sided weakness  In house after syncope and found down, COPD exacerbation, renal failure. Out of ICU today. Normal at shift change 19:00, at 20:30 left sided deficit. Not on acticoagulations. No known ICH. Coags pending, none available form current admission and liver disease. Elevated  troponin, no WMA on TTE. On IV Heparin until 12/10 PM, now on SQ Heparin DVT prophylaxis  CT head showed no acute hemorrhage or acute core infarct. Advanced imaging pending.  Coagulation studies reported at 21:11  Last seen normal was within 4.5 hours. There is history of hemorrhagic complications or intracranial hemorrhage. There is no history of Recent Anticoagulants. There is no history of recent major surgery. There is no history of recent stroke.  Examination: 1A: Level of Consciousness - Alert; keenly responsive + 0 1B: Ask Month and Age - Aphasic + 2 1C: Blink Eyes & Squeeze Hands - Performs Both Tasks + 0 2: Test Horizontal Extraocular Movements - Forced Gaze Palsy: Cannot Be Overcome + 2 3: Test Visual Fields - Complete Hemianopia + 2 4: Test Facial Palsy (Use Grimace if Obtunded) - Minor paralysis (flat nasolabial fold, smile asymetry) + 1 5A: Test Left Arm Motor Drift - Drift, but doesn't hit bed + 1 5B: Test Right Arm Motor Drift - No Drift for 10 Seconds + 0 6A: Test Left Leg Motor Drift - Some Effort Against Gravity + 2 6B: Test Right Leg Motor Drift - No Drift for 5 Seconds + 0 7: Test Limb Ataxia (FNF/Heel-Shin) - No Ataxia + 0 8: Test Sensation - Mild-Moderate Loss: Less Sharp/More Dull + 1 9: Test Language/Aphasia - Mild-Moderate Aphasia: Some Obvious Changes, Without Significant Limitation + 1 10: Test Dysarthria - Severe Dysarthria: Unintelligble Slurring or Out of Proportion to Dysphasia + 2  11: Test Extinction/Inattention - Profound hemi-inattention (ex: does not recognize own hand) + 2  NIHSS Score: 16  Patient was informed the Neurology Consult would happen via TeleHealth consult by way of interactive audio and video telecommunications and consented to receiving care in this manner.  Due to the immediate potential for life-threatening deterioration due to underlying acute neurologic illness, I spent 35 minutes providing critical care. This time includes time  for face to face visit via telemedicine, review of medical records, imaging studies and discussion of findings with providers, the patient and/or family.   Dr Lita Mains   [facility] [phone]   Case [id number]

## 2018-08-27 NOTE — Evaluation (Signed)
Physical Therapy Evaluation Patient Details Name: Clayton [name] MRN: [id number] DOB: October 07, 1950 Today's Date: 08/27/2018   History of Present Illness  Clayton [name] is a 67 y.o. male with medical history significant of COPD on home oxygen, tobacco use, hypertension.  Patient reports that he is chronically short of breath.  His family notes that over the past week to 2 weeks, he has been increasingly short of breath.  He does have a cough.  Family has noticed more wheezing.  He has not had any fever.  Last week, he developed a GI illness with significant diarrhea.  He also had nausea and some vomiting.  Stools were brown, no melena or hematochezia was reported.  His p.o. intake has remained poor and he noticed diminished urine output.  His symptoms have continued into this week.  His family checked on him today and found him laying on the bathroom floor.  He denies any chest pain    Clinical Impression  Patient demonstrates slow labored movement for sitting up at bedside, sit to stands and transfers, able to ambulate in hallway with slow labored cadence without loss of balance while on 4 LPM O2, limited secondary to c/o SOB, fatigue.  Patient had to sit in w/c to recover from SOB, but became worse and had to be wheeled back to room, his O2 increased to 6 LPM while wheeling him back to room and reattached to wall at 5 LPM, patient had near syncopal episode and put back to bed with rapid response team called and present in room after therapy.  Patient will benefit from continued physical therapy in hospital and recommended venue below to increase strength, balance, endurance for safe ADLs and gait.    Follow Up Recommendations SNF    Equipment Recommendations  Rolling walker with 5" wheels    Recommendations for Other Services       Precautions / Restrictions Precautions Precautions: Fall Precaution Comments: Home O2 dependent Restrictions Weight Bearing Restrictions: No      Mobility   Bed Mobility Overal bed mobility: Needs Assistance Bed Mobility: Supine to Sit;Sit to Supine     Supine to sit: Min guard Sit to supine: Mod assist   General bed mobility comments: diffiuclty putting back to bed due to near syncopal episode  Transfers Overall transfer level: Needs assistance Equipment used: Rolling walker (2 wheeled) Transfers: Sit to/from Stand Sit to Stand: Min assist Stand pivot transfers: Min assist       General transfer comment: slow labored movement with frequent leaning on wall for support  Ambulation/Gait Ambulation/Gait assistance: Min assist Gait Distance (Feet): 50 Feet Assistive device: Rolling walker (2 wheeled) Gait Pattern/deviations: Decreased step length - right;Decreased step length - left;Decreased stride length Gait velocity: slow   General Gait Details: slow slightly labored cadence without loss of balance, limited secondary to SOB, fatigue while on 4 LPM O2  Stairs            Wheelchair Mobility    Modified Rankin (Stroke Patients Only)       Balance Overall balance assessment: Needs assistance Sitting-balance support: Feet supported;No upper extremity supported Sitting balance-Leahy Scale: Good     Standing balance support: During functional activity;No upper extremity supported Standing balance-Leahy Scale: Poor Standing balance comment: fair using RW                             Pertinent Vitals/Pain Pain Assessment: No/denies pain    Home Living  Family/patient expects to be discharged to:: Private residence Living Arrangements: Alone Available Help at Discharge: Family;Available PRN/intermittently Type of Home: House(funeral home) Home Access: Stairs to enter Entrance Stairs-Rails: Right Entrance Stairs-Number of Steps: 20 Home Layout: Two level Home Equipment: Cane - single point;Walker - 2 wheels;Wheelchair - manual Additional Comments: Patient states he has access to RW, SPC, W/C, lives in  the top floor of a funeral home     Prior Function Level of Independence: Independent         Comments: community ambulator, drives     Journalist, newspaper        Extremity/Trunk Assessment   Upper Extremity Assessment Upper Extremity Assessment: Generalized weakness    Lower Extremity Assessment Lower Extremity Assessment: Generalized weakness    Cervical / Trunk Assessment Cervical / Trunk Assessment: Normal  Communication   Communication: No difficulties  Cognition   Behavior During Therapy: WFL for tasks assessed/performed Overall Cognitive Status: Within Functional Limits for tasks assessed                                        General Comments      Exercises     Assessment/Plan    PT Assessment Patient needs continued PT services  PT Problem List Decreased strength;Decreased activity tolerance;Decreased balance;Decreased mobility       PT Treatment Interventions Gait training;Stair training;Functional mobility training;Therapeutic activities;Therapeutic exercise;Patient/family education    PT Goals (Current goals can be found in the Care Plan section)  Acute Rehab PT Goals Patient Stated Goal: return home PT Goal Formulation: With patient Time For Goal Achievement: 09/10/18 Potential to Achieve Goals: Good    Frequency Min 3X/week   Barriers to discharge        Co-evaluation               AM-PAC PT "6 Clicks" Mobility  Outcome Measure Help needed turning from your back to your side while in a flat bed without using bedrails?: None Help needed moving from lying on your back to sitting on the side of a flat bed without using bedrails?: A Little Help needed moving to and from a bed to a chair (including a wheelchair)?: Total Help needed standing up from a chair using your arms (e.g., wheelchair or bedside chair)?: A Little Help needed to walk in hospital room?: A Little Help needed climbing 3-5 steps with a railing? : A  Lot 6 Click Score: 16    End of Session Equipment Utilized During Treatment: Gait belt;Oxygen Activity Tolerance: Patient limited by fatigue;Patient tolerated treatment well;Other (comment)(limited due to near syncopal episode after walking) Patient left: in bed;with call bell/phone within reach;with bed alarm set;with nursing/sitter in room Nurse Communication: Mobility status PT Visit Diagnosis: Unsteadiness on feet (R26.81);Other abnormalities of gait and mobility (R26.89);Muscle weakness (generalized) (M62.81)    Time: 5597-4163 PT Time Calculation (min) (ACUTE ONLY): 37 min   Charges:   PT Evaluation $PT Eval High Complexity: 1 High PT Treatments $Therapeutic Activity: 23-37 mins        12:32 PM, 08/27/18 Lonell Grandchild, MPT Physical Therapist with [facility] [phone] office [phone] mobile phone

## 2018-08-27 NOTE — Progress Notes (Signed)
PROGRESS NOTE    Clayton [name]  VOJ:[id number] DOB: 07-22-1951 DOA: 08/24/2018 PCP: Dione Housekeeper, MD    Brief Narrative:  67 year old male with a history of COPD on home oxygen, presented to the hospital with shortness of breath.  He has been having vomiting and diarrhea for several days prior to admission with decreased p.o. intake.  He has been found on the floor of his bathroom by his family.  He likely passed out due to volume depletion. He was noted to be in acute renal failure, was dehydrated and had COPD exacerbation.   Assessment & Plan:   Active Problems:   TOBACCO ABUSE   CAD S/P percutaneous coronary angioplasty   COPD exacerbation (HCC)   DM type 2 (diabetes mellitus, type 2) (HCC)   Acute on chronic respiratory failure with hypoxia (HCC)   Elevated troponin   Elevated LFTs   AKI (acute kidney injury) ([place])   Acute gastroenteritis   HTN (hypertension)   Dehydration   1. Acute on chronic respiratory failure.  Likely related to COPD exacerbation.  He had a brief desaturation after ambulating and PT recommending SNF as he is severely deconditioned.  He was initially requiring 10 L of oxygen, is currently on 5-7 L.  Continue to wean as tolerated.  2. COPD exacerbation.  Schedule duonebs to every 4 hours.  STAT duoneb to be given now.  Currently on intravenous steroids, bronchodilators and antibiotics.  Increase steroids to every 6 hours.   3. Acute kidney injury.  Resolved now.  Related to GI losses.  On IV hydration with improvement of creatinine.  No hydronephrosis noted on ultrasound. 4. Acute gastroenteritis.  Patient reported significant diarrhea prior to admission.  C. difficile found to be negative.  GI pathogen panel in process.  Overall symptoms have improved. 5. Demand ischemia.  Patient had elevated troponin without any complaints of chest pain or EKG changes.  Echocardiogram did not show any wall motion changes.  Initially started on intravenous heparin.  After  seen by cardiology, heparin has been discontinued.  Recommendations are for supportive treatment. 6. Type 2 diabetes mellitus with steroid induced hyperglycemia - Metformin and glipizide currently on hold.  Continue on sliding scale insulin.  Blood sugars labile on steroids.  SSI plus prandial insulin ordered.  7. Lactic acidosis.  Related to dehydration and metformin use.  Continue hydration. 8. Hypertension.  Blood pressures been stable.  Restart on home dose of Coreg. 9. Elevated liver enzymes.  Likely related to dehydration and volume depletion.  Mild improvement with IV fluids. Right upper quadrant ultrasound did not show any acute findings.  We will also check acute hepatitis panel.   DVT prophylaxis: Heparin Code Status: Full code Family Communication: No family present Disposition Plan: SNF recommended.    Consultants:   Cardiology  Procedures:  Echocardiogram:- Left ventricle: The cavity size was normal. There was mild   concentric hypertrophy. Systolic function was normal. The   estimated ejection fraction was in the range of 50% to 55%. There   was septal-lateral dyssynchrony. Doppler parameters are   consistent with abnormal left ventricular relaxation (grade 1   diastolic dysfunction). - Ventricular septum: Septal motion showed abnormal function and   dyssynergy. These changes are consistent with intraventricular   conduction delay. - Aortic valve: Trileaflet; mildly thickened, moderately calcified   leaflets. Morphologically, there appears to be mild calcific   aortic stenosis. - Mitral valve: Mildly calcified annulus. - Right ventricle: The cavity size was mildly dilated. Systolic  function was moderately reduced. - Right atrium: The atrium was mildly dilated. - Tricuspid valve: There was moderate regurgitation. - Pulmonary arteries: PA peak pressure: 57 mm Hg (S). - Inferior vena cava: The vessel was dilated. The respirophasic   diameter changes were blunted (<  50%), consistent with elevated    central venous pressure. Estimated CVP 15 mmHg.  Antimicrobials:   Azithromycin 12/9 >12/12  Doxycycline 12/12 >   Subjective: Came to see patient for rapid response event.  Pt became acutely SOB after ambulating with PT. He desaturated to 80s.  He is feeling better now with increased supplemental oxygen.  He is wheezing and SOB.  He denies chest pain.    Objective: Vitals:   08/27/18 0427 08/27/18 0918 08/27/18 0923 08/27/18 0946  BP:    132/83  Pulse:    72  Resp:    18  Temp: 98.1 F (36.7 C)   (!) 97.5 F (36.4 C)  TempSrc: Oral   Oral  SpO2:  90% 95% (!) 89%  Weight: 95 kg     Height:        Intake/Output Summary (Last 24 hours) at 08/27/2018 1027 Last data filed at 08/27/2018 0427 Gross per 24 hour  Intake -  Output 2000 ml  Net -2000 ml   Filed Weights   08/25/18 0500 08/26/18 0500 08/27/18 0427  Weight: 92.8 kg 94.3 kg 95 kg    Examination:  General exam: Alert, awake, oriented x 3 Respiratory system: Diminished breath sounds bilaterally with diffuse wheezing.  Moderate increased WOB. Cardiovascular system:RRR. No murmurs, rubs, gallops. Gastrointestinal system: Abdomen is nondistended, soft and nontender. No organomegaly or masses felt. Normal bowel sounds heard. Central nervous system: Alert and oriented. No focal neurological deficits. Extremities: trace pretibial edema bilaterally.  Skin: No rashes, lesions or ulcers Psychiatry: Judgement and insight appear normal. Mood & affect appropriate.      Data Reviewed: I have personally reviewed following labs and imaging studies  CBC: Recent Labs  Lab 08/24/18 1137 08/24/18 1149 08/25/18 0407 08/26/18 0409 08/27/18 0403  WBC 5.7  --  5.0 7.6 8.2  NEUTROABS 4.1  --   --   --   --   HGB 12.5* 14.6 11.4* 12.0* 12.2*  HCT 41.1 43.0 37.8* 39.1 40.8  MCV 94.3  --  94.5 95.8 98.3  PLT 213  --  178 213 324   Basic Metabolic Panel: Recent Labs  Lab 08/24/18 1149  08/24/18 1408 08/25/18 0407 08/26/18 0409 08/27/18 0403  NA 136  --  136 136 136  K 5.9* 5.5* 5.6* 5.4* 5.9*  CL 100  --  103 105 105  CO2  --   --  24 24 25   GLUCOSE 78  --  176* 202* 220*  BUN 55*  --  59* 45* 39*  CREATININE 4.20*  --  2.37* 1.06 0.88  CALCIUM  --   --  8.3* 8.9 9.0   GFR: Estimated Creatinine Clearance: 98.8 mL/min (by C-G formula based on SCr of 0.88 mg/dL). Liver Function Tests: Recent Labs  Lab 08/24/18 1137 08/25/18 0407 08/26/18 0409 08/27/18 0403  AST 484* 281* 208* 190*  ALT 349* 308* 351* 375*  ALKPHOS 141* 116 116 105  BILITOT 3.1* 2.3* 2.1* 1.8*  PROT 8.7* 7.3 7.7 7.2  ALBUMIN 4.1 3.4* 3.5 3.4*   Recent Labs  Lab 08/24/18 1137  LIPASE 23   No results for input(s): AMMONIA in the last 168 hours. Coagulation Profile: No results for input(s):  INR, PROTIME in the last 168 hours. Cardiac Enzymes: Recent Labs  Lab 08/24/18 1146 08/24/18 1644 08/24/18 2214 08/25/18 0407  CKTOTAL 574*  --   --   --   TROPONINI  --  1.24* 0.81* 0.59*   BNP (last 3 results) No results for input(s): PROBNP in the last 8760 hours. HbA1C: No results for input(s): HGBA1C in the last 72 hours. CBG: Recent Labs  Lab 08/26/18 0738 08/26/18 1120 08/26/18 1611 08/26/18 2101 08/27/18 0738  GLUCAP 189* 238* 208* 177* 200*   Lipid Profile: No results for input(s): CHOL, HDL, LDLCALC, TRIG, CHOLHDL, LDLDIRECT in the last 72 hours. Thyroid Function Tests: No results for input(s): TSH, T4TOTAL, FREET4, T3FREE, THYROIDAB in the last 72 hours. Anemia Panel: No results for input(s): VITAMINB12, FOLATE, FERRITIN, TIBC, IRON, RETICCTPCT in the last 72 hours. Sepsis Labs: Recent Labs  Lab 08/24/18 1145 08/24/18 1258  LATICACIDVEN 2.42* 2.58*    Recent Results (from the past 240 hour(s))  MRSA PCR Screening     Status: None   Collection Time: 08/25/18 12:21 AM  Result Value Ref Range Status   MRSA by PCR NEGATIVE NEGATIVE Final    Comment:        The  GeneXpert MRSA Assay (FDA approved for NASAL specimens only), is one component of a comprehensive MRSA colonization surveillance program. It is not intended to diagnose MRSA infection nor to guide or monitor treatment for MRSA infections. Performed at [facility], [address], [place], [place] [address]   C difficile quick scan w PCR reflex     Status: None   Collection Time: 08/25/18  5:13 PM  Result Value Ref Range Status   C Diff antigen NEGATIVE NEGATIVE Final   C Diff toxin NEGATIVE NEGATIVE Final   C Diff interpretation No C. difficile detected.  Final    Comment: Performed at [facility], [address] [place], [place] [address]    Radiology Studies: US Abdomen Complete  Result Date: 08/25/2018 CLINICAL DATA:  Elevated liver function studies. Acute renal injury. History of prostate malignancy and diabetes. EXAM: ABDOMEN ULTRASOUND COMPLETE COMPARISON:  None. FINDINGS: Gallbladder: The gallbladder is adequately distended. No stones or sludge are observed. There is borderline thickening of the gallbladder wall at 4 mm but no pericholecystic fluid or positive sonographic Murphy's sign. Common bile duct: Diameter: 3 mm Liver: No focal lesion identified. Within normal limits in parenchymal echogenicity. There is a small amount of perihepatic fluid. Portal vein is patent on color Doppler imaging with normal direction of blood flow towards the liver. IVC: No abnormality visualized. Pancreas: The pancreas was obscured by bowel gas. Spleen: Size and appearance within normal limits. Right Kidney: Length: 12.9 cm. The renal cortical echotexture is increased and is greater than that of the liver. There is a septated cyst in the mid to lower pole cortex measuring 2.1 x 1.9 x 1.8 cm Left Kidney: Length: 13.4 cm. The renal cortical echotexture on the left is mildly increased similar to that on the right. There are 3 cystic structures in the left kidney. The largest contains a septation and lies  in the lower pole and measures 3.3 x 3.2 x 3.4 cm. Two additional cysts are present which are simple cystic in nature. There is no hydronephrosis. Abdominal aorta: Bowel gas obscures the mid and distal aorta and bifurcation. There is a small right pleural effusion. Other findings: None. IMPRESSION: Increased renal cortical echotexture consistent with medical renal disease. No hydronephrosis. Septated cysts in both kidneys.  Normal appearance of the liver. Small amount of perihepatic ascites. Small right pleural effusion. Nonvisualization of the pancreas due to bowel gas. Electronically Signed   By: David  Martinique M.D.   On: 08/25/2018 12:15   Scheduled Meds: . aspirin EC  81 mg Oral Daily  . carvedilol  6.25 mg Oral BID WC  . guaiFENesin  1,200 mg Oral BID  . heparin injection (subcutaneous)  5,000 Units Subcutaneous Q8H  . insulin aspart  0-15 Units Subcutaneous TID WC  . insulin aspart  0-5 Units Subcutaneous QHS  . insulin glargine  10 Units Subcutaneous BH-q7a  . ipratropium-albuterol  3 mL Nebulization Q4H  . methylPREDNISolone (SOLU-MEDROL) injection  40 mg Intravenous Q12H  . mometasone-formoterol  2 puff Inhalation BID  . nicotine  21 mg Transdermal Daily   Continuous Infusions: . sodium chloride 50 mL/hr at 08/27/18 0635     LOS: 3 days   Critical Care Time spent: 32 minutes  Irwin Brakeman, MD [facility] Pager [phone]  If 7PM-7AM, please contact night-coverage [email or web address] Password [facility] 08/27/2018, 10:27 AM

## 2018-08-27 NOTE — Progress Notes (Signed)
Pt was found on his knees in the floor by the bedside in a praying manor. Asked the patient if he fell out of bed and he stated "I didn't fall! I slid out of bed trying to go to the restroom." Pt call light was within reach, bed alarm on and side rails up. We helped patient back into bed. No injuries were sustained. Changed the foam dressings on his knees from a previous fall before admission. Pt is in bed, talking and joking with staff.

## 2018-08-27 NOTE — Clinical Social Work Note (Signed)
CSW attempted to speak to patient about option of SNF per PT recommendation.  Unable to wake patient.  Was told by nursing that he would currently not be able to meaningfully engage in conversation.

## 2018-08-27 NOTE — Progress Notes (Addendum)
Entered patients room to preform shift assessment on patient. Patient lying in bed at this time with high flow nasal canula at 10 liters. Patient is alert but not able to follow commands. Patient has some left sided facial droop. Informed charge nurse of patients status. Charge nurse assessed patient. Obtain vital signs B/P 148/76, pulse 72, O2 98 on 10  Liters high flow nasal canula. Obtained CBG, CBG reading was 196. Paged mid level of patients status. Mid level called stating she would page the ED doctor to assess patient. Patient is currently resting in bed no signs of distress noted. Will continue to monitor throughout shift. At 2045 Dr. Darrick Meigs came to patients room did neurological assessment. Dr. Darrick Meigs ordered Stat CT of head and to consult with teleneurologist. Patient transported down for CT at 2116 and returned to floor from CT at 2120. Patient tolerated CT scan well. At 2130 Teleneurologist spoke with nurse, patient and ED doctor. At 2145 teleneruolgist gave orders for stat PT and INR Doctor [place] placed orders in epic. At 2224 Orders was placed for stat CT angio of head and neck. AC and ICU nurses placed 18g iv to patients right posterior forearm. Patient trasnfered for CT at 2330. At 2347 patient transferred back to floor. At 1215 paged Teleneurologist of patients CT results. Teleneurologist gave order to transfer patient to facility for clot and thrombus removal STAT. At 1220 paged Dr.Lama to inform teleneurologist order for transfer. Informed patient and patients family that he will be transferred to [facility]. Patient is resting in bed awaiting transfer. Will continue to monitor.

## 2018-08-27 NOTE — NC FL2 (Signed)
[place] LEVEL OF CARE SCREENING TOOL     IDENTIFICATION  Patient Name: Clayton [name] Birthdate: 1950/10/19 Sex: male Admission Date (Current Location): 08/24/2018  [facility] and [place] Number:  [employer] and Address:  [facility] [address] [place]      Provider Number: [id number]  Attending Physician Name and Address:  Murlean Iba, MD  Relative Name and Phone Number:  Mavin Dyke (brother) [phone]    Current Level of Care: Hospital Recommended Level of Care: [place] Prior Approval Number:    Date Approved/Denied:   PASRR Number: [id number]  Discharge Plan: SNF    Current Diagnoses: Patient Active Problem List   Diagnosis Date Noted  . Elevated LFTs 08/24/2018  . AKI (acute kidney injury) ([place]) 08/24/2018  . Acute gastroenteritis 08/24/2018  . HTN (hypertension) 08/24/2018  . Dehydration 08/24/2018  . Guaiac positive stools 07/03/2018  . Acute on chronic respiratory failure with hypoxia ([place]) 05/13/2017  . Elevated troponin 05/13/2017  . COPD exacerbation ([place]) 06/28/2015  . DM type 2 (diabetes mellitus, type 2) ([place]) 06/28/2015  . Erectile dysfunction associated with type 2 diabetes mellitus ([place]) 07/12/2014  . CAD S/P percutaneous coronary angioplasty 04/11/2014  . Prostate cancer ([place]) 04/27/2012  . Abnormal ECG 01/06/2012  . TOBACCO ABUSE 11/23/2010  . GERD 12/12/2009  . DIVERTICULOSIS, COLON 12/12/2009  . COLONIC POLYPS, BENIGN, HX OF 12/12/2009  . ANEMIA 12/04/2009  . HYPERLIPIDEMIA 09/15/2008  . MYOCARDIAL INFARCTION, HX OF 09/15/2008  . PERIPHERAL VASCULAR DISEASE 09/15/2008  . UNSPECIFIED IRON DEFICIENCY ANEMIA 02/15/2008  . BLOOD IN STOOL, OCCULT 02/15/2008    Orientation RESPIRATION BLADDER Height & Weight     Self, Time, Situation, Place  (see dc summary) Continent Weight: 209 lb 7 oz (95 kg) Height:  6' (182.9 cm)  BEHAVIORAL SYMPTOMS/MOOD NEUROLOGICAL  BOWEL NUTRITION STATUS      Continent Diet(see dc summary)  AMBULATORY STATUS COMMUNICATION OF NEEDS Skin   Extensive Assist Verbally Normal                       Personal Care Assistance Level of Assistance  Bathing, Feeding, Dressing Bathing Assistance: Limited assistance Feeding assistance: Independent Dressing Assistance: Limited assistance     Functional Limitations Info  Sight, Hearing, Speech Sight Info: Adequate Hearing Info: Adequate Speech Info: Adequate    SPECIAL CARE FACTORS FREQUENCY  PT (By licensed PT)     PT Frequency: 5 times week              Contractures Contractures Info: Not present    Additional Factors Info  Code Status, Allergies Code Status Info: full Allergies Info: NKA           Current Medications (08/27/2018):  This is the current hospital active medication list Current Facility-Administered Medications  Medication Dose Route Frequency Provider Last Rate Last Dose  . 0.9 %  sodium chloride infusion   Intravenous Continuous Wynetta Emery, Clanford L, MD 50 mL/hr at 08/27/18 [phone]    . acetaminophen (TYLENOL) tablet 650 mg  650 mg Oral Q6H PRN Kathie Dike, MD       Or  . acetaminophen (TYLENOL) suppository 650 mg  650 mg Rectal Q6H PRN Kathie Dike, MD      . albuterol (PROVENTIL) (2.5 MG/3ML) 0.083% nebulizer solution 2.5 mg  2.5 mg Nebulization Q4H PRN Kathie Dike, MD      . aspirin EC tablet 81 mg  81 mg Oral  Daily Kathie Dike, MD   81 mg at 08/27/18 0900  . carvedilol (COREG) tablet 6.25 mg  6.25 mg Oral BID WC Kathie Dike, MD   6.25 mg at 08/27/18 0753  . guaiFENesin (MUCINEX) 12 hr tablet 1,200 mg  1,200 mg Oral BID Kathie Dike, MD   1,200 mg at 08/27/18 0900  . heparin injection 5,000 Units  5,000 Units Subcutaneous Q8H Kathie Dike, MD   5,000 Units at 08/27/18 0519  . insulin aspart (novoLOG) injection 0-20 Units  0-20 Units Subcutaneous TID WC Johnson, Clanford L, MD      . insulin aspart (novoLOG)  injection 0-5 Units  0-5 Units Subcutaneous QHS Johnson, Clanford L, MD      . insulin aspart (novoLOG) injection 8 Units  8 Units Subcutaneous TID WC Johnson, Clanford L, MD      . Derrill Memo ON 08/28/2018] insulin glargine (LANTUS) injection 25 Units  25 Units Subcutaneous BH-q7a Johnson, Clanford L, MD      . ipratropium-albuterol (DUONEB) 0.5-2.5 (3) MG/3ML nebulizer solution 3 mL  3 mL Nebulization Q4H Johnson, Clanford L, MD      . methylPREDNISolone sodium succinate (SOLU-MEDROL) 40 mg/mL injection 40 mg  40 mg Intravenous Q6H Johnson, Clanford L, MD      . mometasone-formoterol (DULERA) 200-5 MCG/ACT inhaler 2 puff  2 puff Inhalation BID Kathie Dike, MD   2 puff at 08/27/18 0923  . nicotine (NICODERM CQ - dosed in mg/24 hours) patch 21 mg  21 mg Transdermal Daily Kathie Dike, MD   Stopped at 08/27/18 1000  . ondansetron (ZOFRAN) tablet 4 mg  4 mg Oral Q6H PRN Kathie Dike, MD   4 mg at 08/25/18 2150   Or  . ondansetron (ZOFRAN) injection 4 mg  4 mg Intravenous Q6H PRN Kathie Dike, MD         Discharge Medications: Please see discharge summary for a list of discharge medications.  Relevant Imaging Results:  Relevant Lab Results:   Additional Information SSN: [id number], LCSW

## 2018-08-27 NOTE — Progress Notes (Signed)
CTA head and neck still pending

## 2018-08-27 NOTE — Consult Note (Signed)
[facility] Tele[facility]urology Consult STROKE ALERT  67 yo LH male, LKN 19:00 during shift change. At 20:30 identified to have left sided deficit. Severe right hemispheric syndrome, NIHSS 16, CT head nothing acute. Eligible for IV tPA pending coagulations studies. Coags needed due to liver disease, also on IV heparin until 12/10. Will need STAT CTA head nad neck as well, looking for LVO. If LVO identified will need transfer for NIR.  Patient being transferred to stepdown unit. On call attending Dr. Darrick Meigs at bedside, family informed via phone. Family provides consent for IV tPA.

## 2018-08-27 NOTE — Progress Notes (Signed)
Paged by nurse practitioner Chaney Malling, that patient in 44 might be having stroke as per RN. Patient seen and examined He has developed left-sided facial droop, not moving left upper and lower extremity.  This is a new change during this hospitalization.  No previous history of stroke. On exam Motor 1/5 in both left upper and lower extremity, 5/5 in right upper and lower extremity Left-sided facial droop  Assessment Acute stroke  Plan Stat CT head, Initiate code stroke Tele neurology consultation for further recommendations.

## 2018-08-27 NOTE — Plan of Care (Signed)
  Problem: Acute Rehab PT Goals(only PT should resolve) Goal: Pt Will Go Supine/Side To Sit Flowsheets (Taken 08/27/2018 1234) Pt will go Supine/Side to Sit: with supervision Goal: Patient Will Transfer Sit To/From Stand Flowsheets (Taken 08/27/2018 1234) Patient will transfer sit to/from stand: with min guard assist Goal: Pt Will Transfer Bed To Chair/Chair To Bed Flowsheets (Taken 08/27/2018 1234) Pt will Transfer Bed to Chair/Chair to Bed: min guard assist Goal: Pt Will Ambulate Flowsheets (Taken 08/27/2018 1234) Pt will Ambulate: 50 feet; with supervision; with rolling walker   12:35 PM, 08/27/18 Lonell Grandchild, MPT Physical Therapist with [facility] [address] [phone] office [phone] mobile phone

## 2018-08-28 ENCOUNTER — Inpatient Hospital Stay (HOSPITAL_COMMUNITY): Payer: Medicare Other

## 2018-08-28 ENCOUNTER — Inpatient Hospital Stay (HOSPITAL_COMMUNITY): Payer: Medicare Other | Admitting: Anesthesiology

## 2018-08-28 ENCOUNTER — Inpatient Hospital Stay (HOSPITAL_COMMUNITY)
Admission: EM | Disposition: A | Discharge status: Nursing Home (Includes ICF) | Payer: Self-pay | Source: Home / Self Care | Attending: Neurology

## 2018-08-28 DIAGNOSIS — I6601 Occlusion and stenosis of right middle cerebral artery: Secondary | ICD-10-CM | POA: Diagnosis present

## 2018-08-28 DIAGNOSIS — E875 Hyperkalemia: Secondary | ICD-10-CM

## 2018-08-28 DIAGNOSIS — I639 Cerebral infarction, unspecified: Secondary | ICD-10-CM | POA: Diagnosis present

## 2018-08-28 HISTORY — PX: IR PERCUTANEOUS ART THROMBECTOMY/INFUSION INTRACRANIAL INC DIAG ANGIO: IMG6087

## 2018-08-28 HISTORY — PX: IR CT HEAD LTD: IMG2386

## 2018-08-28 HISTORY — PX: RADIOLOGY WITH ANESTHESIA: SHX6223

## 2018-08-28 LAB — CBC WITH DIFFERENTIAL/PLATELET
Abs Immature Granulocytes: 0.04 10*3/uL (ref 0.00–0.07)
Basophils Absolute: 0 10*3/uL (ref 0.0–0.1)
Basophils Relative: 0 %
Eosinophils Absolute: 0 10*3/uL (ref 0.0–0.5)
Eosinophils Relative: 0 %
HEMATOCRIT: 38.9 % — AB (ref 39.0–52.0)
Hemoglobin: 11.3 g/dL — ABNORMAL LOW (ref 13.0–17.0)
Immature Granulocytes: 1 %
Lymphocytes Relative: 7 %
Lymphs Abs: 0.4 10*3/uL — ABNORMAL LOW (ref 0.7–4.0)
MCH: 27.7 pg (ref 26.0–34.0)
MCHC: 29 g/dL — AB (ref 30.0–36.0)
MCV: 95.3 fL (ref 80.0–100.0)
Monocytes Absolute: 0.4 10*3/uL (ref 0.1–1.0)
Monocytes Relative: 7 %
Neutro Abs: 5 10*3/uL (ref 1.7–7.7)
Neutrophils Relative %: 85 %
Platelets: 190 10*3/uL (ref 150–400)
RBC: 4.08 MIL/uL — ABNORMAL LOW (ref 4.22–5.81)
RDW: 14.3 % (ref 11.5–15.5)
WBC: 5.9 10*3/uL (ref 4.0–10.5)
nRBC: 0 % (ref 0.0–0.2)

## 2018-08-28 LAB — POCT I-STAT 3, ART BLOOD GAS (G3+)
Acid-Base Excess: 1 mmol/L (ref 0.0–2.0)
Bicarbonate: 27.6 mmol/L (ref 20.0–28.0)
O2 Saturation: 91 %
Patient temperature: 34.9
TCO2: 29 mmol/L (ref 22–32)
pCO2 arterial: 46.8 mmHg (ref 32.0–48.0)
pH, Arterial: 7.369 (ref 7.350–7.450)
pO2, Arterial: 56 mmHg — ABNORMAL LOW (ref 83.0–108.0)

## 2018-08-28 LAB — POCT I-STAT 7, (LYTES, BLD GAS, ICA,H+H)
Acid-base deficit: 3 mmol/L — ABNORMAL HIGH (ref 0.0–2.0)
Bicarbonate: 27 mmol/L (ref 20.0–28.0)
Calcium, Ion: 1.35 mmol/L (ref 1.15–1.40)
HEMATOCRIT: 40 % (ref 39.0–52.0)
Hemoglobin: 13.6 g/dL (ref 13.0–17.0)
O2 Saturation: 93 %
Patient temperature: 97.5
Potassium: 5.6 mmol/L — ABNORMAL HIGH (ref 3.5–5.1)
SODIUM: 138 mmol/L (ref 135–145)
TCO2: 29 mmol/L (ref 22–32)
pCO2 arterial: 68.2 mmHg (ref 32.0–48.0)
pH, Arterial: 7.202 — ABNORMAL LOW (ref 7.350–7.450)
pO2, Arterial: 82 mmHg — ABNORMAL LOW (ref 83.0–108.0)

## 2018-08-28 LAB — LIPID PANEL
Cholesterol: 112 mg/dL (ref 0–200)
HDL: 32 mg/dL — ABNORMAL LOW (ref >40)
LDL Cholesterol: 65 mg/dL (ref 0–99)
Total CHOL/HDL Ratio: 3.5 RATIO
Triglycerides: 74 mg/dL (ref <150)
VLDL: 15 mg/dL (ref 0–40)

## 2018-08-28 LAB — GLUCOSE, CAPILLARY
GLUCOSE-CAPILLARY: 171 mg/dL — AB (ref 70–99)
Glucose-Capillary: 113 mg/dL — ABNORMAL HIGH (ref 70–99)
Glucose-Capillary: 113 mg/dL — ABNORMAL HIGH (ref 70–99)
Glucose-Capillary: 136 mg/dL — ABNORMAL HIGH (ref 70–99)
Glucose-Capillary: 136 mg/dL — ABNORMAL HIGH (ref 70–99)
Glucose-Capillary: 151 mg/dL — ABNORMAL HIGH (ref 70–99)
Glucose-Capillary: 187 mg/dL — ABNORMAL HIGH (ref 70–99)

## 2018-08-28 LAB — BASIC METABOLIC PANEL
Anion gap: 8 (ref 5–15)
BUN: 28 mg/dL — ABNORMAL HIGH (ref 8–23)
CHLORIDE: 104 mmol/L (ref 98–111)
CO2: 25 mmol/L (ref 22–32)
Calcium: 9 mg/dL (ref 8.9–10.3)
Creatinine, Ser: 0.7 mg/dL (ref 0.61–1.24)
GFR calc Af Amer: >60 mL/min (ref >60)
GFR calc non Af Amer: >60 mL/min (ref >60)
Glucose, Bld: 138 mg/dL — ABNORMAL HIGH (ref 70–99)
Potassium: 5.5 mmol/L — ABNORMAL HIGH (ref 3.5–5.1)
SODIUM: 137 mmol/L (ref 135–145)

## 2018-08-28 LAB — RAPID URINE DRUG SCREEN, HOSP PERFORMED
Amphetamines: NOT DETECTED
Barbiturates: NOT DETECTED
Benzodiazepines: NOT DETECTED
Cocaine: NOT DETECTED
Opiates: NOT DETECTED
Tetrahydrocannabinol: NOT DETECTED

## 2018-08-28 LAB — TRIGLYCERIDES: Triglycerides: 77 mg/dL (ref <150)

## 2018-08-28 LAB — MAGNESIUM: Magnesium: 1.5 mg/dL — ABNORMAL LOW (ref 1.7–2.4)

## 2018-08-28 LAB — PHOSPHORUS: Phosphorus: 3.1 mg/dL (ref 2.5–4.6)

## 2018-08-28 LAB — HEMOGLOBIN A1C
Hgb A1c MFr Bld: 5.9 % — ABNORMAL HIGH (ref 4.8–5.6)
Mean Plasma Glucose: 122.63 mg/dL

## 2018-08-28 SURGERY — IR WITH ANESTHESIA
Anesthesia: General

## 2018-08-28 MED ORDER — VITAL AF 1.2 CAL PO LIQD
1000.0000 mL | ORAL | Status: DC
  Administered 2018-08-28 – 2018-09-02 (×6): 1000 mL
  Filled 2018-08-28 (×3): qty 1000

## 2018-08-28 MED ORDER — PROPOFOL 10 MG/ML IV BOLUS
INTRAVENOUS | Status: DC | PRN
  Administered 2018-08-28: 150 mg via INTRAVENOUS

## 2018-08-28 MED ORDER — ACETAMINOPHEN 650 MG RE SUPP
650.0000 mg | RECTAL | Status: DC | PRN
  Filled 2018-08-28: qty 1

## 2018-08-28 MED ORDER — CHLORHEXIDINE GLUCONATE 0.12% ORAL RINSE (MEDLINE KIT)
15.0000 mL | Freq: Two times a day (BID) | OROMUCOSAL | Status: DC
  Administered 2018-08-28 – 2018-09-05 (×17): 15 mL via OROMUCOSAL

## 2018-08-28 MED ORDER — PRO-STAT SUGAR FREE PO LIQD
30.0000 mL | Freq: Two times a day (BID) | ORAL | Status: DC
  Administered 2018-08-28 – 2018-09-02 (×11): 30 mL
  Filled 2018-08-28 (×10): qty 30

## 2018-08-28 MED ORDER — SODIUM CHLORIDE 0.9 % IV SOLN
INTRAVENOUS | Status: DC | PRN
  Administered 2018-08-28: 03:00:00 via INTRAVENOUS

## 2018-08-28 MED ORDER — FENTANYL CITRATE (PF) 100 MCG/2ML IJ SOLN
INTRAMUSCULAR | Status: AC
  Filled 2018-08-28: qty 2

## 2018-08-28 MED ORDER — PROPOFOL 1000 MG/100ML IV EMUL
5.0000 ug/kg/min | INTRAVENOUS | Status: DC
  Administered 2018-08-28: 10 ug/kg/min via INTRAVENOUS
  Administered 2018-08-28 – 2018-08-29 (×4): 20 ug/kg/min via INTRAVENOUS
  Administered 2018-08-29: 25 ug/kg/min via INTRAVENOUS
  Administered 2018-08-30: 7.5 ug/kg/min via INTRAVENOUS
  Administered 2018-08-30: 20 ug/kg/min via INTRAVENOUS
  Administered 2018-08-31: 7.5 ug/kg/min via INTRAVENOUS
  Administered 2018-09-01: 25 ug/kg/min via INTRAVENOUS
  Administered 2018-09-01 – 2018-09-02 (×2): 10 ug/kg/min via INTRAVENOUS
  Filled 2018-08-28 (×13): qty 100

## 2018-08-28 MED ORDER — TIROFIBAN HCL IN NACL 5-0.9 MG/100ML-% IV SOLN
INTRAVENOUS | Status: AC
  Filled 2018-08-28: qty 100

## 2018-08-28 MED ORDER — CLEVIDIPINE BUTYRATE 0.5 MG/ML IV EMUL
0.0000 mg/h | INTRAVENOUS | Status: AC
  Administered 2018-08-28: 1 mg/h via INTRAVENOUS
  Administered 2018-08-28: 2 mg/h via INTRAVENOUS
  Administered 2018-08-29: 4 mg/h via INTRAVENOUS
  Filled 2018-08-28 (×4): qty 50

## 2018-08-28 MED ORDER — EPTIFIBATIDE 20 MG/10ML IV SOLN
INTRAVENOUS | Status: AC
  Filled 2018-08-28: qty 10

## 2018-08-28 MED ORDER — SUGAMMADEX SODIUM 200 MG/2ML IV SOLN
INTRAVENOUS | Status: DC | PRN
  Administered 2018-08-28: 200 mg via INTRAVENOUS

## 2018-08-28 MED ORDER — ONDANSETRON HCL 4 MG/2ML IJ SOLN
4.0000 mg | Freq: Four times a day (QID) | INTRAMUSCULAR | Status: DC | PRN
  Administered 2018-09-02: 4 mg via INTRAVENOUS
  Filled 2018-08-28 (×2): qty 2

## 2018-08-28 MED ORDER — ONDANSETRON HCL 4 MG/2ML IJ SOLN
INTRAMUSCULAR | Status: DC | PRN
  Administered 2018-08-28: 4 mg via INTRAVENOUS

## 2018-08-28 MED ORDER — STROKE: EARLY STAGES OF RECOVERY BOOK
Freq: Once | Status: AC
  Administered 2018-08-28: 08:00:00
  Filled 2018-08-28: qty 1

## 2018-08-28 MED ORDER — LIDOCAINE HCL (CARDIAC) PF 100 MG/5ML IV SOSY
PREFILLED_SYRINGE | INTRAVENOUS | Status: DC | PRN
  Administered 2018-08-28: 60 mg via INTRATRACHEAL

## 2018-08-28 MED ORDER — ORAL CARE MOUTH RINSE
15.0000 mL | OROMUCOSAL | Status: DC
  Administered 2018-08-28 – 2018-09-05 (×71): 15 mL via OROMUCOSAL

## 2018-08-28 MED ORDER — TICAGRELOR 90 MG PO TABS
ORAL_TABLET | ORAL | Status: AC
  Filled 2018-08-28: qty 2

## 2018-08-28 MED ORDER — CEFAZOLIN SODIUM-DEXTROSE 2-3 GM-%(50ML) IV SOLR
INTRAVENOUS | Status: DC | PRN
  Administered 2018-08-28: 2 g via INTRAVENOUS

## 2018-08-28 MED ORDER — ACETAMINOPHEN 160 MG/5ML PO SOLN
650.0000 mg | ORAL | Status: DC | PRN
  Administered 2018-09-02 – 2018-09-04 (×2): 650 mg
  Filled 2018-08-28 (×4): qty 20.3

## 2018-08-28 MED ORDER — EPHEDRINE SULFATE 50 MG/ML IJ SOLN
INTRAMUSCULAR | Status: DC | PRN
  Administered 2018-08-28: 20 mg via INTRAVENOUS
  Administered 2018-08-28 (×2): 15 mg via INTRAVENOUS
  Administered 2018-08-28 (×2): 5 mg via INTRAVENOUS
  Administered 2018-08-28: 15 mg via INTRAVENOUS
  Administered 2018-08-28: 10 mg via INTRAVENOUS

## 2018-08-28 MED ORDER — SODIUM CHLORIDE 0.9 % IV SOLN
INTRAVENOUS | Status: DC
  Administered 2018-08-28 – 2018-09-02 (×7): via INTRAVENOUS

## 2018-08-28 MED ORDER — CEFAZOLIN SODIUM-DEXTROSE 2-4 GM/100ML-% IV SOLN
INTRAVENOUS | Status: AC
  Filled 2018-08-28: qty 100

## 2018-08-28 MED ORDER — SODIUM POLYSTYRENE SULFONATE 15 GM/60ML PO SUSP
15.0000 g | Freq: Once | ORAL | Status: AC
  Administered 2018-08-28: 15 g via ORAL
  Filled 2018-08-28: qty 60

## 2018-08-28 MED ORDER — IOPAMIDOL (ISOVUE-370) INJECTION 76%
50.0000 mL | Freq: Once | INTRAVENOUS | Status: AC | PRN
  Administered 2018-08-28: 50 mL via INTRAVENOUS

## 2018-08-28 MED ORDER — SODIUM CHLORIDE 0.9 % IV SOLN
INTRAVENOUS | Status: DC | PRN
  Administered 2018-08-28: 25 ug/min via INTRAVENOUS

## 2018-08-28 MED ORDER — IOHEXOL 300 MG/ML  SOLN: 54.0000 mL | Freq: Once | INTRAMUSCULAR | Status: DC | PRN

## 2018-08-28 MED ORDER — CARVEDILOL 6.25 MG PO TABS
6.2500 mg | ORAL_TABLET | Freq: Two times a day (BID) | ORAL | Status: DC
  Administered 2018-08-28 – 2018-09-05 (×12): 6.25 mg
  Filled 2018-08-28 (×8): qty 1
  Filled 2018-08-28: qty 2
  Filled 2018-08-28 (×3): qty 1

## 2018-08-28 MED ORDER — FENTANYL CITRATE (PF) 100 MCG/2ML IJ SOLN
INTRAMUSCULAR | Status: DC | PRN
  Administered 2018-08-28: 100 ug via INTRAVENOUS

## 2018-08-28 MED ORDER — PHENYLEPHRINE HCL 10 MG/ML IJ SOLN
INTRAMUSCULAR | Status: DC | PRN
  Administered 2018-08-28: 40 ug via INTRAVENOUS
  Administered 2018-08-28: 80 ug via INTRAVENOUS

## 2018-08-28 MED ORDER — PANTOPRAZOLE SODIUM 40 MG PO PACK
40.0000 mg | PACK | Freq: Every day | ORAL | Status: DC
  Administered 2018-08-28 – 2018-09-05 (×8): 40 mg
  Filled 2018-08-28 (×8): qty 20

## 2018-08-28 MED ORDER — SUCCINYLCHOLINE 20MG/ML (10ML) SYRINGE FOR MEDFUSION PUMP - OPTIME
INTRAMUSCULAR | Status: DC | PRN
  Administered 2018-08-28: 120 mg via INTRAVENOUS

## 2018-08-28 MED ORDER — ACETAMINOPHEN 325 MG PO TABS
650.0000 mg | ORAL_TABLET | ORAL | Status: DC | PRN
  Filled 2018-08-28: qty 2

## 2018-08-28 MED ORDER — ROCURONIUM 10MG/ML (10ML) SYRINGE FOR MEDFUSION PUMP - OPTIME
INTRAVENOUS | Status: DC | PRN
  Administered 2018-08-28: 50 mg via INTRAVENOUS

## 2018-08-28 MED ORDER — ASPIRIN 325 MG PO TABS
ORAL_TABLET | ORAL | Status: AC
  Filled 2018-08-28: qty 1

## 2018-08-28 MED ORDER — CLOPIDOGREL BISULFATE 300 MG PO TABS
ORAL_TABLET | ORAL | Status: AC
  Filled 2018-08-28: qty 1

## 2018-08-28 MED ORDER — NITROGLYCERIN 1 MG/10 ML FOR IR/CATH LAB
INTRA_ARTERIAL | Status: AC
  Administered 2018-08-28: 25 ug via INTRA_ARTERIAL
  Filled 2018-08-28: qty 10

## 2018-08-28 MED FILL — Medication: Qty: 1 | Status: AC

## 2018-08-28 NOTE — Anesthesia Procedure Notes (Signed)
Procedure Name: Intubation Date/Time: 08/28/2018 2:58 AM Performed by: Valetta Fuller, CRNA Pre-anesthesia Checklist: Patient identified, Emergency Drugs available, Suction available and Patient being monitored Patient Re-evaluated:Patient Re-evaluated prior to induction Oxygen Delivery Method: Circle system utilized Preoxygenation: Pre-oxygenation with 100% oxygen Induction Type: IV induction, Rapid sequence and Cricoid Pressure applied Laryngoscope Size: Miller and 2 Grade View: Grade I Tube type: Oral Tube size: 7.5 mm Number of attempts: 1 Airway Equipment and Method: Stylet Placement Confirmation: ETT inserted through vocal cords under direct vision,  positive ETCO2 and breath sounds checked- equal and bilateral Secured at: 23 cm Tube secured with: Tape Dental Injury: Teeth and Oropharynx as per pre-operative assessment  Comments: Dentures removed and placed in denture cup. With Everlena Cooper, RN

## 2018-08-28 NOTE — Progress Notes (Signed)
Patient was transported to MRI and back without incident

## 2018-08-28 NOTE — Procedures (Signed)
S/P RT common carotid arteriogram followed by complete revascularization of occluded distal RT MCA M1 and co dominant inf division withx 1 pass with 46mm x 40mm embotrap retriever device achieving a TICI 3 revascularization

## 2018-08-28 NOTE — Progress Notes (Signed)
STROKE TEAM PROGRESS NOTE   HISTORY OF PRESENT ILLNESS (per record) Left-sided weakness Clayton [name] is a 67 y.o. male who was admitted to [facility] on 12/9 with shortness of breath, increasing over the past 2 weeks. He developed gastroenteritis with diarrhea.  He had elevated troponins which were felt to be due to demand ischemia.  His AKI has resolved with a creatinine of 0.89 today.  He was felt to be excluded from IV TPA due to PT of 15.9 and was outside the IV TPA window but his time of arrival to [facility].  At baseline, he lives alone and takes care of all of his own activities of daily living does not need any help even with bills or other things.  He denies current shortness of breath or chest pain.  LKW: 7 PM tpa given?: no, elevated prothrombin time Modified Rankin scale: 2   SUBJECTIVE (INTERVAL HISTORY) The patient remains intubated. Two family members at the bedside.The patient is unresponsive and does not follow commands. MRI planned for today. Dr Leonie Man spoke at length to family members.    OBJECTIVE Vitals:   08/28/18 1158 08/28/18 1200 08/28/18 1300 08/28/18 1317  BP:  117/71 122/73   Pulse:  (!) 59 61   Resp:  15 16   Temp:  (!) 96.4 F (35.8 C) (!) 96.8 F (36 C)   TempSrc:      SpO2: 97% 95% 94% 95%  Weight:      Height:        CBC:  Recent Labs  Lab 08/24/18 1137  08/27/18 0403 08/28/18 0559 08/28/18 1002  WBC 5.7   < > 8.2  --  5.9  NEUTROABS 4.1  --   --   --  5.0  HGB 12.5*   < > 12.2* 13.6 11.3*  HCT 41.1   < > 40.8 40.0 38.9*  MCV 94.3   < > 98.3  --  95.3  PLT 213   < > 193  --  190   < > = values in this interval not displayed.    Basic Metabolic Panel:  Recent Labs  Lab 08/27/18 1136 08/28/18 0559 08/28/18 1002  NA 133* 138 137  K 5.7* 5.6* 5.5*  CL 103  --  104  CO2 24  --  25  GLUCOSE 217*  --  138*  BUN 38*  --  28*  CREATININE 0.89  --  0.70  CALCIUM 8.9  --  9.0  MG  --   --  1.5*  PHOS  --   --  3.1     Lipid Panel:     Component Value Date/Time   CHOL 112 08/28/2018 1002   TRIG 77 08/28/2018 1133   HDL 32 (L) 08/28/2018 1002   CHOLHDL 3.5 08/28/2018 1002   VLDL 15 08/28/2018 1002   LDLCALC 65 08/28/2018 1002   HgbA1c:  Lab Results  Component Value Date   HGBA1C 5.9 (H) 08/28/2018   Urine Drug Screen: No results found for: LABOPIA, COCAINSCRNUR, LABBENZ, AMPHETMU, THCU, LABBARB  Alcohol Level No results found for: ETH  IMAGING   Ct Angio Head W Or Wo Contrast Ct Angio Neck W Or Wo Contrast Ct Cerebral Perfusion W Contrast 08/28/2018 IMPRESSION:  1. Right M2 inferior division origin occlusion and nonocclusive thrombus of distal right M1. Right M2 thrombus approximately 13 mm in length. Good downstream collateralization.  2. Negative CT brain perfusion for acute stroke by perfusion criteria. Mild asymmetry of transit  time, increased in the right posterior cerebral hemisphere, likely related to M2 thrombus.  3. Patent carotid and vertebral arteries of the neck. No dissection, aneurysm, or significant stenosis by NASCET criteria.  4. No additional intracranial large vessel occlusion, aneurysm, or vascular malformation.  5. Mild right vertebral artery origin stenosis with calcified plaque.  6. Mild right proximal ICA stenosis with calcified plaque.  7. Mild right and moderate left cavernous and paraclinoid ICA stenosis with calcified plaque.    Ct Cerebral Perfusion W Contrast 08/28/2018 IMPRESSION:  1. Right lentiform nucleus infarction, ASPECTS 9 on noncontrast CT of head. Pseudo normalized perfusion associated with this area visible infarction.  2. No additional region of acute infarction by perfusion criteria identified.  3. Mildly increased transit time in the right posterior cerebral hemisphere likely related collateralization of right M2 inferior division occlusion.    Dg Chest Portable 1 View 08/28/2018 IMPRESSION:  *Unremarkable hardware positioning.  *Cardiomegaly and atelectasis.     Dg Abd Portable 1v 08/28/2018 IMPRESSION:  1. Orogastric tube noted with tip over distal stomach. No evidence of gastric distention.  2.  Bibasilar atelectasis/infiltrates.    Ct Head Code Stroke Wo Contrast 08/28/2018 IMPRESSION:  1. Hypoattenuation within the right lentiform nucleus spanning up to approximately 3.4 cm (5 cc) likely representing an acute or subacute infarction. No associated hemorrhage or mass effect. Increased conspicuity from prior CT of head.  2. ASPECTS is 9    Ct Head Code Stroke Wo Contrast` 08/27/2018 IMPRESSION:  1. No acute hemorrhage.  2. ASPECTS is 10.     Cerebral Angiogram S/P RT common carotid arteriogram followed by complete revascularization of occluded distal RT MCA M1 and co dominant inf division withx 1 pass with 32mm x 81mm embotrap retriever device achieving a TICI 3 revascularization   Transthoracic Echocardiogram  08/25/2018 Study Conclusions - Left ventricle: The cavity size was normal. There was mild   concentric hypertrophy. Systolic function was normal. The   estimated ejection fraction was in the range of 50% to 55%. There   was septal-lateral dyssynchrony. Doppler parameters are   consistent with abnormal left ventricular relaxation (grade 1   diastolic dysfunction). - Ventricular septum: Septal motion showed abnormal function and   dyssynergy. These changes are consistent with intraventricular   conduction delay. - Aortic valve: Trileaflet; mildly thickened, moderately calcified   leaflets. Morphologically, there appears to be mild calcific   aortic stenosis. - Mitral valve: Mildly calcified annulus. - Right ventricle: The cavity size was mildly dilated. Systolic   function was moderately reduced. - Right atrium: The atrium was mildly dilated. - Tricuspid valve: There was moderate regurgitation. - Pulmonary arteries: PA peak pressure: 57 mm Hg (S). - Inferior vena cava: The vessel was  dilated. The respirophasic   diameter changes were blunted (< 50%), consistent with elevated   central venous pressure. Estimated CVP 15 mmHg.      PHYSICAL EXAM Blood pressure 122/73, pulse 61, temperature (!) 96.8 F (36 C), resp. rate 16, height 6' (1.829 m), weight 95.5 kg, SpO2 95 %. Elderly middle-aged African-American male who is intubated and sedated. . Afebrile. Head is nontraumatic. Neck is supple without bruit.    Cardiac exam no murmur or gallop. Lungs are clear to auscultation. Distal pulses are well felt. Neurological Exam ; Patient is sedated and intubated. Eyes open partially to sternal rub. Eyes are in primary position. Pupils 4 mm sluggishly reactive. Doll's eye movements are present. Fundi were not visualized. Left lower facial weakness. Tongue  midline. Motor system exam he moves right upper and lower extremities spontaneously and against gravity. On the trace withdrawal in the left upper extremity. He does move the left lower extremity to painful stimuli. Tone is diminished on the left compared to the right. Right plantar downgoing left is equivocal.  ASSESSMENT/PLAN [name] is a 67 y.o. male with history of coronary artery disease previous MI / stenting, COPD, diabetes mellitus, gastroesophageal reflux disease, hypertension, hyperlipidemia, prostate cancer, and peripheral vascular disease presenting with left-sided weakness. He did not receive IV t-PA due to elevated protime.  Stroke: Rt MCA infarct - embolic - source unknown  Resultant  Left hemiplegia  CT head - Hypoattenuation within the right lentiform nucleus likely representing an acute or subacute infarction.  MRI head - pending  MRA head  - not ordered  CTA H&N - Right M2 inferior division origin occlusion and nonocclusive thrombus of distal right M1. Right M2 thrombus approximately 13 mm in length.   Carotid Doppler - CTA neck performed - carotid dopplers not indicated.  2D Echo  - EF 50 - 55%.  No cardiac source of emboli identified.   LDL - 65  HgbA1c - 5.9  UDS - not performed  VTE prophylaxis - [place] Heparin  Diet - NPO  No antithrombotic prior to admission, now on aspirin 81 mg daily  Patient will be counseled to be compliant with his antithrombotic medications  Ongoing aggressive stroke risk factor management  Therapy recommendations:  pending  Disposition:  Pending  Hypertension  Stable . Permissive hypertension (OK if < 220/120) but gradually normalize in 5-7 days . Long-term BP goal normotensive  Hyperlipidemia  Lipid lowering medication PTA:  none  LDL 65, goal < 70  Current lipid lowering medication: none - will need statin therapy  Continue statin at discharge  Diabetes  HgbA1c 5.9, goal < 7.0  Controlled  Other Stroke Risk Factors  Advanced age  Cigarette smoker - will be advised to stop smoking  ETOH use, advised to drink no more than 1 alcoholic beverage per day.  Obesity, Body mass index is 28.55 kg/m., recommend weight loss, diet and exercise as appropriate   Coronary artery disease  Cerebral Angiogram  S/P RT common carotid arteriogram followed by complete revascularization of occluded distal RT MCA M1 and co dominant inf division withx 1 pass with 53mm x 2mm embotrap retriever device achieving a TICI 3 revascularization   Other Active Problems  Hyperkalemia - 5.5 -> Kayexalate per [facility] day # [place] PA-C [facility] Pager [phone] 08/28/2018, 1:41 PM  I have personally obtained history,examined this patient, reviewed notes, independently viewed imaging studies, participated in medical decision making and plan of care.ROS completed by me personally and pertinent positives fully documented  I have made any additions or clarifications directly to the above note. Agree with note above. Presented with right MCA infarct due to occlusion of the right M2 and underwent successful  mechanical thrombectomy with complete recanalization. Recommend close neurological monitoring and strict blood pressure control as per post intervention protocol. Check MRI scan of the brain dated today. Consider extubation tomorrow. Continue ongoing stroke workup and check echocardiogram,urine drug screen and hemoglobin A1c. May need to consider TEE and prolonged cardiac monitoring later. Long discussion of the bedside with patient's family member and answered questions about his care. Discussed with Dr. Elsworth Soho. This patient is critically ill and at significant risk of neurological worsening, death and  care requires constant monitoring of vital signs, hemodynamics,respiratory and cardiac monitoring, extensive review of multiple databases, frequent neurological assessment, discussion with family, other specialists and medical decision making of high complexity.I have made any additions or clarifications directly to the above note.This critical care time does not reflect procedure time, or teaching time or supervisory time of PA/NP/Med Resident etc but could involve care discussion time.  I spent 35 minutes of neurocritical care time  in the care of  this patient.     Antony Contras, MD Medical Director [facility] Pager: [phone] 08/28/2018 5:23 PM  To contact Stroke Continuity provider, please refer to [email or web address] After hours, contact General Neurology

## 2018-08-28 NOTE — Anesthesia Postprocedure Evaluation (Deleted)
Anesthesia Post Note  Patient: Clayton [name]  Procedure(s) Performed: IR WITH ANESTHESIA (N/A )     Patient location during evaluation: PACU Anesthesia Type: General Level of consciousness: awake and alert Pain management: pain level controlled Vital Signs Assessment: post-procedure vital signs reviewed and stable Respiratory status: spontaneous breathing, nonlabored ventilation, respiratory function stable and patient connected to nasal cannula oxygen Cardiovascular status: blood pressure returned to baseline and stable Postop Assessment: no apparent nausea or vomiting Anesthetic complications: no    Last Vitals:  Vitals:   08/28/18 0500 08/28/18 0530  BP: 101/69 113/69  Pulse: 68 68  Resp:  13  Temp: (!) 36.1 C   SpO2: (!) 88% 94%    Last Pain:  Vitals:   08/28/18 0051  TempSrc: Oral  PainSc:                  Progress S

## 2018-08-28 NOTE — Anesthesia Postprocedure Evaluation (Signed)
Anesthesia Post Note  Patient: Clayton [name]  Procedure(s) Performed: IR WITH ANESTHESIA (N/A )     Patient location during evaluation: PACU Anesthesia Type: General Level of consciousness: lethargic, confused and patient uncooperative Pain management: pain level controlled Vital Signs Assessment: post-procedure vital signs reviewed and stable Respiratory status: patient re-intubated and spontaneous breathing (ABG in PACU revealed large respiratory acidosis) Cardiovascular status: stable and blood pressure returned to baseline Postop Assessment: no apparent nausea or vomiting Anesthetic complications: no Comments: Pt was not very responsive in PACU.  ABG revealed large respiratory acidosis and pt was reintubated without difficulty.  Propofol 120 mg, Sux 100 mg.  Brief hypotension treated with phenylephrine 120 mcg IV.      Last Vitals:  Vitals:   08/28/18 0600 08/28/18 0619  BP: 111/67   Pulse: (!) 54   Resp: 15   Temp:    SpO2: 97% 98%    Last Pain:  Vitals:   08/28/18 0051  TempSrc: Oral  PainSc:                  [name]

## 2018-08-28 NOTE — Progress Notes (Signed)
Gave report to [facility]. Patient is to go to [place] ER per code stroke.

## 2018-08-28 NOTE — Progress Notes (Signed)
[facility] left with patient at 0118. Called [facility] ER and gave report

## 2018-08-28 NOTE — Progress Notes (Signed)
SLP Cancellation Note  Patient Details Name: Clayton [name] MRN: [id number] DOB: Dec 05, 1950   Cancelled treatment:       Reason Eval/Treat Not Completed: Patient not medically ready. Intubated. Will follow for readiness.    Sharni Negron, Katherene Ponto 08/28/2018, 7:51 AM

## 2018-08-28 NOTE — Addendum Note (Signed)
Addendum  created 08/28/18 4196 by Albertha Ghee, MD   Clinical Note Signed, Delete clinical note

## 2018-08-28 NOTE — Transfer of Care (Signed)
Immediate Anesthesia Transfer of Care Note  Patient: Clayton [name]  Procedure(s) Performed: IR WITH ANESTHESIA (N/A )  Patient Location: PACU  Anesthesia Type:General  Level of Consciousness: responds to stimulation  Airway & Oxygen Therapy: non-rebreather face mask  Post-op Assessment: Report given to RN and Post -op Vital signs reviewed and stable  Post vital signs: Reviewed  Last Vitals:  Vitals Value Taken Time  BP 103/74 08/28/2018  4:57 AM  Temp    Pulse    Resp 16 08/28/2018  4:58 AM  SpO2    Vitals shown include unvalidated device data.  Last Pain:  Vitals:   08/28/18 0051  TempSrc: Oral  PainSc:          Complications: No apparent anesthesia complications

## 2018-08-28 NOTE — Progress Notes (Signed)
[place] ICU called to get report on patient. Gave ICU report.

## 2018-08-28 NOTE — Progress Notes (Signed)
MRI scan of the brain results which show small patchy right MCA infarct with right basal ganglia small hemorrhage. Recommend hold aspirin. Repeat CT scan in 24-48 hours to decide on starting antiplatelet agent.

## 2018-08-28 NOTE — Anesthesia Preprocedure Evaluation (Signed)
Anesthesia Evaluation  Patient identified by MRN, date of birth, ID band Patient confused  Preop documentation limited or incomplete due to emergent nature of procedure.  Airway Mallampati: II   Neck ROM: full    Dental   Pulmonary COPD, Current Smoker,    breath sounds clear to auscultation       Cardiovascular hypertension, + CAD, + Past MI, + Cardiac Stents and + Peripheral Vascular Disease   Rhythm:regular Rate:Normal     Neuro/Psych Pt received in IR under code stroke protocol CVA    GI/Hepatic GERD  ,  Endo/Other  diabetes, Type 2  Renal/GU      Musculoskeletal   Abdominal   Peds  Hematology   Anesthesia Other Findings   Reproductive/Obstetrics                             Anesthesia Physical Anesthesia Plan  ASA: III and emergent  Anesthesia Plan: General   Post-op Pain Management:    Induction: Intravenous  PONV Risk Score and Plan: 1 and Ondansetron, Dexamethasone and Treatment may vary due to age or medical condition  Airway Management Planned: Oral ETT  Additional Equipment: Arterial line  Intra-op Plan:   Post-operative Plan: Possible Post-op intubation/ventilation  Informed Consent: I have reviewed the patients History and Physical, chart, labs and discussed the procedure including the risks, benefits and alternatives for the proposed anesthesia with the patient or authorized representative who has indicated his/her understanding and acceptance.     Plan Discussed with: CRNA, Anesthesiologist and Surgeon  Anesthesia Plan Comments:         Anesthesia Quick Evaluation

## 2018-08-28 NOTE — Progress Notes (Signed)
Patient ID: Clayton [name], male   DOB: 18-Nov-1950, 67 y.o.   MRN: [id number] INR 67Y M RH  Premorbid MRSS 0. LSW 700pm. CT no ICH ASPECTS 9 CTA occluded distal RT MCA M1 and co dominant Inf division.NIH 16. CTP  ?no core or penumbra? Option of endovascular revascularization of RT MCA D/W sister. Reasons,risks,alternatives reviewed. Risks of ICH,10%,worsening neuro deficit,vent dependency,death inability to revascularize and vascular injury reviewed.Sister understands and wishes to proceed.Informed witnessed consent obtained.Marland Kitchen S.Tremaine Earwood MD

## 2018-08-28 NOTE — Progress Notes (Signed)
Patient ID: Clayton [name], male   DOB: [birth year], 67 y.o.   MRN: [id number] INR . Ost CT Brain No gross ICH or mass effect.Extubated. Slow in regaining consciousness. Maintaining O2 > 95 %. BP 75/50 gradually improving. RT groin soft. Distal pulses dopplerable DPs and PTs. S.Perez Dirico MD

## 2018-08-28 NOTE — Progress Notes (Signed)
Initial Nutrition Assessment  DOCUMENTATION CODES:   Not applicable  INTERVENTION:   Initiate Vital AF 1.2 @ 50 ml/hr (1200 ml/day) via OG tube 30 ml Prostat BID  Provides: 1640 kcal, 120 grams protein, and 973 ml free water.   TF regimen and propofol at current rate providing 1942 total kcal/day   NUTRITION DIAGNOSIS:   Increased nutrient needs related to chronic illness(COPD) as evidenced by estimated needs.  GOAL:   Patient will meet greater than or equal to 90% of their needs  MONITOR:   TF tolerance, Vent status, I & O's  REASON FOR ASSESSMENT:   Consult, Ventilator Enteral/tube feeding initiation and management  ASSESSMENT:   Pt with PMH of COPD on home O2, CAD, DM,  admitted 12/9 with SOB after several days of N/V. On 12/12 pt with status change and CTA showed R MCA s/p complete revascularization by IR 12/13.    Pt discussed during ICU rounds and with RN.  Pt to have MRI today, if remains intubated will start TF.  Noted muscle depletions, unable to obtain nutrition hx at this time. Pt with Bear hugger on.   Patient remains intubated on ventilator support MV: 9.5 L/min Temp (24hrs), Avg:95.9 F (35.5 C), Min:94.6 F (34.8 C), Max:98.5 F (36.9 C)  Propofol: 11.46 ml/hr provides: 302 kcal  Cleviprex: 2 ml/hr: provides: 96 kcal - per RN, plan to turn off Medications reviewed and include: SSI TID with meals and bedtime, 8 units novolog TID with meals, lantus 25 units every morning Labs reviewed: Na 133 (L)    NUTRITION - FOCUSED PHYSICAL EXAM:    Most Recent Value  Orbital Region  No depletion  Upper Arm Region  Moderate depletion  Thoracic and Lumbar Region  No depletion  Buccal Region  Unable to assess  Temple Region  Moderate depletion  Clavicle Bone Region  Moderate depletion  Clavicle and Acromion Bone Region  Moderate depletion  Scapular Bone Region  Unable to assess  Dorsal Hand  Unable to assess  Patellar Region  Mild depletion  Anterior  Thigh Region  Mild depletion  Posterior Calf Region  Mild depletion  Edema (RD Assessment)  Mild  Hair  Unable to assess  Eyes  Unable to assess  Mouth  Unable to assess  Skin  Reviewed  Nails  Unable to assess       Diet Order:   Diet Order            Diet NPO time specified  Diet effective now              EDUCATION NEEDS:   No education needs have been identified at this time  Skin:  Skin Assessment: (Venous stasis ulcer RLE)  Last BM:  12/12  Height:   Ht Readings from Last 1 Encounters:  08/25/18 6' (1.829 m)    Weight:   Wt Readings from Last 1 Encounters:  08/27/18 95.5 kg    Ideal Body Weight:  (P) 80.9 kg  BMI:  Body mass index is 28.55 kg/m.  Estimated Nutritional Needs:   Kcal:  1925  Protein:  115-130 grams  Fluid:  >1.9 L/day  Maylon Peppers RD, LDN, CNSC [phone] Pager [phone] After Hours Pager

## 2018-08-28 NOTE — Progress Notes (Signed)
67 year old male who was found to have left-sided facial droop and left-sided weakness, code stroke was called, CT head stat showed no acute infarct.  Tele neurology was consulted, initially TPA was planned.  After PT/PTT was obtained patient was found not to be candidate for TPA due to elevated PT/INR 15.9/1.28 as per neurology. CTA head and neck and cerebral perfusion scan was obtained, which showed thrombus of distal right M1 and right M2 thrombus approximately 13 mm in length.  Good downstream collateralization  Neurologist recommended to transfer to [facility] stat  I called and discussed with Dr. Leonel Ramsay, neurologist on-call at [facility] He is aware of patient condition, and has arranged for transfer to [facility], ED. He wants to evaluate patient at [facility], ED before making further recommendation. Patient will be transferred to [facility], ED, [facility] is aware.

## 2018-08-28 NOTE — Progress Notes (Signed)
Paged Dr. Leonel Ramsay about pt not responding to verbal commands and not being able to due a NIH scale. Orders where giving to get a blood gas and head CT. This RN will carry out orders.

## 2018-08-28 NOTE — Progress Notes (Signed)
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  Medications: Prior to Admission medications   Medication Sig Start Date End Date Taking? Authorizing Provider  albuterol (PROVENTIL HFA;VENTOLIN HFA) 108 (90 BASE) MCG/ACT inhaler Inhale 2 puffs into the lungs every 6 (six) hours as needed for wheezing or shortness of breath. Please instruct in proper technique. 06/29/15  Yes Samuella Cota, MD  amLODipine (NORVASC) 5 MG tablet Take 5 mg by mouth daily.   Yes [provider]  carvedilol (COREG) 6.25 MG tablet Take 1 tablet by mouth 2 (two) times daily. 01/06/16  Yes [provider]  Fluticasone-Salmeterol (ADVAIR DISKUS) 250-50 MCG/DOSE AEPB Inhale 1 puff into the lungs 2 (two) times daily. 05/17/17 08/24/18 Yes Orvan Falconer, MD  glipiZIDE (GLUCOTROL) 10 MG tablet Take 10 mg by mouth 2 (two) times daily.   Yes [provider]  metFORMIN (GLUCOPHAGE) 1000 MG tablet Take 1 tablet by mouth 2 (two) times daily. 05/14/15  Yes [provider]  ondansetron (ZOFRAN) 4 MG tablet Take 4 mg by mouth every 8 (eight) hours as needed for nausea or vomiting.   Yes [provider]  sildenafil (REVATIO) 20 MG tablet Take 2-5 tablets by mouth daily as needed (intercourse).  01/08/16   [provider]     Vital Signs: BP 126/78   Pulse (!) 56   Temp (!) 95 F (35 C)   Resp 16   Ht 6' (1.829 m)   Wt 210 lb 8.6 oz (95.5 kg)   SpO2 (!) 88%   BMI 28.55 kg/m   Physical  Exam Vitals signs reviewed.  Pulmonary:     Comments: vent Musculoskeletal:     Comments: No response  Skin:    General: Skin is warm and dry.     Comments: Right groin no bleeding Soft No hematoma Rt foot 1+ pulses     Imaging: Ct Angio Head W Or Wo Contrast  Result Date: 08/28/2018 CLINICAL DATA:  67 y/o M; code stroke, left facial droop and altered mental status. EXAM: CT ANGIOGRAPHY HEAD AND NECK CT PERFUSION BRAIN TECHNIQUE: Multidetector CT imaging of the head and neck was performed using the standard protocol during bolus administration of intravenous contrast. Multiplanar CT image reconstructions and MIPs were obtained to evaluate the vascular anatomy. Carotid stenosis measurements (when applicable) are obtained utilizing NASCET criteria, using the distal internal carotid diameter as the denominator. Multiphase CT imaging of the brain was performed following IV bolus contrast injection. Subsequent parametric perfusion maps were calculated using RAPID software. CONTRAST:  118mL ISOVUE-370 IOPAMIDOL (ISOVUE-370) INJECTION 76% COMPARISON:  08/27/2018 CT head. FINDINGS: CTA NECK FINDINGS Aortic arch: Standard branching. Imaged portion shows no evidence of aneurysm or dissection. No significant stenosis of the major arch vessel origins. Moderate mixed plaque of the arch. Right carotid system: No evidence of dissection, stenosis (50% or greater) or occlusion. Right carotid bifurcation calcified plaque with mild less than 50% proximal ICA stenosis. Left carotid system: No evidence of dissection, stenosis (50% or greater) or occlusion. Non stenotic calcified plaque of  the carotid bifurcation. Vertebral arteries: Calcified plaque of right vertebral artery origin with mild less than 50% stenosis. Left dominant vertebral arteries. No additional segment of significant stenosis, dissection, or aneurysm. Skeleton: Moderate spondylosis of the cervical spine with degenerative changes greatest at the  C5-C7 levels. No high-grade bony canal stenosis. Other neck: Negative. Upper chest: Small bilateral pleural effusions. Moderate to severe emphysema. Review of the MIP images confirms the above findings CTA HEAD FINDINGS Anterior circulation: Right M2 inferior division proximal occlusion with good reconstitution of the vessel in distal collateralization. Right M2 inferior division thrombus approximately 13 mm in length (series 9, image 25). Nonocclusive clot appears to extend into the distal right M1. The right M2 superior division is widely patent. Calcific atherosclerosis of carotid siphons with mild right and moderate left distal cavernous and paraclinoid stenosis. Posterior circulation: No significant stenosis, proximal occlusion, aneurysm, or vascular malformation. Venous sinuses: As permitted by contrast timing, patent. Anatomic variants: None significant. Review of the MIP images confirms the above findings CT Brain Perfusion Findings: CBF (<30%) Volume: 44mL Perfusion (Tmax>6.0s) volume: 79mL, right parietal white matter. Mismatch Volume: 27mL Infarction Location:No acute stroke by perfusion criteria. IMPRESSION: 1. Right M2 inferior division origin occlusion and nonocclusive thrombus of distal right M1. Right M2 thrombus approximately 13 mm in length. Good downstream collateralization. 2. Negative CT brain perfusion for acute stroke by perfusion criteria. Mild asymmetry of transit time, increased in the right posterior cerebral hemisphere, likely related to M2 thrombus. 3. Patent carotid and vertebral arteries of the neck. No dissection, aneurysm, or significant stenosis by NASCET criteria. 4. No additional intracranial large vessel occlusion, aneurysm, or vascular malformation. 5. Mild right vertebral artery origin stenosis with calcified plaque. 6. Mild right proximal ICA stenosis with calcified plaque. 7. Mild right and moderate left cavernous and paraclinoid ICA stenosis with calcified plaque. These results  were called by telephone at the time of interpretation on 08/28/2018 at 12:01 am to Dr. Darrick Meigs , who verbally acknowledged these results. Electronically Signed   By: Kristine Garbe M.D.   On: 08/28/2018 00:07   Ct Head Wo Contrast  Result Date: 08/24/2018 CLINICAL DATA:  Altered level of consciousness. EXAM: CT HEAD WITHOUT CONTRAST TECHNIQUE: Contiguous axial images were obtained from the base of the skull through the vertex without intravenous contrast. COMPARISON:  None. FINDINGS: Brain: Mild to moderate atrophy. Hypodensity in the cerebral white matter bilaterally most likely chronic. Negative for acute infarct, hemorrhage, or mass. Vascular: Atherosclerotic calcification without acute arterial thrombosis. Skull: Negative Sinuses/Orbits: Paranasal sinuses clear. Bilateral cataract surgery. Other: None IMPRESSION: Atrophy and chronic microvascular ischemic changes in the white matter. No acute abnormality. Electronically Signed   By: Franchot Gallo M.D.   On: 08/24/2018 12:54   Ct Angio Neck W Or Wo Contrast  Result Date: 08/28/2018 CLINICAL DATA:  67 y/o M; code stroke, left facial droop and altered mental status. EXAM: CT ANGIOGRAPHY HEAD AND NECK CT PERFUSION BRAIN TECHNIQUE: Multidetector CT imaging of the head and neck was performed using the standard protocol during bolus administration of intravenous contrast. Multiplanar CT image reconstructions and MIPs were obtained to evaluate the vascular anatomy. Carotid stenosis measurements (when applicable) are obtained utilizing NASCET criteria, using the distal internal carotid diameter as the denominator. Multiphase CT imaging of the brain was performed following IV bolus contrast injection. Subsequent parametric perfusion maps were calculated using RAPID software. CONTRAST:  116mL ISOVUE-370 IOPAMIDOL (ISOVUE-370) INJECTION 76% COMPARISON:  08/27/2018 CT head. FINDINGS: CTA NECK FINDINGS Aortic arch: Standard  branching. Imaged portion shows no  evidence of aneurysm or dissection. No significant stenosis of the major arch vessel origins. Moderate mixed plaque of the arch. Right carotid system: No evidence of dissection, stenosis (50% or greater) or occlusion. Right carotid bifurcation calcified plaque with mild less than 50% proximal ICA stenosis. Left carotid system: No evidence of dissection, stenosis (50% or greater) or occlusion. Non stenotic calcified plaque of the carotid bifurcation. Vertebral arteries: Calcified plaque of right vertebral artery origin with mild less than 50% stenosis. Left dominant vertebral arteries. No additional segment of significant stenosis, dissection, or aneurysm. Skeleton: Moderate spondylosis of the cervical spine with degenerative changes greatest at the C5-C7 levels. No high-grade bony canal stenosis. Other neck: Negative. Upper chest: Small bilateral pleural effusions. Moderate to severe emphysema. Review of the MIP images confirms the above findings CTA HEAD FINDINGS Anterior circulation: Right M2 inferior division proximal occlusion with good reconstitution of the vessel in distal collateralization. Right M2 inferior division thrombus approximately 13 mm in length (series 9, image 25). Nonocclusive clot appears to extend into the distal right M1. The right M2 superior division is widely patent. Calcific atherosclerosis of carotid siphons with mild right and moderate left distal cavernous and paraclinoid stenosis. Posterior circulation: No significant stenosis, proximal occlusion, aneurysm, or vascular malformation. Venous sinuses: As permitted by contrast timing, patent. Anatomic variants: None significant. Review of the MIP images confirms the above findings CT Brain Perfusion Findings: CBF (<30%) Volume: 22mL Perfusion (Tmax>6.0s) volume: 46mL, right parietal white matter. Mismatch Volume: 15mL Infarction Location:No acute stroke by perfusion criteria. IMPRESSION: 1. Right M2 inferior division origin occlusion and  nonocclusive thrombus of distal right M1. Right M2 thrombus approximately 13 mm in length. Good downstream collateralization. 2. Negative CT brain perfusion for acute stroke by perfusion criteria. Mild asymmetry of transit time, increased in the right posterior cerebral hemisphere, likely related to M2 thrombus. 3. Patent carotid and vertebral arteries of the neck. No dissection, aneurysm, or significant stenosis by NASCET criteria. 4. No additional intracranial large vessel occlusion, aneurysm, or vascular malformation. 5. Mild right vertebral artery origin stenosis with calcified plaque. 6. Mild right proximal ICA stenosis with calcified plaque. 7. Mild right and moderate left cavernous and paraclinoid ICA stenosis with calcified plaque. These results were called by telephone at the time of interpretation on 08/28/2018 at 12:01 am to Dr. Darrick Meigs , who verbally acknowledged these results. Electronically Signed   By: Kristine Garbe M.D.   On: 08/28/2018 00:07   US Abdomen Complete  Result Date: 08/25/2018 CLINICAL DATA:  Elevated liver function studies. Acute renal injury. History of prostate malignancy and diabetes. EXAM: ABDOMEN ULTRASOUND COMPLETE COMPARISON:  None. FINDINGS: Gallbladder: The gallbladder is adequately distended. No stones or sludge are observed. There is borderline thickening of the gallbladder wall at 4 mm but no pericholecystic fluid or positive sonographic Murphy's sign. Common bile duct: Diameter: 3 mm Liver: No focal lesion identified. Within normal limits in parenchymal echogenicity. There is a small amount of perihepatic fluid. Portal vein is patent on color Doppler imaging with normal direction of blood flow towards the liver. IVC: No abnormality visualized. Pancreas: The pancreas was obscured by bowel gas. Spleen: Size and appearance within normal limits. Right Kidney: Length: 12.9 cm. The renal cortical echotexture is increased and is greater than that of the liver. There is  a septated cyst in the mid to lower pole cortex measuring 2.1 x 1.9 x 1.8 cm Left Kidney: Length: 13.4 cm. The renal cortical echotexture on  the left is mildly increased similar to that on the right. There are 3 cystic structures in the left kidney. The largest contains a septation and lies in the lower pole and measures 3.3 x 3.2 x 3.4 cm. Two additional cysts are present which are simple cystic in nature. There is no hydronephrosis. Abdominal aorta: Bowel gas obscures the mid and distal aorta and bifurcation. There is a small right pleural effusion. Other findings: None. IMPRESSION: Increased renal cortical echotexture consistent with medical renal disease. No hydronephrosis. Septated cysts in both kidneys. Normal appearance of the liver. Small amount of perihepatic ascites. Small right pleural effusion. Nonvisualization of the pancreas due to bowel gas. Electronically Signed   By: David  Martinique M.D.   On: 08/25/2018 12:15   Ct Cerebral Perfusion W Contrast  Result Date: 08/28/2018 CLINICAL DATA:  67 y/o  M; code stroke for follow-up. EXAM: CT PERFUSION BRAIN TECHNIQUE: Multiphase CT imaging of the brain was performed following IV bolus contrast injection. Subsequent parametric perfusion maps were calculated using RAPID software. CONTRAST:  40 cc Isovue 370 COMPARISON:  08/27/2018 CTA head, CT head, CT perfusion head. 08/28/2018 CT head. FINDINGS: CT Brain Perfusion Findings: CBF (<30%) Volume: 67mL Perfusion (Tmax>6.0s) volume: 80mL Mismatch Volume: 81mL ASPECTS on noncontrast CT Head: 9 at 151 hours today. Infarct Core: Approximately 5 cc mL Infarction Location:Right lentiform nucleus. IMPRESSION: 1. Right lentiform nucleus infarction, ASPECTS 9 on noncontrast CT of head. Pseudo normalized perfusion associated with this area visible infarction. 2. No additional region of acute infarction by perfusion criteria identified. 3. Mildly increased transit time in the right posterior cerebral hemisphere likely related  collateralization of right M2 inferior division occlusion. Electronically Signed   By: Kristine Garbe M.D.   On: 08/28/2018 02:21   Ct Cerebral Perfusion W Contrast  Result Date: 08/28/2018 CLINICAL DATA:  67 y/o M; code stroke, left facial droop and altered mental status. EXAM: CT ANGIOGRAPHY HEAD AND NECK CT PERFUSION BRAIN TECHNIQUE: Multidetector CT imaging of the head and neck was performed using the standard protocol during bolus administration of intravenous contrast. Multiplanar CT image reconstructions and MIPs were obtained to evaluate the vascular anatomy. Carotid stenosis measurements (when applicable) are obtained utilizing NASCET criteria, using the distal internal carotid diameter as the denominator. Multiphase CT imaging of the brain was performed following IV bolus contrast injection. Subsequent parametric perfusion maps were calculated using RAPID software. CONTRAST:  115mL ISOVUE-370 IOPAMIDOL (ISOVUE-370) INJECTION 76% COMPARISON:  08/27/2018 CT head. FINDINGS: CTA NECK FINDINGS Aortic arch: Standard branching. Imaged portion shows no evidence of aneurysm or dissection. No significant stenosis of the major arch vessel origins. Moderate mixed plaque of the arch. Right carotid system: No evidence of dissection, stenosis (50% or greater) or occlusion. Right carotid bifurcation calcified plaque with mild less than 50% proximal ICA stenosis. Left carotid system: No evidence of dissection, stenosis (50% or greater) or occlusion. Non stenotic calcified plaque of the carotid bifurcation. Vertebral arteries: Calcified plaque of right vertebral artery origin with mild less than 50% stenosis. Left dominant vertebral arteries. No additional segment of significant stenosis, dissection, or aneurysm. Skeleton: Moderate spondylosis of the cervical spine with degenerative changes greatest at the C5-C7 levels. No high-grade bony canal stenosis. Other neck: Negative. Upper chest: Small bilateral  pleural effusions. Moderate to severe emphysema. Review of the MIP images confirms the above findings CTA HEAD FINDINGS Anterior circulation: Right M2 inferior division proximal occlusion with good reconstitution of the vessel in distal collateralization. Right M2 inferior division thrombus approximately  13 mm in length (series 9, image 25). Nonocclusive clot appears to extend into the distal right M1. The right M2 superior division is widely patent. Calcific atherosclerosis of carotid siphons with mild right and moderate left distal cavernous and paraclinoid stenosis. Posterior circulation: No significant stenosis, proximal occlusion, aneurysm, or vascular malformation. Venous sinuses: As permitted by contrast timing, patent. Anatomic variants: None significant. Review of the MIP images confirms the above findings CT Brain Perfusion Findings: CBF (<30%) Volume: 1mL Perfusion (Tmax>6.0s) volume: 34mL, right parietal white matter. Mismatch Volume: 61mL Infarction Location:No acute stroke by perfusion criteria. IMPRESSION: 1. Right M2 inferior division origin occlusion and nonocclusive thrombus of distal right M1. Right M2 thrombus approximately 13 mm in length. Good downstream collateralization. 2. Negative CT brain perfusion for acute stroke by perfusion criteria. Mild asymmetry of transit time, increased in the right posterior cerebral hemisphere, likely related to M2 thrombus. 3. Patent carotid and vertebral arteries of the neck. No dissection, aneurysm, or significant stenosis by NASCET criteria. 4. No additional intracranial large vessel occlusion, aneurysm, or vascular malformation. 5. Mild right vertebral artery origin stenosis with calcified plaque. 6. Mild right proximal ICA stenosis with calcified plaque. 7. Mild right and moderate left cavernous and paraclinoid ICA stenosis with calcified plaque. These results were called by telephone at the time of interpretation on 08/28/2018 at 12:01 am to Dr. Darrick Meigs , who  verbally acknowledged these results. Electronically Signed   By: Kristine Garbe M.D.   On: 08/28/2018 00:07   Dg Chest Portable 1 View  Result Date: 08/28/2018 CLINICAL DATA:  Intubation EXAM: PORTABLE CHEST 1 VIEW COMPARISON:  08/24/2018 FINDINGS: Endotracheal tube tip between the clavicular heads and carina. An orogastric tube reaches the stomach. Cardiomegaly accentuated by low volumes. Streaky opacity at the bases and generalized interstitial coarsening. No Kerley lines, large effusion, or pneumothorax. IMPRESSION: *Unremarkable hardware positioning. *Cardiomegaly and atelectasis. Electronically Signed   By: Monte Fantasia M.D.   On: 08/28/2018 07:17   Dg Knee Complete 4 Views Right  Result Date: 08/24/2018 CLINICAL DATA:  Right knee pain following an unwitnessed fall. EXAM: RIGHT KNEE - COMPLETE 4+ VIEW COMPARISON:  None. FINDINGS: The bones are subjectively adequately mineralized. There's mild narrowing of the medial joint compartment. There's mild beaking of the tibial spines. There's no acute or healing fracture. A small suprapatellar effusion is suspected. IMPRESSION: There's no acute fracture nor dislocation of the right knee. Mild degenerative narrowing of the medial compartment is present. Electronically Signed   By: David  Martinique M.D.   On: 08/24/2018 12:23   Dg Abd Acute W/chest  Result Date: 08/24/2018 CLINICAL DATA:  One week of shortness of breath. The face was found on the floor this morning and complains of right knee and left lower ribcage pain secondary to a fall. EXAM: DG ABDOMEN ACUTE W/ 1V CHEST COMPARISON:  Chest x-ray of May 13, 2017 FINDINGS: The lungs are well-expanded. The interstitial markings are chronically increased. The heart is mildly enlarged but stable. The pulmonary vascularity is not clearly engorged. The lateral left lower ribs are excluded from the field of view. Within the abdomen the bowel gas pattern is within the limits of normal. The stool  burden is moderate. The observed bony structures of the abdomen and pelvis exhibit no acute abnormalities. IMPRESSION: Chronic bronchitic changes, stable. No alveolar pneumonia nor pulmonary edema. No pneumothorax nor large pleural effusion. The the mid and lower left ribs cannot be completely assessed due to patient positioning. Within the abdomen no acute  abnormality is observed. The observed bony structures exhibit no acute abnormalities. Electronically Signed   By: David  Martinique M.D.   On: 08/24/2018 12:21   Dg Abd Portable 1v  Result Date: 08/28/2018 CLINICAL DATA:  Orogastric tube placement. EXAM: PORTABLE ABDOMEN - 1 VIEW COMPARISON:  08/24/2018. FINDINGS: Orogastric tube noted with tip over the distal stomach. No bowel distention. No free air. Bibasilar atelectasis/infiltrates. IMPRESSION: 1. Orogastric tube noted with tip over distal stomach. No evidence of gastric distention. 2.  Bibasilar atelectasis/infiltrates. Electronically Signed   By: Marcello Moores  Register   On: 08/28/2018 07:18   Ct Head Code Stroke Wo Contrast  Result Date: 08/28/2018 CLINICAL DATA:  Code stroke. EXAM: CT HEAD WITHOUT CONTRAST TECHNIQUE: Contiguous axial images were obtained from the base of the skull through the vertex without intravenous contrast. COMPARISON:  08/27/2018 CT head and CTA head. FINDINGS: Brain: Area of hypoattenuation spanning approximately 3.4 x 1.0 x 2.8 cm (volume = 5 cm^3) centered within the right lentiform nucleus (series 3, image 17 and series 5, image 35) likely representing an area of acute or subacute infarction. No additional region of stroke or hemorrhage is identified at this time. Stable background of chronic microvascular ischemic changes and volume loss of the brain. Vascular: Calcific atherosclerosis of the carotid siphons. Skull: Normal. Negative for fracture or focal lesion. Sinuses/Orbits: No acute finding. Other: None. ASPECTS [facility] Stroke Program Early CT Score) - Ganglionic level  infarction (caudate, lentiform nuclei, internal capsule, insula, M1-M3 cortex): 6 - Supraganglionic infarction (M4-M6 cortex): 3 Total score (0-10 with 10 being normal): 9 IMPRESSION: 1. Hypoattenuation within the right lentiform nucleus spanning up to approximately 3.4 cm (5 cc) likely representing an acute or subacute infarction. No associated hemorrhage or mass effect. Increased conspicuity from prior CT of head. 2. ASPECTS is 9 These results were called by telephone at the time of interpretation on 08/28/2018 at 2:02 am to Dr. Roland Rack , who verbally acknowledged these results. Electronically Signed   By: Kristine Garbe M.D.   On: 08/28/2018 02:07   Ct Head Code Stroke Wo Contrast`  Result Date: 08/27/2018 CLINICAL DATA:  Code stroke. Left facial droop and altered mental status EXAM: CT HEAD WITHOUT CONTRAST TECHNIQUE: Contiguous axial images were obtained from the base of the skull through the vertex without intravenous contrast. COMPARISON:  None. FINDINGS: Brain: There is no mass, hemorrhage or extra-axial collection. The size and configuration of the ventricles and extra-axial CSF spaces are normal. There is hypoattenuation of the periventricular white matter, most commonly indicating chronic ischemic microangiopathy. Vascular: No abnormal hyperdensity of the major intracranial arteries or dural venous sinuses. No intracranial atherosclerosis. Skull: The visualized skull base, calvarium and extracranial soft tissues are normal. Sinuses/Orbits: No fluid levels or advanced mucosal thickening of the visualized paranasal sinuses. No mastoid or middle ear effusion. The orbits are normal. ASPECTS [facility] Stroke Program Early CT Score) - Ganglionic level infarction (caudate, lentiform nuclei, internal capsule, insula, M1-M3 cortex): 7 - Supraganglionic infarction (M4-M6 cortex): 3 Total score (0-10 with 10 being normal): 10 IMPRESSION: 1. No acute hemorrhage. 2. ASPECTS is 10. These  results were called by telephone at the time of interpretation on 08/27/2018 at 9:34 pm to Dr. Eleonore Chiquito , who verbally acknowledged these results. Electronically Signed   By: Ulyses Jarred M.D.   On: 08/27/2018 21:32    Labs:  CBC: Recent Labs    08/24/18 1137  08/25/18 0407 08/26/18 0409 08/27/18 0403 08/28/18 0559  WBC 5.7  --  5.0 7.6 8.2  --   HGB 12.5*   < > 11.4* 12.0* 12.2* 13.6  HCT 41.1   < > 37.8* 39.1 40.8 40.0  PLT 213  --  178 213 193  --    < > = values in this interval not displayed.    COAGS: Recent Labs    08/24/18 1644 08/27/18 2111  INR  --  1.28  APTT 169* 33    BMP: Recent Labs    08/25/18 0407 08/26/18 0409 08/27/18 0403 08/27/18 1136 08/28/18 0559  NA 136 136 136 133* 138  K 5.6* 5.4* 5.9* 5.7* 5.6*  CL 103 105 105 103  --   CO2 24 24 25 24   --   GLUCOSE 176* 202* 220* 217*  --   BUN 59* 45* 39* 38*  --   CALCIUM 8.3* 8.9 9.0 8.9  --   CREATININE 2.37* 1.06 0.88 0.89  --   GFRNONAA 28* >60 >60 >60  --   GFRAA 32* >60 >60 >60  --     LIVER FUNCTION TESTS: Recent Labs    08/24/18 1137 08/25/18 0407 08/26/18 0409 08/27/18 0403  BILITOT 3.1* 2.3* 2.1* 1.8*  AST 484* 281* 208* 190*  ALT 349* 308* 351* 375*  ALKPHOS 141* 116 116 105  PROT 8.7* 7.3 7.7 7.2  ALBUMIN 4.1 3.4* 3.5 3.4*    Assessment and Plan:  CVA Rt MCA Revascularization Will follow  Electronically Signed: Lavonia Drafts, PA-C 08/28/2018, 10:41 AM   I spent a total of 15 Minutes at the the patient's bedside AND on the patient's hospital floor or unit, greater than 50% of which was counseling/coordinating care for CVA

## 2018-08-28 NOTE — Anesthesia Procedure Notes (Signed)
Arterial Line Insertion Start/End12/13/2019 2:40 AM, 08/28/2018 2:45 AM Performed by: Valetta Fuller, CRNA, CRNA  Patient location: OR. Lidocaine 1% used for infiltration Left, radial was placed Catheter size: 20 G  Attempts: 1 Procedure performed without using ultrasound guided technique. Following insertion, dressing applied and Biopatch. Post procedure assessment: normal  Patient tolerated the procedure well with no immediate complications.

## 2018-08-28 NOTE — Consult Note (Signed)
NAME:  Clayton [name], MRN:  [id number], DOB:  1950/11/25, LOS: 4 ADMISSION DATE:  08/24/2018, CONSULTATION DATE:  08/28/2018 REFERRING MD:  Dr. Leonel Ramsay, CHIEF COMPLAINT:  Stroke  Brief History   67 yoM originally admitted to [facility] on 12/9 with AECOPD, AKI, dehydration s/p N/V for several days found with acute left sided deficits.  Code stroke initiated.  Found to have acute Right M2 occlusion.  TPA not given.  Transferred emergently to [facility] and taken to Neuro IR.  PCCM consulted for vent management.  History of present illness   HPI obtained from medical chart review as patient is currently intubated and sedated on mechanical ventilation.  67 year old male with history of tobacco abuse, COPD, chronic hypoxic respiratory failure on home O2 2Ls, HTN, HLD, DM, CAD w/prior MI, GERD, and prostate CA initially admitted to [facility] on 12/9 after being found on floor.  Patient complained of progressive SOB x 2 weeks, 1 week history of nausea, vomiting, and diarrhea with poor PO intake, dizziness, and generalized weakness.    Admitted for AECOPD, hypoxia, dehydration, AKI, elevated LFTs, and troponin.  Initial CXR and head CT negative.  Evaluated by Cardiology and empirically treated with heparin until 12/10 after TTE did not show any wall motion abnormality and elevation in troponin thought due to demand ischemia.  He has been treated with steroids, nebs, azithro and HFNC weaned down to 5L [place] as of yesterday. He has been afebrile and normotensive. Negative GI workup thus far (-stool, -RUQ [place], -hepatitis panel).  AKI resolved after IV hydration and transaminitis is improving.   LSW around shift change 1900 on 12/12.  After 2030, found to have left facial droop, aphasia, and left sided weakness.  Code stroke activated and assessed by tele Neuro.  NIHSS 16. CT head negative.  Decision made not to give TPA due to elevated PT/INR 15.9/ 1.28.  CTA head/ neck noted for acute right MCA occlusion.    Patient was emergently transferred to [facility] and taken to neuro IR for embolectomy.  Intubated for procedure and will return to ICU on ventilator.  Extubation deferred given ongoing AECOPD.  Therefore, PCCM consulted for further medical and ventilator management in ICU.  Past Medical History   tobacco abuse, COPD, chronic hypoxic respiratory failure on home O2 2Ls, HTN, HLD, DM, CAD w/prior MI, GERD, and prostate [place] [facility] Events   12/9 Admit at [facility] 12/13 tx to [facility]  Consults:   Procedures:  12/13 cerebral angiogram  12/13 ETT >>  Significant Diagnostic Tests:  12/9 [facility]  >> neg 12/10 TTE >> EF 50-55%, G1DD, mild AS, mod TR, PAP 57 mmHg 12/12 CTH >> neg; ASPECTS 10  12/12 CTA head/neck >> 1. Right M2 inferior division origin occlusion and nonocclusive thrombus of distal right M1. Right M2 thrombus approximately 13 mm in length. Good downstream collateralization. 2. Negative CT brain perfusion for acute stroke by perfusion criteria. Mild asymmetry of transit time, increased in the right posterior cerebral hemisphere, likely related to M2 thrombus. 3. Patent carotid and vertebral arteries of the neck. No dissection, aneurysm, or significant stenosis by NASCET criteria. 4. No additional intracranial large vessel occlusion, aneurysm, or vascular malformation. 5. Mild right vertebral artery origin stenosis with calcified plaque. 6. Mild right proximal ICA stenosis with calcified plaque. 7. Mild right and moderate left cavernous and paraclinoid ICA stenosis with calcified plaque.  12/13 CTH >> 1. Hypoattenuation within the right lentiform nucleus spanning up to approximately 3.4  cm (5 cc) likely representing an acute or subacute infarction. No associated hemorrhage or mass effect. Increased conspicuity from prior CT of head. 2. ASPECTS is 9  12/13 CT cerebral perfusion >>  1. Right lentiform nucleus infarction, ASPECTS 9 on noncontrast CT of head. Pseudo normalized  perfusion associated with this area visible infarction. 2. No additional region of acute infarction by perfusion criteria identified. 3. Mildly increased transit time in the right posterior cerebral hemisphere likely related collateralization of right M2 inferior division occlusion.  Micro Data:  12/10 MRSA PCR  >> neg 12/10 Cdiff  >> meg 12/10 GI panel >> neg  Antimicrobials:  12/9 azithro >> 12/13 ancef pre op  Interim history/subjective:    Objective   Blood pressure (!) 155/79, pulse 63, temperature 97.7 F (36.5 C), temperature source Oral, resp. rate 20, height 6' (1.829 m), weight 95.5 kg, SpO2 100 %.        Intake/Output Summary (Last 24 hours) at 08/28/2018 0256 Last data filed at 08/28/2018 0056 Gross per 24 hour  Intake 360 ml  Output 1650 ml  Net -1290 ml   Filed Weights   08/26/18 0500 08/27/18 0427 08/27/18 2200  Weight: 94.3 kg 95 kg 95.5 kg    Examination: General: Chronically ill appearing male, NAD, sedated on the vent HENT: [place]/AT, PERRL, EOM-none and MMM Lungs: CTA bilaterally Cardiovascular: RRR, Nl S1/S2 and -M/R/G Abdomen: Soft, NT, ND and +BS Extremities: -edema and -tenderness Neuro: Sedated and paralyzed, unable to test Skin: intact  Resolved Hospital Problem list    Assessment & Plan:  Right MCA stroke s/p EVR and complete revascularization, was extubated in the PACU post op but mental status was poor, unable to protect his airway and was reintubated.  Acute respiratory failure with hypoxemia:  - Full vent support  - Titrate O2 for sat of 88-92%  - Increase PEEP to 12  - Goal is to FiO2 of 50 and PEEP of 8 by the end of the day  COPD exacerbation:  - Solumedrol 40 q6  - Duonebs  - PRN albuterol  HTN:   - Cleveprix   - Propofol  - Target per neurology  DM:  - ISS  - CBG  - Start TF   Hyperkalemia:  - Kayexalate 15 PO x1 dose  - BMET in AM  - Replace electrolytes as indicated  GI: start TF  Labs   CBC: Recent Labs   Lab 08/24/18 1137 08/24/18 1149 08/25/18 0407 08/26/18 0409 08/27/18 0403  WBC 5.7  --  5.0 7.6 8.2  NEUTROABS 4.1  --   --   --   --   HGB 12.5* 14.6 11.4* 12.0* 12.2*  HCT 41.1 43.0 37.8* 39.1 40.8  MCV 94.3  --  94.5 95.8 98.3  PLT 213  --  178 213 196    Basic Metabolic Panel: Recent Labs  Lab 08/24/18 1149 08/24/18 1408 08/25/18 0407 08/26/18 0409 08/27/18 0403 08/27/18 1136  NA 136  --  136 136 136 133*  K 5.9* 5.5* 5.6* 5.4* 5.9* 5.7*  CL 100  --  103 105 105 103  CO2  --   --  24 24 25 24   GLUCOSE 78  --  176* 202* 220* 217*  BUN 55*  --  59* 45* 39* 38*  CREATININE 4.20*  --  2.37* 1.06 0.88 0.89  CALCIUM  --   --  8.3* 8.9 9.0 8.9   GFR: Estimated Creatinine Clearance: 97.9 mL/min (by C-G formula based  on SCr of 0.89 mg/dL). Recent Labs  Lab 08/24/18 1137 08/24/18 1145 08/24/18 1258 08/25/18 0407 08/26/18 0409 08/27/18 0403  WBC 5.7  --   --  5.0 7.6 8.2  LATICACIDVEN  --  2.42* 2.58*  --   --   --     Liver Function Tests: Recent Labs  Lab 08/24/18 1137 08/25/18 0407 08/26/18 0409 08/27/18 0403  AST 484* 281* 208* 190*  ALT 349* 308* 351* 375*  ALKPHOS 141* 116 116 105  BILITOT 3.1* 2.3* 2.1* 1.8*  PROT 8.7* 7.3 7.7 7.2  ALBUMIN 4.1 3.4* 3.5 3.4*   Recent Labs  Lab 08/24/18 1137  LIPASE 23   No results for input(s): AMMONIA in the last 168 hours.  ABG    Component Value Date/Time   HCO3 30.5 (H) 11/18/2007 2011   TCO2 32 08/24/2018 1149     Coagulation Profile: Recent Labs  Lab 08/27/18 2111  INR 1.28    Cardiac Enzymes: Recent Labs  Lab 08/24/18 1146 08/24/18 1644 08/24/18 2214 08/25/18 0407  CKTOTAL 574*  --   --   --   TROPONINI  --  1.24* 0.81* 0.59*    HbA1C: Hgb A1c MFr Bld  Date/Time Value Ref Range Status  05/13/2017 05:10 PM 6.4 (H) 4.8 - 5.6 % Final    Comment:    (NOTE) Pre diabetes:          5.7%-6.4% Diabetes:              >6.4% Glycemic control for   <7.0% adults with diabetes    11/20/2007 04:05 AM (H)  Final   6.6 (NOTE)   The ADA recommends the following therapeutic goals for glycemic   control related to Hgb A1C measurement:   Goal of Therapy:   < 7.0% Hgb A1C   Action Suggested:  > 8.0% Hgb A1C   Ref:  Diabetes Care, 22, Suppl. 1, 1999    CBG: Recent Labs  Lab 08/27/18 0738 08/27/18 1111 08/27/18 1613 08/27/18 2041 08/28/18 0116  GLUCAP 200* 205* 231* 190* 113*    Review of Systems:   Unattainable, patient is sedated, paralyzed and intubated  Past Medical History  He,  has a past medical history of CAD (coronary artery disease), COPD (chronic obstructive pulmonary disease) ([place]), Diabetes mellitus, GERD (gastroesophageal reflux disease), Guaiac positive stools (07/03/2018), HTN (hypertension), Hyperlipidemia, Myocardial infarction ([place]) (2009), Prostate cancer ([place]), and PVD (peripheral vascular disease) ([place]).   Surgical History    Past Surgical History:  Procedure Laterality Date  . CARDIAC CATHETERIZATION     with stent  . cardiac stents  2009  . CATARACT EXTRACTION W/PHACO Left 08/19/2013   Procedure: CATARACT EXTRACTION PHACO AND INTRAOCULAR LENS PLACEMENT (IOC);  Surgeon: Tonny Branch, MD;  Location: [facility];  Service: Ophthalmology;  Laterality: Left;  CDE:37.77  . CATARACT EXTRACTION W/PHACO Right 07/29/2016   Procedure: CATARACT EXTRACTION PHACO AND INTRAOCULAR LENS PLACEMENT RIGHT EYE; CDE:  18.10;  Surgeon: Tonny Branch, MD;  Location: [facility];  Service: Ophthalmology;  Laterality: Right;     Social History   reports that he has been smoking cigarettes. He started smoking about 49 years ago. He has a 42.00 pack-year smoking history. He has never used smokeless tobacco. He reports current alcohol use. He reports that he does not use drugs.   Family History   His family history includes Coronary artery disease in his father and mother; Prostate cancer in his father.   Allergies No Known Allergies  Home Medications  Prior to Admission  medications   Medication Sig Start Date End Date Taking? Authorizing Provider  albuterol (PROVENTIL HFA;VENTOLIN HFA) 108 (90 BASE) MCG/ACT inhaler Inhale 2 puffs into the lungs every 6 (six) hours as needed for wheezing or shortness of breath. Please instruct in proper technique. 06/29/15  Yes Samuella Cota, MD  amLODipine (NORVASC) 5 MG tablet Take 5 mg by mouth daily.   Yes [provider]  carvedilol (COREG) 6.25 MG tablet Take 1 tablet by mouth 2 (two) times daily. 01/06/16  Yes [provider]  Fluticasone-Salmeterol (ADVAIR DISKUS) 250-50 MCG/DOSE AEPB Inhale 1 puff into the lungs 2 (two) times daily. 05/17/17 08/24/18 Yes Orvan Falconer, MD  glipiZIDE (GLUCOTROL) 10 MG tablet Take 10 mg by mouth 2 (two) times daily.   Yes [provider]  metFORMIN (GLUCOPHAGE) 1000 MG tablet Take 1 tablet by mouth 2 (two) times daily. 05/14/15  Yes [provider]  ondansetron (ZOFRAN) 4 MG tablet Take 4 mg by mouth every 8 (eight) hours as needed for nausea or vomiting.   Yes [provider]  sildenafil (REVATIO) 20 MG tablet Take 2-5 tablets by mouth daily as needed (intercourse).  01/08/16   [provider]    The patient is critically ill with multiple organ systems failure and requires high complexity decision making for assessment and support, frequent evaluation and titration of therapies, application of advanced monitoring technologies and extensive interpretation of multiple databases.   Critical Care Time devoted to patient care services described in this note is  37  Minutes. This time reflects time of care of this signee Dr [name]. This critical care time does not reflect procedure time, or teaching time or supervisory time of PA/NP/Med student/Med Resident etc but could involve care discussion time.  Rush Farmer, M.D. [facility] Pulmonary/Critical Care Medicine. Pager: [phone]. After hours pager: [phone].

## 2018-08-28 NOTE — Progress Notes (Addendum)
CTA 1. Right M2 inferior division origin occlusion and nonocclusive thrombus of distal right M1. Right M2 thrombus approximately 13 mm in length. Good downstream collateralization.  Mismatch 67ml (? Quality)  Transfer recommended for consideration of NIR STAT.  Dr. Darrick Meigs aware

## 2018-08-28 NOTE — Progress Notes (Signed)
PT Cancellation Note  Patient Details Name: Clayton [name] MRN: [id number] DOB: [birth year]   Cancelled Treatment:    Reason Eval/Treat Not Completed: Active bedrest order   Sandy Salaam Avel Ogawa 08/28/2018, 9:35 AM Elwyn Reach, PT Acute Rehabilitation Services Pager: [phone] Office: [phone]

## 2018-08-28 NOTE — Progress Notes (Addendum)
Upon arrival to PACU, patient's sO2 82% on 6L simple mask.  Bilateral breath sounds rhoncus and diminished.  Patient placed on non-rebreather with sO2 93-95%.  Patient not responding, not following commands.  Dr. Leonel Ramsay notified and orders rec'd for ABG and head CT.  ABG showed resp acidosis pH 7.20, pCO2 68.2, pO2 82, HCO3 27.  Dr Annye Asa and Dr. Leonel Ramsay notified of results.  Decision made to intubate in PACU and cancel head CT.  Patient intubated successfully and transferred to 8O41 without complication.  Patient's sister updated.

## 2018-08-28 NOTE — H&P (Signed)
Neurology H&P  CC: Left-sided weakness  History is obtained from: Patient, chart  HPI: Clayton [name] is a 67 y.o. male who was admitted to [facility] on 12/9 with shortness of breath, increasing over the past 2 weeks.  He developed gastroenteritis with diarrhea.  He had elevated troponins which were felt to be due to demand ischemia.  His AKI has resolved with a creatinine of 0.89 today.  He was felt to be excluded from IV TPA due to PT of 15.9 and was outside the IV TPA window but his time of arrival to [facility].  At baseline, he lives alone and takes care of all of his own activities of daily living does not need any help even with bills or other things.  He denies current shortness of breath or chest pain.  LKW: 7 PM tpa given?: no, elevated prothrombin time Modified Rankin scale: 2  ROS: A 14 point ROS was performed and is negative except as noted in the HPI.   Past Medical History:  Diagnosis Date  . CAD (coronary artery disease)    a. s/p BMS x 3 to RCA in 2009  . COPD (chronic obstructive pulmonary disease) ([place])   . Diabetes mellitus   . GERD (gastroesophageal reflux disease)   . Guaiac positive stools 07/03/2018  . HTN (hypertension)   . Hyperlipidemia   . Myocardial infarction ([place]) 2009  . Prostate cancer ([place])   . PVD (peripheral vascular disease) ([facility])      Family History  Problem Relation Age of Onset  . Coronary artery disease Mother   . Coronary artery disease Father   . Prostate cancer Father      Social History:  reports that he has been smoking cigarettes. He started smoking about 49 years ago. He has a 42.00 pack-year smoking history. He has never used smokeless tobacco. He reports current alcohol use. He reports that he does not use drugs.   Exam: Current vital signs: BP (!) 155/79   Pulse 63   Temp 97.7 F (36.5 C) (Oral)   Resp 20   Ht 6' (1.829 m)   Wt 95.5 kg   SpO2 100%   BMI 28.55 kg/m  Vital signs in last 24 hours: Temp:   [97.3 F (36.3 C)-98.6 F (37 C)] 97.7 F (36.5 C) (12/13 0051) Pulse Rate:  [60-75] 63 (12/13 0111) Resp:  [16-20] 20 (12/13 0051) BP: (124-170)/(73-91) 155/79 (12/13 0111) SpO2:  [89 %-100 %] 100 % (12/13 0111) Weight:  [95 kg-95.5 kg] 95.5 kg (12/12 2200)  Physical Exam  Constitutional: Appears well-developed and well-nourished.  Psych: Affect appropriate to situation Eyes: No scleral injection HENT: No OP obstrucion, oxygen in place Head: Normocephalic.  Cardiovascular: Normal rate and regular rhythm.  Respiratory: Effort normal, nonlabored breathing GI: Soft.  No distension. There is no tenderness.  Skin: WDI  Neuro: Mental Status: Patient is awake, alert, oriented to person, place, month, year, and situation. He has a waxing and waning neglect at times able to identify stimulus to the left side and at other times identifying all stimulus to the left side is being on the right Cranial Nerves: II: Waxing and waning left hemianopia pupils are equal, round, and reactive to light.   III,IV, VI: Varies between a right gaze preference and a right gaze forced deviation V: He has decreased sensation left face  VII: Facial movement with left facial weakness Motor: Varies between severe weakness and 4-/5 in the left arm, 4/5 in the left  leg, 5/5 in both the right arm and leg. 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  There is a suggestion of oligemia on the right in the setting of a right M2 occlusion with symptoms corresponding to this.  Given that there is no large area of infarct on CT perfusion and aspects of 9, I feel that mechanical thrombectomy is warranted.  The lack of clear penumbra based on cerebral blood flow is unusual, but I do not think it should preclude intervention based on his clinical symptoms and other imaging.  Recommendations: - HgbA1c, fasting lipid panel - MRI  of the brain without contrast - Frequent neuro checks - Prophylactic therapy-Antiplatelet med: Aspirin - dose 325mg  PO or 300mg  PR - Risk factor modification - Telemetry monitoring - PT consult, OT consult, Speech consult - Stroke team to follow   This patient is critically ill and at significant risk of neurological worsening, death and care requires constant monitoring of vital signs, hemodynamics,respiratory and cardiac monitoring, neurological assessment, discussion with family, other specialists and medical decision making of high complexity. I spent 60 minutes of neurocritical care time  in the care of  this patient.  Roland Rack, MD [facility] [phone]  If 7pm- 7am, please page neurology on call as listed in [place]. 08/28/2018  2:48 AM

## 2018-08-29 ENCOUNTER — Inpatient Hospital Stay (HOSPITAL_COMMUNITY): Payer: Medicare Other

## 2018-08-29 ENCOUNTER — Encounter (HOSPITAL_COMMUNITY): Payer: Medicare Other

## 2018-08-29 ENCOUNTER — Encounter (HOSPITAL_COMMUNITY): Payer: Self-pay | Admitting: Interventional Radiology

## 2018-08-29 DIAGNOSIS — J9621 Acute and chronic respiratory failure with hypoxia: Secondary | ICD-10-CM

## 2018-08-29 DIAGNOSIS — Z789 Other specified health status: Secondary | ICD-10-CM

## 2018-08-29 DIAGNOSIS — J441 Chronic obstructive pulmonary disease with (acute) exacerbation: Principal | ICD-10-CM

## 2018-08-29 LAB — BASIC METABOLIC PANEL
Anion gap: 7 (ref 5–15)
BUN: 30 mg/dL — ABNORMAL HIGH (ref 8–23)
CO2: 27 mmol/L (ref 22–32)
Calcium: 9 mg/dL (ref 8.9–10.3)
Chloride: 103 mmol/L (ref 98–111)
Creatinine, Ser: 0.72 mg/dL (ref 0.61–1.24)
GFR calc non Af Amer: >60 mL/min (ref >60)
Glucose, Bld: 236 mg/dL — ABNORMAL HIGH (ref 70–99)
Potassium: 5.1 mmol/L (ref 3.5–5.1)
Sodium: 137 mmol/L (ref 135–145)

## 2018-08-29 LAB — CBC
HEMATOCRIT: 39 % (ref 39.0–52.0)
HEMOGLOBIN: 11.7 g/dL — AB (ref 13.0–17.0)
MCH: 28.3 pg (ref 26.0–34.0)
MCHC: 30 g/dL (ref 30.0–36.0)
MCV: 94.2 fL (ref 80.0–100.0)
Platelets: 180 10*3/uL (ref 150–400)
RBC: 4.14 MIL/uL — ABNORMAL LOW (ref 4.22–5.81)
RDW: 14.5 % (ref 11.5–15.5)
WBC: 6.4 10*3/uL (ref 4.0–10.5)
nRBC: 0 % (ref 0.0–0.2)

## 2018-08-29 LAB — POCT I-STAT 3, ART BLOOD GAS (G3+)
Acid-Base Excess: 3 mmol/L — ABNORMAL HIGH (ref 0.0–2.0)
Bicarbonate: 28.7 mmol/L — ABNORMAL HIGH (ref 20.0–28.0)
O2 Saturation: 92 %
Patient temperature: 98.1
TCO2: 30 mmol/L (ref 22–32)
pCO2 arterial: 45.4 mmHg (ref 32.0–48.0)
pH, Arterial: 7.408 (ref 7.350–7.450)
pO2, Arterial: 62 mmHg — ABNORMAL LOW (ref 83.0–108.0)

## 2018-08-29 LAB — PHOSPHORUS
Phosphorus: 3.5 mg/dL (ref 2.5–4.6)
Phosphorus: 2.9 mg/dL (ref 2.5–4.6)

## 2018-08-29 LAB — MAGNESIUM
Magnesium: 1.7 mg/dL (ref 1.7–2.4)
Magnesium: 1.5 mg/dL — ABNORMAL LOW (ref 1.7–2.4)

## 2018-08-29 LAB — GLUCOSE, CAPILLARY
GLUCOSE-CAPILLARY: 198 mg/dL — AB (ref 70–99)
Glucose-Capillary: 216 mg/dL — ABNORMAL HIGH (ref 70–99)
Glucose-Capillary: 229 mg/dL — ABNORMAL HIGH (ref 70–99)
Glucose-Capillary: 228 mg/dL — ABNORMAL HIGH (ref 70–99)
Glucose-Capillary: 175 mg/dL — ABNORMAL HIGH (ref 70–99)

## 2018-08-29 MED ORDER — METHYLPREDNISOLONE SODIUM SUCC 40 MG IJ SOLR
40.0000 mg | Freq: Two times a day (BID) | INTRAMUSCULAR | Status: DC
  Administered 2018-08-30 – 2018-08-31 (×3): 40 mg via INTRAVENOUS
  Filled 2018-08-29 (×3): qty 1

## 2018-08-29 MED ORDER — INSULIN ASPART 100 UNIT/ML ~~LOC~~ SOLN
0.0000 [IU] | SUBCUTANEOUS | Status: DC
  Administered 2018-08-29: 7 [IU] via SUBCUTANEOUS
  Administered 2018-08-29 (×2): 4 [IU] via SUBCUTANEOUS
  Administered 2018-08-29 – 2018-08-30 (×4): 7 [IU] via SUBCUTANEOUS
  Administered 2018-08-30 (×2): 3 [IU] via SUBCUTANEOUS
  Administered 2018-08-30: 7 [IU] via SUBCUTANEOUS
  Administered 2018-08-30 – 2018-08-31 (×4): 4 [IU] via SUBCUTANEOUS
  Administered 2018-08-31 (×2): 3 [IU] via SUBCUTANEOUS
  Administered 2018-09-01 (×6): 4 [IU] via SUBCUTANEOUS
  Administered 2018-09-02: 3 [IU] via SUBCUTANEOUS
  Administered 2018-09-02 (×2): 4 [IU] via SUBCUTANEOUS
  Administered 2018-09-02 – 2018-09-04 (×2): 3 [IU] via SUBCUTANEOUS
  Administered 2018-09-04 (×3): 4 [IU] via SUBCUTANEOUS
  Administered 2018-09-05: 3 [IU] via SUBCUTANEOUS
  Administered 2018-09-05 (×2): 4 [IU] via SUBCUTANEOUS

## 2018-08-29 MED ORDER — INSULIN ASPART 100 UNIT/ML ~~LOC~~ SOLN: 0.0000 [IU] | SUBCUTANEOUS | Status: DC | PRN

## 2018-08-29 MED ORDER — CLEVIDIPINE BUTYRATE 0.5 MG/ML IV EMUL
0.0000 mg/h | INTRAVENOUS | Status: AC
  Filled 2018-08-29 (×2): qty 50

## 2018-08-29 NOTE — Progress Notes (Signed)
Patient transported from 4N to 72M on vent with no issues at this time

## 2018-08-29 NOTE — Progress Notes (Addendum)
STROKE TEAM PROGRESS NOTE   SUBJECTIVE (INTERVAL HISTORY) No family at bedside. The patient remains intubated.  Patient on propofol, but able to follow some simple commands.  However, kept eye closed and resisted for eye opening to check pupil or doll's eyes.  On warming blanket.  On Solu-Medrol for COPD and still has hyperglycemia.  Not able to be extubated today.   OBJECTIVE Vitals:   08/29/18 1100 08/29/18 1200 08/29/18 1217 08/29/18 1221  BP: (!) 147/73 115/69  115/69  Pulse: 63 62  63  Resp: 16 16  16   Temp: (!) 96.6 F (35.9 C) (!) 97 F (36.1 C) 98.1 F (36.7 C)   TempSrc:   Core   SpO2: 96% 94%  94%  Weight:      Height:        CBC:  Recent Labs  Lab 08/24/18 1137  08/28/18 1002 08/29/18 0402  WBC 5.7   < > 5.9 6.4  NEUTROABS 4.1  --  5.0  --   HGB 12.5*   < > 11.3* 11.7*  HCT 41.1   < > 38.9* 39.0  MCV 94.3   < > 95.3 94.2  PLT 213   < > 190 180   < > = values in this interval not displayed.    Basic Metabolic Panel:  Recent Labs  Lab 08/28/18 1002 08/29/18 0402  NA 137 137  K 5.5* 5.1  CL 104 103  CO2 25 27  GLUCOSE 138* 236*  BUN 28* 30*  CREATININE 0.70 0.72  CALCIUM 9.0 9.0  MG 1.5* 1.7  PHOS 3.1 3.5    Lipid Panel:     Component Value Date/Time   CHOL 112 08/28/2018 1002   TRIG 77 08/28/2018 1133   HDL 32 (L) 08/28/2018 1002   CHOLHDL 3.5 08/28/2018 1002   VLDL 15 08/28/2018 1002   LDLCALC 65 08/28/2018 1002   HgbA1c:  Lab Results  Component Value Date   HGBA1C 5.9 (H) 08/28/2018   Urine Drug Screen:     Component Value Date/Time   LABOPIA NONE DETECTED 08/28/2018 1816   COCAINSCRNUR NONE DETECTED 08/28/2018 1816   LABBENZ NONE DETECTED 08/28/2018 1816   AMPHETMU NONE DETECTED 08/28/2018 1816   THCU NONE DETECTED 08/28/2018 1816   LABBARB NONE DETECTED 08/28/2018 1816    Alcohol Level No results found for: ETH  IMAGING  Ct Angio Head W Or Wo Contrast Ct Angio Neck W Or Wo Contrast Ct Cerebral Perfusion W  Contrast 08/28/2018 IMPRESSION:  1. Right M2 inferior division origin occlusion and nonocclusive thrombus of distal right M1. Right M2 thrombus approximately 13 mm in length. Good downstream collateralization.  2. Negative CT brain perfusion for acute stroke by perfusion criteria. Mild asymmetry of transit time, increased in the right posterior cerebral hemisphere, likely related to M2 thrombus.  3. Patent carotid and vertebral arteries of the neck. No dissection, aneurysm, or significant stenosis by NASCET criteria.  4. No additional intracranial large vessel occlusion, aneurysm, or vascular malformation.  5. Mild right vertebral artery origin stenosis with calcified plaque.  6. Mild right proximal ICA stenosis with calcified plaque.  7. Mild right and moderate left cavernous and paraclinoid ICA stenosis with calcified plaque.    Ct Cerebral Perfusion W Contrast 08/28/2018 IMPRESSION:  1. Right lentiform nucleus infarction, ASPECTS 9 on noncontrast CT of head. Pseudo normalized perfusion associated with this area visible infarction.  2. No additional region of acute infarction by perfusion criteria identified.  3. Mildly increased transit  time in the right posterior cerebral hemisphere likely related collateralization of right M2 inferior division occlusion.  Ct Head Code Stroke Wo Contrast 08/28/2018 IMPRESSION:  1. Hypoattenuation within the right lentiform nucleus spanning up to approximately 3.4 cm (5 cc) likely representing an acute or subacute infarction. No associated hemorrhage or mass effect. Increased conspicuity from prior CT of head.  2. ASPECTS is 9    Ct Head Code Stroke Wo Contrast` 08/27/2018 IMPRESSION:  1. No acute hemorrhage.  2. ASPECTS is 10.    Cerebral Angiogram S/P RT common carotid arteriogram followed by complete revascularization of occluded distal RT MCA M1 and co dominant inf division withx 1 pass with 40mm x 31mm embotrap retriever device achieving a  TICI 3 revascularization  Transthoracic Echocardiogram  08/25/2018 Study Conclusions - Left ventricle: The cavity size was normal. There was mild   concentric hypertrophy. Systolic function was normal. The   estimated ejection fraction was in the range of 50% to 55%. There   was septal-lateral dyssynchrony. Doppler parameters are   consistent with abnormal left ventricular relaxation (grade 1   diastolic dysfunction). - Ventricular septum: Septal motion showed abnormal function and   dyssynergy. These changes are consistent with intraventricular   conduction delay. - Aortic valve: Trileaflet; mildly thickened, moderately calcified   leaflets. Morphologically, there appears to be mild calcific   aortic stenosis. - Mitral valve: Mildly calcified annulus. - Right ventricle: The cavity size was mildly dilated. Systolic   function was moderately reduced. - Right atrium: The atrium was mildly dilated. - Tricuspid valve: There was moderate regurgitation. - Pulmonary arteries: PA peak pressure: 57 mm Hg (S). - Inferior vena cava: The vessel was dilated. The respirophasic   diameter changes were blunted (< 50%), consistent with elevated   central venous pressure. Estimated CVP 15 mmHg.  Ct Head Wo Contrast  Result Date: 08/29/2018 CLINICAL DATA:  Follow up stroke. Status post endovascular revascularization of occluded RIGHT M1 segment. History of hypertension, hyperlipidemia, diabetes and prostate cancer. EXAM: CT HEAD WITHOUT CONTRAST TECHNIQUE: Contiguous axial images were obtained from the base of the skull through the vertex without intravenous contrast. COMPARISON:  MRI head June August 28, 2018 and CT HEAD August 28, 2018. FINDINGS: BRAIN: No intraparenchymal hemorrhage, mass effect nor midline shift. Evolving RIGHT lenticulostriate nucleus infarct with petechial hemorrhage. Subcentimeter RIGHT parietal cortical infarct. Subcentimeter pontine hypodensity, potentially beam hardening  artifact. Confluent supratentorial and patchy pontine white matter hypodensities. Moderate parenchymal brain volume loss, advanced for age. No abnormal extra-axial fluid collections. VASCULAR: Moderate to severe calcific atherosclerosis of the carotid siphons. SKULL: No skull fracture. No significant scalp soft tissue swelling. SINUSES/ORBITS: Trace paranasal sinus mucosal thickening. Mastoid air cells are well aerated.The included ocular globes and orbital contents are non-suspicious. OTHER: Life support lines in place.  Patient is edentulous. IMPRESSION: 1. Evolving acute RIGHT basal ganglia nonhemorrhagic infarct with petechial hemorrhage. Additional small acute infarcts better demonstrated on prior MRI. 2. Acute pontine lacunar infarct versus artifact. 3. Moderate to severe chronic small vessel ischemic changes, advanced for age. 4. Moderate parenchymal brain volume loss, advanced for age. Electronically Signed   By: Elon Alas M.D.   On: 08/29/2018 04:05   Mr Brain Wo Contrast  Result Date: 08/28/2018 CLINICAL DATA:  Follow-up stroke with intervention. EXAM: MRI HEAD WITHOUT CONTRAST TECHNIQUE: Multiplanar, multiecho pulse sequences of the brain and surrounding structures were obtained without intravenous contrast. COMPARISON:  Multiple examinations same day. FINDINGS: Brain: Diffusion imaging shows multiple acute infarctions in the  right middle cerebral artery territory. There is acute infarction affecting the right corpus striatum. Petechial blood products are present within this region of infarction. There is a small cortical infarction of the right lateral temporal lobe without hemorrhage. There are small cortical infarctions scattered within the right parietal region. Petechial blood products present associated with the posterior parietal cortical infarctions. No swelling or mass effect. Chronic small-vessel ischemic changes are present elsewhere throughout the cerebral hemispheric white  matter. Vascular: Major vessels at the base of the brain show flow. Skull and upper cervical spine: Negative Sinuses/Orbits: Clear/normal Other: None IMPRESSION: Areas of acute infarction within the right middle cerebral artery territory as outlined above. Largest region affects the corpus striatum of the basal ganglia on the right. Petechial blood products present in that location. Scattered other few small cortical infarctions within the temporal and parietal lobes. Minimal petechial blood products associated with the small posterior parietal cortical infarctions. Electronically Signed   By: Nelson Chimes M.D.   On: 08/28/2018 15:41     PHYSICAL EXAM Blood pressure 115/69, pulse 63, temperature 98.1 F (36.7 C), temperature source Core, resp. rate 16, height 6' (1.829 m), weight 96 kg, SpO2 94 %. Elderly middle-aged African-American male who is intubated and sedated. Afebrile. Head is nontraumatic. Neck is supple without bruit.    Cardiac exam no murmur or gallop. Lungs are clear to auscultation. Distal pulses are well felt.  Neurological Exam Patient is sedated and intubated. Eyes close and resist to be forced open.  Pupil bilateral equal size, sluggish to light reaction, outside present.  However, patient not cooperative on eye opening for further testing.  Positive gag and cough, positive corneal under left but not cooperate on the right.  Fundi were not visualized.  Facial symmetry not able to test due to ET tube. Tongue midline in mouth. Motor system exam he moves right upper extremity spontaneously and against gravity, withdraw to pain summation on the right lower extremity.  However, only trace withdrawal in the left upper extremity and left lower extremity to painful stimuli. Tone is diminished on the left compared to the right. Right plantar downgoing left is upgoing. Sensation, coordination and gait not tested.   ASSESSMENT/PLAN Mr. Dionta Larke is a 67 y.o. male with history of coronary  artery disease previous MI / stenting, COPD, diabetes mellitus, gastroesophageal reflux disease, hypertension, hyperlipidemia, prostate cancer, and peripheral vascular disease presenting with left-sided weakness. He did not receive IV t-PA due to elevated protime.  Stroke: Rt MCA, MCA/ACA, MCA/PCA infarcts - embolic - source unknown, large vessel atherosclerosis versus cardioembolic source versus paradoxical emboli  Resultant  Left hemiplegia  CT head - Hypoattenuation within the right lentiform nucleus likely representing an acute or subacute infarction.  CTA H&N - Right M2 inferior division origin occlusion and nonocclusive thrombus of distal right M1. Right M2 thrombus approximately 13 mm in length.   DSA right occluded M1 and M2 status post IR with TICI3 reperfusion  MRI head - right MCA, MCA/ACA, MCA/PCA infarcts with right BG petechial hemorrhagic transformation  CT repeat right MCA infarct with petechial hemorrhage at right BG  LE venous Doppler pending  2D Echo  - EF 50 - 55%. No cardiac source of emboli identified.   Consider TEE/loop recorder to evaluate cardioembolic source once stable  LDL - 65  HgbA1c - 5.9  UDS - not performed  VTE prophylaxis - [place] Heparin  Diet - NPO  No antithrombotic prior to admission, now on aspirin 81 mg daily.  Continue aspirin 81 given only petechial hemorrhage at right BG  Patient will be counseled to be compliant with his antithrombotic medications  Ongoing aggressive stroke risk factor management  Therapy recommendations:  pending  Disposition:  Pending  Respiratory failure  Intubated  CCM on board  High PEEP, not able to be extubated today  On steroids  Hypertension  Stable . BP goal less than 160 post procedure . Long-term BP goal normotensive  Diabetes  HgbA1c 5.9, goal < 7.0  Controlled  However, hyperglycemia could be due to steroids use  On Lantus  SSI  CBG monitoring  Tobacco abuse  Current  smoker  Smoking cessation counseling will be provided  Pt is willing to quit   Other Stroke Risk Factors  Advanced age  Coronary artery disease/MI status post stent 2009  PVD  Other Active Problems  Hyperkalemia - 5.5 -> Kayexalate per CCM-> 5.1  Prostate cancer  Acute gastroenteritis, resolved  AKI, resolved  Hospital day # 5  This patient is critically ill and at significant risk of neurological worsening, death and care requires constant monitoring of vital signs, hemodynamics,respiratory and cardiac monitoring, extensive review of multiple databases, frequent neurological assessment, discussion with family, other specialists and medical decision making of high complexity. I spent 35 minutes of neurocritical care time  in the care of  this patient.  I also discussed with Dr. Lamonte Sakai.   Rosalin Hawking, MD PhD Stroke Neurology 08/29/2018 1:43 PM  To contact Stroke Continuity provider, please refer to [email or web address] After hours, contact General Neurology

## 2018-08-29 NOTE — Progress Notes (Signed)
PT Cancellation Note  Patient Details Name: Clayton [name] MRN: [id number] DOB: [birth year]   Cancelled Treatment:    Reason Eval/Treat Not Completed: discussed with RN this AM, pt still sedated and too lethargic to participate in therapy. Will cont to follow.  Reinaldo Berber, PT, DPT Acute Rehabilitation Services Pager: [phone] Office: [phone]     Reinaldo Berber 08/29/2018, 10:08 AM

## 2018-08-29 NOTE — Progress Notes (Signed)
NAME:  Ceferino Lang, MRN:  [id number], DOB:  1951-04-01, LOS: 5 ADMISSION DATE:  08/24/2018, CONSULTATION DATE:  08/28/2018 REFERRING MD:  Dr. Leonel Ramsay, CHIEF COMPLAINT:  Stroke  Brief History   34 yoM originally admitted to [facility] on 12/9 with AECOPD, AKI, dehydration s/p N/V for several days found with acute left sided deficits.  Code stroke initiated.  Found to have acute Right M2 occlusion.  TPA not given.  Transferred emergently to [facility] and taken to Neuro IR.  PCCM consulted for vent management.  History of present illness   HPI obtained from medical chart review as patient is currently intubated and sedated on mechanical ventilation.  67 year old male with history of tobacco abuse, COPD, chronic hypoxic respiratory failure on home O2 2Ls, HTN, HLD, DM, CAD w/prior MI, GERD, and prostate CA initially admitted to [facility] on 12/9 after being found on floor.  Patient complained of progressive SOB x 2 weeks, 1 week history of nausea, vomiting, and diarrhea with poor PO intake, dizziness, and generalized weakness.    Admitted for AECOPD, hypoxia, dehydration, AKI, elevated LFTs, and troponin.  Initial CXR and head CT negative.  Evaluated by Cardiology and empirically treated with heparin until 12/10 after TTE did not show any wall motion abnormality and elevation in troponin thought due to demand ischemia.  He has been treated with steroids, nebs, azithro and HFNC weaned down to 5L [place] as of yesterday. He has been afebrile and normotensive. Negative GI workup thus far (-stool, -RUQ [place], -hepatitis panel).  AKI resolved after IV hydration and transaminitis is improving.   LSW around shift change 1900 on 12/12.  After 2030, found to have left facial droop, aphasia, and left sided weakness.  Code stroke activated and assessed by tele Neuro.  NIHSS 16. CT head negative.  Decision made not to give TPA due to elevated PT/INR 15.9/ 1.28.  CTA head/ neck noted for acute right MCA occlusion.    Patient was emergently transferred to [facility] and taken to neuro IR for embolectomy.  Intubated for procedure and will return to ICU on ventilator.  Extubation deferred given ongoing AECOPD.  Therefore, PCCM consulted for further medical and ventilator management in ICU.  Past Medical History   tobacco abuse, COPD, chronic hypoxic respiratory failure on home O2 2Ls, HTN, HLD, DM, CAD w/prior MI, GERD, and prostate [place] [facility] Events   12/9 Admit at [facility] 12/13 tx to [facility]  Consults:   Procedures:  12/13 cerebral angiogram  12/13 ETT >>  Significant Diagnostic Tests:  12/9 [facility]  >> neg 12/10 TTE >> EF 50-55%, G1DD, mild AS, mod TR, PAP 57 mmHg 12/12 CTH >> neg; ASPECTS 10  12/12 CTA head/neck >> 1. Right M2 inferior division origin occlusion and nonocclusive thrombus of distal right M1. Right M2 thrombus approximately 13 mm in length. Good downstream collateralization. 2. Negative CT brain perfusion for acute stroke by perfusion criteria. Mild asymmetry of transit time, increased in the right posterior cerebral hemisphere, likely related to M2 thrombus. 3. Patent carotid and vertebral arteries of the neck. No dissection, aneurysm, or significant stenosis by NASCET criteria. 4. No additional intracranial large vessel occlusion, aneurysm, or vascular malformation. 5. Mild right vertebral artery origin stenosis with calcified plaque. 6. Mild right proximal ICA stenosis with calcified plaque. 7. Mild right and moderate left cavernous and paraclinoid ICA stenosis with calcified plaque.  12/13 CTH >> 1. Hypoattenuation within the right lentiform nucleus spanning up to approximately 3.4  cm (5 cc) likely representing an acute or subacute infarction. No associated hemorrhage or mass effect. Increased conspicuity from prior CT of head. 2. ASPECTS is 9  12/13 CT cerebral perfusion >>  1. Right lentiform nucleus infarction, ASPECTS 9 on noncontrast CT of head. Pseudo normalized  perfusion associated with this area visible infarction. 2. No additional region of acute infarction by perfusion criteria identified. 3. Mildly increased transit time in the right posterior cerebral hemisphere likely related collateralization of right M2 inferior division occlusion.  Micro Data:  12/10 MRSA PCR  >> neg 12/10 Cdiff  >> meg 12/10 GI panel >> neg  Antimicrobials:  12/9 azithro >> 12/13 ancef pre op  Interim history/subjective:  Cleviprex has been weaned off He has remained on aspirin MRI brain as above Remains on high FiO2 and PEEP 12 Has had hyperglycemia  Objective   Blood pressure 124/67, pulse 78, temperature 99.9 F (37.7 C), resp. rate 20, height 6' (1.829 m), weight 96 kg, SpO2 94 %.    Vent Mode: PRVC FiO2 (%):  [70 %-80 %] 80 % Set Rate:  [16 bmp] 16 bmp Vt Set:  [620 mL] 620 mL PEEP:  [12 cmH20] 12 cmH20 Plateau Pressure:  [15 cmH20-23 cmH20] 15 cmH20   Intake/Output Summary (Last 24 hours) at 08/29/2018 1737 Last data filed at 08/29/2018 1500 Gross per 24 hour  Intake 2731.53 ml  Output 2260 ml  Net 471.53 ml   Filed Weights   08/27/18 0427 08/27/18 2200 08/29/18 0500  Weight: 95 kg 95.5 kg 96 kg    Examination: General: Chronically ill, ventilated no distress HENT: ET tube in place, no oral lesions, pupils equal Lungs: Coarse bilaterally Cardiovascular: Regular, no murmur Abdomen: Soft, nondistended with positive bowel sounds Extremities: No edema Neuro: Able to follow some simple commands on propofol Skin: No rash  Resolved Hospital Problem list    Assessment & Plan:  Right MCA stroke s/p EVR and complete revascularization, was extubated in the PACU post op but mental status was poor, unable to protect his airway and was reintubated.  Acute right MCA embolic stroke, question atherosclerosis versus cardioembolic versus paradoxical emboli.  Status post thrombectomy.  Some petechial hemorrhage at the right basal ganglia Per neurology  okay to reinitiate aspirin (done)  Acute respiratory failure with hypoxemia: Continues to require high PEEP and FiO2 Follow chest x-ray, degree of hypoxemia seems to be out of proportion to his bibasilar infiltrates, atelectasis Wean PEEP and FiO2 as able  COPD exacerbation: Decrease Solu-Medrol to twice daily Continue nebs as ordered  Hyperglycemia Sliding scale insulin as ordered Lantus 25 as ordered  History CAD and PTCI Aspirin  HTN:  Cleviprex has been weaned off Carvedilol   Hyperkalemia: Follow serial BMP   Labs   CBC: Recent Labs  Lab 08/24/18 1137  08/25/18 0407 08/26/18 0409 08/27/18 0403 08/28/18 0559 08/28/18 1002 08/29/18 0402  WBC 5.7  --  5.0 7.6 8.2  --  5.9 6.4  NEUTROABS 4.1  --   --   --   --   --  5.0  --   HGB 12.5*   < > 11.4* 12.0* 12.2* 13.6 11.3* 11.7*  HCT 41.1   < > 37.8* 39.1 40.8 40.0 38.9* 39.0  MCV 94.3  --  94.5 95.8 98.3  --  95.3 94.2  PLT 213  --  178 213 193  --  190 180   < > = values in this interval not displayed.    Basic Metabolic  Panel: Recent Labs  Lab 08/26/18 0409 08/27/18 0403 08/27/18 1136 08/28/18 0559 08/28/18 1002 08/29/18 0402 08/29/18 1638  NA 136 136 133* 138 137 137  --   K 5.4* 5.9* 5.7* 5.6* 5.5* 5.1  --   CL 105 105 103  --  104 103  --   CO2 24 25 24   --  25 27  --   GLUCOSE 202* 220* 217*  --  138* 236*  --   BUN 45* 39* 38*  --  28* 30*  --   CREATININE 1.06 0.88 0.89  --  0.70 0.72  --   CALCIUM 8.9 9.0 8.9  --  9.0 9.0  --   MG  --   --   --   --  1.5* 1.7 1.5*  PHOS  --   --   --   --  3.1 3.5 2.9   GFR: Estimated Creatinine Clearance: 109.2 mL/min (by C-G formula based on SCr of 0.72 mg/dL). Recent Labs  Lab 08/24/18 1145 08/24/18 1258  08/26/18 0409 08/27/18 0403 08/28/18 1002 08/29/18 0402  WBC  --   --    < > 7.6 8.2 5.9 6.4  LATICACIDVEN 2.42* 2.58*  --   --   --   --   --    < > = values in this interval not displayed.    Liver Function Tests: Recent Labs  Lab  08/24/18 1137 08/25/18 0407 08/26/18 0409 08/27/18 0403  AST 484* 281* 208* 190*  ALT 349* 308* 351* 375*  ALKPHOS 141* 116 116 105  BILITOT 3.1* 2.3* 2.1* 1.8*  PROT 8.7* 7.3 7.7 7.2  ALBUMIN 4.1 3.4* 3.5 3.4*   Recent Labs  Lab 08/24/18 1137  LIPASE 23   No results for input(s): AMMONIA in the last 168 hours.  ABG    Component Value Date/Time   PHART 7.408 08/29/2018 0557   PCO2ART 45.4 08/29/2018 0557   PO2ART 62.0 (L) 08/29/2018 0557   HCO3 28.7 (H) 08/29/2018 0557   TCO2 30 08/29/2018 0557   ACIDBASEDEF 3.0 (H) 08/28/2018 0559   O2SAT 92.0 08/29/2018 0557     Coagulation Profile: Recent Labs  Lab 08/27/18 2111  INR 1.28    Cardiac Enzymes: Recent Labs  Lab 08/24/18 1146 08/24/18 1644 08/24/18 2214 08/25/18 0407  CKTOTAL 574*  --   --   --   TROPONINI  --  1.24* 0.81* 0.59*    HbA1C: Hgb A1c MFr Bld  Date/Time Value Ref Range Status  08/28/2018 10:02 AM 5.9 (H) 4.8 - 5.6 % Final    Comment:    (NOTE) Pre diabetes:          5.7%-6.4% Diabetes:              >6.4% Glycemic control for   <7.0% adults with diabetes   05/13/2017 05:10 PM 6.4 (H) 4.8 - 5.6 % Final    Comment:    (NOTE) Pre diabetes:          5.7%-6.4% Diabetes:              >6.4% Glycemic control for   <7.0% adults with diabetes     CBG: Recent Labs  Lab 08/28/18 2330 08/29/18 0329 08/29/18 0751 08/29/18 1217 08/29/18 1537  GLUCAP 187* 198* 216* 229* 228*    Independent CC time 32 min  Baltazar Apo, MD, PhD 08/29/2018, 5:48 PM [facility] [phone] or if no answer [phone]

## 2018-08-29 NOTE — Progress Notes (Signed)
OT Cancellation Note  Patient Details Name: Clayton [name] MRN: [id number] DOB: Mar 18, 1951   Cancelled Treatment:    Reason Eval/Treat Not Completed: Patient not medically ready(On vent (peep12); sedated)  Ramond Dial, OT/L   Acute OT Clinical Specialist Acute Rehabilitation Services Pager [phone] Office [phone]  08/29/2018, 9:29 AM

## 2018-08-30 ENCOUNTER — Inpatient Hospital Stay (HOSPITAL_COMMUNITY): Payer: Medicare Other

## 2018-08-30 ENCOUNTER — Encounter (HOSPITAL_COMMUNITY): Payer: Medicare Other

## 2018-08-30 DIAGNOSIS — I63311 Cerebral infarction due to thrombosis of right middle cerebral artery: Secondary | ICD-10-CM

## 2018-08-30 LAB — CBC
HCT: 38.4 % — ABNORMAL LOW (ref 39.0–52.0)
Hemoglobin: 11.9 g/dL — ABNORMAL LOW (ref 13.0–17.0)
MCH: 28.7 pg (ref 26.0–34.0)
MCHC: 31 g/dL (ref 30.0–36.0)
MCV: 92.8 fL (ref 80.0–100.0)
Platelets: 177 10*3/uL (ref 150–400)
RBC: 4.14 MIL/uL — ABNORMAL LOW (ref 4.22–5.81)
RDW: 14.7 % (ref 11.5–15.5)
WBC: 8.3 10*3/uL (ref 4.0–10.5)
nRBC: 0 % (ref 0.0–0.2)

## 2018-08-30 LAB — BASIC METABOLIC PANEL
ANION GAP: 9 (ref 5–15)
BUN: 33 mg/dL — ABNORMAL HIGH (ref 8–23)
CO2: 25 mmol/L (ref 22–32)
Calcium: 8.9 mg/dL (ref 8.9–10.3)
Chloride: 103 mmol/L (ref 98–111)
Creatinine, Ser: 0.76 mg/dL (ref 0.61–1.24)
GFR calc Af Amer: >60 mL/min (ref >60)
GFR calc non Af Amer: >60 mL/min (ref >60)
Glucose, Bld: 241 mg/dL — ABNORMAL HIGH (ref 70–99)
Potassium: 5.1 mmol/L (ref 3.5–5.1)
Sodium: 137 mmol/L (ref 135–145)

## 2018-08-30 LAB — MAGNESIUM
Magnesium: 1.6 mg/dL — ABNORMAL LOW (ref 1.7–2.4)
Magnesium: 1.6 mg/dL — ABNORMAL LOW (ref 1.7–2.4)

## 2018-08-30 LAB — PHOSPHORUS
Phosphorus: 3 mg/dL (ref 2.5–4.6)
Phosphorus: 3 mg/dL (ref 2.5–4.6)

## 2018-08-30 LAB — GLUCOSE, CAPILLARY
GLUCOSE-CAPILLARY: 216 mg/dL — AB (ref 70–99)
Glucose-Capillary: 220 mg/dL — ABNORMAL HIGH (ref 70–99)
Glucose-Capillary: 190 mg/dL — ABNORMAL HIGH (ref 70–99)
Glucose-Capillary: 219 mg/dL — ABNORMAL HIGH (ref 70–99)

## 2018-08-30 MED ORDER — IOPAMIDOL (ISOVUE-370) INJECTION 76%
INTRAVENOUS | Status: AC
  Filled 2018-08-30: qty 100

## 2018-08-30 MED ORDER — GUAIFENESIN 100 MG/5ML PO SOLN
15.0000 mL | Freq: Four times a day (QID) | ORAL | Status: DC | PRN
  Administered 2018-08-30 – 2018-09-01 (×3): 300 mg via ORAL
  Filled 2018-08-30 (×3): qty 15

## 2018-08-30 MED ORDER — IOPAMIDOL (ISOVUE-370) INJECTION 76%
75.0000 mL | Freq: Once | INTRAVENOUS | Status: AC | PRN
  Administered 2018-08-30: 75 mL via INTRAVENOUS

## 2018-08-30 NOTE — Progress Notes (Signed)
STROKE TEAM PROGRESS NOTE   SUBJECTIVE (INTERVAL HISTORY) Sister and brother in law are at bedside. The patient remains intubated and sedated but he is awake and alert and following all simple commands. His PEEP still high and CTA chest showed no PE. LE venous doppler pending   OBJECTIVE Vitals:   08/30/18 0915 08/30/18 1000 08/30/18 1100 08/30/18 1300  BP: 137/74 (!) 156/70 134/69   Pulse: 78 71 66   Resp: (!) 22 17 16    Temp:  98.1 F (36.7 C) 98.1 F (36.7 C)   TempSrc:      SpO2: 97% 97% 96% 97%  Weight:      Height:        CBC:  Recent Labs  Lab 08/24/18 1137  08/28/18 1002 08/29/18 0402 08/30/18 0519  WBC 5.7   < > 5.9 6.4 8.3  NEUTROABS 4.1  --  5.0  --   --   HGB 12.5*   < > 11.3* 11.7* 11.9*  HCT 41.1   < > 38.9* 39.0 38.4*  MCV 94.3   < > 95.3 94.2 92.8  PLT 213   < > 190 180 177   < > = values in this interval not displayed.    Basic Metabolic Panel:  Recent Labs  Lab 08/29/18 0402 08/29/18 1638 08/30/18 0519  NA 137  --  137  K 5.1  --  5.1  CL 103  --  103  CO2 27  --  25  GLUCOSE 236*  --  241*  BUN 30*  --  33*  CREATININE 0.72  --  0.76  CALCIUM 9.0  --  8.9  MG 1.7 1.5* 1.6*  PHOS 3.5 2.9 3.0    Lipid Panel:     Component Value Date/Time   CHOL 112 08/28/2018 1002   TRIG 77 08/28/2018 1133   HDL 32 (L) 08/28/2018 1002   CHOLHDL 3.5 08/28/2018 1002   VLDL 15 08/28/2018 1002   LDLCALC 65 08/28/2018 1002   HgbA1c:  Lab Results  Component Value Date   HGBA1C 5.9 (H) 08/28/2018   Urine Drug Screen:     Component Value Date/Time   LABOPIA NONE DETECTED 08/28/2018 1816   COCAINSCRNUR NONE DETECTED 08/28/2018 1816   LABBENZ NONE DETECTED 08/28/2018 1816   AMPHETMU NONE DETECTED 08/28/2018 1816   THCU NONE DETECTED 08/28/2018 1816   LABBARB NONE DETECTED 08/28/2018 1816    Alcohol Level No results found for: ETH  IMAGING  Ct Angio Head W Or Wo Contrast Ct Angio Neck W Or Wo Contrast Ct Cerebral Perfusion W  Contrast 08/28/2018 IMPRESSION:  1. Right M2 inferior division origin occlusion and nonocclusive thrombus of distal right M1. Right M2 thrombus approximately 13 mm in length. Good downstream collateralization.  2. Negative CT brain perfusion for acute stroke by perfusion criteria. Mild asymmetry of transit time, increased in the right posterior cerebral hemisphere, likely related to M2 thrombus.  3. Patent carotid and vertebral arteries of the neck. No dissection, aneurysm, or significant stenosis by NASCET criteria.  4. No additional intracranial large vessel occlusion, aneurysm, or vascular malformation.  5. Mild right vertebral artery origin stenosis with calcified plaque.  6. Mild right proximal ICA stenosis with calcified plaque.  7. Mild right and moderate left cavernous and paraclinoid ICA stenosis with calcified plaque.    Ct Cerebral Perfusion W Contrast 08/28/2018 IMPRESSION:  1. Right lentiform nucleus infarction, ASPECTS 9 on noncontrast CT of head. Pseudo normalized perfusion associated with this area visible  infarction.  2. No additional region of acute infarction by perfusion criteria identified.  3. Mildly increased transit time in the right posterior cerebral hemisphere likely related collateralization of right M2 inferior division occlusion.  Ct Head Code Stroke Wo Contrast 08/28/2018 IMPRESSION:  1. Hypoattenuation within the right lentiform nucleus spanning up to approximately 3.4 cm (5 cc) likely representing an acute or subacute infarction. No associated hemorrhage or mass effect. Increased conspicuity from prior CT of head.  2. ASPECTS is 9    Ct Head Code Stroke Wo Contrast` 08/27/2018 IMPRESSION:  1. No acute hemorrhage.  2. ASPECTS is 10.    Cerebral Angiogram S/P RT common carotid arteriogram followed by complete revascularization of occluded distal RT MCA M1 and co dominant inf division withx 1 pass with 38mm x 62mm embotrap retriever device achieving a  TICI 3 revascularization  Transthoracic Echocardiogram  08/25/2018 Study Conclusions - Left ventricle: The cavity size was normal. There was mild   concentric hypertrophy. Systolic function was normal. The   estimated ejection fraction was in the range of 50% to 55%. There   was septal-lateral dyssynchrony. Doppler parameters are   consistent with abnormal left ventricular relaxation (grade 1   diastolic dysfunction). - Ventricular septum: Septal motion showed abnormal function and   dyssynergy. These changes are consistent with intraventricular   conduction delay. - Aortic valve: Trileaflet; mildly thickened, moderately calcified   leaflets. Morphologically, there appears to be mild calcific   aortic stenosis. - Mitral valve: Mildly calcified annulus. - Right ventricle: The cavity size was mildly dilated. Systolic   function was moderately reduced. - Right atrium: The atrium was mildly dilated. - Tricuspid valve: There was moderate regurgitation. - Pulmonary arteries: PA peak pressure: 57 mm Hg (S). - Inferior vena cava: The vessel was dilated. The respirophasic   diameter changes were blunted (< 50%), consistent with elevated   central venous pressure. Estimated CVP 15 mmHg.   Ct Head Wo Contrast 08/29/2018 IMPRESSION:  1. Evolving acute RIGHT basal ganglia nonhemorrhagic infarct with petechial hemorrhage. Additional small acute infarcts better demonstrated on prior MRI.  2. Acute pontine lacunar infarct versus artifact.   3. Moderate to severe chronic small vessel ischemic changes, advanced for age.  4. Moderate parenchymal brain volume loss, advanced for age.     Mr Brain Wo Contrast 08/28/2018 IMPRESSION:  Areas of acute infarction within the right middle cerebral artery territory as outlined above. Largest region affects the corpus striatum of the basal ganglia on the right. Petechial blood products present in that location. Scattered other few small cortical  infarctions within the temporal and parietal lobes. Minimal petechial blood products associated with the small posterior parietal cortical infarctions.   LE venous doppler pending    PHYSICAL EXAM Blood pressure 134/69, pulse 66, temperature 98.1 F (36.7 C), resp. rate 16, height 6' (1.829 m), weight 96.6 kg, SpO2 97 %. Elderly middle-aged African-American male who is still intubated and sedated. Afebrile. Head is nontraumatic. Neck is supple without bruit.    Cardiac exam no murmur or gallop. Lungs are clear to auscultation. Distal pulses are well felt.  Neurological Exam Patient is still intubated and on propofol. However, he is awake and alert and following all simple commands. Eyes open.  Pupil bilateral equal size, EOMI, blinking to visual threat bilaterally.  Positive gag and cough, positive corneal.  Fundi were not visualized.  Facial symmetry not able to test due to ET tube. Tongue midline in mouth. Motor system exam he moves right  upper extremity spontaneously and against gravity, 3-/5 LUE proximal but 0/5 distal. BLE proximal 0/5 but knee flexion 3/5 on the right and 2/5 on the left, bilateral foot DF 3/5 on the right , minimal movement on the left. Tone is diminished on the left compared to the right. Babinski negative bilateral. Sensation, coordination and gait not tested.   ASSESSMENT/PLAN [name] is a 67 y.o. male with history of coronary artery disease previous MI / stenting, COPD, diabetes mellitus, gastroesophageal reflux disease, hypertension, hyperlipidemia, prostate cancer, and peripheral vascular disease presenting with left-sided weakness. He did not receive IV t-PA due to elevated protime.  Stroke: Rt MCA, MCA/ACA, MCA/PCA infarcts - embolic - source unknown, large vessel atherosclerosis versus cardioembolic source versus paradoxical emboli  Resultant  Left hemiplegia  CT head - Hypoattenuation within the right lentiform nucleus likely representing an acute or  subacute infarction.  CTA H&N - Right M2 inferior division origin occlusion and nonocclusive thrombus of distal right M1. Right M2 thrombus approximately 13 mm in length.   DSA right occluded M1 and M2 status post IR with TICI3 reperfusion  MRI head - right MCA, MCA/ACA, MCA/PCA infarcts with right BG petechial hemorrhagic transformation  CT repeat right MCA infarct with petechial hemorrhage at right BG  LE venous Doppler pending  2D Echo  - EF 50 - 55%. No cardiac source of emboli identified.   Consider TEE/loop recorder to evaluate cardioembolic source once stable  LDL - 65  HgbA1c - 5.9  UDS - not performed  VTE prophylaxis - Pierce Heparin  Diet - NPO  No antithrombotic prior to admission, now on aspirin 81 mg daily.  Continue aspirin 81 given only petechial hemorrhage at right BG  Patient will be counseled to be compliant with his antithrombotic medications  Ongoing aggressive stroke risk factor management  Therapy recommendations:  pending  Disposition:  Pending  Respiratory failure  Intubated  CCM on board  High PEEP, not able to be extubated   CTA chest no PE  Still on steroids  Hypertension  Stable . BP goal less than 160 post procedure . Labetalol PRN . Long-term BP goal normotensive  Diabetes  HgbA1c 5.9, goal < 7.0  Controlled  However, hyperglycemia continued, likely due to steroids use  On Lantus  SSI  CBG monitoring  Tobacco abuse  Current smoker  Smoking cessation counseling will be provided  Pt is willing to quit  Other Stroke Risk Factors  Advanced age  Coronary artery disease/MI status post stent 2009  PVD  Other Active Problems  Hyperkalemia - 5.5 -> Kayexalate per CCM-> 5.1->5.1  Prostate cancer  Acute gastroenteritis, resolved  AKI, resolved  Hospital day # 6  This patient is critically ill and at significant risk of neurological worsening, death and care requires constant monitoring of vital signs,  hemodynamics,respiratory and cardiac monitoring, extensive review of multiple databases, frequent neurological assessment, discussion with family, other specialists and medical decision making of high complexity. I spent 35 minutes of neurocritical care time  in the care of  this patient. I had long discussion with sister and brother in law at bedside, updated pt current condition, treatment plan and potential prognosis. They expressed understanding and appreciation.   Rosalin Hawking, MD PhD Stroke Neurology 08/30/2018 10:07 PM    To contact Stroke Continuity provider, please refer to [email or web address] After hours, contact General Neurology

## 2018-08-30 NOTE — Progress Notes (Signed)
PT Cancellation Note  Patient Details Name: Clayton [name] MRN: [id number] DOB: 1951/04/18   Cancelled Treatment:    Reason Eval/Treat Not Completed: Patient not medically ready discussed with RN, pt with continued  sedation, high FiO2 and PEEP demands on vent. Will cont to follow.  Reinaldo Berber, PT, DPT Acute Rehabilitation Services Pager: [phone] Office: [phone]     Reinaldo Berber 08/30/2018, 9:16 AM

## 2018-08-30 NOTE — Progress Notes (Signed)
Referring Physician(s): Sethi,P  Supervising Physician: Luanne Bras  Patient Status:  [facility] - In-pt  Chief Complaint: CVA complete revascularization of occluded distal RT MCA M1 and co dominant inf division withx 1 pass with 14mm x 91mm embotrap retriever device achieving a TICI 3 revascularization   Subjective: Intubated, will open eyes occasionally, respond to voice  and follow some commands.   Allergies: Patient has no known allergies.  Medications: Prior to Admission medications   Medication Sig Start Date End Date Taking? Authorizing Provider  albuterol (PROVENTIL HFA;VENTOLIN HFA) 108 (90 BASE) MCG/ACT inhaler Inhale 2 puffs into the lungs every 6 (six) hours as needed for wheezing or shortness of breath. Please instruct in proper technique. 06/29/15  Yes Samuella Cota, MD  amLODipine (NORVASC) 5 MG tablet Take 5 mg by mouth daily.   Yes [provider]  carvedilol (COREG) 6.25 MG tablet Take 1 tablet by mouth 2 (two) times daily. 01/06/16  Yes [provider]  Fluticasone-Salmeterol (ADVAIR DISKUS) 250-50 MCG/DOSE AEPB Inhale 1 puff into the lungs 2 (two) times daily. 05/17/17 08/24/18 Yes Orvan Falconer, MD  glipiZIDE (GLUCOTROL) 10 MG tablet Take 10 mg by mouth 2 (two) times daily.   Yes [provider]  metFORMIN (GLUCOPHAGE) 1000 MG tablet Take 1 tablet by mouth 2 (two) times daily. 05/14/15  Yes [provider]  ondansetron (ZOFRAN) 4 MG tablet Take 4 mg by mouth every 8 (eight) hours as needed for nausea or vomiting.   Yes [provider]  sildenafil (REVATIO) 20 MG tablet Take 2-5 tablets by mouth daily as needed (intercourse).  01/08/16   [provider]     Vital Signs: BP 134/69   Pulse 66   Temp 98.1 F (36.7 C)   Resp 16   Ht 6' (1.829 m)   Wt 212 lb 15.4 oz (96.6 kg)   SpO2 97%   BMI 28.88 kg/m   Physical Exam intubated, occasionally open eyes, can wiggle toes when prompted; right groin puncture  site soft, clean, dry, no hematoma  Imaging: Ct Angio Head W Or Wo Contrast  Result Date: 08/28/2018 CLINICAL DATA:  67 y/o M; code stroke, left facial droop and altered mental status. EXAM: CT ANGIOGRAPHY HEAD AND NECK CT PERFUSION BRAIN TECHNIQUE: Multidetector CT imaging of the head and neck was performed using the standard protocol during bolus administration of intravenous contrast. Multiplanar CT image reconstructions and MIPs were obtained to evaluate the vascular anatomy. Carotid stenosis measurements (when applicable) are obtained utilizing NASCET criteria, using the distal internal carotid diameter as the denominator. Multiphase CT imaging of the brain was performed following IV bolus contrast injection. Subsequent parametric perfusion maps were calculated using RAPID software. CONTRAST:  157mL ISOVUE-370 IOPAMIDOL (ISOVUE-370) INJECTION 76% COMPARISON:  08/27/2018 CT head. FINDINGS: CTA NECK FINDINGS Aortic arch: Standard branching. Imaged portion shows no evidence of aneurysm or dissection. No significant stenosis of the major arch vessel origins. Moderate mixed plaque of the arch. Right carotid system: No evidence of dissection, stenosis (50% or greater) or occlusion. Right carotid bifurcation calcified plaque with mild less than 50% proximal ICA stenosis. Left carotid system: No evidence of dissection, stenosis (50% or greater) or occlusion. Non stenotic calcified plaque of the carotid bifurcation. Vertebral arteries: Calcified plaque of right vertebral artery origin with mild less than 50% stenosis. Left dominant vertebral arteries. No additional segment of significant stenosis, dissection, or aneurysm. Skeleton: Moderate spondylosis of the cervical spine with degenerative changes greatest at the C5-C7 levels.  No high-grade bony canal stenosis. Other neck: Negative. Upper chest: Small bilateral pleural effusions. Moderate to severe emphysema. Review of the MIP images confirms the above findings  CTA HEAD FINDINGS Anterior circulation: Right M2 inferior division proximal occlusion with good reconstitution of the vessel in distal collateralization. Right M2 inferior division thrombus approximately 13 mm in length (series 9, image 25). Nonocclusive clot appears to extend into the distal right M1. The right M2 superior division is widely patent. Calcific atherosclerosis of carotid siphons with mild right and moderate left distal cavernous and paraclinoid stenosis. Posterior circulation: No significant stenosis, proximal occlusion, aneurysm, or vascular malformation. Venous sinuses: As permitted by contrast timing, patent. Anatomic variants: None significant. Review of the MIP images confirms the above findings CT Brain Perfusion Findings: CBF (<30%) Volume: 51mL Perfusion (Tmax>6.0s) volume: 57mL, right parietal white matter. Mismatch Volume: 29mL Infarction Location:No acute stroke by perfusion criteria. IMPRESSION: 1. Right M2 inferior division origin occlusion and nonocclusive thrombus of distal right M1. Right M2 thrombus approximately 13 mm in length. Good downstream collateralization. 2. Negative CT brain perfusion for acute stroke by perfusion criteria. Mild asymmetry of transit time, increased in the right posterior cerebral hemisphere, likely related to M2 thrombus. 3. Patent carotid and vertebral arteries of the neck. No dissection, aneurysm, or significant stenosis by NASCET criteria. 4. No additional intracranial large vessel occlusion, aneurysm, or vascular malformation. 5. Mild right vertebral artery origin stenosis with calcified plaque. 6. Mild right proximal ICA stenosis with calcified plaque. 7. Mild right and moderate left cavernous and paraclinoid ICA stenosis with calcified plaque. These results were called by telephone at the time of interpretation on 08/28/2018 at 12:01 am to Dr. Darrick Meigs , who verbally acknowledged these results. Electronically Signed   By: Kristine Garbe M.D.   On:  08/28/2018 00:07   Ct Head Wo Contrast  Result Date: 08/29/2018 CLINICAL DATA:  Follow up stroke. Status post endovascular revascularization of occluded RIGHT M1 segment. History of hypertension, hyperlipidemia, diabetes and prostate cancer. EXAM: CT HEAD WITHOUT CONTRAST TECHNIQUE: Contiguous axial images were obtained from the base of the skull through the vertex without intravenous contrast. COMPARISON:  MRI head June August 28, 2018 and CT HEAD August 28, 2018. FINDINGS: BRAIN: No intraparenchymal hemorrhage, mass effect nor midline shift. Evolving RIGHT lenticulostriate nucleus infarct with petechial hemorrhage. Subcentimeter RIGHT parietal cortical infarct. Subcentimeter pontine hypodensity, potentially beam hardening artifact. Confluent supratentorial and patchy pontine white matter hypodensities. Moderate parenchymal brain volume loss, advanced for age. No abnormal extra-axial fluid collections. VASCULAR: Moderate to severe calcific atherosclerosis of the carotid siphons. SKULL: No skull fracture. No significant scalp soft tissue swelling. SINUSES/ORBITS: Trace paranasal sinus mucosal thickening. Mastoid air cells are well aerated.The included ocular globes and orbital contents are non-suspicious. OTHER: Life support lines in place.  Patient is edentulous. IMPRESSION: 1. Evolving acute RIGHT basal ganglia nonhemorrhagic infarct with petechial hemorrhage. Additional small acute infarcts better demonstrated on prior MRI. 2. Acute pontine lacunar infarct versus artifact. 3. Moderate to severe chronic small vessel ischemic changes, advanced for age. 4. Moderate parenchymal brain volume loss, advanced for age. Electronically Signed   By: Elon Alas M.D.   On: 08/29/2018 04:05   Ct Angio Neck W Or Wo Contrast  Result Date: 08/28/2018 CLINICAL DATA:  67 y/o M; code stroke, left facial droop and altered mental status. EXAM: CT ANGIOGRAPHY HEAD AND NECK CT PERFUSION BRAIN TECHNIQUE: Multidetector  CT imaging of the head and neck was performed using the standard protocol during bolus administration  of intravenous contrast. Multiplanar CT image reconstructions and MIPs were obtained to evaluate the vascular anatomy. Carotid stenosis measurements (when applicable) are obtained utilizing NASCET criteria, using the distal internal carotid diameter as the denominator. Multiphase CT imaging of the brain was performed following IV bolus contrast injection. Subsequent parametric perfusion maps were calculated using RAPID software. CONTRAST:  157mL ISOVUE-370 IOPAMIDOL (ISOVUE-370) INJECTION 76% COMPARISON:  08/27/2018 CT head. FINDINGS: CTA NECK FINDINGS Aortic arch: Standard branching. Imaged portion shows no evidence of aneurysm or dissection. No significant stenosis of the major arch vessel origins. Moderate mixed plaque of the arch. Right carotid system: No evidence of dissection, stenosis (50% or greater) or occlusion. Right carotid bifurcation calcified plaque with mild less than 50% proximal ICA stenosis. Left carotid system: No evidence of dissection, stenosis (50% or greater) or occlusion. Non stenotic calcified plaque of the carotid bifurcation. Vertebral arteries: Calcified plaque of right vertebral artery origin with mild less than 50% stenosis. Left dominant vertebral arteries. No additional segment of significant stenosis, dissection, or aneurysm. Skeleton: Moderate spondylosis of the cervical spine with degenerative changes greatest at the C5-C7 levels. No high-grade bony canal stenosis. Other neck: Negative. Upper chest: Small bilateral pleural effusions. Moderate to severe emphysema. Review of the MIP images confirms the above findings CTA HEAD FINDINGS Anterior circulation: Right M2 inferior division proximal occlusion with good reconstitution of the vessel in distal collateralization. Right M2 inferior division thrombus approximately 13 mm in length (series 9, image 25). Nonocclusive clot appears to  extend into the distal right M1. The right M2 superior division is widely patent. Calcific atherosclerosis of carotid siphons with mild right and moderate left distal cavernous and paraclinoid stenosis. Posterior circulation: No significant stenosis, proximal occlusion, aneurysm, or vascular malformation. Venous sinuses: As permitted by contrast timing, patent. Anatomic variants: None significant. Review of the MIP images confirms the above findings CT Brain Perfusion Findings: CBF (<30%) Volume: 38mL Perfusion (Tmax>6.0s) volume: 19mL, right parietal white matter. Mismatch Volume: 43mL Infarction Location:No acute stroke by perfusion criteria. IMPRESSION: 1. Right M2 inferior division origin occlusion and nonocclusive thrombus of distal right M1. Right M2 thrombus approximately 13 mm in length. Good downstream collateralization. 2. Negative CT brain perfusion for acute stroke by perfusion criteria. Mild asymmetry of transit time, increased in the right posterior cerebral hemisphere, likely related to M2 thrombus. 3. Patent carotid and vertebral arteries of the neck. No dissection, aneurysm, or significant stenosis by NASCET criteria. 4. No additional intracranial large vessel occlusion, aneurysm, or vascular malformation. 5. Mild right vertebral artery origin stenosis with calcified plaque. 6. Mild right proximal ICA stenosis with calcified plaque. 7. Mild right and moderate left cavernous and paraclinoid ICA stenosis with calcified plaque. These results were called by telephone at the time of interpretation on 08/28/2018 at 12:01 am to Dr. Darrick Meigs , who verbally acknowledged these results. Electronically Signed   By: Kristine Garbe M.D.   On: 08/28/2018 00:07   Mr Brain Wo Contrast  Result Date: 08/28/2018 CLINICAL DATA:  Follow-up stroke with intervention. EXAM: MRI HEAD WITHOUT CONTRAST TECHNIQUE: Multiplanar, multiecho pulse sequences of the brain and surrounding structures were obtained without  intravenous contrast. COMPARISON:  Multiple examinations same day. FINDINGS: Brain: Diffusion imaging shows multiple acute infarctions in the right middle cerebral artery territory. There is acute infarction affecting the right corpus striatum. Petechial blood products are present within this region of infarction. There is a small cortical infarction of the right lateral temporal lobe without hemorrhage. There are small cortical infarctions scattered  within the right parietal region. Petechial blood products present associated with the posterior parietal cortical infarctions. No swelling or mass effect. Chronic small-vessel ischemic changes are present elsewhere throughout the cerebral hemispheric white matter. Vascular: Major vessels at the base of the brain show flow. Skull and upper cervical spine: Negative Sinuses/Orbits: Clear/normal Other: None IMPRESSION: Areas of acute infarction within the right middle cerebral artery territory as outlined above. Largest region affects the corpus striatum of the basal ganglia on the right. Petechial blood products present in that location. Scattered other few small cortical infarctions within the temporal and parietal lobes. Minimal petechial blood products associated with the small posterior parietal cortical infarctions. Electronically Signed   By: Nelson Chimes M.D.   On: 08/28/2018 15:41   [place]  Result Date: 08/29/2018 INDICATION: New onset of right gaze deviation, left-sided hemiparesis. Dysarthria. CT angiogram of the head and neck revealing thrombus in the distal right middle cerebral artery extending into codominant inferior division of the right middle cerebral artery. EXAM: 1. EMERGENT LARGE VESSEL OCCLUSION THROMBOLYSIS (anterior CIRCULATION) COMPARISON:  CT angiogram of the head of 08/27/2018. MEDICATIONS: Ancef 2 g IV antibiotic was administered within 1 hour of the procedure. ANESTHESIA/SEDATION: General anesthesia. CONTRAST:  Isovue 300  approximately 50 mL. FLUOROSCOPY TIME:  Fluoroscopy Time: 39 minutes 12 seconds (1839 mGy). COMPLICATIONS: None immediate. TECHNIQUE: Following a full explanation of the procedure along with the potential associated complications, an informed witnessed consent was obtained from the patient's sister. The risks of intracranial hemorrhage of 10%, worsening neurological deficit, ventilator dependency, death and inability to revascularize were all reviewed in detail with the patient's sister. The patient was then put under general anesthesia by the Department of Anesthesiology at [facility]. The right groin was prepped and draped in the usual sterile fashion. Thereafter using modified Seldinger technique, transfemoral access into the right common femoral artery was obtained without difficulty. Over a 0.035 inch guidewire a 5 French Pinnacle sheath was inserted. Through this, and also over a 0.035 inch guidewire a 5 [place] JB 1 catheter was advanced to the aortic arch region and selectively positioned in the right common carotid artery. FINDINGS: The right common carotid arteriogram demonstrates the right external carotid artery and its major branches to be widely patent. The right internal carotid artery at the bulb has a mild circumferential plaque. No evidence of ulcerations or occlusions seen. The right internal carotid artery is, otherwise, seen to opacify to the cranial skull base with moderate areas of tortuosity in the mid to distal cervical regions. The petrous, cavernous and supraclinoid segments are widely patent. The right anterior cerebral artery and the right middle cerebral artery opacify into the capillary and venous phases. The right middle cerebral artery and its distal 1/3 and extending into the codominant inferior division of the right middle cerebral artery demonstrates a permeative thrombus with stagnation and slow flow. Also demonstrated is prompt cross filling via the anterior  communicating artery of the left anterior cerebral artery A2 segment and distally. PROCEDURE: The diagnostic JB 1 catheter in the right common carotid artery was exchanged over a 0.035 inch 300 cm Rosen exchange guidewire for an 8 French 55 cm Brite tip neurovascular sheath using biplane roadmap technique and constant fluoroscopic guidance. Good aspiration was obtained from side port of the neurovascular sheath. This was then connected to continuous heparinized saline infusion. Over the [facility] exchange guidewire, an 85 cm 8 [place] FlowGate guide catheter which had been prepped with 50% contrast and  50% heparinized saline infusion was advanced and positioned just proximal to the right common carotid bifurcation. The guidewire was removed. Good aspiration obtained from the hub of the [facility] guide catheter. Gentle contrast injection demonstrated no evidence of spasms, dissections or of intraluminal filling defects. Over a 0.014 inch Softip Synchro micro guidewire, a combination of a Trevo ProVue 021 microcatheter inside of a 5 French 115 cm Catalyst guide catheter was advanced into the horizontal petrous segment of the right internal carotid artery. With the micro guidewire leading with a J-tip configuration, the combination was navigated to the supraclinoid right ICA. The micro guidewire was then gently manipulated using a torque device and advanced into the right middle cerebral artery and the distal right M1 segment in the codominant inferior division into the M2 M3 regions followed by the microcatheter. The guidewire was removed. Good aspiration obtained from the hub of the microcatheter. A gentle contrast injection demonstrated brisk antegrade flow into the M3 M4 regions. This was then connected to continuous heparinized saline infusion. A 5 mm x 33 mm Embotrap retrieval device was then advanced in a coaxial manner with constant heparinized saline infusion to the distal end of the microcatheter. The proximal and  the distal landing zones were then defined. The O ring on the delivery microcatheter was loosened. With slight forward gentle traction with the right hand on the delivery micro guidewire, with the left hand the delivery microcatheter was retrieved from distal to proximal aspect. A control arteriogram performed through the 5 [place] Catalyst guide catheter in the right internal carotid artery supraclinoid segment demonstrated a revascularization of the right middle cerebral artery distribution. With proximal flow arrest initiated by inflating the balloon of the [place] guide catheter in the right internal carotid artery, constant aspiration was applied with a 60 mL syringe at the hub of the 5 [place] Catalyst guide catheter which had now been advanced to the tip of the proximal marker of the retrieval device. As constant aspiration was then applied using a 60 mL syringe at the hub of the [facility] guide catheter in the right internal carotid artery, and at the hub of the 5 [place] Catalyst guide catheter using a Penumbra vacuum aspiration device. This combination was then retrieved and removed as constant aspiration was applied as above. Aspiration was continued with a 60 mL syringe at the hub of the [facility] guide catheter as proximal flow arrest was reversed by deflating the balloon in the right internal carotid artery. There was a prominent approximately 3.5 mm x 1.5 mm string like clot embedded in the interstices of the retrieval device. The aspirate also contained two specks of clot. Back bleed was now at the hub of the [facility] guide catheter. A control arteriogram performed through the [facility] guide catheter in the right internal carotid artery demonstrated complete revascularization of the right internal carotid artery, and the right middle cerebral artery. A TICI 3 revascularization had been achieved. No evidence of intraluminal filling defects or of occlusions, or of dissection was noted. No mass effect,  midline shift or of extravasation of contrast was noted. The 8 [place] FlowGate guide catheter, and the 8 [place] neurovascular sheath were then retrieved into the abdominal aorta and exchanged over a J-tip guidewire for an 8 [place] Pinnacle sheath. This in turn was then removed with the successful closure of the common femoral puncture site using a 7 [place] Exo-Seal closure device. The right groin appeared soft without evidence of hematoma or bleeding. Distal pulses are were Dopplerable in  the dorsalis pedis, and the posterior tibial regions bilaterally unchanged from prior to the procedure. The patient underwent a CT of the brain which revealed no evidence of gross hemorrhages, or mass effect or midline shift. The patient was then extubated without difficulty. Upon recovery, the patient was slow in waking up from the general anesthetic. He was then returned to the PACU and then to neuro ICU to continue with post thrombectomy care and management. IMPRESSION: Status post endovascular complete revascularization of nearly completely occlusive right middle cerebral artery distal M1 segment, extending into the codominant inferior division of the right middle cerebral artery proximally with 1 pass of 5 mm x 33 mm Embotrap retrieval device achieving a TICI 3 revascularization. PLAN: Patient will be followed in the clinic 4 weeks post discharge. Electronically Signed   By: Luanne Bras M.D.   On: 08/28/2018 14:06   Ct Cerebral Perfusion W Contrast  Result Date: 08/28/2018 CLINICAL DATA:  66 y/o  M; code stroke for follow-up. EXAM: CT PERFUSION BRAIN TECHNIQUE: Multiphase CT imaging of the brain was performed following IV bolus contrast injection. Subsequent parametric perfusion maps were calculated using RAPID software. CONTRAST:  40 cc Isovue 370 COMPARISON:  08/27/2018 CTA head, CT head, CT perfusion head. 08/28/2018 CT head. FINDINGS: CT Brain Perfusion Findings: CBF (<30%) Volume: 43mL Perfusion (Tmax>6.0s)  volume: 51mL Mismatch Volume: 23mL ASPECTS on noncontrast CT Head: 9 at 151 hours today. Infarct Core: Approximately 5 cc mL Infarction Location:Right lentiform nucleus. IMPRESSION: 1. Right lentiform nucleus infarction, ASPECTS 9 on noncontrast CT of head. Pseudo normalized perfusion associated with this area visible infarction. 2. No additional region of acute infarction by perfusion criteria identified. 3. Mildly increased transit time in the right posterior cerebral hemisphere likely related collateralization of right M2 inferior division occlusion. Electronically Signed   By: Kristine Garbe M.D.   On: 08/28/2018 02:21   Ct Cerebral Perfusion W Contrast  Result Date: 08/28/2018 CLINICAL DATA:  67 y/o M; code stroke, left facial droop and altered mental status. EXAM: CT ANGIOGRAPHY HEAD AND NECK CT PERFUSION BRAIN TECHNIQUE: Multidetector CT imaging of the head and neck was performed using the standard protocol during bolus administration of intravenous contrast. Multiplanar CT image reconstructions and MIPs were obtained to evaluate the vascular anatomy. Carotid stenosis measurements (when applicable) are obtained utilizing NASCET criteria, using the distal internal carotid diameter as the denominator. Multiphase CT imaging of the brain was performed following IV bolus contrast injection. Subsequent parametric perfusion maps were calculated using RAPID software. CONTRAST:  175mL ISOVUE-370 IOPAMIDOL (ISOVUE-370) INJECTION 76% COMPARISON:  08/27/2018 CT head. FINDINGS: CTA NECK FINDINGS Aortic arch: Standard branching. Imaged portion shows no evidence of aneurysm or dissection. No significant stenosis of the major arch vessel origins. Moderate mixed plaque of the arch. Right carotid system: No evidence of dissection, stenosis (50% or greater) or occlusion. Right carotid bifurcation calcified plaque with mild less than 50% proximal ICA stenosis. Left carotid system: No evidence of dissection, stenosis  (50% or greater) or occlusion. Non stenotic calcified plaque of the carotid bifurcation. Vertebral arteries: Calcified plaque of right vertebral artery origin with mild less than 50% stenosis. Left dominant vertebral arteries. No additional segment of significant stenosis, dissection, or aneurysm. Skeleton: Moderate spondylosis of the cervical spine with degenerative changes greatest at the C5-C7 levels. No high-grade bony canal stenosis. Other neck: Negative. Upper chest: Small bilateral pleural effusions. Moderate to severe emphysema. Review of the MIP images confirms the above findings CTA HEAD FINDINGS Anterior circulation: Right  M2 inferior division proximal occlusion with good reconstitution of the vessel in distal collateralization. Right M2 inferior division thrombus approximately 13 mm in length (series 9, image 25). Nonocclusive clot appears to extend into the distal right M1. The right M2 superior division is widely patent. Calcific atherosclerosis of carotid siphons with mild right and moderate left distal cavernous and paraclinoid stenosis. Posterior circulation: No significant stenosis, proximal occlusion, aneurysm, or vascular malformation. Venous sinuses: As permitted by contrast timing, patent. Anatomic variants: None significant. Review of the MIP images confirms the above findings CT Brain Perfusion Findings: CBF (<30%) Volume: 99mL Perfusion (Tmax>6.0s) volume: 25mL, right parietal white matter. Mismatch Volume: 12mL Infarction Location:No acute stroke by perfusion criteria. IMPRESSION: 1. Right M2 inferior division origin occlusion and nonocclusive thrombus of distal right M1. Right M2 thrombus approximately 13 mm in length. Good downstream collateralization. 2. Negative CT brain perfusion for acute stroke by perfusion criteria. Mild asymmetry of transit time, increased in the right posterior cerebral hemisphere, likely related to M2 thrombus. 3. Patent carotid and vertebral arteries of the neck.  No dissection, aneurysm, or significant stenosis by NASCET criteria. 4. No additional intracranial large vessel occlusion, aneurysm, or vascular malformation. 5. Mild right vertebral artery origin stenosis with calcified plaque. 6. Mild right proximal ICA stenosis with calcified plaque. 7. Mild right and moderate left cavernous and paraclinoid ICA stenosis with calcified plaque. These results were called by telephone at the time of interpretation on 08/28/2018 at 12:01 am to Dr. Darrick Meigs , who verbally acknowledged these results. Electronically Signed   By: Kristine Garbe M.D.   On: 08/28/2018 00:07   Dg Chest Port 1 View  Result Date: 08/30/2018 CLINICAL DATA:  Acute respiratory failure EXAM: PORTABLE CHEST 1 VIEW COMPARISON:  August 29, 2018 FINDINGS: The ETT is in good position. The enteric tube terminates in the distal stomach. The cardiomediastinal silhouette is stable. No acute infiltrate. IMPRESSION: Support apparatus as above.  No acute interval change. Electronically Signed   By: Dorise Bullion III M.D   On: 08/30/2018 08:08   Dg Chest Port 1 View  Result Date: 08/29/2018 CLINICAL DATA:  Endotracheal tube placement. EXAM: PORTABLE CHEST 1 VIEW COMPARISON:  Radiograph of August 28, 2018. FINDINGS: Stable cardiomediastinal silhouette. Endotracheal and nasogastric tubes are unchanged in position. No pneumothorax or pleural effusion is noted. Minimal bibasilar subsegmental atelectasis is noted. Possible developing left upper lobe atelectasis or pneumonia. Bony thorax is unremarkable. IMPRESSION: Stable support apparatus. Minimal bibasilar subsegmental atelectasis. Possible developing left upper lobe opacity concerning for atelectasis or pneumonia. Electronically Signed   By: Marijo Conception, M.D.   On: 08/29/2018 08:17   Dg Chest Portable 1 View  Result Date: 08/28/2018 CLINICAL DATA:  Intubation EXAM: PORTABLE CHEST 1 VIEW COMPARISON:  08/24/2018 FINDINGS: Endotracheal tube tip  between the clavicular heads and carina. An orogastric tube reaches the stomach. Cardiomegaly accentuated by low volumes. Streaky opacity at the bases and generalized interstitial coarsening. No Kerley lines, large effusion, or pneumothorax. IMPRESSION: *Unremarkable hardware positioning. *Cardiomegaly and atelectasis. Electronically Signed   By: Monte Fantasia M.D.   On: 08/28/2018 07:17   Dg Abd Portable 1v  Result Date: 08/28/2018 CLINICAL DATA:  Orogastric tube placement. EXAM: PORTABLE ABDOMEN - 1 VIEW COMPARISON:  08/24/2018. FINDINGS: Orogastric tube noted with tip over the distal stomach. No bowel distention. No free air. Bibasilar atelectasis/infiltrates. IMPRESSION: 1. Orogastric tube noted with tip over distal stomach. No evidence of gastric distention. 2.  Bibasilar atelectasis/infiltrates. Electronically Signed   By:  [place]   On: 08/28/2018 07:18   Ir Percutaneous Art Thrombectomy/infusion Intracranial Inc Diag Angio  Result Date: 08/29/2018 INDICATION: New onset of right gaze deviation, left-sided hemiparesis. Dysarthria. CT angiogram of the head and neck revealing thrombus in the distal right middle cerebral artery extending into codominant inferior division of the right middle cerebral artery. EXAM: 1. EMERGENT LARGE VESSEL OCCLUSION THROMBOLYSIS (anterior CIRCULATION) COMPARISON:  CT angiogram of the head of 08/27/2018. MEDICATIONS: Ancef 2 g IV antibiotic was administered within 1 hour of the procedure. ANESTHESIA/SEDATION: General anesthesia. CONTRAST:  Isovue 300 approximately 50 mL. FLUOROSCOPY TIME:  Fluoroscopy Time: 39 minutes 12 seconds (1839 mGy). COMPLICATIONS: None immediate. TECHNIQUE: Following a full explanation of the procedure along with the potential associated complications, an informed witnessed consent was obtained from the patient's sister. The risks of intracranial hemorrhage of 10%, worsening neurological deficit, ventilator dependency, death and inability  to revascularize were all reviewed in detail with the patient's sister. The patient was then put under general anesthesia by the Department of Anesthesiology at [facility]. The right groin was prepped and draped in the usual sterile fashion. Thereafter using modified Seldinger technique, transfemoral access into the right common femoral artery was obtained without difficulty. Over a 0.035 inch guidewire a 5 French Pinnacle sheath was inserted. Through this, and also over a 0.035 inch guidewire a 5 [place] JB 1 catheter was advanced to the aortic arch region and selectively positioned in the right common carotid artery. FINDINGS: The right common carotid arteriogram demonstrates the right external carotid artery and its major branches to be widely patent. The right internal carotid artery at the bulb has a mild circumferential plaque. No evidence of ulcerations or occlusions seen. The right internal carotid artery is, otherwise, seen to opacify to the cranial skull base with moderate areas of tortuosity in the mid to distal cervical regions. The petrous, cavernous and supraclinoid segments are widely patent. The right anterior cerebral artery and the right middle cerebral artery opacify into the capillary and venous phases. The right middle cerebral artery and its distal 1/3 and extending into the codominant inferior division of the right middle cerebral artery demonstrates a permeative thrombus with stagnation and slow flow. Also demonstrated is prompt cross filling via the anterior communicating artery of the left anterior cerebral artery A2 segment and distally. PROCEDURE: The diagnostic JB 1 catheter in the right common carotid artery was exchanged over a 0.035 inch 300 cm Rosen exchange guidewire for an 8 French 55 cm Brite tip neurovascular sheath using biplane roadmap technique and constant fluoroscopic guidance. Good aspiration was obtained from side port of the neurovascular sheath. This was then  connected to continuous heparinized saline infusion. Over the [facility] exchange guidewire, an 85 cm 8 [place] FlowGate guide catheter which had been prepped with 50% contrast and 50% heparinized saline infusion was advanced and positioned just proximal to the right common carotid bifurcation. The guidewire was removed. Good aspiration obtained from the hub of the [facility] guide catheter. Gentle contrast injection demonstrated no evidence of spasms, dissections or of intraluminal filling defects. Over a 0.014 inch Softip Synchro micro guidewire, a combination of a Trevo ProVue 021 microcatheter inside of a 5 French 115 cm Catalyst guide catheter was advanced into the horizontal petrous segment of the right internal carotid artery. With the micro guidewire leading with a J-tip configuration, the combination was navigated to the supraclinoid right ICA. The micro guidewire was then gently manipulated using a torque device and advanced into  the right middle cerebral artery and the distal right M1 segment in the codominant inferior division into the M2 M3 regions followed by the microcatheter. The guidewire was removed. Good aspiration obtained from the hub of the microcatheter. A gentle contrast injection demonstrated brisk antegrade flow into the M3 M4 regions. This was then connected to continuous heparinized saline infusion. A 5 mm x 33 mm Embotrap retrieval device was then advanced in a coaxial manner with constant heparinized saline infusion to the distal end of the microcatheter. The proximal and the distal landing zones were then defined. The O ring on the delivery microcatheter was loosened. With slight forward gentle traction with the right hand on the delivery micro guidewire, with the left hand the delivery microcatheter was retrieved from distal to proximal aspect. A control arteriogram performed through the 5 [place] Catalyst guide catheter in the right internal carotid artery supraclinoid segment demonstrated a  revascularization of the right middle cerebral artery distribution. With proximal flow arrest initiated by inflating the balloon of the [facility] guide catheter in the right internal carotid artery, constant aspiration was applied with a 60 mL syringe at the hub of the 5 [place] Catalyst guide catheter which had now been advanced to the tip of the proximal marker of the retrieval device. As constant aspiration was then applied using a 60 mL syringe at the hub of the [facility] guide catheter in the right internal carotid artery, and at the hub of the 5 [place] Catalyst guide catheter using a Penumbra vacuum aspiration device. This combination was then retrieved and removed as constant aspiration was applied as above. Aspiration was continued with a 60 mL syringe at the hub of the [facility] guide catheter as proximal flow arrest was reversed by deflating the balloon in the right internal carotid artery. There was a prominent approximately 3.5 mm x 1.5 mm string like clot embedded in the interstices of the retrieval device. The aspirate also contained two specks of clot. Back bleed was now at the hub of the [facility] guide catheter. A control arteriogram performed through the [facility] guide catheter in the right internal carotid artery demonstrated complete revascularization of the right internal carotid artery, and the right middle cerebral artery. A TICI 3 revascularization had been achieved. No evidence of intraluminal filling defects or of occlusions, or of dissection was noted. No mass effect, midline shift or of extravasation of contrast was noted. The 8 [place] FlowGate guide catheter, and the 8 [place] neurovascular sheath were then retrieved into the abdominal aorta and exchanged over a J-tip guidewire for an 8 [place] Pinnacle sheath. This in turn was then removed with the successful closure of the common femoral puncture site using a 7 [place] Exo-Seal closure device. The right groin appeared soft without evidence of  hematoma or bleeding. Distal pulses are were Dopplerable in the dorsalis pedis, and the posterior tibial regions bilaterally unchanged from prior to the procedure. The patient underwent a CT of the brain which revealed no evidence of gross hemorrhages, or mass effect or midline shift. The patient was then extubated without difficulty. Upon recovery, the patient was slow in waking up from the general anesthetic. He was then returned to the PACU and then to neuro ICU to continue with post thrombectomy care and management. IMPRESSION: Status post endovascular complete revascularization of nearly completely occlusive right middle cerebral artery distal M1 segment, extending into the codominant inferior division of the right middle cerebral artery proximally with 1 pass of 5 mm x 33 mm Embotrap  retrieval device achieving a TICI 3 revascularization. PLAN: Patient will be followed in the clinic 4 weeks post discharge. Electronically Signed   By: Luanne Bras M.D.   On: 08/28/2018 14:06   Ct Head Code Stroke Wo Contrast  Result Date: 08/28/2018 CLINICAL DATA:  Code stroke. EXAM: CT HEAD WITHOUT CONTRAST TECHNIQUE: Contiguous axial images were obtained from the base of the skull through the vertex without intravenous contrast. COMPARISON:  08/27/2018 CT head and CTA head. FINDINGS: Brain: Area of hypoattenuation spanning approximately 3.4 x 1.0 x 2.8 cm (volume = 5 cm^3) centered within the right lentiform nucleus (series 3, image 17 and series 5, image 35) likely representing an area of acute or subacute infarction. No additional region of stroke or hemorrhage is identified at this time. Stable background of chronic microvascular ischemic changes and volume loss of the brain. Vascular: Calcific atherosclerosis of the carotid siphons. Skull: Normal. Negative for fracture or focal lesion. Sinuses/Orbits: No acute finding. Other: None. ASPECTS [facility] Stroke Program Early CT Score) - Ganglionic level infarction  (caudate, lentiform nuclei, internal capsule, insula, M1-M3 cortex): 6 - Supraganglionic infarction (M4-M6 cortex): 3 Total score (0-10 with 10 being normal): 9 IMPRESSION: 1. Hypoattenuation within the right lentiform nucleus spanning up to approximately 3.4 cm (5 cc) likely representing an acute or subacute infarction. No associated hemorrhage or mass effect. Increased conspicuity from prior CT of head. 2. ASPECTS is 9 These results were called by telephone at the time of interpretation on 08/28/2018 at 2:02 am to Dr. Roland Rack , who verbally acknowledged these results. Electronically Signed   By: Kristine Garbe M.D.   On: 08/28/2018 02:07   Ct Head Code Stroke Wo Contrast`  Result Date: 08/27/2018 CLINICAL DATA:  Code stroke. Left facial droop and altered mental status EXAM: CT HEAD WITHOUT CONTRAST TECHNIQUE: Contiguous axial images were obtained from the base of the skull through the vertex without intravenous contrast. COMPARISON:  None. FINDINGS: Brain: There is no mass, hemorrhage or extra-axial collection. The size and configuration of the ventricles and extra-axial CSF spaces are normal. There is hypoattenuation of the periventricular white matter, most commonly indicating chronic ischemic microangiopathy. Vascular: No abnormal hyperdensity of the major intracranial arteries or dural venous sinuses. No intracranial atherosclerosis. Skull: The visualized skull base, calvarium and extracranial soft tissues are normal. Sinuses/Orbits: No fluid levels or advanced mucosal thickening of the visualized paranasal sinuses. No mastoid or middle ear effusion. The orbits are normal. ASPECTS [facility] Stroke Program Early CT Score) - Ganglionic level infarction (caudate, lentiform nuclei, internal capsule, insula, M1-M3 cortex): 7 - Supraganglionic infarction (M4-M6 cortex): 3 Total score (0-10 with 10 being normal): 10 IMPRESSION: 1. No acute hemorrhage. 2. ASPECTS is 10. These results were  called by telephone at the time of interpretation on 08/27/2018 at 9:34 pm to Dr. Eleonore Chiquito , who verbally acknowledged these results. Electronically Signed   By: Ulyses Jarred M.D.   On: 08/27/2018 21:32    Labs:  CBC: Recent Labs    08/27/18 0403 08/28/18 0559 08/28/18 1002 08/29/18 0402 08/30/18 0519  WBC 8.2  --  5.9 6.4 8.3  HGB 12.2* 13.6 11.3* 11.7* 11.9*  HCT 40.8 40.0 38.9* 39.0 38.4*  PLT 193  --  190 180 177    COAGS: Recent Labs    08/24/18 1644 08/27/18 2111  INR  --  1.28  APTT 169* 33    BMP: Recent Labs    08/27/18 1136 08/28/18 0559 08/28/18 1002 08/29/18 0402 08/30/18 4196  NA 133* 138 137 137 137  K 5.7* 5.6* 5.5* 5.1 5.1  CL 103  --  104 103 103  CO2 24  --  25 27 25   GLUCOSE 217*  --  138* 236* 241*  BUN 38*  --  28* 30* 33*  CALCIUM 8.9  --  9.0 9.0 8.9  CREATININE 0.89  --  0.70 0.72 0.76  GFRNONAA >60  --  >60 >60 >60  GFRAA >60  --  >60 >60 >60    LIVER FUNCTION TESTS: Recent Labs    08/24/18 1137 08/25/18 0407 08/26/18 0409 08/27/18 0403  BILITOT 3.1* 2.3* 2.1* 1.8*  AST 484* 281* 208* 190*  ALT 349* 308* 351* 375*  ALKPHOS 141* 116 116 105  PROT 8.7* 7.3 7.7 7.2  ALBUMIN 4.1 3.4* 3.5 3.4*     A/P: CVA, status post revascularization of occluded distal right MCA on 12/13; access site right groin stable, CT head yesterday with evolving acute right basal ganglia nonhemorrhagic infarct with petechial hemorrhage as well as additional small acute infarcts, acute pontine lacunar infarct versus artifact; plans as per neurology     I spent a total of 15 minutes at the the patient's bedside AND on the patient's hospital floor or unit, greater than 50% of which was counseling/coordinating care for cerebral arteriogram with endovascular intervention    Patient ID: Clayton [name], male   DOB: 09-25-1950, 67 y.o.   MRN: [id number]

## 2018-08-30 NOTE — Progress Notes (Signed)
NAME:  Clayton [name], MRN:  [id number], DOB:  November 08, 1950, LOS: 6 ADMISSION DATE:  08/24/2018, CONSULTATION DATE:  08/28/2018 REFERRING MD:  Dr. Leonel Ramsay, CHIEF COMPLAINT:  Stroke  Brief History   73 yoM originally admitted to [facility] on 12/9 with AECOPD, AKI, dehydration s/p N/V for several days found with acute left sided deficits.  Code stroke initiated.  Found to have acute Right M2 occlusion.  TPA not given.  Transferred emergently to [facility] and taken to Neuro IR.  PCCM consulted for vent management.  History of present illness   HPI obtained from medical chart review as patient is currently intubated and sedated on mechanical ventilation.  67 year old male with history of tobacco abuse, COPD, chronic hypoxic respiratory failure on home O2 2Ls, HTN, HLD, DM, CAD w/prior MI, GERD, and prostate CA initially admitted to [facility] on 12/9 after being found on floor.  Patient complained of progressive SOB x 2 weeks, 1 week history of nausea, vomiting, and diarrhea with poor PO intake, dizziness, and generalized weakness.    Admitted for AECOPD, hypoxia, dehydration, AKI, elevated LFTs, and troponin.  Initial CXR and head CT negative.  Evaluated by Cardiology and empirically treated with heparin until 12/10 after TTE did not show any wall motion abnormality and elevation in troponin thought due to demand ischemia.  He has been treated with steroids, nebs, azithro and HFNC weaned down to 5L [place] as of yesterday. He has been afebrile and normotensive. Negative GI workup thus far (-stool, -RUQ [place], -hepatitis panel).  AKI resolved after IV hydration and transaminitis is improving.   LSW around shift change 1900 on 12/12.  After 2030, found to have left facial droop, aphasia, and left sided weakness.  Code stroke activated and assessed by tele Neuro.  NIHSS 16. CT head negative.  Decision made not to give TPA due to elevated PT/INR 15.9/ 1.28.  CTA head/ neck noted for acute right MCA occlusion.    Patient was emergently transferred to [facility] and taken to neuro IR for embolectomy.  Intubated for procedure and will return to ICU on ventilator.  Extubation deferred given ongoing AECOPD.  Therefore, PCCM consulted for further medical and ventilator management in ICU.  Past Medical History   tobacco abuse, COPD, chronic hypoxic respiratory failure on home O2 2Ls, HTN, HLD, DM, CAD w/prior MI, GERD, and prostate [place] Hospital Events   12/9 Admit at [facility] 12/13 tx to [facility]  Consults:   Procedures:  12/13 cerebral angiogram  12/13 ETT >>  Significant Diagnostic Tests:  12/9 [facility]  >> neg 12/10 TTE >> EF 50-55%, G1DD, mild AS, mod TR, PAP 57 mmHg 12/12 CTH >> neg; ASPECTS 10  12/12 CTA head/neck >> 1. Right M2 inferior division origin occlusion and nonocclusive thrombus of distal right M1. Right M2 thrombus approximately 13 mm in length. Good downstream collateralization. 2. Negative CT brain perfusion for acute stroke by perfusion criteria. Mild asymmetry of transit time, increased in the right posterior cerebral hemisphere, likely related to M2 thrombus. 3. Patent carotid and vertebral arteries of the neck. No dissection, aneurysm, or significant stenosis by NASCET criteria. 4. No additional intracranial large vessel occlusion, aneurysm, or vascular malformation. 5. Mild right vertebral artery origin stenosis with calcified plaque. 6. Mild right proximal ICA stenosis with calcified plaque. 7. Mild right and moderate left cavernous and paraclinoid ICA stenosis with calcified plaque.  12/13 CTH >> 1. Hypoattenuation within the right lentiform nucleus spanning up to approximately 3.4  cm (5 cc) likely representing an acute or subacute infarction. No associated hemorrhage or mass effect. Increased conspicuity from prior CT of head. 2. ASPECTS is 9  12/13 CT cerebral perfusion >>  1. Right lentiform nucleus infarction, ASPECTS 9 on noncontrast CT of head. Pseudo normalized  perfusion associated with this area visible infarction. 2. No additional region of acute infarction by perfusion criteria identified. 3. Mildly increased transit time in the right posterior cerebral hemisphere likely related collateralization of right M2 inferior division occlusion.  Micro Data:  12/10 MRSA PCR  >> neg 12/10 Cdiff  >> neg 12/10 GI panel >> neg  Antimicrobials:  12/9 azithro >> 12/13 ancef pre op  Interim history/subjective:  Remains on 70% FiO2, PEEP 12 On low-dose propofol currently  Objective   Blood pressure 134/69, pulse 66, temperature 98.1 F (36.7 C), resp. rate 16, height 6' (1.829 m), weight 96.6 kg, SpO2 97 %.    Vent Mode: PRVC FiO2 (%):  [70 %-80 %] 70 % Set Rate:  [16 bmp] 16 bmp Vt Set:  [620 mL] 620 mL PEEP:  [12 cmH20] 12 cmH20 Plateau Pressure:  [15 cmH20-24 cmH20] 24 cmH20   Intake/Output Summary (Last 24 hours) at 08/30/2018 1412 Last data filed at 08/30/2018 1200 Gross per 24 hour  Intake 1991 ml  Output 1980 ml  Net 11 ml   Filed Weights   08/27/18 2200 08/29/18 0500 08/30/18 0458  Weight: 95.5 kg 96 kg 96.6 kg    Examination: General: Chronically ill man, ventilated, comfortable HENT: ET tube in place, pupils equal, no oral lesions Lungs: Distant but clear bilaterally.  No wheezing Cardiovascular: Regular, no murmur Abdomen:, Nondistended with positive bowel sounds Extremities: No significant edema Neuro: Nodded to questions, follows simple commands, moves both upper extremities Skin: No rash  Resolved Hospital Problem list    Assessment & Plan:  Right MCA stroke s/p EVR and complete revascularization, was extubated in the PACU post op but mental status was poor, unable to protect his airway and was reintubated.  Acute right MCA embolic stroke, question atherosclerosis versus cardioembolic versus paradoxical emboli.  Status post thrombectomy.  Some petechial hemorrhage at the right basal ganglia Okay for aspirin per  neurology  Acute respiratory failure with hypoxemia: Continues to require higher PEEP and FiO2 than I would expect based on his chest x-ray or any evidence for obstruction.  He is not wheezing on exam.  I think he needs a CT scan of his chest to evaluate for possible problem embolic disease.  COPD exacerbation: Continue to wean corticosteroids in absence of wheezing Continue nebulized bronchodilators as ordered  Hyperglycemia Sliding scale insulin Lantus 25  History CAD and PTCI Aspirin as above  HTN:  Continue carvedilol Cleviprex weaned to off   Hyperkalemia: Follow serial BMP  Nutrition Tube feedings initiated   Labs   CBC: Recent Labs  Lab 08/24/18 1137  08/26/18 0409 08/27/18 0403 08/28/18 0559 08/28/18 1002 08/29/18 0402 08/30/18 0519  WBC 5.7   < > 7.6 8.2  --  5.9 6.4 8.3  NEUTROABS 4.1  --   --   --   --  5.0  --   --   HGB 12.5*   < > 12.0* 12.2* 13.6 11.3* 11.7* 11.9*  HCT 41.1   < > 39.1 40.8 40.0 38.9* 39.0 38.4*  MCV 94.3   < > 95.8 98.3  --  95.3 94.2 92.8  PLT 213   < > 213 193  --  190 180  177   < > = values in this interval not displayed.    Basic Metabolic Panel: Recent Labs  Lab 08/27/18 0403 08/27/18 1136 08/28/18 0559 08/28/18 1002 08/29/18 0402 08/29/18 1638 08/30/18 0519  NA 136 133* 138 137 137  --  137  K 5.9* 5.7* 5.6* 5.5* 5.1  --  5.1  CL 105 103  --  104 103  --  103  CO2 25 24  --  25 27  --  25  GLUCOSE 220* 217*  --  138* 236*  --  241*  BUN 39* 38*  --  28* 30*  --  33*  CREATININE 0.88 0.89  --  0.70 0.72  --  0.76  CALCIUM 9.0 8.9  --  9.0 9.0  --  8.9  MG  --   --   --  1.5* 1.7 1.5* 1.6*  PHOS  --   --   --  3.1 3.5 2.9 3.0   GFR: Estimated Creatinine Clearance: 109.5 mL/min (by C-G formula based on SCr of 0.76 mg/dL). Recent Labs  Lab 08/24/18 1145 08/24/18 1258  08/27/18 0403 08/28/18 1002 08/29/18 0402 08/30/18 0519  WBC  --   --    < > 8.2 5.9 6.4 8.3  LATICACIDVEN 2.42* 2.58*  --   --   --   --    --    < > = values in this interval not displayed.    Liver Function Tests: Recent Labs  Lab 08/24/18 1137 08/25/18 0407 08/26/18 0409 08/27/18 0403  AST 484* 281* 208* 190*  ALT 349* 308* 351* 375*  ALKPHOS 141* 116 116 105  BILITOT 3.1* 2.3* 2.1* 1.8*  PROT 8.7* 7.3 7.7 7.2  ALBUMIN 4.1 3.4* 3.5 3.4*   Recent Labs  Lab 08/24/18 1137  LIPASE 23   No results for input(s): AMMONIA in the last 168 hours.  ABG    Component Value Date/Time   PHART 7.408 08/29/2018 0557   PCO2ART 45.4 08/29/2018 0557   PO2ART 62.0 (L) 08/29/2018 0557   HCO3 28.7 (H) 08/29/2018 0557   TCO2 30 08/29/2018 0557   ACIDBASEDEF 3.0 (H) 08/28/2018 0559   O2SAT 92.0 08/29/2018 0557     Coagulation Profile: Recent Labs  Lab 08/27/18 2111  INR 1.28    Cardiac Enzymes: Recent Labs  Lab 08/24/18 1146 08/24/18 1644 08/24/18 2214 08/25/18 0407  CKTOTAL 574*  --   --   --   TROPONINI  --  1.24* 0.81* 0.59*    HbA1C: Hgb A1c MFr Bld  Date/Time Value Ref Range Status  08/28/2018 10:02 AM 5.9 (H) 4.8 - 5.6 % Final    Comment:    (NOTE) Pre diabetes:          5.7%-6.4% Diabetes:              >6.4% Glycemic control for   <7.0% adults with diabetes   05/13/2017 05:10 PM 6.4 (H) 4.8 - 5.6 % Final    Comment:    (NOTE) Pre diabetes:          5.7%-6.4% Diabetes:              >6.4% Glycemic control for   <7.0% adults with diabetes     CBG: Recent Labs  Lab 08/29/18 2030 08/30/18 0106 08/30/18 0428 08/30/18 0843 08/30/18 1252  GLUCAP 175* 216* 220* 190* 219*    Independent CC time 32 min  Baltazar Apo, MD, PhD 08/30/2018, 2:12 PM [facility] [phone] or if no answer [phone]

## 2018-08-30 NOTE — Progress Notes (Signed)
OT Cancellation Note  Patient Details Name: Clayton [name] MRN: [id number] DOB: [birth year]   Cancelled Treatment:    Reason Eval/Treat Not Completed: Patient not medically ready.  Will reattempt.  Lucille Passy, OTR/L [place] Pager [phone] Office [phone]   Lucille Passy M 08/30/2018, 8:48 AM

## 2018-08-31 ENCOUNTER — Inpatient Hospital Stay (HOSPITAL_COMMUNITY): Payer: Medicare Other

## 2018-08-31 ENCOUNTER — Encounter (HOSPITAL_COMMUNITY): Payer: Self-pay | Admitting: Radiology

## 2018-08-31 DIAGNOSIS — I639 Cerebral infarction, unspecified: Secondary | ICD-10-CM

## 2018-08-31 DIAGNOSIS — E1159 Type 2 diabetes mellitus with other circulatory complications: Secondary | ICD-10-CM

## 2018-08-31 LAB — CBC
HCT: 37.6 % — ABNORMAL LOW (ref 39.0–52.0)
Hemoglobin: 12 g/dL — ABNORMAL LOW (ref 13.0–17.0)
MCH: 28.8 pg (ref 26.0–34.0)
MCHC: 31.9 g/dL (ref 30.0–36.0)
MCV: 90.4 fL (ref 80.0–100.0)
Platelets: 160 10*3/uL (ref 150–400)
RBC: 4.16 MIL/uL — ABNORMAL LOW (ref 4.22–5.81)
RDW: 14.7 % (ref 11.5–15.5)
WBC: 9.2 10*3/uL (ref 4.0–10.5)
nRBC: 0 % (ref 0.0–0.2)

## 2018-08-31 LAB — BASIC METABOLIC PANEL
Anion gap: 9 (ref 5–15)
BUN: 33 mg/dL — ABNORMAL HIGH (ref 8–23)
CALCIUM: 9.1 mg/dL (ref 8.9–10.3)
CO2: 27 mmol/L (ref 22–32)
Chloride: 103 mmol/L (ref 98–111)
Creatinine, Ser: 0.72 mg/dL (ref 0.61–1.24)
GFR calc Af Amer: >60 mL/min (ref >60)
GFR calc non Af Amer: >60 mL/min (ref >60)
GLUCOSE: 176 mg/dL — AB (ref 70–99)
Potassium: 4.9 mmol/L (ref 3.5–5.1)
Sodium: 139 mmol/L (ref 135–145)

## 2018-08-31 LAB — TRIGLYCERIDES: Triglycerides: 32 mg/dL (ref <150)

## 2018-08-31 LAB — GLUCOSE, CAPILLARY
GLUCOSE-CAPILLARY: 148 mg/dL — AB (ref 70–99)
GLUCOSE-CAPILLARY: 146 mg/dL — AB (ref 70–99)
GLUCOSE-CAPILLARY: 109 mg/dL — AB (ref 70–99)
Glucose-Capillary: 127 mg/dL — ABNORMAL HIGH (ref 70–99)
Glucose-Capillary: 131 mg/dL — ABNORMAL HIGH (ref 70–99)
Glucose-Capillary: 155 mg/dL — ABNORMAL HIGH (ref 70–99)
Glucose-Capillary: 160 mg/dL — ABNORMAL HIGH (ref 70–99)
Glucose-Capillary: 170 mg/dL — ABNORMAL HIGH (ref 70–99)

## 2018-08-31 MED ORDER — METHYLPREDNISOLONE SODIUM SUCC 40 MG IJ SOLR
20.0000 mg | Freq: Two times a day (BID) | INTRAMUSCULAR | Status: DC
  Administered 2018-08-31 – 2018-09-03 (×6): 20 mg via INTRAVENOUS
  Filled 2018-08-31 (×6): qty 1

## 2018-08-31 NOTE — Clinical Social Work Note (Signed)
CSW acknowledges consult for SNF however pt on full vent support at this time with Fio02 of 70%. Fio02 must be 40% or lower before Vent SNF will review pt. CSW continuing to follow.   [place], [place]

## 2018-08-31 NOTE — Progress Notes (Signed)
NAME:  Clayton [name], MRN:  [id number], DOB:  10/24/1950, LOS: 7 ADMISSION DATE:  08/24/2018, CONSULTATION DATE:  08/28/2018 REFERRING MD:  Dr. Leonel Ramsay, CHIEF COMPLAINT:  Stroke  Brief History   59 yoM originally admitted to [facility] on 12/9 with AECOPD, AKI, dehydration s/p N/V for several days found with acute left sided deficits.  Code stroke initiated.  Found to have acute Right M2 occlusion.  TPA not given.  Transferred emergently to [facility] and taken to Neuro IR.  PCCM consulted for vent management.  History of present illness   HPI obtained from medical chart review as patient is currently intubated and sedated on mechanical ventilation.  67 year old male with history of tobacco abuse, COPD, chronic hypoxic respiratory failure on home O2 2Ls, HTN, HLD, DM, CAD w/prior MI, GERD, and prostate CA initially admitted to [facility] on 12/9 after being found on floor.  Patient complained of progressive SOB x 2 weeks, 1 week history of nausea, vomiting, and diarrhea with poor PO intake, dizziness, and generalized weakness.    Admitted for AECOPD, hypoxia, dehydration, AKI, elevated LFTs, and troponin.  Initial CXR and head CT negative.  Evaluated by Cardiology and empirically treated with heparin until 12/10 after TTE did not show any wall motion abnormality and elevation in troponin thought due to demand ischemia.  He has been treated with steroids, nebs, azithro and HFNC weaned down to 5L [place] as of yesterday. He has been afebrile and normotensive. Negative GI workup thus far (-stool, -RUQ [place], -hepatitis panel).  AKI resolved after IV hydration and transaminitis is improving.   LSW around shift change 1900 on 12/12.  After 2030, found to have left facial droop, aphasia, and left sided weakness.  Code stroke activated and assessed by tele Neuro.  NIHSS 16. CT head negative.  Decision made not to give TPA due to elevated PT/INR 15.9/ 1.28.  CTA head/ neck noted for acute right MCA occlusion.    Patient was emergently transferred to [facility] and taken to neuro IR for embolectomy.  Intubated for procedure and will return to ICU on ventilator.  Extubation deferred given ongoing AECOPD.  Therefore, PCCM consulted for further medical and ventilator management in ICU.  Past Medical History   tobacco abuse, COPD, chronic hypoxic respiratory failure on home O2 2Ls, HTN, HLD, DM, CAD w/prior MI, GERD, and prostate [place] Hospital Events   12/9 Admit at [facility] 12/13 tx to [facility]  Consults:   Procedures:  12/13 cerebral angiogram  12/13 ETT >>  Significant Diagnostic Tests:  12/9 [facility]  >> neg 12/10 TTE >> EF 50-55%, G1DD, mild AS, mod TR, PAP 57 mmHg 12/12 CTH >> neg; ASPECTS 10  12/12 CTA head/neck >> 1. Right M2 inferior division origin occlusion and nonocclusive thrombus of distal right M1. Right M2 thrombus approximately 13 mm in length. Good downstream collateralization. 2. Negative CT brain perfusion for acute stroke by perfusion criteria. Mild asymmetry of transit time, increased in the right posterior cerebral hemisphere, likely related to M2 thrombus. 3. Patent carotid and vertebral arteries of the neck. No dissection, aneurysm, or significant stenosis by NASCET criteria. 4. No additional intracranial large vessel occlusion, aneurysm, or vascular malformation. 5. Mild right vertebral artery origin stenosis with calcified plaque. 6. Mild right proximal ICA stenosis with calcified plaque. 7. Mild right and moderate left cavernous and paraclinoid ICA stenosis with calcified plaque.  12/13 CTH >> 1. Hypoattenuation within the right lentiform nucleus spanning up to approximately 3.4  cm (5 cc) likely representing an acute or subacute infarction. No associated hemorrhage or mass effect. Increased conspicuity from prior CT of head. 2. ASPECTS is 9  12/13 CT cerebral perfusion >>  1. Right lentiform nucleus infarction, ASPECTS 9 on noncontrast CT of head. Pseudo normalized  perfusion associated with this area visible infarction. 2. No additional region of acute infarction by perfusion criteria identified. 3. Mildly increased transit time in the right posterior cerebral hemisphere likely related collateralization of right M2 inferior division occlusion.  Micro Data:  12/10 MRSA PCR  >> neg 12/10 Cdiff  >> neg 12/10 GI panel >> neg  Antimicrobials:  12/9 azithro >> 12/13 ancef pre op  Interim history/subjective:  Remains on high PEEP 12 and FiO2 70% No events overnight  Objective   Blood pressure 130/71, pulse 68, temperature 99.1 F (37.3 C), resp. rate 16, height 6' (1.829 m), weight 96.6 kg, SpO2 98 %.    Vent Mode: PRVC FiO2 (%):  [70 %] 70 % Set Rate:  [16 bmp] 16 bmp Vt Set:  [620 mL] 620 mL PEEP:  [12 cmH20] 12 cmH20 Plateau Pressure:  [20 cmH20-26 cmH20] 22 cmH20   Intake/Output Summary (Last 24 hours) at 08/31/2018 1400 Last data filed at 08/31/2018 1100 Gross per 24 hour  Intake 1579.68 ml  Output 2195 ml  Net -615.32 ml   Filed Weights   08/27/18 2200 08/29/18 0500 08/30/18 0458  Weight: 95.5 kg 96 kg 96.6 kg   Examination: General: Chronically ill appearing male, NAD HENT: [place]/AT, PERRL, EOM-I and MMM, ETT in place Lungs: Decreased BS diffusely Cardiovascular: RRR, Nl S1/S2 and -M/R/G Abdomen: soft, NT, ND and +BS Extremities: -edema and -tenderness Neuro: Arousable and follows command Skin: No rash  I reviewed CT of the chest myself, no PE noted  Resolved Hospital Problem list    Assessment & Plan:  Right MCA stroke s/p EVR and complete revascularization, was extubated in the PACU post op but mental status was poor, unable to protect his airway and was reintubated.  Acute right MCA embolic stroke,  ASA per neuro Neurology following Minimize sedation as able  Acute respiratory failure with hypoxemia: CT with no PE Will order an echo with a bubble study\ Full vent support for now CXR and ABG in AM  COPD  exacerbation: Decrease solumedrol to 20 q12 x4 doses then D/C, no wheezing noted Titrate O2 for sat of 88-92%  Hyperglycemia Sliding scale insulin Lantus 25  History CAD and PTCI Aspirin as above  HTN:  Continue carvedilol Cleviprex weaned to off   Hyperkalemia: Follow serial BMP  Nutrition Tube feedings initiated   Labs   CBC: Recent Labs  Lab 08/27/18 0403 08/28/18 0559 08/28/18 1002 08/29/18 0402 08/30/18 0519 08/31/18 0331  WBC 8.2  --  5.9 6.4 8.3 9.2  NEUTROABS  --   --  5.0  --   --   --   HGB 12.2* 13.6 11.3* 11.7* 11.9* 12.0*  HCT 40.8 40.0 38.9* 39.0 38.4* 37.6*  MCV 98.3  --  95.3 94.2 92.8 90.4  PLT 193  --  190 180 177 756    Basic Metabolic Panel: Recent Labs  Lab 08/27/18 1136 08/28/18 0559 08/28/18 1002 08/29/18 0402 08/29/18 1638 08/30/18 0519 08/30/18 2055 08/31/18 0331  NA 133* 138 137 137  --  137  --  139  K 5.7* 5.6* 5.5* 5.1  --  5.1  --  4.9  CL 103  --  104 103  --  103  --  103  CO2 24  --  25 27  --  25  --  27  GLUCOSE 217*  --  138* 236*  --  241*  --  176*  BUN 38*  --  28* 30*  --  33*  --  33*  CREATININE 0.89  --  0.70 0.72  --  0.76  --  0.72  CALCIUM 8.9  --  9.0 9.0  --  8.9  --  9.1  MG  --   --  1.5* 1.7 1.5* 1.6* 1.6*  --   PHOS  --   --  3.1 3.5 2.9 3.0 3.0  --    GFR: Estimated Creatinine Clearance: 109.5 mL/min (by C-G formula based on SCr of 0.72 mg/dL). Recent Labs  Lab 08/28/18 1002 08/29/18 0402 08/30/18 0519 08/31/18 0331  WBC 5.9 6.4 8.3 9.2    Liver Function Tests: Recent Labs  Lab 08/25/18 0407 08/26/18 0409 08/27/18 0403  AST 281* 208* 190*  ALT 308* 351* 375*  ALKPHOS 116 116 105  BILITOT 2.3* 2.1* 1.8*  PROT 7.3 7.7 7.2  ALBUMIN 3.4* 3.5 3.4*   No results for input(s): LIPASE, AMYLASE in the last 168 hours. No results for input(s): AMMONIA in the last 168 hours.  ABG    Component Value Date/Time   PHART 7.408 08/29/2018 0557   PCO2ART 45.4 08/29/2018 0557   PO2ART 62.0  (L) 08/29/2018 0557   HCO3 28.7 (H) 08/29/2018 0557   TCO2 30 08/29/2018 0557   ACIDBASEDEF 3.0 (H) 08/28/2018 0559   O2SAT 92.0 08/29/2018 0557     Coagulation Profile: Recent Labs  Lab 08/27/18 2111  INR 1.28    Cardiac Enzymes: Recent Labs  Lab 08/24/18 1644 08/24/18 2214 08/25/18 0407  TROPONINI 1.24* 0.81* 0.59*    HbA1C: Hgb A1c MFr Bld  Date/Time Value Ref Range Status  08/28/2018 10:02 AM 5.9 (H) 4.8 - 5.6 % Final    Comment:    (NOTE) Pre diabetes:          5.7%-6.4% Diabetes:              >6.4% Glycemic control for   <7.0% adults with diabetes   05/13/2017 05:10 PM 6.4 (H) 4.8 - 5.6 % Final    Comment:    (NOTE) Pre diabetes:          5.7%-6.4% Diabetes:              >6.4% Glycemic control for   <7.0% adults with diabetes     CBG: Recent Labs  Lab 08/30/18 2004 08/30/18 2337 08/31/18 0350 08/31/18 0743 08/31/18 1135  GLUCAP 131* 160* 170* 155* 146*   The patient is critically ill with multiple organ systems failure and requires high complexity decision making for assessment and support, frequent evaluation and titration of therapies, application of advanced monitoring technologies and extensive interpretation of multiple databases.   Critical Care Time devoted to patient care services described in this note is  32  Minutes. This time reflects time of care of this signee Dr [name]. This critical care time does not reflect procedure time, or teaching time or supervisory time of PA/NP/Med student/Med Resident etc but could involve care discussion time.  Rush Farmer, M.D. [employer] [facility] Pulmonary/Critical Care Medicine. Pager: [phone]. After hours pager: [phone].

## 2018-08-31 NOTE — Progress Notes (Signed)
SLP Cancellation Note  Patient Details Name: Clayton [name] MRN: [id number] DOB: 12-Feb-1951   Cancelled treatment:       Reason Eval/Treat Not Completed: Patient not medically ready. Per chart, remains intubated. Will f/u as able.   Germain Osgood 08/31/2018, 8:17 AM  Germain Osgood, M.A. [place] Acute Environmental education officer [phone] Office [phone]

## 2018-08-31 NOTE — Progress Notes (Signed)
OT Cancellation Note  Patient Details Name: Lum Stillinger MRN: [id number] DOB: [birth year]   Cancelled Treatment:    Reason Eval/Treat Not Completed: Patient not medically ready. Pt continues to require full vent support with peep of 12 and Fio02 of 70%. Will continue to follow.  Malka So 08/31/2018, 2:18 PM  Nestor Lewandowsky, OTR/L Acute Rehabilitation Services Pager: [phone] Office: [phone]

## 2018-08-31 NOTE — Progress Notes (Signed)
*  Preliminary Results* Bilateral lower extremity venous duplex completed. Bilateral lower extremities are negative for deep vein thrombosis. There is no evidence of Baker's cyst bilaterally.  08/31/2018 4:29 PM Philis Doke Dawna Part

## 2018-08-31 NOTE — Progress Notes (Signed)
PT Cancellation Note  Patient Details Name: Clayton [name] MRN: [id number] DOB: [birth year]   Cancelled Treatment:    Reason Eval/Treat Not Completed: Patient not medically ready.  Pt remains on high FiO2 (70%) with high PEEP (12) on full ventilatory support.  RN recommended holding until his vent settings are better.  He is awake and alert waving to me in the hallway.  PT will continue to check daily for appropriateness.    Thanks,  Clayton [name]. Clayton [name], PT, DPT  Acute Rehabilitation [phone] pager [phone]) [phone] office     Clayton [name] 08/31/2018, 12:36 PM

## 2018-08-31 NOTE — Progress Notes (Signed)
STROKE TEAM PROGRESS NOTE   SUBJECTIVE (INTERVAL HISTORY) No family is at bedside. The patient remains intubated and still at PEEP 12. Not able to be extubated. Pt awake, alert, following commands, on very low dose of propofol. Still has right hemiparesis, unchanged.    OBJECTIVE Vitals:   08/31/18 1000 08/31/18 1100 08/31/18 1140 08/31/18 1426  BP: 114/70 120/69 130/71   Pulse: 68 70 68   Resp: 17 20 16    Temp: 99.1 F (37.3 C) 99.1 F (37.3 C)    TempSrc:      SpO2: 96% 96% 98% 93%  Weight:      Height:        CBC:  Recent Labs  Lab 08/28/18 1002  08/30/18 0519 08/31/18 0331  WBC 5.9   < > 8.3 9.2  NEUTROABS 5.0  --   --   --   HGB 11.3*   < > 11.9* 12.0*  HCT 38.9*   < > 38.4* 37.6*  MCV 95.3   < > 92.8 90.4  PLT 190   < > 177 160   < > = values in this interval not displayed.    Basic Metabolic Panel:  Recent Labs  Lab 08/30/18 0519 08/30/18 2055 08/31/18 0331  NA 137  --  139  K 5.1  --  4.9  CL 103  --  103  CO2 25  --  27  GLUCOSE 241*  --  176*  BUN 33*  --  33*  CREATININE 0.76  --  0.72  CALCIUM 8.9  --  9.1  MG 1.6* 1.6*  --   PHOS 3.0 3.0  --     Lipid Panel:     Component Value Date/Time   CHOL 112 08/28/2018 1002   TRIG 32 08/31/2018 0630   HDL 32 (L) 08/28/2018 1002   CHOLHDL 3.5 08/28/2018 1002   VLDL 15 08/28/2018 1002   LDLCALC 65 08/28/2018 1002   HgbA1c:  Lab Results  Component Value Date   HGBA1C 5.9 (H) 08/28/2018   Urine Drug Screen:     Component Value Date/Time   LABOPIA NONE DETECTED 08/28/2018 1816   COCAINSCRNUR NONE DETECTED 08/28/2018 1816   LABBENZ NONE DETECTED 08/28/2018 1816   AMPHETMU NONE DETECTED 08/28/2018 1816   THCU NONE DETECTED 08/28/2018 1816   LABBARB NONE DETECTED 08/28/2018 1816    Alcohol Level No results found for: ETH  IMAGING  Ct Angio Head W Or Wo Contrast Ct Angio Neck W Or Wo Contrast Ct Cerebral Perfusion W Contrast 08/28/2018 IMPRESSION:  1. Right M2 inferior division origin  occlusion and nonocclusive thrombus of distal right M1. Right M2 thrombus approximately 13 mm in length. Good downstream collateralization.  2. Negative CT brain perfusion for acute stroke by perfusion criteria. Mild asymmetry of transit time, increased in the right posterior cerebral hemisphere, likely related to M2 thrombus.  3. Patent carotid and vertebral arteries of the neck. No dissection, aneurysm, or significant stenosis by NASCET criteria.  4. No additional intracranial large vessel occlusion, aneurysm, or vascular malformation.  5. Mild right vertebral artery origin stenosis with calcified plaque.  6. Mild right proximal ICA stenosis with calcified plaque.  7. Mild right and moderate left cavernous and paraclinoid ICA stenosis with calcified plaque.    Ct Cerebral Perfusion W Contrast 08/28/2018 IMPRESSION:  1. Right lentiform nucleus infarction, ASPECTS 9 on noncontrast CT of head. Pseudo normalized perfusion associated with this area visible infarction.  2. No additional region of acute infarction  by perfusion criteria identified.  3. Mildly increased transit time in the right posterior cerebral hemisphere likely related collateralization of right M2 inferior division occlusion.  Ct Head Code Stroke Wo Contrast 08/28/2018 IMPRESSION:  1. Hypoattenuation within the right lentiform nucleus spanning up to approximately 3.4 cm (5 cc) likely representing an acute or subacute infarction. No associated hemorrhage or mass effect. Increased conspicuity from prior CT of head.  2. ASPECTS is 9    Ct Head Code Stroke Wo Contrast` 08/27/2018 IMPRESSION:  1. No acute hemorrhage.  2. ASPECTS is 10.    Cerebral Angiogram S/P RT common carotid arteriogram followed by complete revascularization of occluded distal RT MCA M1 and co dominant inf division withx 1 pass with 29mm x 66mm embotrap retriever device achieving a TICI 3 revascularization  Transthoracic Echocardiogram   08/25/2018 Study Conclusions - Left ventricle: The cavity size was normal. There was mild   concentric hypertrophy. Systolic function was normal. The   estimated ejection fraction was in the range of 50% to 55%. There   was septal-lateral dyssynchrony. Doppler parameters are   consistent with abnormal left ventricular relaxation (grade 1   diastolic dysfunction). - Ventricular septum: Septal motion showed abnormal function and   dyssynergy. These changes are consistent with intraventricular   conduction delay. - Aortic valve: Trileaflet; mildly thickened, moderately calcified   leaflets. Morphologically, there appears to be mild calcific   aortic stenosis. - Mitral valve: Mildly calcified annulus. - Right ventricle: The cavity size was mildly dilated. Systolic   function was moderately reduced. - Right atrium: The atrium was mildly dilated. - Tricuspid valve: There was moderate regurgitation. - Pulmonary arteries: PA peak pressure: 57 mm Hg (S). - Inferior vena cava: The vessel was dilated. The respirophasic   diameter changes were blunted (< 50%), consistent with elevated   central venous pressure. Estimated CVP 15 mmHg.   Ct Head Wo Contrast 08/29/2018 IMPRESSION:  1. Evolving acute RIGHT basal ganglia nonhemorrhagic infarct with petechial hemorrhage. Additional small acute infarcts better demonstrated on prior MRI.  2. Acute pontine lacunar infarct versus artifact.   3. Moderate to severe chronic small vessel ischemic changes, advanced for age.  4. Moderate parenchymal brain volume loss, advanced for age.     Mr Brain Wo Contrast 08/28/2018 IMPRESSION:  Areas of acute infarction within the right middle cerebral artery territory as outlined above. Largest region affects the corpus striatum of the basal ganglia on the right. Petechial blood products present in that location. Scattered other few small cortical infarctions within the temporal and parietal lobes. Minimal  petechial blood products associated with the small posterior parietal cortical infarctions.   LE venous doppler pending    PHYSICAL EXAM Blood pressure 130/71, pulse 68, temperature 99.1 F (37.3 C), resp. rate 16, height 6' (1.829 m), weight 96.6 kg, SpO2 93 %. Elderly middle-aged African-American male who is still intubated and sedated. Afebrile. Head is nontraumatic. Neck is supple without bruit.    Cardiac exam no murmur or gallop. Lungs are clear to auscultation. Distal pulses are well felt.  Neurological Exam Patient is still intubated and on low dose propofol. He is awake and alert and following all simple commands. Eyes open.  Pupil bilateral equal size, EOMI, blinking to visual threat bilaterally.  Positive gag and cough, positive corneal.  Fundi were not visualized.  Facial symmetry not able to test due to ET tube. Tongue midline. Moves right upper extremity spontaneously and against gravity, 3/5 LUE proximal but 0/5 distal. BLE proximal  0/5 proximal but knee flexion 3/5 on the right and 2/5 on the left, bilateral foot DF 3/5 on the right , minimal movement on the left. DTR diminished bilaterally. Babinski negative bilateral. Sensation, coordination and gait not tested.   ASSESSMENT/PLAN Clayton [name] is a 67 y.o. male with history of coronary artery disease previous MI / stenting, COPD, diabetes mellitus, gastroesophageal reflux disease, hypertension, hyperlipidemia, prostate cancer, and peripheral vascular disease presenting with left-sided weakness. He did not receive IV t-PA due to elevated protime.  Stroke: Rt MCA, MCA/ACA, MCA/PCA infarcts - embolic - source unknown, large vessel atherosclerosis versus cardioembolic source versus paradoxical emboli  Resultant  Left hemiparesis  CT head - Hypoattenuation within the right lentiform nucleus likely representing an acute or subacute infarction.  CTA H&N - Right M2 inferior division origin occlusion and nonocclusive thrombus of  distal right M1. Right M2 thrombus approximately 13 mm in length.   DSA right occluded M1 and M2 status post IR with TICI3 reperfusion  MRI head - right MCA, MCA/ACA, MCA/PCA infarcts with right BG petechial hemorrhagic transformation  CT repeat right MCA infarct with petechial hemorrhage at right BG  LE venous Doppler pending  2D Echo  - EF 50 - 55%. No cardiac source of emboli identified.   Consider TEE/loop recorder to evaluate cardioembolic source once stable  LDL - 65  HgbA1c - 5.9  UDS - negative  VTE prophylaxis - [place] Heparin  Diet - NPO  No antithrombotic prior to admission, now on aspirin 81 mg daily.    Patient will be counseled to be compliant with his antithrombotic medications  Ongoing aggressive stroke risk factor management  Therapy recommendations:  pending  Disposition:  Pending  Respiratory failure  Intubated on low dose propofol  CCM on board  Still has high PEEP, not able to be extubated   CTA chest no PE  Still on steroids  Hypertension  Stable . BP goal < 160 . Labetalol PRN . Long-term BP goal normotensive  Diabetes  HgbA1c 5.9, goal < 7.0  Controlled  Hyperglycemia improved  On Lantus 25U  SSI  CBG monitoring  Tobacco abuse  Current smoker  Smoking cessation counseling provided  Pt is willing to quit  Other Stroke Risk Factors  Advanced age  Coronary artery disease/MI status post stent 2009  PVD  Other Active Problems  Hyperkalemia - 5.5 -> Kayexalate per CCM-> 5.1->5.1->4.9  Prostate cancer  Acute gastroenteritis, resolved  AKI, resolved  Hospital day # 7  This patient is critically ill and at significant risk of neurological worsening, death and care requires constant monitoring of vital signs, hemodynamics,respiratory and cardiac monitoring, extensive review of multiple databases, frequent neurological assessment, discussion with family, other specialists and medical decision making of high  complexity. I spent 35 minutes of neurocritical care time  in the care of  this patient. Discussed with CCM NP.   Rosalin Hawking, MD PhD Stroke Neurology 08/31/2018 2:33 PM    To contact Stroke Continuity provider, please refer to [email or web address] After hours, contact General Neurology

## 2018-09-01 ENCOUNTER — Inpatient Hospital Stay (HOSPITAL_COMMUNITY): Payer: Medicare Other

## 2018-09-01 DIAGNOSIS — R55 Syncope and collapse: Secondary | ICD-10-CM

## 2018-09-01 DIAGNOSIS — I6601 Occlusion and stenosis of right middle cerebral artery: Secondary | ICD-10-CM

## 2018-09-01 LAB — BASIC METABOLIC PANEL
Anion gap: 10 (ref 5–15)
BUN: 34 mg/dL — ABNORMAL HIGH (ref 8–23)
CHLORIDE: 100 mmol/L (ref 98–111)
CO2: 28 mmol/L (ref 22–32)
Calcium: 9 mg/dL (ref 8.9–10.3)
Creatinine, Ser: 0.69 mg/dL (ref 0.61–1.24)
GFR calc Af Amer: >60 mL/min (ref >60)
GFR calc non Af Amer: >60 mL/min (ref >60)
Glucose, Bld: 174 mg/dL — ABNORMAL HIGH (ref 70–99)
Potassium: 4.6 mmol/L (ref 3.5–5.1)
Sodium: 138 mmol/L (ref 135–145)

## 2018-09-01 LAB — CBC
HCT: 36.7 % — ABNORMAL LOW (ref 39.0–52.0)
HEMOGLOBIN: 11.8 g/dL — AB (ref 13.0–17.0)
MCH: 29.2 pg (ref 26.0–34.0)
MCHC: 32.2 g/dL (ref 30.0–36.0)
MCV: 90.8 fL (ref 80.0–100.0)
Platelets: 159 10*3/uL (ref 150–400)
RBC: 4.04 MIL/uL — ABNORMAL LOW (ref 4.22–5.81)
RDW: 14.9 % (ref 11.5–15.5)
WBC: 9.3 10*3/uL (ref 4.0–10.5)
nRBC: 0 % (ref 0.0–0.2)

## 2018-09-01 LAB — POCT I-STAT 3, ART BLOOD GAS (G3+)
Acid-Base Excess: 5 mmol/L — ABNORMAL HIGH (ref 0.0–2.0)
Bicarbonate: 29.5 mmol/L — ABNORMAL HIGH (ref 20.0–28.0)
O2 Saturation: 96 %
Patient temperature: 98.8
TCO2: 31 mmol/L (ref 22–32)
pCO2 arterial: 42.6 mmHg (ref 32.0–48.0)
pH, Arterial: 7.449 (ref 7.350–7.450)
pO2, Arterial: 81 mmHg — ABNORMAL LOW (ref 83.0–108.0)

## 2018-09-01 LAB — GLUCOSE, CAPILLARY
Glucose-Capillary: 160 mg/dL — ABNORMAL HIGH (ref 70–99)
Glucose-Capillary: 161 mg/dL — ABNORMAL HIGH (ref 70–99)
Glucose-Capillary: 163 mg/dL — ABNORMAL HIGH (ref 70–99)
Glucose-Capillary: 165 mg/dL — ABNORMAL HIGH (ref 70–99)
Glucose-Capillary: 178 mg/dL — ABNORMAL HIGH (ref 70–99)
Glucose-Capillary: 181 mg/dL — ABNORMAL HIGH (ref 70–99)

## 2018-09-01 LAB — ECHOCARDIOGRAM LIMITED BUBBLE STUDY
Height: 72 in
Weight: 3333.36 oz

## 2018-09-01 LAB — MAGNESIUM: Magnesium: 1.7 mg/dL (ref 1.7–2.4)

## 2018-09-01 LAB — PHOSPHORUS: PHOSPHORUS: 3.2 mg/dL (ref 2.5–4.6)

## 2018-09-01 MED ORDER — BISACODYL 10 MG RE SUPP
10.0000 mg | Freq: Every day | RECTAL | Status: DC | PRN
  Administered 2018-09-04: 10 mg via RECTAL
  Filled 2018-09-01 (×2): qty 1

## 2018-09-01 MED ORDER — MAGNESIUM SULFATE 2 GM/50ML IV SOLN
2.0000 g | Freq: Once | INTRAVENOUS | Status: AC
  Administered 2018-09-01: 2 g via INTRAVENOUS
  Filled 2018-09-01: qty 50

## 2018-09-01 MED ORDER — POLYETHYLENE GLYCOL 3350 17 G PO PACK
17.0000 g | PACK | Freq: Every day | ORAL | Status: DC
  Administered 2018-09-01 – 2018-09-05 (×4): 17 g via ORAL
  Filled 2018-09-01 (×4): qty 1

## 2018-09-01 MED ORDER — MIDAZOLAM HCL 2 MG/2ML IJ SOLN
1.0000 mg | INTRAMUSCULAR | Status: DC | PRN
  Administered 2018-09-02 (×2): 1 mg via INTRAVENOUS
  Filled 2018-09-01 (×2): qty 2

## 2018-09-01 MED ORDER — GUAIFENESIN 100 MG/5ML PO SOLN
15.0000 mL | Freq: Four times a day (QID) | ORAL | Status: DC
  Administered 2018-09-01 – 2018-09-05 (×7): 300 mg via ORAL
  Filled 2018-09-01 (×8): qty 15

## 2018-09-01 NOTE — Progress Notes (Addendum)
STROKE TEAM PROGRESS NOTE   SUBJECTIVE (INTERVAL HISTORY) Patient RN is at bedside. The patient remains intubated, however, PEEP is right now at 5, and patient was on weaning trials earlier today and became fatigued and tachycardia in the afternoon, he has to be sedated and put back on vent.  Due to rounding patient still sedated and not able to cooperate with exam, however as per RN, earlier today patient was able to follow simple commands and mouth words.   OBJECTIVE Vitals:   09/01/18 1452 09/01/18 1500 09/01/18 1556 09/01/18 1649  BP:  109/67 137/75 126/70  Pulse:  (!) 59 70 92  Resp:  16 20   Temp: 97.7 F (36.5 C)     TempSrc: Axillary     SpO2:  92% 96%   Weight:      Height:        CBC:  Recent Labs  Lab 08/28/18 1002  08/31/18 0331 09/01/18 0322  WBC 5.9   < > 9.2 9.3  NEUTROABS 5.0  --   --   --   HGB 11.3*   < > 12.0* 11.8*  HCT 38.9*   < > 37.6* 36.7*  MCV 95.3   < > 90.4 90.8  PLT 190   < > 160 159   < > = values in this interval not displayed.    Basic Metabolic Panel:  Recent Labs  Lab 08/30/18 2055 08/31/18 0331 09/01/18 0322  NA  --  139 138  K  --  4.9 4.6  CL  --  103 100  CO2  --  27 28  GLUCOSE  --  176* 174*  BUN  --  33* 34*  CREATININE  --  0.72 0.69  CALCIUM  --  9.1 9.0  MG 1.6*  --  1.7  PHOS 3.0  --  3.2    Lipid Panel:     Component Value Date/Time   CHOL 112 08/28/2018 1002   TRIG 32 08/31/2018 0630   HDL 32 (L) 08/28/2018 1002   CHOLHDL 3.5 08/28/2018 1002   VLDL 15 08/28/2018 1002   LDLCALC 65 08/28/2018 1002   HgbA1c:  Lab Results  Component Value Date   HGBA1C 5.9 (H) 08/28/2018   Urine Drug Screen:     Component Value Date/Time   LABOPIA NONE DETECTED 08/28/2018 1816   COCAINSCRNUR NONE DETECTED 08/28/2018 1816   LABBENZ NONE DETECTED 08/28/2018 1816   AMPHETMU NONE DETECTED 08/28/2018 1816   THCU NONE DETECTED 08/28/2018 1816   LABBARB NONE DETECTED 08/28/2018 1816    Alcohol Level No results found for:  ETH  IMAGING  Ct Angio Head W Or Wo Contrast Ct Angio Neck W Or Wo Contrast Ct Cerebral Perfusion W Contrast 08/28/2018 IMPRESSION:  1. Right M2 inferior division origin occlusion and nonocclusive thrombus of distal right M1. Right M2 thrombus approximately 13 mm in length. Good downstream collateralization.  2. Negative CT brain perfusion for acute stroke by perfusion criteria. Mild asymmetry of transit time, increased in the right posterior cerebral hemisphere, likely related to M2 thrombus.  3. Patent carotid and vertebral arteries of the neck. No dissection, aneurysm, or significant stenosis by NASCET criteria.  4. No additional intracranial large vessel occlusion, aneurysm, or vascular malformation.  5. Mild right vertebral artery origin stenosis with calcified plaque.  6. Mild right proximal ICA stenosis with calcified plaque.  7. Mild right and moderate left cavernous and paraclinoid ICA stenosis with calcified plaque.    Ct Cerebral Perfusion  W Contrast 08/28/2018 IMPRESSION:  1. Right lentiform nucleus infarction, ASPECTS 9 on noncontrast CT of head. Pseudo normalized perfusion associated with this area visible infarction.  2. No additional region of acute infarction by perfusion criteria identified.  3. Mildly increased transit time in the right posterior cerebral hemisphere likely related collateralization of right M2 inferior division occlusion.  Ct Head Code Stroke Wo Contrast 08/28/2018 IMPRESSION:  1. Hypoattenuation within the right lentiform nucleus spanning up to approximately 3.4 cm (5 cc) likely representing an acute or subacute infarction. No associated hemorrhage or mass effect. Increased conspicuity from prior CT of head.  2. ASPECTS is 9    Ct Head Code Stroke Wo Contrast` 08/27/2018 IMPRESSION:  1. No acute hemorrhage.  2. ASPECTS is 10.    Cerebral Angiogram S/P RT common carotid arteriogram followed by complete revascularization of occluded distal RT  MCA M1 and co dominant inf division withx 1 pass with 73mm x 47mm embotrap retriever device achieving a TICI 3 revascularization  Transthoracic Echocardiogram  08/25/2018 Study Conclusions - Left ventricle: The cavity size was normal. There was mild   concentric hypertrophy. Systolic function was normal. The   estimated ejection fraction was in the range of 50% to 55%. There   was septal-lateral dyssynchrony. Doppler parameters are   consistent with abnormal left ventricular relaxation (grade 1   diastolic dysfunction). - Ventricular septum: Septal motion showed abnormal function and   dyssynergy. These changes are consistent with intraventricular   conduction delay. - Aortic valve: Trileaflet; mildly thickened, moderately calcified   leaflets. Morphologically, there appears to be mild calcific   aortic stenosis. - Mitral valve: Mildly calcified annulus. - Right ventricle: The cavity size was mildly dilated. Systolic   function was moderately reduced. - Right atrium: The atrium was mildly dilated. - Tricuspid valve: There was moderate regurgitation. - Pulmonary arteries: PA peak pressure: 57 mm Hg (S). - Inferior vena cava: The vessel was dilated. The respirophasic   diameter changes were blunted (< 50%), consistent with elevated   central venous pressure. Estimated CVP 15 mmHg.   Ct Head Wo Contrast 08/29/2018 IMPRESSION:  1. Evolving acute RIGHT basal ganglia nonhemorrhagic infarct with petechial hemorrhage. Additional small acute infarcts better demonstrated on prior MRI.  2. Acute pontine lacunar infarct versus artifact.   3. Moderate to severe chronic small vessel ischemic changes, advanced for age.  4. Moderate parenchymal brain volume loss, advanced for age.    Mr Brain Wo Contrast 08/28/2018 IMPRESSION:  Areas of acute infarction within the right middle cerebral artery territory as outlined above. Largest region affects the corpus striatum of the basal ganglia on the  right. Petechial blood products present in that location. Scattered other few small cortical infarctions within the temporal and parietal lobes. Minimal petechial blood products associated with the small posterior parietal cortical infarctions.    LE venous doppler no DVT    PHYSICAL EXAM Blood pressure 126/70, pulse 92, temperature 97.7 F (36.5 C), temperature source Axillary, resp. rate 20, height 6' (1.829 m), weight 94.5 kg, SpO2 96 %.   Elderly middle-aged African-American male who is still intubated and sedated. Afebrile. Head is nontraumatic. Neck is supple without bruit.    Cardiac exam no murmur or gallop. Lungs are clear to auscultation. Distal pulses are well felt.  Neurological Exam Patient is still intubated and sedated. He was awake and alert and following all simple commands as per RN before sedation.  However currently he is sedated, eyes closed, not open eyes on  voice.  Pupil bilateral equal size, eyes middle position.  Positive gag and cough, positive corneal.  Fundi were not visualized.  Facial symmetry not able to test due to ET tube. Tongue midline in mouth. Moves right upper extremity on pain stimulation, mild withdrawal with pain at left upper extremity and bilateral lower extremities. DTR diminished bilaterally. Babinski negative bilateral. Sensation, coordination and gait not tested.   ASSESSMENT/PLAN [name] is a 67 y.o. male with history of coronary artery disease previous MI / stenting, COPD, diabetes mellitus, gastroesophageal reflux disease, hypertension, hyperlipidemia, prostate cancer, and peripheral vascular disease presenting with left-sided weakness. He did not receive IV t-PA due to elevated protime.  Stroke: Rt MCA, MCA/ACA, MCA/PCA infarcts - embolic - source unknown, large vessel atherosclerosis versus cardioembolic source  Resultant  Left hemiparesis  CT head - Hypoattenuation within the right lentiform nucleus likely representing an acute or  subacute infarction.  CTA H&N - Right M2 inferior division origin occlusion and nonocclusive thrombus of distal right M1. Right M2 thrombus approximately 13 mm in length.   DSA right occluded M1 and M2 status post IR with TICI3 reperfusion  MRI head - right MCA, MCA/ACA, MCA/PCA infarcts with right BG petechial hemorrhagic transformation  CT repeat right MCA infarct with petechial hemorrhage at right BG  LE venous Doppler no DVT  2D Echo  - EF 50 - 55%. No cardiac source of emboli identified.   Consider TEE/loop recorder to evaluate cardioembolic source once stable  LDL - 65  HgbA1c - 5.9  UDS - negative  VTE prophylaxis - [place] Heparin  Diet - NPO  No antithrombotic prior to admission, now on aspirin 81 mg daily.    Patient will be counseled to be compliant with his antithrombotic medications  Ongoing aggressive stroke risk factor management  Therapy recommendations: CIR  Disposition:  Pending  Respiratory failure  Intubated on sedation  CCM on board  PEEP down from 12 to 5 today  Able to tolerate whole morning weaning trial  Not able to be extubated today, plan for extubation tomorrow  CTA chest no PE  Still on steroids  Hypertension  Stable . BP goal < 160 . Labetalol PRN . Long-term BP goal normotensive  Diabetes  HgbA1c 5.9, goal < 7.0  Controlled  Hyperglycemia much improved  On Lantus 25U  SSI  CBG monitoring  Tobacco abuse  Current smoker  Smoking cessation counseling provided  On nicotine patch  Pt is willing to quit  Other Stroke Risk Factors  Advanced age  Coronary artery disease/MI status post stent 2009  PVD  Other Active Problems  Hyperkalemia - 5.5 -> Kayexalate per CCM-> 5.1->5.1->4.9-  Prostate cancer  Acute gastroenteritis, resolved  AKI, resolved  Hospital day # 8  This patient is critically ill and at significant risk of neurological worsening, death and care requires constant monitoring of vital  signs, hemodynamics,respiratory and cardiac monitoring, extensive review of multiple databases, frequent neurological assessment, discussion with family, other specialists and medical decision making of high complexity. I spent 30 minutes of neurocritical care time  in the care of  this patient.   Rosalin Hawking, MD PhD Stroke Neurology 09/01/2018 4:50 PM    To contact Stroke Continuity provider, please refer to [email or web address] After hours, contact General Neurology

## 2018-09-01 NOTE — Progress Notes (Signed)
SLP Cancellation Note  Patient Details Name: Clayton [name] MRN: [id number] DOB: Jun 09, 1951   Cancelled treatment:       Reason Eval/Treat Not Completed: Patient not medically ready (remains on vent)   Germain Osgood 09/01/2018, 8:38 AM  Germain Osgood, M.A. [place] [facility] Environmental education officer [phone] Office [phone]

## 2018-09-01 NOTE — Evaluation (Addendum)
Physical Therapy Evaluation Patient Details Name: Clayton [name] MRN: [id number] DOB: May 06, 1951 Today's Date: 09/01/2018   History of Present Illness  67 y.o. male admitted to [facility] on 08/24/18 for SOB.  Pt dx with acute on chronic respiratory failure, acute kidney injury, acute gastroenteritis, elevated troponin (thought to be demand ischemia), lactic acidosis, HTN, elevated liver enzymes.  On 08/27/18 pt had sudden onset L facial droop and arm weakness.  CTA revealed R M2 occlusion. Pt transferred emergently to [facility] and IR preformed R common carotid arteriorgram followed by complete revascularization of R MCA M1 on 08/28/18.  Pt failed extubation post operatively and has been ventilated via ETT since revascularization.  Pt with other significant PMH of PVD, prostate CA, HTN, DM, COPD, CAD, cardiac stent placement, and bil cataract surgery.  Clinical Impression  Pt was able to sit EOB briefly, but unable to tolerate ETT, coughing and gaging EOB.  RN in room suctioning orally and deep, but pt remained in distress.  Positioned back in supine and pt calmed down.  At bed level, he was able to follow simple commands and tell [place] his name and birthday.  He is weaker on the left.  He reports he has two daughters.  I would like to see if he would be appropriate for CIR level therapies at discharge as he is young and was so independent PTA.   PT to follow acutely for deficits listed below.    Follow Up Recommendations CIR    Equipment Recommendations  Rolling walker with 5" wheels;3in1 (PT);Wheelchair (measurements PT);Wheelchair cushion (measurements PT)    Recommendations for Other Services Rehab consult     Precautions / Restrictions Precautions Precautions: Fall Precaution Comments: left sided weakness      Mobility  Bed Mobility Overal bed mobility: Needs Assistance       Supine to sit: Mod assist;HOB elevated Sit to supine: Mod assist;HOB elevated   General bed mobility comments: Mod  assist to support trunk to get to EOB and assist in progressing bil legs to EOB.  Assist needed to support trunk and lift both legs to return to supine.   Transfers                 General transfer comment: Not attempted as he did not tolerate sitting EOB well with ETT, coughing, and gag         Balance Overall balance assessment: Needs assistance Sitting-balance support: Feet supported;Bilateral upper extremity supported Sitting balance-Leahy Scale: Poor Sitting balance - Comments: needs min to mod assist EOB for support.                                      Pertinent Vitals/Pain Pain Assessment: Faces Faces Pain Scale: No hurt    Home Living Family/patient expects to be discharged to:: Private residence Living Arrangements: Alone Available Help at Discharge: Family;Available PRN/intermittently Type of Home: House(funeral home) Home Access: Stairs to enter Entrance Stairs-Rails: Right Entrance Stairs-Number of Steps: 20 Home Layout: Two level Home Equipment: Cane - single point;Walker - 2 wheels;Wheelchair - manual Additional Comments: Patient states he has access to RW, SPC, W/C, lives in the top floor of a funeral home  (information from [facility] PT)    Prior Function Level of Independence: Independent         Comments: community ambulator, drives     Hand Dominance   Dominant Hand: Right    Extremity/Trunk  Assessment   Upper Extremity Assessment Upper Extremity Assessment: Defer to OT evaluation    Lower Extremity Assessment Lower Extremity Assessment: LLE deficits/detail LLE Deficits / Details: left leg weaker than right leg.  Pt able to move ankle weakly, knee flexion with assistance, and unable to lift leg against gravity in gross bed level assessment.   LLE Sensation: (difficult to assess due to ETT, pt responded to touch)    Cervical / Trunk Assessment Cervical / Trunk Assessment: Normal  Communication   Communication: Other  (comment)(currently intubated with ETT)  Cognition Arousal/Alertness: Awake/alert Behavior During Therapy: Anxious Overall Cognitive Status: Difficult to assess                                 General Comments: Followed basic one step commands without much delay, some slowness to move EOB, but this may have been related to his left sided weakness.  Anxious once up due to ETT in mouth/throat, couging.  Pt able to write answers to questions asked by therapist on paper.      General Comments General comments (skin integrity, edema, etc.): Pt remains on high PEEP settings (PEEP 12), but FiO2 improved to 50% today.  RR 12-24, O2 sats remained stable despite increased distress in sitting.  Pt coughing and seemingly gaging on tube vs mucus in sitting EOB.  RN in room and suctioning (deep and orally), but ultimately in 3 mins pt had to return to supine to decrease distress.  Despite distress, O2 sats remained stable.  BPs in the 140s, HR in the 100s.  Once positioned back in supine with HOB ~40 degrees, he was able to calm down and stop coughing.  MD in room at end of session, hopeful for wean/possible extubation later.          Assessment/Plan    PT Assessment Patient needs continued PT services  PT Problem List Decreased strength;Decreased activity tolerance;Decreased balance;Decreased mobility;Decreased knowledge of use of DME;Cardiopulmonary status limiting activity       PT Treatment Interventions DME instruction;Gait training;Stair training;Functional mobility training;Therapeutic activities;Therapeutic exercise;Balance training;Neuromuscular re-education;Cognitive remediation;Patient/family education    PT Goals (Current goals can be found in the Care Plan section)  Acute Rehab PT Goals Patient Stated Goal: pt has ETT unable to state PT Goal Formulation: Patient unable to participate in goal setting Time For Goal Achievement: 09/15/18 Potential to Achieve Goals: Good     Frequency Min 4X/week   Barriers to discharge Inaccessible home environment Per pt he has two daughters.     Co-evaluation PT/OT/SLP Co-Evaluation/Treatment: Yes Reason for Co-Treatment: Complexity of the patient's impairments (multi-system involvement);For patient/therapist safety;To address functional/ADL transfers           AM-PAC PT "6 Clicks" Mobility  Outcome Measure Help needed turning from your back to your side while in a flat bed without using bedrails?: A Lot Help needed moving from lying on your back to sitting on the side of a flat bed without using bedrails?: A Lot Help needed moving to and from a bed to a chair (including a wheelchair)?: A Lot Help needed standing up from a chair using your arms (e.g., wheelchair or bedside chair)?: A Lot Help needed to walk in hospital room?: Total Help needed climbing 3-5 steps with a railing? : Total 6 Click Score: 10    End of Session Equipment Utilized During Treatment: Oxygen;Other (comment)(ventilator PRVC FiO2 50%, PEEP 12) Activity Tolerance: Treatment limited secondary  to medical complications (Comment);Other (comment)(limited by gag EOB) Patient left: in bed;with call bell/phone within reach;with nursing/sitter in room   PT Visit Diagnosis: Muscle weakness (generalized) (M62.81);Difficulty in walking, not elsewhere classified (R26.2);Hemiplegia and hemiparesis Hemiplegia - Right/Left: Left Hemiplegia - dominant/non-dominant: Non-dominant Hemiplegia - caused by: Cerebral infarction    Time: 5638-9373 PT Time Calculation (min) (ACUTE ONLY): 28 min   Charges:        Wells Guiles B. Mikahla Wisor, PT, DPT  Acute Rehabilitation [phone] pager [phone]) [phone] office   PT Evaluation $PT Eval High Complexity: 1 High         09/01/2018, 12:41 PM

## 2018-09-01 NOTE — Evaluation (Signed)
Occupational Therapy Evaluation Patient Details Name: Clayton [name] MRN: [id number] DOB: Oct 16, 1950 Today's Date: 09/01/2018    History of Present Illness 67 y.o. male admitted to [facility] on 08/24/18 for SOB.  Pt dx with acute on chronic respiratory failure, acute kidney injury, acute gastroenteritis, elevated troponin (thought to be demand ischemia), lactic acidosis, HTN, elevated liver enzymes.  On 08/27/18 pt had sudden onset L facial droop and arm weakness.  CTA revealed R M2 occlusion. Pt transferred emergently to [facility] and IR preformed R common carotid arteriorgram followed by complete revascularization of R MCA M1 on 08/28/18.  Pt failed extubation post operatively and has been ventilated via ETT since revascularization.  Pt with other significant PMH of PVD, prostate CA, HTN, DM, COPD, CAD, cardiac stent placement, and bil cataract surgery.   Clinical Impression   PT admitted with see above. Pt currently with functional limitiations due to the deficits listed below (see OT problem list). Pt currently bed level only total +2 (A) for bed mobility. Pt currently limited by vent but progressing. Pt able to write name and DOB Pt will benefit from skilled OT to increase their independence and safety with adls and balance to allow discharge CIR.     Follow Up Recommendations  CIR    Equipment Recommendations  3 in 1 bedside commode;Wheelchair (measurements OT);Wheelchair cushion (measurements OT)    Recommendations for Other Services Rehab consult     Precautions / Restrictions Precautions Precautions: Fall Precaution Comments: left sided weakness      Mobility Bed Mobility Overal bed mobility: Needs Assistance       Supine to sit: Mod assist;HOB elevated Sit to supine: Mod assist;HOB elevated   General bed mobility comments: Mod assist to support trunk to get to EOB and assist in progressing bil legs to EOB.  Assist needed to support trunk and lift both legs to return to supine.    Transfers                 General transfer comment: Not attempted as he did not tolerate sitting EOB well with ETT, coughing, and gag    Balance Overall balance assessment: Needs assistance Sitting-balance support: Feet supported;Bilateral upper extremity supported Sitting balance-Leahy Scale: Poor Sitting balance - Comments: needs min to mod assist EOB for support.                                    ADL either performed or assessed with clinical judgement   ADL Overall ADL's : Needs assistance/impaired Eating/Feeding: NPO   Grooming: Wash/dry face;Moderate assistance Grooming Details (indicate cue type and reason): requires (A) around the vent Upper Body Bathing: Moderate assistance   Lower Body Bathing: Total assistance Lower Body Bathing Details (indicate cue type and reason): to don socks           Toilet Transfer Details (indicate cue type and reason): bed level eval only           General ADL Comments: pt bed level eval at this time due to vent and incr RR at EOB. RN present adn requesting patient return to supine.      Vision         Perception     Praxis      Pertinent Vitals/Pain Pain Assessment: No/denies pain Faces Pain Scale: No hurt     Hand Dominance Right   Extremity/Trunk Assessment Upper Extremity Assessment Upper Extremity Assessment: Generalized  weakness;RUE deficits/detail RUE Deficits / Details: able to use R UE to write name and DOB correctly   Lower Extremity Assessment Lower Extremity Assessment: Defer to PT evaluation LLE Deficits / Details: left leg weaker than right leg.  Pt able to move ankle weakly, knee flexion with assistance, and unable to lift leg against gravity in gross bed level assessment.   LLE Sensation: (difficult to assess due to ETT, pt responded to touch)   Cervical / Trunk Assessment Cervical / Trunk Assessment: Normal   Communication Communication Communication: Other  (comment)(currently intubated with ETT)   Cognition Arousal/Alertness: Awake/alert Behavior During Therapy: Anxious Overall Cognitive Status: Difficult to assess                                 General Comments: Followed basic one step commands without much delay, some slowness to move EOB, but this may have been related to his left sided weakness.  Anxious once up due to ETT in mouth/throat, couging.  Pt able to write answers to questions asked by therapist on paper.   General Comments  on vent at this time with decr sedation and RN at bedside    Exercises     [place] expects to be discharged to:: Private residence Living Arrangements: Alone Available Help at Discharge: Family;Available PRN/intermittently Type of Home: House(funeral home) Home Access: Stairs to enter Entrance Stairs-Number of Steps: 20 Entrance Stairs-Rails: Right Home Layout: Two level     Bathroom Shower/Tub: Teacher, early years/pre: Standard     Home Equipment: Cane - single point;Walker - 2 wheels;Wheelchair - manual   Additional Comments: Patient states he has access to RW, SPC, W/C, lives in the top floor of a funeral home  (information from [facility] PT)      Prior Functioning/Environment Level of Independence: Independent        Comments: community ambulator, drives        OT Problem List: Decreased strength;Decreased activity tolerance;Impaired balance (sitting and/or standing);Decreased cognition;Decreased safety awareness;Decreased knowledge of precautions;Decreased knowledge of use of DME or AE;Cardiopulmonary status limiting activity      OT Treatment/Interventions: Self-care/ADL training;Therapeutic exercise;Neuromuscular education;Energy conservation;DME and/or AE instruction;Therapeutic activities;Patient/family education;Balance training;Manual therapy    OT Goals(Current goals can be found in the care plan section)  Acute Rehab OT Goals Patient Stated Goal: pt has ETT unable to state OT Goal Formulation: With patient Time For Goal Achievement: 09/15/18 Potential to Achieve Goals: Good  OT Frequency: Min 3X/week   Barriers to D/C: Decreased caregiver support  lives alone       Co-evaluation PT/OT/SLP Co-Evaluation/Treatment: Yes Reason for Co-Treatment: Complexity of the patient's impairments (multi-system involvement);Necessary to address cognition/behavior during functional activity;For patient/therapist safety;To address functional/ADL transfers   OT goals addressed during session: ADL's and self-care;Proper use of Adaptive equipment and DME;Strengthening/ROM      AM-PAC OT "6 Clicks" Daily Activity     Outcome Measure Help from another person eating meals?: A Lot Help from another person taking care of personal grooming?: A Lot Help from another person toileting, which includes using toliet, bedpan, or urinal?: A Lot Help from another person bathing (including washing, rinsing, drying)?: A Lot Help from another person to put on and taking off regular upper body clothing?: A Lot Help from another person to put on and taking off regular lower body clothing?: A Lot 6 Click Score: 12  End of Session Nurse Communication: Mobility status;Precautions(RN in room so all restraints off at this time)  Activity Tolerance: Patient tolerated treatment well Patient left: in bed;with nursing/sitter in room  OT Visit Diagnosis: Unsteadiness on feet (R26.81);Muscle weakness (generalized) (M62.81)                Time: 2449-7530 OT Time Calculation (min): 26 min Charges:  OT General Charges $OT Visit: 1 Visit OT Evaluation $OT Eval High Complexity: 1 High   Jeri Modena, OTR/L  Acute Rehabilitation Services Pager: [phone] Office: [phone] .   Jeri Modena 09/01/2018, 1:48 PM

## 2018-09-01 NOTE — Progress Notes (Signed)
On arrival patient was desynchronous with the vent. Minimal auto peeping noted. Patient trigger sensitivity was decreased and a exhale hold was done on the vent. The problem was fixed and no excessive alarms on the vent noted after the interventions. Patient was suctioned out got back copious amount of tan secretions. PIP pressure decreased after suctioning. No distress or complications noted.

## 2018-09-01 NOTE — Progress Notes (Signed)
Rehab Admissions Coordinator Note:  Patient was screened by Cleatrice Burke for appropriateness for an Inpatient Acute Rehab Consult per PT and OT recs. Noted pt still vented. I will follow his progress to assist with dispo when appropriate.    Danne Baxter, RN, MSN Rehab Admissions Coordinator [phone] 09/01/2018 2:00 PM

## 2018-09-01 NOTE — Progress Notes (Addendum)
NAME:  Clayton [name], MRN:  [id number], DOB:  Jul 28, 1951, LOS: 8 ADMISSION DATE:  08/24/2018, CONSULTATION DATE:  08/28/2018 REFERRING MD:  Dr. Leonel Ramsay, CHIEF COMPLAINT:  Stroke  Brief History   67 yoM originally admitted to [facility] on 12/9 with AECOPD, AKI, dehydration s/p N/V for several days found with acute left sided deficits.  Code stroke initiated.  Found to have acute Right M2 occlusion.  TPA not given.  Transferred emergently to [facility] and taken to Neuro IR.  PCCM consulted for vent management.  History of present illness   HPI obtained from medical chart review as patient is currently intubated and sedated on mechanical ventilation.  67 year old male with history of tobacco abuse, COPD, chronic hypoxic respiratory failure on home O2 2Ls, HTN, HLD, DM, CAD w/prior MI, GERD, and prostate CA initially admitted to [facility] on 12/9 after being found on floor.  Patient complained of progressive SOB x 2 weeks, 1 week history of nausea, vomiting, and diarrhea with poor PO intake, dizziness, and generalized weakness.    Admitted for AECOPD, hypoxia, dehydration, AKI, elevated LFTs, and troponin.  Initial CXR and head CT negative.  Evaluated by Cardiology and empirically treated with heparin until 12/10 after TTE did not show any wall motion abnormality and elevation in troponin thought due to demand ischemia.  He has been treated with steroids, nebs, azithro and HFNC weaned down to 5L [place] as of yesterday. He has been afebrile and normotensive. Negative GI workup thus far (-stool, -RUQ [place], -hepatitis panel).  AKI resolved after IV hydration and transaminitis is improving.   LSW around shift change 1900 on 12/12.  After 2030, found to have left facial droop, aphasia, and left sided weakness.  Code stroke activated and assessed by tele Neuro.  NIHSS 16. CT head negative.  Decision made not to give TPA due to elevated PT/INR 15.9/ 1.28.  CTA head/ neck noted for acute right MCA occlusion.    Patient was emergently transferred to [facility] and taken to neuro IR for embolectomy.  Intubated for procedure and will return to ICU on ventilator.  Extubation deferred given ongoing AECOPD.  Therefore, PCCM consulted for further medical and ventilator management in ICU.  Past Medical History   tobacco abuse, COPD, chronic hypoxic respiratory failure on home O2 2Ls, HTN, HLD, DM, CAD w/prior MI, GERD, and prostate [facility] Events   12/9 Admit at [facility] 12/13 tx to [facility]  Consults:   Procedures:  12/13 cerebral angiogram  12/13 ETT >>  Significant Diagnostic Tests:  12/9 [facility]  >> neg 12/10 TTE >> EF 50-55%, G1DD, mild AS, mod TR, PAP 57 mmHg 12/12 CTH >> neg; ASPECTS 10  12/12 CTA head/neck >> 1. Right M2 inferior division origin occlusion and nonocclusive thrombus of distal right M1. Right M2 thrombus approximately 13 mm in length. Good downstream collateralization. 2. Negative CT brain perfusion for acute stroke by perfusion criteria. Mild asymmetry of transit time, increased in the right posterior cerebral hemisphere, likely related to M2 thrombus. 3. Patent carotid and vertebral arteries of the neck. No dissection, aneurysm, or significant stenosis by NASCET criteria. 4. No additional intracranial large vessel occlusion, aneurysm, or vascular malformation. 5. Mild right vertebral artery origin stenosis with calcified plaque. 6. Mild right proximal ICA stenosis with calcified plaque. 7. Mild right and moderate left cavernous and paraclinoid ICA stenosis with calcified plaque.  12/13 CTH >> 1. Hypoattenuation within the right lentiform nucleus spanning up to approximately 3.4  cm (5 cc) likely representing an acute or subacute infarction. No associated hemorrhage or mass effect. Increased conspicuity from prior CT of head. 2. ASPECTS is 9  12/13 CT cerebral perfusion >>  1. Right lentiform nucleus infarction, ASPECTS 9 on noncontrast CT of head. Pseudo normalized  perfusion associated with this area visible infarction. 2. No additional region of acute infarction by perfusion criteria identified. 3. Mildly increased transit time in the right posterior cerebral hemisphere likely related collateralization of right M2 inferior division occlusion.  Micro Data:  12/10 MRSA PCR  >> neg 12/10 Cdiff  >> neg 12/10 GI panel >> neg  Antimicrobials:  12/9 azithro >> 12/13 ancef pre op  Interim history/subjective:  Remains on high PEEP 12 , FiO2 weaned to 50% No events overnight  Objective   Blood pressure (!) 142/74, pulse 66, temperature 98.4 F (36.9 C), resp. rate 16, height 6' (1.829 m), weight 94.5 kg, SpO2 97 %.    Vent Mode: PRVC FiO2 (%):  [50 %-70 %] 50 % Set Rate:  [16 bmp] 16 bmp Vt Set:  [620 mL-650 mL] 620 mL PEEP:  [12 cmH20] 12 cmH20 Plateau Pressure:  [22 cmH20-26 cmH20] 26 cmH20   Intake/Output Summary (Last 24 hours) at 09/01/2018 0917 Last data filed at 09/01/2018 0600 Gross per 24 hour  Intake 1813.73 ml  Output 2025 ml  Net -211.27 ml   Filed Weights   08/29/18 0500 08/30/18 0458 09/01/18 0500  Weight: 96 kg 96.6 kg 94.5 kg   Examination: General: Chronically ill appearing male, sedated and intubated, arouses HENT: [place]/AT, PERRL, EOM-I and MMM, ETT in place, no LAD, No JVD Lungs: Bilateral chest excursion, Decreased BS per bases, no wheezing  Cardiovascular:S1, S2, RRR, No RMG Abdomen: soft, NT, ND and +BS, obese Extremities: no obvious deformities, warm and dry Neuro: Sedated and intubated, arouses, nods head and follows simple commands Skin: No rash, warm and dry  CTA negative for PE  Resolved Hospital Problem list    Assessment & Plan:  Right MCA stroke s/p EVR and complete revascularization, was extubated in the PACU post op but mental status was poor, unable to protect his airway and was reintubated.  Acute right MCA embolic stroke, L sided weakness , per nursing improving Plan:  ASA per neuro Neurology  following Minimize sedation as able  Acute respiratory failure with hypoxemia: COPD PAH + 4 L CT with no PE Remains on high PEEP and FIO2 CXR 12/17>> Bibasilar atelectasis or pneumonia. Small left pleural effusion. Top-normal cardiac size without pulmonary vascular congestion. Plan Echo results as noted above Wean PEEP and FiO2 as able Full vent support for now until PEEP and FiO2 weaned Minimize sedation CXR prn ABG prn  ID T Max 100.9 WBC WNL Plan Trend fever curve and WBC Culture as is clinically indicated  COPD exacerbation: Plan Decrease solumedrol to 20 q12 x4 doses then D/C  As no wheezing on exam Titrate O2 for sat of 88-92% Minimize sedation  Hyperglycemia Plan CBG Q 4 Sliding scale insulin Lantus 25  History CAD and PTCI Plan Aspirin as above EKG prn Tele monitoring  HTN: No acute issues Plan  Continue carvedilol Cleviprex  Remains  off   Hyperkalemia: resolving Follow serial BMP Monitor UO  Monitor and Replete electrolytes prn  Nutrition Plan Tube feedings initiated Will add LOC, per nursing, no BM   Labs   CBC: Recent Labs  Lab 08/28/18 1002 08/29/18 0402 08/30/18 0519 08/31/18 0331 09/01/18 0322  WBC 5.9 6.4  8.3 9.2 9.3  NEUTROABS 5.0  --   --   --   --   HGB 11.3* 11.7* 11.9* 12.0* 11.8*  HCT 38.9* 39.0 38.4* 37.6* 36.7*  MCV 95.3 94.2 92.8 90.4 90.8  PLT 190 180 177 160 782    Basic Metabolic Panel: Recent Labs  Lab 08/28/18 1002 08/29/18 0402 08/29/18 1638 08/30/18 0519 08/30/18 2055 08/31/18 0331 09/01/18 0322  NA 137 137  --  137  --  139 138  K 5.5* 5.1  --  5.1  --  4.9 4.6  CL 104 103  --  103  --  103 100  CO2 25 27  --  25  --  27 28  GLUCOSE 138* 236*  --  241*  --  176* 174*  BUN 28* 30*  --  33*  --  33* 34*  CREATININE 0.70 0.72  --  0.76  --  0.72 0.69  CALCIUM 9.0 9.0  --  8.9  --  9.1 9.0  MG 1.5* 1.7 1.5* 1.6* 1.6*  --  1.7  PHOS 3.1 3.5 2.9 3.0 3.0  --  3.2   GFR: Estimated Creatinine  Clearance: 108.4 mL/min (by C-G formula based on SCr of 0.69 mg/dL). Recent Labs  Lab 08/29/18 0402 08/30/18 0519 08/31/18 0331 09/01/18 0322  WBC 6.4 8.3 9.2 9.3    Liver Function Tests: Recent Labs  Lab 08/26/18 0409 08/27/18 0403  AST 208* 190*  ALT 351* 375*  ALKPHOS 116 105  BILITOT 2.1* 1.8*  PROT 7.7 7.2  ALBUMIN 3.5 3.4*   No results for input(s): LIPASE, AMYLASE in the last 168 hours. No results for input(s): AMMONIA in the last 168 hours.  ABG    Component Value Date/Time   PHART 7.449 09/01/2018 0328   PCO2ART 42.6 09/01/2018 0328   PO2ART 81.0 (L) 09/01/2018 0328   HCO3 29.5 (H) 09/01/2018 0328   TCO2 31 09/01/2018 0328   ACIDBASEDEF 3.0 (H) 08/28/2018 0559   O2SAT 96.0 09/01/2018 0328     Coagulation Profile: Recent Labs  Lab 08/27/18 2111  INR 1.28    Cardiac Enzymes: No results for input(s): CKTOTAL, CKMB, CKMBINDEX, TROPONINI in the last 168 hours.  HbA1C: Hgb A1c MFr Bld  Date/Time Value Ref Range Status  08/28/2018 10:02 AM 5.9 (H) 4.8 - 5.6 % Final    Comment:    (NOTE) Pre diabetes:          5.7%-6.4% Diabetes:              >6.4% Glycemic control for   <7.0% adults with diabetes   05/13/2017 05:10 PM 6.4 (H) 4.8 - 5.6 % Final    Comment:    (NOTE) Pre diabetes:          5.7%-6.4% Diabetes:              >6.4% Glycemic control for   <7.0% adults with diabetes     CBG: Recent Labs  Lab 08/31/18 1522 08/31/18 2010 09/01/18 0042 09/01/18 0422 09/01/18 0753  GLUCAP 127* 109* 181* 161* 163*   Magdalen Spatz, AGACNP-BC [facility]. Pager: [phone].  09/01/2018 9:18 AM  Attending Note:  67 year old male with PMH above presenting with CVA and respiratory failure who was intubated for respiratory failure due to inability to protect his airway.  Patient diuresed some yesterday and was able to cooperate with PT.  He was able to wean some on exam with crackles diffusely.  I  reviewed CXR  myself, ETT is in a good position.  Discussed with PCCM-NP.  Will proceed with weaning as able.  Hold off extubation.  Will need to speak with family regarding trach/peg if fails extubation again.  Diureses today.  PCCM will continue to follow.  The patient is critically ill with multiple organ systems failure and requires high complexity decision making for assessment and support, frequent evaluation and titration of therapies, application of advanced monitoring technologies and extensive interpretation of multiple databases.   Critical Care Time devoted to patient care services described in this note is  34  Minutes. This time reflects time of care of this signee Dr [name]. This critical care time does not reflect procedure time, or teaching time or supervisory time of PA/NP/Med student/Med Resident etc but could involve care discussion time.  Rush Farmer, M.D. [facility] Pulmonary/Critical Care Medicine. Pager: [phone]. After hours pager: [phone].

## 2018-09-01 NOTE — Progress Notes (Signed)
[place] Progress Note Patient Name: Abdel Effinger DOB: [birth year] MRN: [id number]   Date of Service  09/01/2018  HPI/Events of Note  Patient stacking breaths on mechanical ventilation. Patient on Propofol IV infusion at low dose.   eICU Interventions  Will order: 1. Versed 1 mg IV Q 2 hours PRN ventilator dyssynchrony.      Intervention Category Major Interventions: Other:;Respiratory failure - evaluation and management  Sommer,Steven Eugene 09/01/2018, 2:52 AM

## 2018-09-01 NOTE — Progress Notes (Signed)
  Echocardiogram 2D Echocardiogram has been performed.  Clayton [name] 09/01/2018, 11:50 AM

## 2018-09-02 ENCOUNTER — Inpatient Hospital Stay (HOSPITAL_COMMUNITY): Payer: Medicare Other

## 2018-09-02 DIAGNOSIS — Z930 Tracheostomy status: Secondary | ICD-10-CM

## 2018-09-02 DIAGNOSIS — J9601 Acute respiratory failure with hypoxia: Secondary | ICD-10-CM

## 2018-09-02 LAB — GLUCOSE, CAPILLARY
GLUCOSE-CAPILLARY: 140 mg/dL — AB (ref 70–99)
GLUCOSE-CAPILLARY: 130 mg/dL — AB (ref 70–99)
Glucose-Capillary: 117 mg/dL — ABNORMAL HIGH (ref 70–99)
Glucose-Capillary: 129 mg/dL — ABNORMAL HIGH (ref 70–99)
Glucose-Capillary: 134 mg/dL — ABNORMAL HIGH (ref 70–99)
Glucose-Capillary: 157 mg/dL — ABNORMAL HIGH (ref 70–99)
Glucose-Capillary: 200 mg/dL — ABNORMAL HIGH (ref 70–99)

## 2018-09-02 LAB — BLOOD GAS, ARTERIAL
ACID-BASE EXCESS: 5.7 mmol/L — AB (ref 0.0–2.0)
Bicarbonate: 29.5 mmol/L — ABNORMAL HIGH (ref 20.0–28.0)
Drawn by: 44166
FIO2: 40
MECHVT: 620 mL
O2 Saturation: 90.2 %
PEEP: 5 cmH2O
PH ART: 7.468 — AB (ref 7.350–7.450)
Patient temperature: 98.6
RATE: 16 resp/min
pCO2 arterial: 41.3 mmHg (ref 32.0–48.0)
pO2, Arterial: 60 mmHg — ABNORMAL LOW (ref 83.0–108.0)

## 2018-09-02 LAB — POCT I-STAT 3, ART BLOOD GAS (G3+)
Acid-Base Excess: 5 mmol/L — ABNORMAL HIGH (ref 0.0–2.0)
Bicarbonate: 30.8 mmol/L — ABNORMAL HIGH (ref 20.0–28.0)
O2 Saturation: 99 %
TCO2: 32 mmol/L (ref 22–32)
pCO2 arterial: 49.2 mmHg — ABNORMAL HIGH (ref 32.0–48.0)
pH, Arterial: 7.405 (ref 7.350–7.450)
pO2, Arterial: 160 mmHg — ABNORMAL HIGH (ref 83.0–108.0)

## 2018-09-02 LAB — BASIC METABOLIC PANEL
Anion gap: 8 (ref 5–15)
BUN: 31 mg/dL — ABNORMAL HIGH (ref 8–23)
CO2: 28 mmol/L (ref 22–32)
Calcium: 9 mg/dL (ref 8.9–10.3)
Chloride: 99 mmol/L (ref 98–111)
Creatinine, Ser: 0.76 mg/dL (ref 0.61–1.24)
GFR calc Af Amer: >60 mL/min (ref >60)
GFR calc non Af Amer: >60 mL/min (ref >60)
Glucose, Bld: 155 mg/dL — ABNORMAL HIGH (ref 70–99)
Potassium: 5.3 mmol/L — ABNORMAL HIGH (ref 3.5–5.1)
Sodium: 135 mmol/L (ref 135–145)

## 2018-09-02 LAB — PHOSPHORUS: Phosphorus: 3.4 mg/dL (ref 2.5–4.6)

## 2018-09-02 LAB — CBC
HCT: 38.5 % — ABNORMAL LOW (ref 39.0–52.0)
Hemoglobin: 12.5 g/dL — ABNORMAL LOW (ref 13.0–17.0)
MCH: 30 pg (ref 26.0–34.0)
MCHC: 32.5 g/dL (ref 30.0–36.0)
MCV: 92.3 fL (ref 80.0–100.0)
Platelets: 150 10*3/uL (ref 150–400)
RBC: 4.17 MIL/uL — ABNORMAL LOW (ref 4.22–5.81)
RDW: 14.9 % (ref 11.5–15.5)
WBC: 10.7 10*3/uL — ABNORMAL HIGH (ref 4.0–10.5)
nRBC: 0 % (ref 0.0–0.2)

## 2018-09-02 LAB — MAGNESIUM: Magnesium: 1.8 mg/dL (ref 1.7–2.4)

## 2018-09-02 LAB — PROTIME-INR
INR: 1.1
Prothrombin Time: 14.1 seconds (ref 11.4–15.2)

## 2018-09-02 MED ORDER — ROCURONIUM BROMIDE 50 MG/5ML IV SOLN
50.0000 mg | Freq: Once | INTRAVENOUS | Status: AC
  Administered 2018-09-02: 50 mg via INTRAVENOUS

## 2018-09-02 MED ORDER — MIDAZOLAM HCL 2 MG/2ML IJ SOLN
2.0000 mg | Freq: Once | INTRAMUSCULAR | Status: AC
  Administered 2018-09-02: 2 mg via INTRAVENOUS

## 2018-09-02 MED ORDER — MIDAZOLAM HCL 2 MG/2ML IJ SOLN
INTRAMUSCULAR | Status: AC
  Filled 2018-09-02: qty 2

## 2018-09-02 MED ORDER — ETOMIDATE 2 MG/ML IV SOLN: 0.3000 mg/kg | Freq: Once | INTRAVENOUS | Status: DC

## 2018-09-02 MED ORDER — PRO-STAT SUGAR FREE PO LIQD
30.0000 mL | Freq: Every day | ORAL | Status: DC
  Administered 2018-09-04 – 2018-09-05 (×2): 30 mL
  Filled 2018-09-02 (×2): qty 30

## 2018-09-02 MED ORDER — FENTANYL CITRATE (PF) 100 MCG/2ML IJ SOLN: 100.0000 ug | Freq: Once | INTRAMUSCULAR | Status: DC

## 2018-09-02 MED ORDER — DEXMEDETOMIDINE HCL IN NACL 400 MCG/100ML IV SOLN
0.4000 ug/kg/h | INTRAVENOUS | Status: DC
  Administered 2018-09-02: 0.4 ug/kg/h via INTRAVENOUS
  Filled 2018-09-02: qty 100

## 2018-09-02 MED ORDER — ASPIRIN 81 MG PO CHEW
81.0000 mg | CHEWABLE_TABLET | Freq: Every day | ORAL | Status: DC
  Administered 2018-09-02 – 2018-09-05 (×3): 81 mg
  Filled 2018-09-02 (×2): qty 1

## 2018-09-02 MED ORDER — FENTANYL CITRATE (PF) 100 MCG/2ML IJ SOLN
200.0000 ug | Freq: Once | INTRAMUSCULAR | Status: AC
  Administered 2018-09-02: 100 ug via INTRAVENOUS

## 2018-09-02 MED ORDER — VITAL AF 1.2 CAL PO LIQD
1000.0000 mL | ORAL | Status: DC
  Administered 2018-09-04 – 2018-09-05 (×2): 1000 mL

## 2018-09-02 MED ORDER — FENTANYL CITRATE (PF) 100 MCG/2ML IJ SOLN
INTRAMUSCULAR | Status: AC
  Filled 2018-09-02: qty 2

## 2018-09-02 MED ORDER — MIDAZOLAM HCL 2 MG/2ML IJ SOLN: 5.0000 mg | Freq: Once | INTRAMUSCULAR | Status: DC

## 2018-09-02 MED ORDER — PROPOFOL 1000 MG/100ML IV EMUL
INTRAVENOUS | Status: AC
  Administered 2018-09-02: 16:00:00
  Filled 2018-09-02: qty 100

## 2018-09-02 MED ORDER — ETOMIDATE 2 MG/ML IV SOLN
40.0000 mg | Freq: Once | INTRAVENOUS | Status: AC
  Administered 2018-09-02: 20 mg via INTRAVENOUS

## 2018-09-02 MED ORDER — PROPOFOL 10 MG/ML IV BOLUS
100.0000 mg | Freq: Once | INTRAVENOUS | Status: AC
  Administered 2018-09-02: 100 mg via INTRAVENOUS

## 2018-09-02 MED ORDER — VECURONIUM BROMIDE 10 MG IV SOLR: 10.0000 mg | Freq: Once | INTRAVENOUS | Status: DC

## 2018-09-02 NOTE — Procedures (Signed)
Extubation Procedure Note  Patient Details:   Name: Jakeim Sedore DOB: Jul 04, 1951 MRN: [id number]   Airway Documentation:    Vent end date: 09/02/18 Vent end time: 1402   Evaluation  O2 sats: stable throughout Complications: No apparent complications Patient did tolerate procedure well. Bilateral Breath Sounds: Diminished   Yes   Patient extubated per order to 5L [place] with no apparent complications. Positive cuff leak was noted prior to extubation. Patient is alert and oriented and is able to weakly speak. Vitals are stable. RT will continue to monitor.   Kelee Cunningham Clyda Greener 09/02/2018, 2:11 PM

## 2018-09-02 NOTE — Procedures (Signed)
Bedside Tracheostomy Insertion Procedure Note   Patient Details:   Name: Clayton [name] DOB: 02/03/1951 MRN: [id number]  Procedure: Tracheostomy  Pre Procedure Assessment: ET Tube Size: 8.0 ET Tube secured at lip (cm): 24 Bite block in place: Yes Breath Sounds: Clear  Post Procedure Assessment: BP 134/68   Pulse 79   Temp 98.2 F (36.8 C) (Oral)   Resp (!) 24   Ht 6' (1.829 m)   Wt 94.9 kg   SpO2 (!) 88%   BMI 28.37 kg/m  O2 sats: stable throughout Complications: No apparent complications Patient did tolerate procedure well Tracheostomy Brand:Shiley Tracheostomy Style:Cuffed Tracheostomy Size: 6.0 Tracheostomy Secured LFY:[id number] Tracheostomy Placement Confirmation:Trach cuff visualized and in place    Phillis Knack [facility] 09/02/2018, 4:38 PM

## 2018-09-02 NOTE — Progress Notes (Signed)
[place] Progress Note Patient Name: Clayton [name] DOB: 06-03-1951 MRN: [id number]   Date of Service  09/02/2018  HPI/Events of Note  RN called to report the patient had a coughing spell bringing up white secretions.    PT has OGT with tube feeds running.  Pt is intubated.   eICU Interventions  Likelihood of aspiration is slim.  Can restart tube feeds.      Intervention Category Evaluation Type: New Patient Evaluation  Elsie Lincoln 09/02/2018, 1:34 AM

## 2018-09-02 NOTE — Care Management Note (Signed)
Case Management Note Manya Silvas, RN MSN CCM Transitions of Care [place] [phone]  Patient Details  Name: Clayton [name] MRN: [id number] Date of Birth: 1950/10/27  Subjective/Objective:        Elevated LFTs -advanced COPD         Action/Plan: PTA home. Transfer from [facility]. 12/18 Extubated. PT/OT recommending CIR. CIR coordinator, Pamala Hurry, notified of extubation in order to complete screening. CSW, Shelton Silvas, notified for potential back up transition. Will continue to follow for transition of care needs.   Expected Discharge Date:                  Expected Discharge Plan:  Skilled Nursing Facility  In-House Referral:  Clinical Social Work  Discharge planning Services  CM Consult  Post Acute Care Choice:    Choice offered to:     DME Arranged:    DME Agency:     HH Arranged:    [place] Agency:     Status of Service:  In process, will continue to follow  If discussed at Long Length of Stay Meetings, dates discussed:    Additional Comments:  Bartholomew Crews, RN 09/02/2018, 3:29 PM

## 2018-09-02 NOTE — Progress Notes (Signed)
PT Cancellation Note  Patient Details Name: Morell Mears MRN: [id number] DOB: 11-18-1950   Cancelled Treatment:     Attempted to see patient this afternoon with therapy tech, pt currently in  Bedside procedure for tracheostomy, Will cont to follow.  Reinaldo Berber, PT, DPT Acute Rehabilitation Services Pager: [phone] Office: [place] 09/02/2018, 4:30 PM

## 2018-09-02 NOTE — Procedures (Signed)
Percutaneous Tracheostomy Placement  Consent from family.  Patient sedated, paralyzed and position.  Placed on 100% FiO2 and RR matched.  Area cleaned and draped.  Lidocaine/epi injected.  Skin incision done followed by blunt dissection.  Venous bleed noted on the right side of the incision that was sutured and stabilized.  Trachea palpated then punctured, catheter passed and visualized bronchoscopically.  Wire placed and visualized.  Catheter removed.  Airway then crushed and dilated.  Size 6 cuffed shiley trach placed and visualized bronchoscopically well above carina.  Good volume returns.  Patient tolerated the procedure well without complications.  Minimal blood loss.  CXR ordered and pending.  Rush Farmer, M.D. [facility] Pulmonary/Critical Care Medicine. Pager: [phone]. After hours pager: [phone].

## 2018-09-02 NOTE — Progress Notes (Signed)
STROKE TEAM PROGRESS NOTE   SUBJECTIVE (INTERVAL HISTORY) Patient sister and RN are at bedside. The patient remains intubated, however, plan to extubate soon.  Patient awake alert, follows commands.  Still has left-sided weakness, but seems improving.   OBJECTIVE Vitals:   09/02/18 1430 09/02/18 1431 09/02/18 1439 09/02/18 1446  BP: (!) 150/81   134/68  Pulse: 78 78 77 79  Resp: (!) 29 (!) 33 (!) 31 (!) 24  Temp:      TempSrc:      SpO2: 91% 90% 90% (!) 88%  Weight:      Height:        CBC:  Recent Labs  Lab 08/28/18 1002  09/01/18 0322 09/02/18 0329  WBC 5.9   < > 9.3 10.7*  NEUTROABS 5.0  --   --   --   HGB 11.3*   < > 11.8* 12.5*  HCT 38.9*   < > 36.7* 38.5*  MCV 95.3   < > 90.8 92.3  PLT 190   < > 159 150   < > = values in this interval not displayed.    Basic Metabolic Panel:  Recent Labs  Lab 09/01/18 0322 09/02/18 0329  NA 138 135  K 4.6 5.3*  CL 100 99  CO2 28 28  GLUCOSE 174* 155*  BUN 34* 31*  CREATININE 0.69 0.76  CALCIUM 9.0 9.0  MG 1.7 1.8  PHOS 3.2 3.4    Lipid Panel:     Component Value Date/Time   CHOL 112 08/28/2018 1002   TRIG 32 08/31/2018 0630   HDL 32 (L) 08/28/2018 1002   CHOLHDL 3.5 08/28/2018 1002   VLDL 15 08/28/2018 1002   LDLCALC 65 08/28/2018 1002   HgbA1c:  Lab Results  Component Value Date   HGBA1C 5.9 (H) 08/28/2018   Urine Drug Screen:     Component Value Date/Time   LABOPIA NONE DETECTED 08/28/2018 1816   COCAINSCRNUR NONE DETECTED 08/28/2018 1816   LABBENZ NONE DETECTED 08/28/2018 1816   AMPHETMU NONE DETECTED 08/28/2018 1816   THCU NONE DETECTED 08/28/2018 1816   LABBARB NONE DETECTED 08/28/2018 1816    Alcohol Level No results found for: ETH  IMAGING  Ct Angio Head W Or Wo Contrast Ct Angio Neck W Or Wo Contrast Ct Cerebral Perfusion W Contrast 08/28/2018 IMPRESSION:  1. Right M2 inferior division origin occlusion and nonocclusive thrombus of distal right M1. Right M2 thrombus approximately 13 mm  in length. Good downstream collateralization.  2. Negative CT brain perfusion for acute stroke by perfusion criteria. Mild asymmetry of transit time, increased in the right posterior cerebral hemisphere, likely related to M2 thrombus.  3. Patent carotid and vertebral arteries of the neck. No dissection, aneurysm, or significant stenosis by NASCET criteria.  4. No additional intracranial large vessel occlusion, aneurysm, or vascular malformation.  5. Mild right vertebral artery origin stenosis with calcified plaque.  6. Mild right proximal ICA stenosis with calcified plaque.  7. Mild right and moderate left cavernous and paraclinoid ICA stenosis with calcified plaque.    Ct Cerebral Perfusion W Contrast 08/28/2018 IMPRESSION:  1. Right lentiform nucleus infarction, ASPECTS 9 on noncontrast CT of head. Pseudo normalized perfusion associated with this area visible infarction.  2. No additional region of acute infarction by perfusion criteria identified.  3. Mildly increased transit time in the right posterior cerebral hemisphere likely related collateralization of right M2 inferior division occlusion.  Ct Head Code Stroke Wo Contrast 08/28/2018 IMPRESSION:  1. Hypoattenuation within  the right lentiform nucleus spanning up to approximately 3.4 cm (5 cc) likely representing an acute or subacute infarction. No associated hemorrhage or mass effect. Increased conspicuity from prior CT of head.  2. ASPECTS is 9    Ct Head Code Stroke Wo Contrast` 08/27/2018 IMPRESSION:  1. No acute hemorrhage.  2. ASPECTS is 10.    Cerebral Angiogram S/P RT common carotid arteriogram followed by complete revascularization of occluded distal RT MCA M1 and co dominant inf division withx 1 pass with 68mm x 14mm embotrap retriever device achieving a TICI 3 revascularization  Transthoracic Echocardiogram  08/25/2018 Study Conclusions - Left ventricle: The cavity size was normal. There was mild   concentric  hypertrophy. Systolic function was normal. The   estimated ejection fraction was in the range of 50% to 55%. There   was septal-lateral dyssynchrony. Doppler parameters are   consistent with abnormal left ventricular relaxation (grade 1   diastolic dysfunction). - Ventricular septum: Septal motion showed abnormal function and   dyssynergy. These changes are consistent with intraventricular   conduction delay. - Aortic valve: Trileaflet; mildly thickened, moderately calcified   leaflets. Morphologically, there appears to be mild calcific   aortic stenosis. - Mitral valve: Mildly calcified annulus. - Right ventricle: The cavity size was mildly dilated. Systolic   function was moderately reduced. - Right atrium: The atrium was mildly dilated. - Tricuspid valve: There was moderate regurgitation. - Pulmonary arteries: PA peak pressure: 57 mm Hg (S). - Inferior vena cava: The vessel was dilated. The respirophasic   diameter changes were blunted (< 50%), consistent with elevated   central venous pressure. Estimated CVP 15 mmHg.   Ct Head Wo Contrast 08/29/2018 IMPRESSION:  1. Evolving acute RIGHT basal ganglia nonhemorrhagic infarct with petechial hemorrhage. Additional small acute infarcts better demonstrated on prior MRI.  2. Acute pontine lacunar infarct versus artifact.   3. Moderate to severe chronic small vessel ischemic changes, advanced for age.  4. Moderate parenchymal brain volume loss, advanced for age.    Mr Brain Wo Contrast 08/28/2018 IMPRESSION:  Areas of acute infarction within the right middle cerebral artery territory as outlined above. Largest region affects the corpus striatum of the basal ganglia on the right. Petechial blood products present in that location. Scattered other few small cortical infarctions within the temporal and parietal lobes. Minimal petechial blood products associated with the small posterior parietal cortical infarctions.    LE venous doppler  no DVT    PHYSICAL EXAM Blood pressure 134/68, pulse 79, temperature 98.7 F (37.1 C), temperature source Oral, resp. rate (!) 24, height 6' (1.829 m), weight 94.9 kg, SpO2 (!) 88 %.   Elderly middle-aged African-American male who is still intubated and off sedation. Afebrile. Head is nontraumatic. Neck is supple without bruit.    Cardiac exam no murmur or gallop. Lungs are clear to auscultation. Distal pulses are well felt.  Neurological Exam Patient is still intubated and off Precedex. He is awake and alert and following all simple commands. Eyes open.  Pupil bilateral equal size, EOMI, blinking to visual threat bilaterally.  Positive gag and cough, positive corneal.  Fundi were not visualized.  Facial symmetry not able to test due to ET tube. Tongue midline. Moves right upper extremity spontaneously and against gravity, 3/5 LUE proximal but 0/5 distal. BLE proximal 0/5 proximal but knee flexion 3/5 on the right and 2/5 on the left, bilateral foot DF 3/5 on the right , minimal movement on the left. DTR diminished bilaterally. Babinski  negative bilateral. Sensation, coordination and gait not tested.  ASSESSMENT/PLAN Clayton [name] is a 67 y.o. male with history of coronary artery disease previous MI / stenting, COPD, diabetes mellitus, gastroesophageal reflux disease, hypertension, hyperlipidemia, prostate cancer, and peripheral vascular disease presenting with left-sided weakness. He did not receive IV t-PA due to elevated protime.  Stroke: Rt MCA, MCA/ACA, MCA/PCA infarcts - embolic - source unknown, large vessel atherosclerosis versus cardioembolic source  Resultant  Left hemiparesis  CT head - Hypoattenuation within the right lentiform nucleus likely representing an acute or subacute infarction.  CTA H&N - Right M2 inferior division origin occlusion and nonocclusive thrombus of distal right M1. Right M2 thrombus approximately 13 mm in length.   DSA right occluded M1 and M2 status  post IR with TICI3 reperfusion  MRI head - right MCA, MCA/ACA, MCA/PCA infarcts with right BG petechial hemorrhagic transformation  CT repeat right MCA infarct with petechial hemorrhage at right BG  LE venous Doppler no DVT  2D Echo  - EF 50 - 55%. No cardiac source of emboli identified.   Consider TEE/loop recorder to evaluate cardioembolic source once stable  LDL - 65  HgbA1c - 5.9  UDS - negative  VTE prophylaxis - [employer] Heparin  Diet - NPO  No antithrombotic prior to admission, now on aspirin 81 mg daily.    Patient will be counseled to be compliant with his antithrombotic medications  Ongoing aggressive stroke risk factor management  Therapy recommendations: CIR  Disposition:  Pending  Respiratory failure  Intubated o off sedation  CCM on board  PEEP down from 12 to 5   plan for extubation soon  CTA chest no PE  Still on steroids  Hypertension  Stable . BP goal < 160 . Labetalol PRN . Long-term BP goal normotensive  Diabetes  HgbA1c 5.9, goal < 7.0  Controlled  Hyperglycemia much improved  On Lantus 25U  SSI  CBG monitoring  Tobacco abuse  Current smoker  Smoking cessation counseling provided  On nicotine patch  Pt is willing to quit  Other Stroke Risk Factors  Advanced age  Coronary artery disease/MI status post stent 2009  PVD  Other Active Problems  Hyperkalemia - 5.5 -> Kayexalate per CCM-> 5.1->5.1->4.9-4.6--5.3  Prostate cancer  Acute gastroenteritis, resolved  AKI, resolved  Leukocytosis WBC 9.3-10.7  Hospital day # 9  This patient is critically ill and at significant risk of neurological worsening, death and care requires constant monitoring of vital signs, hemodynamics,respiratory and cardiac monitoring, extensive review of multiple databases, frequent neurological assessment, discussion with family, other specialists and medical decision making of high complexity. I spent 30 minutes of neurocritical care  time  in the care of  this patient.   Rosalin Hawking, MD PhD Stroke Neurology 09/02/2018 2:53 PM    To contact Stroke Continuity provider, please refer to [email or web address] After hours, contact General Neurology

## 2018-09-02 NOTE — Progress Notes (Addendum)
Pt vent alarming. Upon entering room pt coughing with copious thick white secretions coming from mouth. TF turned off. Pt suctioned, no secretions in line. Pt sats dropped to 88% nonsustained while coughing. MD paged. RN left room to get wash clothes and upon returning pt still coughing with more secretions pouring down pts mouth. Pt denied nausea. PRN zofran given prohylactically . MD advised to hold TF for a hr.  Will continue to monitor.

## 2018-09-02 NOTE — Procedures (Signed)
Intubation Procedure Note Clayton [name] [id number] [birth year]  Procedure: Intubation Indications: Airway protection and maintenance  Procedure Details Consent: Risks of procedure as well as the alternatives and risks of each were explained to the (patient/caregiver).  Consent for procedure obtained. Time Out: Verified patient identification, verified procedure, site/side was marked, verified correct patient position, special equipment/implants available, medications/allergies/relevent history reviewed, required imaging and test results available.  Performed  Maximum sterile technique was used including antiseptics, gloves, hand hygiene and mask.  MAC    Evaluation Hemodynamic Status: BP stable throughout; O2 sats: stable throughout Patient's Current Condition: stable Complications: No apparent complications Patient did tolerate procedure well. Chest X-ray ordered to verify placement.  CXR: pending.   Clayton [name] 09/02/2018

## 2018-09-02 NOTE — Procedures (Signed)
Bronchoscopy Procedure Note Farhad Burleson [id number] [birth year]  Procedure: Bronchoscopy Indications: Diagnostic evaluation of the airways  Procedure Details Consent: Risks of procedure as well as the alternatives and risks of each were explained to the (patient/caregiver).  Consent for procedure obtained. Time Out: Verified patient identification, verified procedure, site/side was marked, verified correct patient position, special equipment/implants available, medications/allergies/relevent history reviewed, required imaging and test results available.  Performed  In preparation for procedure, patient was given 100% FiO2 and bronchoscope lubricated. Sedation: Benzodiazepines, Muscle relaxants and Etomidate   Procedure done by P Tilia Faso ACNP-BC, under direct supervision of Dr Nelda Marseille. At first bronch was introduce through ET tube and structures of tracheal rings, carina identified for operator of tracheostomy who was Dr Nelda Marseille. Light of bronch passed through trachea and skin for indentification of tracheal rings for tracheostomy puncture. After this, under bronchoscopy guidance,  ET tube was pulled back sufficiently and very carefully. The ET tube was  pulled back enough to give room for tracheostomy operator and yet at same time to to ensure a secured airway. After this was accomplished, bronchoscope was withdrawn into the ET tube. After this,  Dr Nelda Marseille then performed tracheostomy under video visual provided by flexible video bronchoscopy. Followng introduction of tracheostomy,  the bronchoscope was removed from ET tube and introduced through tracheostomy. Correct position of tracheostomy was ensured, with enough room between carina and distal tracheostomy and no evidence of bleeding. The bronchoscope was then withdrawn. Respiratory therapist was then instructed to remove the ET tube.  Dr Nelda Marseille  then proceeded to complete the tracheostomy with stay sutures   No complications   Evaluation Hemodynamic Status: BP stable throughout; O2 sats: stable throughout Patient's Current Condition: stable Specimens:  None Complications: No apparent complications Patient did tolerate procedure well.  Erick Colace ACNP-BC [place] Pager # [phone] OR # [phone] if no answer  Clementeen Graham 09/02/2018

## 2018-09-02 NOTE — Progress Notes (Deleted)
Per sats

## 2018-09-02 NOTE — Progress Notes (Addendum)
NAME:  Clayton [name], MRN:  [id number], DOB:  [birth year], LOS: 9 ADMISSION DATE:  08/24/2018, CONSULTATION DATE:  08/28/2018 REFERRING MD:  Dr. Leonel Ramsay, CHIEF COMPLAINT:  Stroke  Brief History   41 yoM originally admitted to [facility] on 12/9 with AECOPD, AKI, dehydration s/p N/V for several days found with acute left sided deficits.  Code stroke initiated.  Found to have acute Right M2 occlusion.  TPA not given.  Transferred emergently to [facility] and taken to Neuro IR.  PCCM consulted for vent management.  History of present illness   HPI obtained from medical chart review as patient is currently intubated and sedated on mechanical ventilation.  67 year old male with history of tobacco abuse, COPD, chronic hypoxic respiratory failure on home O2 2Ls, HTN, HLD, DM, CAD w/prior MI, GERD, and prostate CA initially admitted to [facility] on 12/9 after being found on floor.  Patient complained of progressive SOB x 2 weeks, 1 week history of nausea, vomiting, and diarrhea with poor PO intake, dizziness, and generalized weakness.    Admitted for AECOPD, hypoxia, dehydration, AKI, elevated LFTs, and troponin.  Initial CXR and head CT negative.  Evaluated by Cardiology and empirically treated with heparin until 12/10 after TTE did not show any wall motion abnormality and elevation in troponin thought due to demand ischemia.  He has been treated with steroids, nebs, azithro and HFNC weaned down to 5L [place] as of yesterday. He has been afebrile and normotensive. Negative GI workup thus far (-stool, -RUQ [place], -hepatitis panel).  AKI resolved after IV hydration and transaminitis is improving.   LSW around shift change 1900 on 12/12.  After 2030, found to have left facial droop, aphasia, and left sided weakness.  Code stroke activated and assessed by tele Neuro.  NIHSS 16. CT head negative.  Decision made not to give TPA due to elevated PT/INR 15.9/ 1.28.  CTA head/ neck noted for acute right MCA occlusion.    Patient was emergently transferred to [facility] and taken to neuro IR for embolectomy.  Intubated for procedure and will return to ICU on ventilator.  Extubation deferred given ongoing AECOPD.  Therefore, PCCM consulted for further medical and ventilator management in ICU.  Past Medical History   tobacco abuse, COPD, chronic hypoxic respiratory failure on home O2 2Ls, HTN, HLD, DM, CAD w/prior MI, GERD, and prostate [facility] Events   12/9 Admit at [facility] 12/13 tx to [facility]  Consults:  1212 neurology Procedures:  12/13 cerebral angiogram  12/13 ETT >>  Significant Diagnostic Tests:  12/9 [facility]  >> neg 12/10 TTE >> EF 50-55%, G1DD, mild AS, mod TR, PAP 57 mmHg 12/12 CTH >> neg; ASPECTS 10  12/12 CTA head/neck >> 1. Right M2 inferior division origin occlusion and nonocclusive thrombus of distal right M1. Right M2 thrombus approximately 13 mm in length. Good downstream collateralization. 2. Negative CT brain perfusion for acute stroke by perfusion criteria. Mild asymmetry of transit time, increased in the right posterior cerebral hemisphere, likely related to M2 thrombus. 3. Patent carotid and vertebral arteries of the neck. No dissection, aneurysm, or significant stenosis by NASCET criteria. 4. No additional intracranial large vessel occlusion, aneurysm, or vascular malformation. 5. Mild right vertebral artery origin stenosis with calcified plaque. 6. Mild right proximal ICA stenosis with calcified plaque. 7. Mild right and moderate left cavernous and paraclinoid ICA stenosis with calcified plaque.  12/13 CTH >> 1. Hypoattenuation within the right lentiform nucleus spanning up to approximately  3.4 cm (5 cc) likely representing an acute or subacute infarction. No associated hemorrhage or mass effect. Increased conspicuity from prior CT of head. 2. ASPECTS is 9  12/13 CT cerebral perfusion >>  1. Right lentiform nucleus infarction, ASPECTS 9 on noncontrast CT of head. Pseudo  normalized perfusion associated with this area visible infarction. 2. No additional region of acute infarction by perfusion criteria identified. 3. Mildly increased transit time in the right posterior cerebral hemisphere likely related collateralization of right M2 inferior division occlusion.  Micro Data:  12/10 MRSA PCR  >> neg 12/10 Cdiff  >> neg 12/10 GI panel >> neg  Antimicrobials:  12/9 azithro >> off 12/13 ancef pre op off  Interim history/subjective:  Currently on 5 of PEEP 40% FiO2 he is heavily sedated to protect him off propofol and use Precedex.  Objective   Blood pressure (!) 136/95, pulse 71, temperature 98.7 F (37.1 C), temperature source Oral, resp. rate (!) 26, height 6' (1.829 m), weight 94.9 kg, SpO2 91 %.    Vent Mode: PSV;CPAP FiO2 (%):  [40 %] 40 % Set Rate:  [16 bmp] 16 bmp Vt Set:  [620 mL] 620 mL PEEP:  [5 cmH20] 5 cmH20 Pressure Support:  [10 cmH20] 10 cmH20 Plateau Pressure:  [15 cmH20-18 cmH20] 16 cmH20   Intake/Output Summary (Last 24 hours) at 09/02/2018 1212 Last data filed at 09/02/2018 0933 Gross per 24 hour  Intake 1803.01 ml  Output 2470 ml  Net -666.99 ml   Filed Weights   08/30/18 0458 09/01/18 0500 09/02/18 0452  Weight: 96.6 kg 94.5 kg 94.9 kg   Examination: General: Obese male sedated on ventilatory support HEENT: Tracheal tube to ventilator.  Gastric tube with tube feedings Neuro: Currently heavily sedated and does not follow commands CV: Heart sounds are distant PULM: Priest breath sounds in the bases KW:IOXB, non-tender, bsx4 active  Extremities: warm/dry, 1+ edema  Skin: no rashes or lesions   CTA negative for PE  Resolved Hospital Problem list    Assessment & Plan:  Right MCA stroke s/p EVR and complete revascularization, was extubated in the PACU post op but mental status was poor, unable to protect his airway and was reintubated.  Acute right MCA embolic stroke, L sided weakness , per nursing improving Plan:    Per neuro Minimize sedation  Acute respiratory failure with hypoxemia: COPD PAH + 4 L CT with no PE Remains on high PEEP and FIO2 CXR 12/17>> Bibasilar atelectasis or pneumonia. Small left pleural effusion. Top-normal cardiac size without pulmonary vascular congestion. Plan We will transition off propofol Use Precedex Continue to wean as tolerated  ID T Max 100.9 WBC WNL Plan Monitor fever curve  COPD exacerbation: Plan Wean Solu-Medrol to off Wean FiO2 as tolerated  Hyperglycemia Plan Lantus plus sliding scale insulin  History CAD and PTCI Plan Aspirin Telemetry monitoring  HTN: No acute issues Plan  Continue antihypertensives   Hyperkalemia: resolving Monitor potassium Electrolytes  Nutrition Plan Tube feedings   Labs   CBC: Recent Labs  Lab 08/28/18 1002 08/29/18 0402 08/30/18 0519 08/31/18 0331 09/01/18 0322 09/02/18 0329  WBC 5.9 6.4 8.3 9.2 9.3 10.7*  NEUTROABS 5.0  --   --   --   --   --   HGB 11.3* 11.7* 11.9* 12.0* 11.8* 12.5*  HCT 38.9* 39.0 38.4* 37.6* 36.7* 38.5*  MCV 95.3 94.2 92.8 90.4 90.8 92.3  PLT 190 180 177 160 159 353    Basic Metabolic Panel: Recent Labs  Lab 08/29/18 0402 08/29/18 1638 08/30/18 0519 08/30/18 2055 08/31/18 0331 09/01/18 0322 09/02/18 0329  NA 137  --  137  --  139 138 135  K 5.1  --  5.1  --  4.9 4.6 5.3*  CL 103  --  103  --  103 100 99  CO2 27  --  25  --  27 28 28   GLUCOSE 236*  --  241*  --  176* 174* 155*  BUN 30*  --  33*  --  33* 34* 31*  CREATININE 0.72  --  0.76  --  0.72 0.69 0.76  CALCIUM 9.0  --  8.9  --  9.1 9.0 9.0  MG 1.7 1.5* 1.6* 1.6*  --  1.7 1.8  PHOS 3.5 2.9 3.0 3.0  --  3.2 3.4   GFR: Estimated Creatinine Clearance: 108.6 mL/min (by C-G formula based on SCr of 0.76 mg/dL). Recent Labs  Lab 08/30/18 0519 08/31/18 0331 09/01/18 0322 09/02/18 0329  WBC 8.3 9.2 9.3 10.7*    Liver Function Tests: Recent Labs  Lab 08/27/18 0403  AST 190*  ALT 375*  ALKPHOS  105  BILITOT 1.8*  PROT 7.2  ALBUMIN 3.4*   No results for input(s): LIPASE, AMYLASE in the last 168 hours. No results for input(s): AMMONIA in the last 168 hours.  ABG    Component Value Date/Time   PHART 7.468 (H) 09/02/2018 0435   PCO2ART 41.3 09/02/2018 0435   PO2ART 60.0 (L) 09/02/2018 0435   HCO3 29.5 (H) 09/02/2018 0435   TCO2 31 09/01/2018 0328   ACIDBASEDEF 3.0 (H) 08/28/2018 0559   O2SAT 90.2 09/02/2018 0435     Coagulation Profile: Recent Labs  Lab 08/27/18 2111  INR 1.28    Cardiac Enzymes: No results for input(s): CKTOTAL, CKMB, CKMBINDEX, TROPONINI in the last 168 hours.  HbA1C: Hgb A1c MFr Bld  Date/Time Value Ref Range Status  08/28/2018 10:02 AM 5.9 (H) 4.8 - 5.6 % Final    Comment:    (NOTE) Pre diabetes:          5.7%-6.4% Diabetes:              >6.4% Glycemic control for   <7.0% adults with diabetes   05/13/2017 05:10 PM 6.4 (H) 4.8 - 5.6 % Final    Comment:    (NOTE) Pre diabetes:          5.7%-6.4% Diabetes:              >6.4% Glycemic control for   <7.0% adults with diabetes     CBG: Recent Labs  Lab 09/01/18 2003 09/02/18 0039 09/02/18 0431 09/02/18 0749 09/02/18 1131  GLUCAP 165* 157* 140* 117* 200*   cct 30 min  Richardson Landry Minor ACNP Maryanna Shape PCCM Pager [phone] till 1 pm If no answer page [phone] 09/02/2018, 12:12 PM  Attending Note:  67 year old status post CVA who presents to PCCM with respiratory failure due to inability to protect his airway post clot retrieval and stent placement.  On exam, he is able to wean but has a very weak gag.  I reviewed CXR myself, ETT is in a good position with atelectasis noted.  Discussed with PCCM-NP.  Will continue weaning efforts.  Will need to discuss with family plan of care if we are to extubate.  PCCM will continue to follow.  The patient is critically ill with multiple organ systems failure and requires high complexity decision making for assessment and  support, frequent  evaluation and titration of therapies, application of advanced monitoring technologies and extensive interpretation of multiple databases.   Critical Care Time devoted to patient care services described in this note is  32  Minutes. This time reflects time of care of this signee Dr [name]. This critical care time does not reflect procedure time, or teaching time or supervisory time of PA/NP/Med student/Med Resident etc but could involve care discussion time.  Rush Farmer, M.D. [facility] Pulmonary/Critical Care Medicine. Pager: [phone]. After hours pager: [phone].

## 2018-09-02 NOTE — Progress Notes (Signed)
Nutrition Follow-up  DOCUMENTATION CODES:   Not applicable  INTERVENTION:   Tube Feeding:  Increase Vital AF 1.2 to 65 ml/hr Change Pro-Stat to 30 mL daily Provides 1972 kcals, 132 g of protein and 1264 mL of free water Meets 100% estimated calorie and protein needs   NUTRITION DIAGNOSIS:   Increased nutrient needs related to chronic illness(COPD) as evidenced by estimated needs.  Being addressed via TF   GOAL:   Patient will meet greater than or equal to 90% of their needs  Progressing  MONITOR:   TF tolerance, Vent status, I & O's  REASON FOR ASSESSMENT:   Consult, Ventilator Enteral/tube feeding initiation and management  ASSESSMENT:   Pt with PMH of COPD on home O2, CAD, DM,  admitted 12/9 with SOB after several days of N/V. On 12/12 pt with status change and CTA showed R MCA s/p complete revascularization by IR 12/13.   Patient is currently intubated on ventilator support MV: 10 L/min Temp (24hrs), Avg:98.1 F (36.7 C), Min:97.7 F (36.5 C), Max:98.7 F (37.1 C)  Propofol: OFF  Vital AF 1.2 @ 50 ml/hr, Pro-Stat 30 mL BID  Weight up since admission; current wt 94.9 kg. Weight of 92.8 kg on 12/10. Net +3-4 L since admission per flow sheet  Labs: potassium 5.3, Creatinine wdl Meds: solumedrol, ss novolog, lantus   Diet Order:   Diet Order            Diet NPO time specified  Diet effective midnight        Diet NPO time specified  Diet effective now              EDUCATION NEEDS:   No education needs have been identified at this time  Skin:  Skin Assessment: (Venous stasis ulcer RLE)  Last BM:  12/13  Height:   Ht Readings from Last 1 Encounters:  08/25/18 6' (1.829 m)    Weight:   Wt Readings from Last 1 Encounters:  09/02/18 94.9 kg    Ideal Body Weight:  80.9 kg  BMI:  Body mass index is 28.37 kg/m.  Estimated Nutritional Needs:   Kcal:  1970 kcals   Protein:  115-130 grams  Fluid:  >1.9 L/day   Kerman Passey MS,  RD, LDN, CNSC [phone] Pager  [phone] Weekend/On-Call Pager

## 2018-09-02 NOTE — Progress Notes (Signed)
SLP Cancellation Note  Patient Details Name: Jihaad Bruschi MRN: [id number] DOB: 1951-07-03   Cancelled treatment:       Reason Eval/Treat Not Completed: Patient not medically ready - remains on vent, although per RN, may be appropriate for extubation soon. Will follow.   Germain Osgood 09/02/2018, 11:36 AM  Germain Osgood, M.A. [place] Acute Environmental education officer [phone] Office [phone]

## 2018-09-02 NOTE — NC FL2 (Signed)
[place] LEVEL OF CARE SCREENING TOOL     IDENTIFICATION  Patient Name: Clayton [name] Birthdate: 01-07-1951 Sex: male Admission Date (Current Location): 08/24/2018  [facility] and [place] Number:  [employer] and Address:  [place] [address] [place]      Provider Number: [id number]  Attending Physician Name and Address:  Rosalin Hawking, MD  Relative Name and Phone Number:  Izayiah Tibbitts (brother) [phone]    Current Level of Care: Hospital Recommended Level of Care: [place] Prior Approval Number:    Date Approved/Denied:   PASRR Number: [id number]  Discharge Plan: SNF    Current Diagnoses: Patient Active Problem List   Diagnosis Date Noted  . Stroke (cerebrum) ([place]) 08/28/2018  . Middle cerebral artery embolism, right 08/28/2018  . Elevated LFTs 08/24/2018  . AKI (acute kidney injury) ([place]) 08/24/2018  . Acute gastroenteritis 08/24/2018  . HTN (hypertension) 08/24/2018  . Dehydration 08/24/2018  . Guaiac positive stools 07/03/2018  . Acute on chronic respiratory failure with hypoxia ([place]) 05/13/2017  . Elevated troponin 05/13/2017  . COPD exacerbation ([place]) 06/28/2015  . DM type 2 (diabetes mellitus, type 2) ([place]) 06/28/2015  . Erectile dysfunction associated with type 2 diabetes mellitus ([place]) 07/12/2014  . CAD S/P percutaneous coronary angioplasty 04/11/2014  . Prostate cancer ([place]) 04/27/2012  . Abnormal ECG 01/06/2012  . TOBACCO ABUSE 11/23/2010  . GERD 12/12/2009  . DIVERTICULOSIS, COLON 12/12/2009  . COLONIC POLYPS, BENIGN, HX OF 12/12/2009  . ANEMIA 12/04/2009  . HYPERLIPIDEMIA 09/15/2008  . MYOCARDIAL INFARCTION, HX OF 09/15/2008  . PERIPHERAL VASCULAR DISEASE 09/15/2008  . UNSPECIFIED IRON DEFICIENCY ANEMIA 02/15/2008  . BLOOD IN STOOL, OCCULT 02/15/2008    Orientation RESPIRATION BLADDER Height & Weight     Self, Time, Situation, Place  (see dc summary) Continent Weight:  209 lb 3.5 oz (94.9 kg) Height:  6' (182.9 cm)  BEHAVIORAL SYMPTOMS/MOOD NEUROLOGICAL BOWEL NUTRITION STATUS      Continent Diet(see dc summary)  AMBULATORY STATUS COMMUNICATION OF NEEDS Skin   Extensive Assist Verbally Normal                       Personal Care Assistance Level of Assistance  Bathing, Feeding, Dressing Bathing Assistance: Limited assistance Feeding assistance: Independent Dressing Assistance: Limited assistance     Functional Limitations Info  Sight, Hearing, Speech Sight Info: Adequate Hearing Info: Adequate Speech Info: Adequate    SPECIAL CARE FACTORS FREQUENCY  PT (By licensed PT)     PT Frequency: 5 times week              Contractures Contractures Info: Not present    Additional Factors Info  Code Status, Allergies Code Status Info: full Allergies Info: NKA           Current Medications (09/02/2018):  This is the current hospital active medication list Current Facility-Administered Medications  Medication Dose Route Frequency Provider Last Rate Last Dose  . 0.9 %  sodium chloride infusion   Intravenous Continuous Rosalin Hawking, MD 30 mL/hr at 09/02/18 1300    . acetaminophen (TYLENOL) tablet 650 mg  650 mg Oral Q4H PRN Deveshwar, Willaim Rayas, MD       Or  . acetaminophen (TYLENOL) solution 650 mg  650 mg Per Tube Q4H PRN Luanne Bras, MD   650 mg at 09/02/18 0751   Or  . acetaminophen (TYLENOL) suppository 650 mg  650 mg Rectal Q4H PRN Luanne Bras, MD      .  albuterol (PROVENTIL) (2.5 MG/3ML) 0.083% nebulizer solution 2.5 mg  2.5 mg Nebulization Q4H PRN Oswald Hillock, MD      . aspirin chewable tablet 81 mg  81 mg Per Tube Daily Rush Farmer, MD   81 mg at 09/02/18 0804  . bisacodyl (DULCOLAX) suppository 10 mg  10 mg Rectal Daily PRN Magdalen Spatz, NP      . carvedilol (COREG) tablet 6.25 mg  6.25 mg Per Tube BID WC Rigoberto Noel, MD   6.25 mg at 09/02/18 0751  . chlorhexidine gluconate (MEDLINE KIT) (PERIDEX) 0.12 %  solution 15 mL  15 mL Mouth Rinse BID Garvin Fila, MD   15 mL at 09/02/18 0755  . dexmedetomidine (PRECEDEX) 400 MCG/100ML (4 mcg/mL) infusion  0.4-1.2 mcg/kg/hr Intravenous Titrated Minor, Grace Bushy, NP      . feeding supplement (PRO-STAT SUGAR FREE 64) liquid 30 mL  30 mL Per Tube BID Garvin Fila, MD   30 mL at 09/02/18 0751  . feeding supplement (VITAL AF 1.2 CAL) liquid 1,000 mL  1,000 mL Per Tube Continuous Garvin Fila, MD 50 mL/hr at 09/02/18 0749 1,000 mL at 09/02/18 0749  . guaiFENesin (ROBITUSSIN) 100 MG/5ML solution 300 mg  15 mL Oral Q6H Rush Farmer, MD   300 mg at 09/02/18 1139  . heparin injection 5,000 Units  5,000 Units Subcutaneous Q8H Oswald Hillock, MD   5,000 Units at 09/02/18 1438  . insulin aspart (novoLOG) injection 0-20 Units  0-20 Units Subcutaneous Q4H Garvin Fila, MD   4 Units at 09/02/18 1139  . insulin glargine (LANTUS) injection 25 Units  25 Units Subcutaneous Loman Brooklyn, MD   25 Units at 09/02/18 0750  . iohexol (OMNIPAQUE) 300 MG/ML solution 54 mL  54 mL Intravenous Once PRN Deveshwar, Sanjeev, MD      . ipratropium-albuterol (DUONEB) 0.5-2.5 (3) MG/3ML nebulizer solution 3 mL  3 mL Nebulization TID Johnson, Clanford L, MD   3 mL at 09/02/18 1414  . MEDLINE mouth rinse  15 mL Mouth Rinse 10 times per day Garvin Fila, MD   15 mL at 09/02/18 1440  . methylPREDNISolone sodium succinate (SOLU-MEDROL) 40 mg/mL injection 20 mg  20 mg Intravenous Q12H Rush Farmer, MD   20 mg at 09/02/18 1436  . midazolam (VERSED) injection 1 mg  1 mg Intravenous Q2H PRN Anders Simmonds, MD   1 mg at 09/02/18 1318  . ondansetron (ZOFRAN) injection 4 mg  4 mg Intravenous Q6H PRN Luanne Bras, MD   4 mg at 09/02/18 0134  . pantoprazole sodium (PROTONIX) 40 mg/20 mL oral suspension 40 mg  40 mg Per Tube Daily Rigoberto Noel, MD   40 mg at 09/02/18 0755  . polyethylene glycol (MIRALAX / GLYCOLAX) packet 17 g  17 g Oral Daily Magdalen Spatz, NP   17 g at  09/02/18 6237     Discharge Medications: Please see discharge summary for a list of discharge medications.  Relevant Imaging Results:  Relevant Lab Results:   Additional Information SSN: [id number]  Malaki Koury A Meko [place], LCSW

## 2018-09-03 ENCOUNTER — Inpatient Hospital Stay (HOSPITAL_COMMUNITY): Payer: Medicare Other

## 2018-09-03 DIAGNOSIS — Z978 Presence of other specified devices: Secondary | ICD-10-CM

## 2018-09-03 LAB — BASIC METABOLIC PANEL
ANION GAP: 9 (ref 5–15)
BUN: 27 mg/dL — ABNORMAL HIGH (ref 8–23)
CO2: 28 mmol/L (ref 22–32)
Calcium: 9 mg/dL (ref 8.9–10.3)
Chloride: 98 mmol/L (ref 98–111)
Creatinine, Ser: 0.62 mg/dL (ref 0.61–1.24)
GFR calc Af Amer: >60 mL/min (ref >60)
GFR calc non Af Amer: >60 mL/min (ref >60)
Glucose, Bld: 80 mg/dL (ref 70–99)
POTASSIUM: 5.3 mmol/L — AB (ref 3.5–5.1)
Sodium: 135 mmol/L (ref 135–145)

## 2018-09-03 LAB — CBC
HCT: 37.6 % — ABNORMAL LOW (ref 39.0–52.0)
Hemoglobin: 12.2 g/dL — ABNORMAL LOW (ref 13.0–17.0)
MCH: 29.7 pg (ref 26.0–34.0)
MCHC: 32.4 g/dL (ref 30.0–36.0)
MCV: 91.5 fL (ref 80.0–100.0)
NRBC: 0 % (ref 0.0–0.2)
Platelets: 161 10*3/uL (ref 150–400)
RBC: 4.11 MIL/uL — ABNORMAL LOW (ref 4.22–5.81)
RDW: 14.8 % (ref 11.5–15.5)
WBC: 6.6 10*3/uL (ref 4.0–10.5)

## 2018-09-03 LAB — TRIGLYCERIDES: Triglycerides: 31 mg/dL (ref <150)

## 2018-09-03 LAB — GLUCOSE, CAPILLARY
GLUCOSE-CAPILLARY: 113 mg/dL — AB (ref 70–99)
GLUCOSE-CAPILLARY: 57 mg/dL — AB (ref 70–99)
GLUCOSE-CAPILLARY: 76 mg/dL (ref 70–99)
Glucose-Capillary: 101 mg/dL — ABNORMAL HIGH (ref 70–99)
Glucose-Capillary: 59 mg/dL — ABNORMAL LOW (ref 70–99)
Glucose-Capillary: 76 mg/dL (ref 70–99)
Glucose-Capillary: 79 mg/dL (ref 70–99)
Glucose-Capillary: 84 mg/dL (ref 70–99)
Glucose-Capillary: 96 mg/dL (ref 70–99)

## 2018-09-03 LAB — MAGNESIUM: Magnesium: 1.7 mg/dL (ref 1.7–2.4)

## 2018-09-03 LAB — PHOSPHORUS: Phosphorus: 4 mg/dL (ref 2.5–4.6)

## 2018-09-03 MED ORDER — DEXTROSE-NACL 5-0.9 % IV SOLN
INTRAVENOUS | Status: DC
  Administered 2018-09-03: 22:00:00 via INTRAVENOUS

## 2018-09-03 MED ORDER — DEXTROSE 50 % IV SOLN
INTRAVENOUS | Status: AC
  Administered 2018-09-03: 25 mL
  Filled 2018-09-03: qty 50

## 2018-09-03 MED ORDER — DEXTROSE 50 % IV SOLN
25.0000 mL | Freq: Once | INTRAVENOUS | Status: AC
  Administered 2018-09-03: 25 mL via INTRAVENOUS
  Filled 2018-09-03: qty 50

## 2018-09-03 MED ORDER — INSULIN GLARGINE 100 UNIT/ML ~~LOC~~ SOLN
20.0000 [IU] | SUBCUTANEOUS | Status: DC
  Filled 2018-09-03 (×2): qty 0.2

## 2018-09-03 MED ORDER — METHYLPREDNISOLONE SODIUM SUCC 40 MG IJ SOLR
20.0000 mg | Freq: Every day | INTRAMUSCULAR | Status: DC
  Filled 2018-09-03: qty 1

## 2018-09-03 MED ORDER — WHITE PETROLATUM EX OINT
TOPICAL_OINTMENT | CUTANEOUS | Status: AC
  Administered 2018-09-03: 0.2
  Filled 2018-09-03: qty 28.35

## 2018-09-03 NOTE — Progress Notes (Signed)
Hypoglycemic Event  CBG: 57  Treatment: D50 25 mL (12.5 gm)  Symptoms: None  Follow-up CBG: Time:2055 CBG Result: 101  Possible Reasons for Event: Inadequate meal intake  Comments/MD notified:ELINK notified. Awaiting new orders    Clint Bolder 09/03/18 9:05 PM

## 2018-09-03 NOTE — Progress Notes (Signed)
Physical medicine rehabilitation consult requested chart reviewed. Patient was vented up through last evening and currently underwent tracheostomy 09/02/2018. Await follow-up therapy evaluations. Plan to complete formal rehabilitation consult as therapy evaluations completed.

## 2018-09-03 NOTE — Progress Notes (Addendum)
NAME:  Clayton [name], MRN:  [id number], DOB:  [birth year], LOS: 10 ADMISSION DATE:  08/24/2018, CONSULTATION DATE:  08/28/2018 REFERRING MD:  Dr. Leonel Ramsay, CHIEF COMPLAINT:  Stroke  Brief History   77 yoM originally admitted to [facility] on 12/9 with AECOPD, AKI, dehydration s/p N/V for several days found with acute left sided deficits.  Code stroke initiated.  Found to have acute Right M2 occlusion.  TPA not given.  Transferred emergently to [facility] and taken to Neuro IR for thrombectomy.  PCCM consulted for vent management.  Past Medical History  Tobacco abuse, COPD, chronic hypoxic respiratory failure on home O2 2Ls, HTN, HLD, DM, CAD w/prior MI, GERD, and prostate [facility] Events   12/9 Admit at [facility] 12/13 tx to [facility] for thrombectomy 09/02/2018- failed extubation required tracheostomy  Consults:  1212 neurology Procedures:  12/13 cerebral angiogram  12/13 ETT >>12/18 09/02/2018 tracheostomy placed>> 09/03/2018 core track ordered>>  Significant Diagnostic Tests:  12/10 TTE >> EF 50-55%, G1DD, mild AS, mod TR, PAP 57 mmHg  CTA chest 08/30/2018-no pulmonary embolism, COPD changes of bilateral effusions, compressive atelectasis, left upper lobe pulmonary nodule.  09/03/2018 portable chest x-ray reviewed by me reveals tracheostomy in good position without acute issues Micro Data:  12/10 MRSA PCR  >> neg 12/10 Cdiff  >> neg 12/10 GI panel >> neg  Antimicrobials:  12/9 azithro >> off 12/13 ancef pre op off  Interim history/subjective:  Currently on ventilatory support via tracheostomy and weaning FiO2  Objective   Blood pressure 115/85, pulse 68, temperature 99.5 F (37.5 C), temperature source Oral, resp. rate 18, height 6' (1.829 m), weight 91 kg, SpO2 94 %.    Vent Mode: PRVC FiO2 (%):  [40 %-100 %] 50 % Set Rate:  [16 bmp] 16 bmp Vt Set:  [620 mL] 620 mL PEEP:  [5 cmH20] 5 cmH20 Pressure Support:  [10 cmH20] 10 cmH20 Plateau Pressure:  [15 cmH20-16  cmH20] 16 cmH20   Intake/Output Summary (Last 24 hours) at 09/03/2018 0926 Last data filed at 09/03/2018 0800 Gross per 24 hour  Intake 736.59 ml  Output 2155 ml  Net -1418.41 ml   Filed Weights   09/01/18 0500 09/02/18 0452 09/03/18 0451  Weight: 94.5 kg 94.9 kg 91 kg   Examination: General: 67 year old male currently with tracheostomy is awake and alert follows commands no acute distress HEENT: Tracheostomy is in place with minimal bleeding Neuro: Hemiparesis is again noted CV: Sounds are distant PULM: even/non-labored, lungs bilaterally diminished FG:HWEX, non-tender, bsx4 active, core track is been ordered Extremities: warm/dry, 1+ edema  Skin: no rashes or lesions  Resolved Hospital Problem list    Assessment & Plan:  Right MCA stroke s/p EVR and complete revascularization, was extubated in the PACU post op but mental status was poor, unable to protect his airway and was reintubated.  He was extubated on 09/02/2018 and again failed extubation and required tracheostomy placed at that time.  Acute right MCA embolic stroke, L sided weakness , per nursing improving Plan:  Per neurology More awake 09/03/2018  Acute respiratory failure with hypoxemia: Failed extubation 09/02/2018, emergency tracheostomy was performed 09/02/2018 COPD [place] from ventilator as tolerated Continue negative I&O as tolerated  ID Temperature currently 99.5 WBC WNL Plan Continue to monitor fever curve  COPD exacerbation: Plan Wean Solu-Medrol to 20 mg IV daily 09/03/2018.  Can stop within 2 to 3 days Bronchodilator  Hyperglycemia Plan Decrease Lantus to 20 units daily Sliding-scale insulin protocol  History CAD and PTCI Plan Aspirin Telemetry monitoring  HTN: No acute issues Plan  We will need to convert antihypertensive to per feeding tube was placed   Hyperkalemia: resolving Monitor potassium   Nutrition Plan He will need a core tract Once core track in  place start tube feedings  cct 30 min  Richardson Landry Minor ACNP Maryanna Shape PCCM Pager [phone] till 1 pm If no answer page [phone] 09/03/2018, 9:26 AM  Attending note: I have seen and examined the patient. History, labs and imaging reviewed.  No acute events overnight.  Remains on the trach, vent supported.  Blood pressure 119/69, pulse 70, temperature 99.5 F (37.5 C), temperature source Oral, resp. rate 17, height 6' (1.829 m), weight 91 kg, SpO2 93 %. Gen:      No acute distress HEENT:  EOMI, sclera anicteric Neck:     No masses; no thyromegaly trach in place Lungs:    Clear to auscultation bilaterally; normal respiratory effort CV:         Regular rate and rhythm; no murmurs Abd:      + bowel sounds; soft, non-tender; no palpable masses, no distension Ext:    No edema; adequate peripheral perfusion Skin:      Warm and dry; no rash Neuro: Awake, interactive  Labs reviewed, significant for Potassium 5.3, BUN/creatinine 27/0.62, WBC 6.6, hemoglobin 12.0, platelet 161  Imaging Chest x-ray 09/03/2018- gastrum in place, vascular congestion, low lung volumes with bibasilar lung bases. I have reviewed the images personally.  Assessment/plan: 67 year old with stroke status post revascularization.  Failed extubation due to poor mental status  Acute respiratory failure Tracheostomy History of COPD Wean from ventilator as tolerated Wean Solu-Medrol.  Start taper. Continue bronchodilators  GI Place [place], start nutrition  The patient is critically ill with multiple organ systems failure and requires high complexity decision making for assessment and support, frequent evaluation and titration of therapies, application of advanced monitoring technologies and extensive interpretation of multiple databases.  Critical care time - 35 mins. This represents my time independent of the NPs time taking care of the pt.  Marshell Garfinkel MD [facility] 09/03/2018,  11:41 AM

## 2018-09-03 NOTE — Progress Notes (Signed)
SLP Cancellation Note  Patient Details Name: Clayton [name] MRN: [id number] DOB: 07/15/1951   Cancelled treatment:       Reason Eval/Treat Not Completed: Other (comment). Pt has potential for PMSV inline while on vent. Briefly discussed with MD. Will f/u tomorrow.   Herbie [place], MA CCC-SLP  Acute Rehabilitation Services Pager [phone] Office [phone]  Lynann Beaver 09/03/2018, 11:34 AM

## 2018-09-03 NOTE — Progress Notes (Signed)
Physical Therapy Treatment Patient Details Name: Clayton [name] MRN: [id number] DOB: [birth year] Today's Date: 09/03/2018    History of Present Illness 67 y.o. male admitted to [facility] on 08/24/18 for SOB.  Pt dx with acute on chronic respiratory failure, acute kidney injury, acute gastroenteritis, elevated troponin (thought to be demand ischemia), lactic acidosis, HTN, elevated liver enzymes.  On 08/27/18 pt had sudden onset L facial droop and arm weakness.  CTA revealed R M2 occlusion. Pt transferred emergently to [facility] and IR preformed R common carotid arteriorgram followed by complete revascularization of R MCA M1 on 08/28/18.  Pt failed extubation post operatively and has been ventilated via ETT since revascularization.  Pt with other significant PMH of PVD, prostate CA, HTN, DM, COPD, CAD, cardiac stent placement, and bil cataract surgery. Trach performed 12/18.     PT Comments    Session focusing on bed level therex, bed mobility and sitting balance. Patient max A today to bring to sitting. Posterior leaning noted but able to correct and control for short bouts during visit. Will cont to follow and progress as tolerated, agree with CIR recs at this time.     Vent FiO2 40%, PEEP 5, RR 16  HR 85 SpO2 85-93%     Follow Up Recommendations  CIR     Equipment Recommendations  Rolling walker with 5" wheels;3in1 (PT);Wheelchair (measurements PT);Wheelchair cushion (measurements PT)    Recommendations for Other Services Rehab consult     Precautions / Restrictions Precautions Precautions: Fall Precaution Comments: left sided weakness Restrictions Weight Bearing Restrictions: No    Mobility  Bed Mobility Overal bed mobility: Needs Assistance Bed Mobility: Supine to Sit;Sit to Supine     Supine to sit: HOB elevated;Max assist Sit to supine: HOB elevated;Max assist   General bed mobility comments: Max assist to support trunk to get to EOB and assist in progressing bil legs to  EOB.  Assist needed to support trunk and lift both legs to return to supine. VSS on vent, patient with posterior leaning during sitting, able to correct and control for short bouts   Transfers                    Ambulation/Gait                 Stairs             Wheelchair Mobility    Modified Rankin (Stroke Patients Only)       Balance Overall balance assessment: Needs assistance Sitting-balance support: Feet supported;Bilateral upper extremity supported Sitting balance-Leahy Scale: Poor Sitting balance - Comments: mod A for support, posterior lean Postural control: Posterior lean                                  Cognition Arousal/Alertness: Awake/alert   Overall Cognitive Status: Difficult to assess                                        Exercises General Exercises - Lower Extremity Ankle Circles/Pumps: 10 reps Quad Sets: 10 reps Long Arc Quad: 10 reps Heel Slides: 10 reps Hip ABduction/ADduction: 10 reps    General Comments        Pertinent Vitals/Pain Pain Assessment: No/denies pain Faces Pain Scale: No hurt    Home Living  Prior Function            PT Goals (current goals can now be found in the care plan section) Acute Rehab PT Goals Patient Stated Goal: pt has ETT unable to state PT Goal Formulation: Patient unable to participate in goal setting Time For Goal Achievement: 09/15/18 Potential to Achieve Goals: Good Progress towards PT goals: Progressing toward goals    Frequency    Min 4X/week      PT Plan Current plan remains appropriate    Co-evaluation              AM-PAC PT "6 Clicks" Mobility   Outcome Measure  Help needed turning from your back to your side while in a flat bed without using bedrails?: A Lot Help needed moving from lying on your back to sitting on the side of a flat bed without using bedrails?: A Lot Help needed moving to and  from a bed to a chair (including a wheelchair)?: A Lot Help needed standing up from a chair using your arms (e.g., wheelchair or bedside chair)?: Total Help needed to walk in hospital room?: Total Help needed climbing 3-5 steps with a railing? : Total 6 Click Score: 9    End of Session Equipment Utilized During Treatment: Oxygen(vent) Activity Tolerance: Patient tolerated treatment well Patient left: in bed;with call bell/phone within reach;with nursing/sitter in room Nurse Communication: Mobility status PT Visit Diagnosis: Muscle weakness (generalized) (M62.81);Difficulty in walking, not elsewhere classified (R26.2);Hemiplegia and hemiparesis Hemiplegia - Right/Left: Left Hemiplegia - dominant/non-dominant: Non-dominant Hemiplegia - caused by: Cerebral infarction     Time: 3818-2993 PT Time Calculation (min) (ACUTE ONLY): 24 min  Charges:  $Therapeutic Activity: 23-37 mins                     Reinaldo Berber, PT, DPT Acute Rehabilitation Services Pager: [phone] Office: [phone]     Reinaldo Berber 09/03/2018, 4:57 PM

## 2018-09-03 NOTE — Progress Notes (Signed)
STROKE TEAM PROGRESS NOTE   SUBJECTIVE (INTERVAL HISTORY) Patient sister and RN are at bedside. The patient was extubated yesterday, however shortly, he was not able to tolerating extubation and was intubated and had tracheostomy done.  Today he was seen lying in bed, had trach, still on vent.  However, neurologically stable, awake alert, followed commands, able to mouth words.  Still has mild left-sided weakness but improving.  Currently on tube feeding, plan for PEG tube next week and discharged to [facility].   OBJECTIVE Vitals:   09/03/18 1400 09/03/18 1500 09/03/18 1527 09/03/18 1605  BP: 113/72 112/74 112/74   Pulse: 74 77 77   Resp: 18 (!) 21 20   Temp:    99 F (37.2 C)  TempSrc:    Oral  SpO2: 92% 92% 93%   Weight:      Height:        CBC:  Recent Labs  Lab 08/28/18 1002  09/02/18 0329 09/03/18 0545  WBC 5.9   < > 10.7* 6.6  NEUTROABS 5.0  --   --   --   HGB 11.3*   < > 12.5* 12.2*  HCT 38.9*   < > 38.5* 37.6*  MCV 95.3   < > 92.3 91.5  PLT 190   < > 150 161   < > = values in this interval not displayed.    Basic Metabolic Panel:  Recent Labs  Lab 09/02/18 0329 09/03/18 0545  NA 135 135  K 5.3* 5.3*  CL 99 98  CO2 28 28  GLUCOSE 155* 80  BUN 31* 27*  CREATININE 0.76 0.62  CALCIUM 9.0 9.0  MG 1.8 1.7  PHOS 3.4 4.0    Lipid Panel:     Component Value Date/Time   CHOL 112 08/28/2018 1002   TRIG 31 09/03/2018 0545   HDL 32 (L) 08/28/2018 1002   CHOLHDL 3.5 08/28/2018 1002   VLDL 15 08/28/2018 1002   LDLCALC 65 08/28/2018 1002   HgbA1c:  Lab Results  Component Value Date   HGBA1C 5.9 (H) 08/28/2018   Urine Drug Screen:     Component Value Date/Time   LABOPIA NONE DETECTED 08/28/2018 1816   COCAINSCRNUR NONE DETECTED 08/28/2018 1816   LABBENZ NONE DETECTED 08/28/2018 1816   AMPHETMU NONE DETECTED 08/28/2018 1816   THCU NONE DETECTED 08/28/2018 1816   LABBARB NONE DETECTED 08/28/2018 1816    Alcohol Level No results found for:  ETH  IMAGING  Ct Angio Head W Or Wo Contrast Ct Angio Neck W Or Wo Contrast Ct Cerebral Perfusion W Contrast 08/28/2018 IMPRESSION:  1. Right M2 inferior division origin occlusion and nonocclusive thrombus of distal right M1. Right M2 thrombus approximately 13 mm in length. Good downstream collateralization.  2. Negative CT brain perfusion for acute stroke by perfusion criteria. Mild asymmetry of transit time, increased in the right posterior cerebral hemisphere, likely related to M2 thrombus.  3. Patent carotid and vertebral arteries of the neck. No dissection, aneurysm, or significant stenosis by NASCET criteria.  4. No additional intracranial large vessel occlusion, aneurysm, or vascular malformation.  5. Mild right vertebral artery origin stenosis with calcified plaque.  6. Mild right proximal ICA stenosis with calcified plaque.  7. Mild right and moderate left cavernous and paraclinoid ICA stenosis with calcified plaque.    Ct Cerebral Perfusion W Contrast 08/28/2018 IMPRESSION:  1. Right lentiform nucleus infarction, ASPECTS 9 on noncontrast CT of head. Pseudo normalized perfusion associated with this area visible infarction.  2.  No additional region of acute infarction by perfusion criteria identified.  3. Mildly increased transit time in the right posterior cerebral hemisphere likely related collateralization of right M2 inferior division occlusion.  Ct Head Code Stroke Wo Contrast 08/28/2018 IMPRESSION:  1. Hypoattenuation within the right lentiform nucleus spanning up to approximately 3.4 cm (5 cc) likely representing an acute or subacute infarction. No associated hemorrhage or mass effect. Increased conspicuity from prior CT of head.  2. ASPECTS is 9    Ct Head Code Stroke Wo Contrast` 08/27/2018 IMPRESSION:  1. No acute hemorrhage.  2. ASPECTS is 10.    Cerebral Angiogram S/P RT common carotid arteriogram followed by complete revascularization of occluded distal RT  MCA M1 and co dominant inf division withx 1 pass with 78mm x 30mm embotrap retriever device achieving a TICI 3 revascularization  Transthoracic Echocardiogram  08/25/2018 Study Conclusions - Left ventricle: The cavity size was normal. There was mild   concentric hypertrophy. Systolic function was normal. The   estimated ejection fraction was in the range of 50% to 55%. There   was septal-lateral dyssynchrony. Doppler parameters are   consistent with abnormal left ventricular relaxation (grade 1   diastolic dysfunction). - Ventricular septum: Septal motion showed abnormal function and   dyssynergy. These changes are consistent with intraventricular   conduction delay. - Aortic valve: Trileaflet; mildly thickened, moderately calcified   leaflets. Morphologically, there appears to be mild calcific   aortic stenosis. - Mitral valve: Mildly calcified annulus. - Right ventricle: The cavity size was mildly dilated. Systolic   function was moderately reduced. - Right atrium: The atrium was mildly dilated. - Tricuspid valve: There was moderate regurgitation. - Pulmonary arteries: PA peak pressure: 57 mm Hg (S). - Inferior vena cava: The vessel was dilated. The respirophasic   diameter changes were blunted (< 50%), consistent with elevated   central venous pressure. Estimated CVP 15 mmHg.   Ct Head Wo Contrast 08/29/2018 IMPRESSION:  1. Evolving acute RIGHT basal ganglia nonhemorrhagic infarct with petechial hemorrhage. Additional small acute infarcts better demonstrated on prior MRI.  2. Acute pontine lacunar infarct versus artifact.   3. Moderate to severe chronic small vessel ischemic changes, advanced for age.  4. Moderate parenchymal brain volume loss, advanced for age.    Mr Brain Wo Contrast 08/28/2018 IMPRESSION:  Areas of acute infarction within the right middle cerebral artery territory as outlined above. Largest region affects the corpus striatum of the basal ganglia on the  right. Petechial blood products present in that location. Scattered other few small cortical infarctions within the temporal and parietal lobes. Minimal petechial blood products associated with the small posterior parietal cortical infarctions.    LE venous doppler no DVT    PHYSICAL EXAM Blood pressure 112/74, pulse 77, temperature 99 F (37.2 C), temperature source Oral, resp. rate 20, height 6' (1.829 m), weight 91 kg, SpO2 93 %.   Elderly middle-aged African-American male who has trach and on ventilation. Afebrile. Head is nontraumatic. Neck is supple without bruit.    Cardiac exam no murmur or gallop. Lungs are clear to auscultation. Distal pulses are well felt.  Neurological Exam Patient has trach and on sedation. He is awake and alert and following all simple commands. Eyes open.  Pupil bilateral equal size, EOMI, blinking to visual threat bilaterally.  Tracking objects in all fields.  Fundi were not visualized.  Facial symmetric. Tongue midline.  Right upper extremity 4+/5, 4/5 LUE proximal but 3/5 distal.  BLE proximal 0/5 proximal  but knee flexion 3/5 on the right and 2/5 on the left, bilateral foot DF 3/5 on the right , minimal movement on the left DF. DTR 1+ bilaterally. Babinski negative bilateral. Sensation symmetric, coordination not corporative and gait not tested.  ASSESSMENT/PLAN [name] is a 67 y.o. male with history of coronary artery disease previous MI / stenting, COPD, diabetes mellitus, gastroesophageal reflux disease, hypertension, hyperlipidemia, prostate cancer, and peripheral vascular disease presenting with left-sided weakness. He did not receive IV t-PA due to elevated protime.  Stroke: Rt MCA, MCA/ACA, MCA/PCA infarcts - embolic - source unknown, large vessel atherosclerosis versus cardioembolic source  Resultant  Left hemiparesis  CT head - Hypoattenuation within the right lentiform nucleus likely representing an acute or subacute infarction.  CTA  H&N - Right M2 inferior division origin occlusion and nonocclusive thrombus of distal right M1. Right M2 thrombus approximately 13 mm in length.   DSA right occluded M1 and M2 status post IR with TICI3 reperfusion  MRI head - right MCA, MCA/ACA, MCA/PCA infarcts with right BG petechial hemorrhagic transformation  CT repeat right MCA infarct with petechial hemorrhage at right BG  LE venous Doppler no DVT  2D Echo  - EF 50 - 55%. No cardiac source of emboli identified.   Consider TEE/loop recorder to evaluate cardioembolic source in the future based on potential functional improvement  LDL - 65  HgbA1c - 5.9  UDS - negative  VTE prophylaxis - Alpha Heparin  Diet - NPO on tube feeding  No antithrombotic prior to admission, now on aspirin 81 mg daily.  No DAPT at this time given potential PEG tube placement.  Patient will be counseled to be compliant with his antithrombotic medications  Ongoing aggressive stroke risk factor management  Therapy recommendations: LTAC  Disposition:  Pending  Respiratory failure  Not able to tolerate extubation  Had a tracheostomy  Still on ventilation  CCM on board  CTA chest no PE  Still on steroids  Planning for LTAC  Hypertension  Stable . BP goal < 160 given hemorrhagic conversion . Labetalol PRN . Long-term BP goal normotensive  Diabetes  HgbA1c 5.9, goal < 7.0  Controlled  Hyperglycemia much improved  On Lantus 25U  SSI  CBG monitoring  Dysphagia  Had a trach on ventilation  Speech on board  Currently on tube feeding  Plan for PEG next week before LTAC placement  Tobacco abuse  Current smoker  Smoking cessation counseling provided  On nicotine patch  Pt is willing to quit  Other Stroke Risk Factors  Advanced age  Coronary artery disease/MI status post stent 2009  PVD  Other Active Problems  Hyperkalemia - 5.5 -> Kayexalate per CCM-> 5.1->5.1->4.9-4.6--5.3-5.3  Prostate cancer  Acute  gastroenteritis, resolved  AKI, resolved  Leukocytosis WBC 9.3-10.7-6.6  Hospital day # 10  This patient is critically ill and at significant risk of neurological worsening, death and care requires constant monitoring of vital signs, hemodynamics,respiratory and cardiac monitoring, extensive review of multiple databases, frequent neurological assessment, discussion with family, other specialists and medical decision making of high complexity. I spent 35 minutes of neurocritical care time  in the care of  this patient.   Rosalin Hawking, MD PhD Stroke Neurology 09/03/2018 5:33 PM    To contact Stroke Continuity provider, please refer to [email or web address] After hours, contact General Neurology

## 2018-09-03 NOTE — Care Management Note (Signed)
Case Management Note Manya Silvas, RN MSN CCM Transitions of Care  [phone]  Patient Details  Name: Amarri Satterly MRN: [id number] Date of Birth: Mar 26, 1951  Subjective/Objective:        Elevated LFTs -advanced COPD         Action/Plan: PTA home. Transfer from [facility]. 12/18 Extubated. PT/OT recommending CIR. CIR coordinator, Pamala Hurry, notified of extubation in order to complete screening. CSW, Shelton Silvas, notified for potential back up transition. Will continue to follow for transition of care needs.   Expected Discharge Date:                  Expected Discharge Plan:  Long Term Acute Care (LTAC)  In-House Referral:  Clinical Social Work  Discharge planning Services  CM Consult  Post Acute Care Choice:  Long Term Acute Care (LTAC) Choice offered to:  Patient  DME Arranged:    DME Agency:     HH Arranged:    [place] Agency:     Status of Service:  In process, will continue to follow  If discussed at Long Length of Stay Meetings, dates discussed:    Discharge Disposition:   Additional Comments:  09/03/18- 1130- Marvetta Gibbons RN, CM- spoke with [place]- regarding possible LTACH- verbal order given for [facility] referral- CM spoke with pt at bedside- who is trached but A&O- info provided to pt on both LTACH along with CMS list/ratings - per pt indication he has chosen [facility] for transition needs- referral called to [place] with [facility] who states pt is appropriate for LTACH and they can offer bed today- in f/u with PCCM- they would like to monitor pt post trach for 48 hr for stability to transition to [facility]. Per [facility] they will have bed available on 12/20 and will f/u for medical readiness.  CM followed up with pt and girlfriend at the bedside info provided with pt's permission to speak in front of girlfriend regarding [facility] bed offer and waiting on MD clearance for transition to [facility]. CM will continue to follow for transition needs and LTACH transfer.    09/03/2018, 12:04 PM Marvetta Gibbons RN,BSN Transitions of Care Unit 53M - RN Case Manager [phone]

## 2018-09-03 NOTE — Progress Notes (Signed)
[facility] Progress Note Patient Name: Lando Alcalde DOB: 09/30/1950 MRN: [id number]   Date of Service  09/03/2018  HPI/Events of Note  Pt had trache placed and is still NPO.    Pt has been hypoglycemic requiring D50 pushes.  eICU Interventions  Change IVF to D5NS@50ml /hr.     Intervention Category Minor Interventions: Other:  Elsie Lincoln 09/03/2018, 9:53 PM

## 2018-09-04 ENCOUNTER — Inpatient Hospital Stay (HOSPITAL_COMMUNITY): Payer: Medicare Other

## 2018-09-04 LAB — BASIC METABOLIC PANEL
Anion gap: 12 (ref 5–15)
BUN: 24 mg/dL — ABNORMAL HIGH (ref 8–23)
CALCIUM: 8.9 mg/dL (ref 8.9–10.3)
CO2: 26 mmol/L (ref 22–32)
Chloride: 96 mmol/L — ABNORMAL LOW (ref 98–111)
Creatinine, Ser: 0.65 mg/dL (ref 0.61–1.24)
GFR calc Af Amer: >60 mL/min (ref >60)
GFR calc non Af Amer: >60 mL/min (ref >60)
Glucose, Bld: 94 mg/dL (ref 70–99)
Potassium: 4.8 mmol/L (ref 3.5–5.1)
Sodium: 134 mmol/L — ABNORMAL LOW (ref 135–145)

## 2018-09-04 LAB — CBC
HCT: 37.7 % — ABNORMAL LOW (ref 39.0–52.0)
Hemoglobin: 12.6 g/dL — ABNORMAL LOW (ref 13.0–17.0)
MCH: 29.9 pg (ref 26.0–34.0)
MCHC: 33.4 g/dL (ref 30.0–36.0)
MCV: 89.5 fL (ref 80.0–100.0)
PLATELETS: 162 10*3/uL (ref 150–400)
RBC: 4.21 MIL/uL — ABNORMAL LOW (ref 4.22–5.81)
RDW: 14.7 % (ref 11.5–15.5)
WBC: 9.7 10*3/uL (ref 4.0–10.5)
nRBC: 0 % (ref 0.0–0.2)

## 2018-09-04 LAB — GLUCOSE, CAPILLARY
Glucose-Capillary: 100 mg/dL — ABNORMAL HIGH (ref 70–99)
Glucose-Capillary: 130 mg/dL — ABNORMAL HIGH (ref 70–99)
Glucose-Capillary: 155 mg/dL — ABNORMAL HIGH (ref 70–99)
Glucose-Capillary: 171 mg/dL — ABNORMAL HIGH (ref 70–99)
Glucose-Capillary: 190 mg/dL — ABNORMAL HIGH (ref 70–99)
Glucose-Capillary: 88 mg/dL (ref 70–99)

## 2018-09-04 LAB — PHOSPHORUS: Phosphorus: 4.4 mg/dL (ref 2.5–4.6)

## 2018-09-04 LAB — MAGNESIUM: Magnesium: 1.6 mg/dL — ABNORMAL LOW (ref 1.7–2.4)

## 2018-09-04 MED ORDER — DOCUSATE SODIUM 50 MG/5ML PO LIQD
100.0000 mg | Freq: Every day | ORAL | Status: DC
  Administered 2018-09-04 – 2018-09-05 (×2): 100 mg via ORAL
  Filled 2018-09-04 (×2): qty 10

## 2018-09-04 MED ORDER — METHYLPREDNISOLONE SODIUM SUCC 40 MG IJ SOLR
20.0000 mg | Freq: Every day | INTRAMUSCULAR | Status: AC
  Administered 2018-09-04 – 2018-09-05 (×2): 20 mg via INTRAVENOUS
  Filled 2018-09-04: qty 1

## 2018-09-04 MED ORDER — MAGNESIUM SULFATE 2 GM/50ML IV SOLN
2.0000 g | Freq: Once | INTRAVENOUS | Status: AC
  Administered 2018-09-04: 2 g via INTRAVENOUS
  Filled 2018-09-04: qty 50

## 2018-09-04 MED ORDER — IPRATROPIUM-ALBUTEROL 0.5-2.5 (3) MG/3ML IN SOLN: 3.0000 mL | Freq: Four times a day (QID) | RESPIRATORY_TRACT | Status: DC | PRN

## 2018-09-04 NOTE — Discharge Summary (Addendum)
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 CAD (coronary artery disease)    a. s/p BMS x 3 to RCA in 2009  . COPD (chronic obstructive pulmonary disease) ([place])   . Diabetes mellitus   . GERD (gastroesophageal reflux disease)   . Guaiac positive stools 07/03/2018  . HTN (hypertension)   . Hyperlipidemia   . Myocardial infarction ([place]) 2009  . Prostate cancer ([place])   . PVD (peripheral vascular disease) ([place])    Past Surgical History:  Procedure Laterality Date  . CARDIAC CATHETERIZATION     with stent  . cardiac stents  2009  . CATARACT EXTRACTION W/PHACO Left 08/19/2013   Procedure: CATARACT EXTRACTION PHACO AND INTRAOCULAR LENS PLACEMENT (IOC);  Surgeon: Tonny Branch, MD;  Location: [facility];  Service: Ophthalmology;  Laterality: Left;  CDE:37.77  . CATARACT EXTRACTION W/PHACO Right 07/29/2016   Procedure: CATARACT EXTRACTION PHACO AND INTRAOCULAR LENS PLACEMENT RIGHT EYE; CDE:  18.10;  Surgeon: Tonny Branch, MD;  Location: [facility];  Service: Ophthalmology;  Laterality: Right;  . IR CT HEAD LTD  08/28/2018  . IR PERCUTANEOUS ART THROMBECTOMY/INFUSION  INTRACRANIAL INC DIAG ANGIO  08/28/2018  . RADIOLOGY WITH ANESTHESIA N/A 08/28/2018   Procedure: IR WITH ANESTHESIA;  Surgeon: Radiologist, Medication, MD;  Location: [facility];  Service: Radiology;  Laterality: N/A;    Allergies as of 09/05/2018   No Known Allergies     Medication List    STOP taking these medications   albuterol 108 (90 Base) MCG/ACT inhaler Commonly known as:  PROVENTIL HFA;VENTOLIN HFA   amLODipine 5 MG tablet Commonly known as:  NORVASC   Fluticasone-Salmeterol 250-50 MCG/DOSE Aepb Commonly known as:  ADVAIR DISKUS   glipiZIDE 10 MG tablet Commonly known as:  GLUCOTROL   metFORMIN 1000 MG tablet Commonly known as:  GLUCOPHAGE   ondansetron 4 MG tablet Commonly known as:  ZOFRAN Replaced by:  ondansetron 4 MG/2ML Soln injection   sildenafil 20 MG tablet Commonly known as:  REVATIO     TAKE these medications   acetaminophen 160 MG/5ML solution Commonly known as:  TYLENOL Place 20.3 mLs (650 mg total) into feeding tube every 4 (four) hours as needed for mild pain (or temp > 37.5 C (99.5 F)).   aspirin 81 MG chewable tablet Place 1 tablet (81 mg total) into feeding tube daily. Start taking on:  September 06, 2018   bisacodyl 10 MG suppository Commonly known as:  DULCOLAX Place 1 suppository (10 mg total) rectally daily as needed for severe constipation.   carvedilol 6.25 MG tablet Commonly known as:  [place]  1 tablet (6.25 mg total) into feeding tube 2 (two) times daily with a meal. What changed:    how to take this  when to take this   chlorhexidine gluconate (MEDLINE KIT) 0.12 % solution Commonly known as:  PERIDEX 15 mLs by Mouth Rinse route 2 (two) times daily.   docusate 50 MG/5ML liquid Commonly known as:  COLACE Take 10 mLs (100 mg total) by mouth daily. Start taking on:  September 06, 2018   feeding supplement (PRO-STAT SUGAR FREE 64) Liqd Place 30 mLs into feeding tube daily. Start taking on:  September 06, 2018   feeding  supplement (VITAL AF 1.2 CAL) Liqd Place 1,000 mLs into feeding tube continuous.   guaiFENesin 100 MG/5ML Soln Commonly known as:  ROBITUSSIN Take 15 mLs (300 mg total) by mouth every 6 (six) hours as needed for cough or to loosen phlegm.   insulin aspart 100 UNIT/ML injection Commonly known as:  novoLOG Inject 0-20 Units into the skin every 4 (four) hours.   insulin glargine 100 UNIT/ML injection Commonly known as:  LANTUS Inject 0.2 mLs (20 Units total) into the skin every morning. Start taking on:  September 06, 2018   ipratropium-albuterol 0.5-2.5 (3) MG/3ML Soln Commonly known as:  DUONEB Take 3 mLs by nebulization every 6 (six) hours as needed.   ondansetron 4 MG/2ML Soln injection Commonly known as:  ZOFRAN Inject 2 mLs (4 mg total) into the vein every 6 (six) hours as needed for nausea or vomiting. Replaces:  ondansetron 4 MG tablet   pantoprazole sodium 40 mg/20 mL Pack Commonly known as:  PROTONIX Place 20 mLs (40 mg total) into feeding tube daily. Start taking on:  September 06, 2018   polyethylene glycol packet Commonly known as:  MIRALAX / GLYCOLAX Take 17 g by mouth daily as needed for mild constipation.       HOME MEDICATIONS PRIOR TO ADMISSION Medications Prior to Admission  Medication Sig Dispense Refill  . albuterol (PROVENTIL HFA;VENTOLIN HFA) 108 (90 BASE) MCG/ACT inhaler Inhale 2 puffs into the lungs every 6 (six) hours as needed for wheezing or shortness of breath. Please instruct in proper technique. 1 Inhaler 1  . amLODipine (NORVASC) 5 MG tablet Take 5 mg by mouth daily.    . carvedilol (COREG) 6.25 MG tablet Take 1 tablet by mouth 2 (two) times daily.    . Fluticasone-Salmeterol (ADVAIR DISKUS) 250-50 MCG/DOSE AEPB Inhale 1 puff into the lungs 2 (two) times daily. 1 each 3  . glipiZIDE (GLUCOTROL) 10 MG tablet Take 10 mg by mouth 2 (two) times daily.    . metFORMIN (GLUCOPHAGE) 1000 MG tablet Take 1 tablet by mouth 2 (two) times daily.    .  ondansetron (ZOFRAN) 4 MG tablet Take 4 mg by mouth every 8 (eight) hours as needed for nausea or vomiting.    . sildenafil (REVATIO) 20 MG tablet Take 2-5 tablets by mouth daily as needed (intercourse).        HOSPITAL MEDICATIONS . aspirin  81 mg Per Tube Daily  . carvedilol  6.25 mg Per Tube BID WC  . chlorhexidine gluconate (MEDLINE KIT)  15 mL Mouth Rinse BID  . docusate  100 mg Oral Daily  . feeding supplement (PRO-STAT SUGAR FREE 64)  30 mL Per Tube Daily  . fentaNYL (SUBLIMAZE) injection  100 mcg Intravenous Once  . insulin aspart  0-20 Units Subcutaneous Q4H  . insulin glargine  20 Units Subcutaneous [id number]  . mouth rinse  15 mL  Mouth Rinse 10 times per day  . midazolam  5 mg Intravenous Once  . pantoprazole sodium  40 mg Per Tube Daily    LABORATORY STUDIES CBC    Component Value Date/Time   WBC 10.9 (H) 09/05/2018 0357   RBC 3.91 (L) 09/05/2018 0357   HGB 11.3 (L) 09/05/2018 0357   HCT 35.2 (L) 09/05/2018 0357   PLT 145 (L) 09/05/2018 0357   MCV 90.0 09/05/2018 0357   MCH 28.9 09/05/2018 0357   MCHC 32.1 09/05/2018 0357   RDW 14.6 09/05/2018 0357   LYMPHSABS 0.4 (L) 08/28/2018 1002   MONOABS 0.4 08/28/2018 1002   EOSABS 0.0 08/28/2018 1002   BASOSABS 0.0 08/28/2018 1002   CMP    Component Value Date/Time   NA 134 (L) 09/05/2018 0357   K 4.4 09/05/2018 0357   CL 97 (L) 09/05/2018 0357   CO2 25 09/05/2018 0357   GLUCOSE 151 (H) 09/05/2018 0357   BUN 24 (H) 09/05/2018 0357   CREATININE 0.68 09/05/2018 0357   CALCIUM 8.6 (L) 09/05/2018 0357   PROT 7.2 08/27/2018 0403   ALBUMIN 3.4 (L) 08/27/2018 0403   AST 190 (H) 08/27/2018 0403   ALT 375 (H) 08/27/2018 0403   ALKPHOS 105 08/27/2018 0403   BILITOT 1.8 (H) 08/27/2018 0403   GFRNONAA >60 09/05/2018 0357   GFRAA >60 09/05/2018 0357   COAGS Lab Results  Component Value Date   INR 1.10 09/02/2018   INR 1.28 08/27/2018   INR 1.0 11/19/2007   Lipid Panel    Component Value Date/Time   CHOL 112  08/28/2018 1002   TRIG 31 09/03/2018 0545   HDL 32 (L) 08/28/2018 1002   CHOLHDL 3.5 08/28/2018 1002   VLDL 15 08/28/2018 1002   LDLCALC 65 08/28/2018 1002   HgbA1C  Lab Results  Component Value Date   HGBA1C 5.9 (H) 08/28/2018   Urinalysis    Component Value Date/Time   COLORURINE YELLOW 12/07/2009 1021   APPEARANCEUR CLEAR 12/07/2009 1021   LABSPEC >=1.030 12/07/2009 1021   PHURINE 5.0 12/07/2009 1021   GLUCOSEU NEGATIVE 12/07/2009 1021   BILIRUBINUR NEGATIVE 12/07/2009 1021   KETONESUR NEGATIVE 12/07/2009 1021   UROBILINOGEN 1.0 12/07/2009 1021   NITRITE NEGATIVE 12/07/2009 1021   LEUKOCYTESUR TRACE 12/07/2009 1021   Urine Drug Screen     Component Value Date/Time   LABOPIA NONE DETECTED 08/28/2018 1816   COCAINSCRNUR NONE DETECTED 08/28/2018 1816   LABBENZ NONE DETECTED 08/28/2018 1816   AMPHETMU NONE DETECTED 08/28/2018 1816   THCU NONE DETECTED 08/28/2018 1816   LABBARB NONE DETECTED 08/28/2018 1816    Alcohol Level No results found for: ETH   SIGNIFICANT DIAGNOSTIC STUDIES  Ct Angio Head W Or Wo Contrast Ct Angio Neck W Or Wo Contrast Ct Cerebral Perfusion W Contrast 08/28/2018 IMPRESSION:  1. Right M2 inferior division origin occlusion and nonocclusive thrombus of distal right M1. Right M2 thrombus approximately 13 mm in length. Good downstream collateralization.  2. Negative CT brain perfusion for acute stroke by perfusion criteria. Mild asymmetry of transit time, increased in the right posterior cerebral hemisphere, likely related to M2 thrombus.  3. Patent carotid and vertebral arteries of the neck. No dissection, aneurysm, or significant stenosis by NASCET criteria.  4. No additional intracranial large vessel occlusion, aneurysm, or vascular malformation.  5. Mild right vertebral artery origin stenosis with calcified plaque.  6. Mild right proximal ICA stenosis with calcified plaque.  7. Mild right and moderate left cavernous and paraclinoid ICA  stenosis with calcified plaque.    Ct Cerebral Perfusion W Contrast 08/28/2018 IMPRESSION:  1. Right lentiform nucleus infarction, ASPECTS 9 on noncontrast CT of head. Pseudo normalized perfusion associated with this area visible infarction.  2. No additional region of acute infarction by perfusion criteria identified.  3. Mildly increased transit time in the right posterior cerebral hemisphere likely related collateralization of right M2 inferior division occlusion.  Ct Head Code Stroke Wo Contrast 08/28/2018 IMPRESSION:  1. Hypoattenuation within the right lentiform nucleus spanning up to approximately 3.4 cm (5 cc) likely representing an acute or subacute infarction. No associated hemorrhage or mass effect. Increased conspicuity from prior CT of head.  2. ASPECTS is 9    Ct Head Code Stroke Wo Contrast` 08/27/2018 IMPRESSION:  1. No acute hemorrhage.  2. ASPECTS is 10.    Cerebral Angiogram S/P RT common carotid arteriogram followed by complete revascularization of occluded distal RT MCA M1 and co dominant inf division withx 1 pass with 33m x 358membotrap retriever device achieving a TICI 3 revascularization  Transthoracic Echocardiogram  08/25/2018 Study Conclusions - Left ventricle: The cavity size was normal. There was mild concentric hypertrophy. Systolic function was normal. The estimated ejection fraction was in the range of 50% to 55%. There was septal-lateral dyssynchrony. Doppler parameters are consistent with abnormal left ventricular relaxation (grade 1 diastolic dysfunction). - Ventricular septum: Septal motion showed abnormal function and dyssynergy. These changes are consistent with intraventricular conduction delay. - Aortic valve: Trileaflet; mildly thickened, moderately calcified leaflets. Morphologically, there appears to be mild calcific aortic stenosis. - Mitral valve: Mildly calcified annulus. - Right ventricle: The cavity  size was mildly dilated. Systolic function was moderately reduced. - Right atrium: The atrium was mildly dilated. - Tricuspid valve: There was moderate regurgitation. - Pulmonary arteries: PA peak pressure: 57 mm Hg (S). - Inferior vena cava: The vessel was dilated. The respirophasic diameter changes were blunted (<50%), consistent with elevated central venous pressure. Estimated CVP 15 mmHg.   Ct Head Wo Contrast 08/29/2018 IMPRESSION:  1. Evolving acute RIGHT basal ganglia nonhemorrhagic infarct with petechial hemorrhage. Additional small acute infarcts better demonstrated on prior MRI.  2. Acute pontine lacunar infarct versus artifact.   3. Moderate to severe chronic small vessel ischemic changes, advanced for age.  4. Moderate parenchymal brain volume loss, advanced for age.    Mr Brain Wo Contrast 08/28/2018 IMPRESSION:  Areas of acute infarction within the right middle cerebral artery territory as outlined above. Largest region affects the corpus striatum of the basal ganglia on the right. Petechial blood products present in that location. Scattered other few small cortical infarctions within the temporal and parietal lobes. Minimal petechial blood products associated with the small posterior parietal cortical infarctions.    LE venous doppler  08/31/2018 No DVT     HISTORY OF PRESENT ILLNESS (From Dr KiCecil Cobbs&P 08/28/2018) LaAuthor Hatlestads a [age].o. male who [facility] with shortness of breath, increasing over the past 2 weeks.  He developed gastroenteritis with diarrhea.  He had elevated troponins which were felt to be due to demand ischemia.  His AKI has resolved with a creatinine of 0.89 today.  He was felt to be excluded from IV TPA due to PT of 15.9 and was outside the IV TPA window but [facility] At baseline, he lives alone and takes care of all of his own activities of daily living does not need  any help even with bills or other things.  He denies current shortness of breath or chest pain.  LKW: 7 PM tpa given?: no, elevated prothrombin time Modified Rankin scale: 2    HOSPITAL COURSE Mr. Jamall Strohmeier is a 67 y.o. male with history of coronary artery disease previous MI / stenting, COPD, diabetes mellitus, gastroesophageal reflux disease, hypertension, hyperlipidemia, prostate cancer, and peripheral vascular disease presenting with left-sided weakness. He did not receive IV t-PA due to elevated protime.  Stroke: Rt MCA, MCA/ACA, MCA/PCA infarcts - embolic - source unknown, large vessel atherosclerosis versus cardioembolic source  Resultant  Left hemiparesis  CT head - Hypoattenuation within the right lentiform nucleus likely representing an acute or subacute infarction.  CTA H&N - Right M2 inferior division origin occlusion and nonocclusive thrombus of distal right M1. Right M2 thrombus approximately 13 mm in length.  DSA right occluded M1 and M2 status post IR with TICI3 reperfusion  MRI head - right MCA, MCA/ACA, MCA/PCA infarcts with right BG petechial hemorrhagic transformation  CT repeat right MCA infarct with petechial hemorrhage at right BG  LE venous Doppler no DVT  2D Echo  - EF 50 - 55%. No cardiac source of emboli identified.   Consider TEE/loop recorder to evaluate cardioembolic source in the future based on potential functional improvement  LDL - 65  HgbA1c - 5.9  UDS - negative  VTE prophylaxis - [place] Heparin  Diet - NPO on tube feeding  No antithrombotic prior to admission, now on aspirin 81 mg daily.  No DAPT at this time given potential PEG tube placement.  Patient will be counseled to be compliant with his antithrombotic medications  Ongoing aggressive stroke risk factor management  Therapy recommendations: LTAC  Disposition:  [facility]  Respiratory failure  Not able to tolerate extubation  Had a tracheostomy  Still on  ventilation  CCM on board  CTA chest no PE  Still on steroids  Planning for LTAC  Hypertension  Stable  BP goal < 160 given hemorrhagic conversion  Labetalol PRN  Long-term BP goal normotensive  Diabetes  HgbA1c 5.9, goal < 7.0  Controlled  Hyperglycemia much improved  On Lantus 25U  SSI  CBG monitoring  Dysphagia  Had a trach on ventilation  Speech on board  Currently on tube feeding  Plan for PEG next week before LTAC placement  Tobacco abuse  Current smoker  Smoking cessation counseling provided  On nicotine patch  Pt is willing to quit  Other Stroke Risk Factors  Advanced age  Coronary artery disease/MI status post stent 2009  PVD  Other Active Problems  Hyperkalemia - 5.5 -> Kayexalate per CCM-> 5.1->5.1->4.9-4.6--5.3-5.3  Prostate cancer  Acute gastroenteritis, resolved  AKI, resolved  Leukocytosis WBC 9.3-10.7-6.6  Per pulmonary - OK to discharge to [facility]     Malnutrition Type:  Nutrition Problem: Increased nutrient needs Etiology: chronic illness(COPD)   Malnutrition Characteristics: NPO on ventilator   Nutrition Interventions: Weight:      Wt Readings from Last 1 Encounters:  09/02/18 94.9 kg    Ideal Body Weight:  80.9 kg  BMI:  Body mass index is 28.37 kg/m.  Estimated Nutritional Needs:   Kcal:  1970 kcals   Protein:  115-130 grams  Fluid:  >1.9 L/day  PEG placement planned   DISCHARGE EXAM Vitals:   09/05/18 0755 09/05/18 0800 09/05/18 0900 09/05/18 1000  BP:  126/67 (!) 124/92 120/62  Pulse:  79 80 81  Resp:  (!) 21 11 (!)  28  Temp: 98.8 F (37.1 C)     TempSrc: Oral     SpO2:  100% 98% 100%  Weight:      Height:       Awake, alert.  Trach collar.  [place]odds appropriately to all questions.  LUE 3/5 and LLE 2/5 strength.  RUE and RLE 5/5.  Slight reduced sensation on the left side.  Face symmetric.  Tongue midline.  EOMI.  Coordination intact.    Discharge Diet     Diet Order            Diet NPO time specified  Diet effective midnight             liquids  DISCHARGE PLAN  [facility]  aspirin 81 mg daily for secondary stroke prevention.  Ongoing risk factor control by Primary Care Physician at time of discharge  Follow-up Dione Housekeeper, MD in 2 weeks.  Follow-up in [facility] in 4 weeks, office to schedule an appointment.   30 minutes were spent preparing discharge.  Mikey Bussing PA-C [facility] Pager [phone] 09/05/2018, 10:50 AM

## 2018-09-04 NOTE — Progress Notes (Signed)
[facility] Progress Note Patient Name: Clayton [name] DOB: 03/15/1951 MRN: [id number]   Date of Service  09/04/2018  HPI/Events of Note  Call from nurse reporting hematuria with clots following discontinuation of foley today.  On subq heparin.  Hgb stable.  Past h/o of BG bleed  eICU Interventions  Plan: 1. Bladder scan 2. D/C heparin for now - If urine clears consider resumption of subq heparin 3. SCDs 4.  Continue to monitor HGB     Intervention Category Intermediate Interventions: Bleeding - evaluation and treatment with blood products  Nelta Caudill 09/04/2018, 7:50 PM

## 2018-09-04 NOTE — Progress Notes (Signed)
Pts foley pulled earlier today. Pt just voided dark red urine with clots. [place] notified. Will bladder scan.  Clint Bolder, RN 09/04/18 7:35 PM

## 2018-09-04 NOTE — Procedures (Signed)
Cortrak  Person Inserting Tube:  Clayton [name], RD Tube Type:  Cortrak - 55 inches Tube Location:  Left nare Initial Placement:  Stomach Secured by: Bridle Technique Used to Measure Tube Placement:  Documented cm marking at nare/ corner of mouth Cortrak Secured At:  75 cm    Cortrak Tube Team Note:  Consult received to place a Cortrak feeding tube.   No x-ray is required. RN may begin using tube.    If the tube becomes dislodged please keep the tube and contact the Cortrak team at [email or web address] (password [email or web address]) for replacement.  If after hours and replacement cannot be delayed, place a NG tube and confirm placement with an abdominal x-ray.    Clayton [name], [place], [place] Pager [phone] After Hours Pager

## 2018-09-04 NOTE — Progress Notes (Signed)
STROKE TEAM PROGRESS NOTE   SUBJECTIVE (INTERVAL HISTORY) No family is at bedside. The patient is on trach and vent, but awake alert following all simple commands and mouthing words. Not able to wean off vent yet, plan for [facility] discharge tomorrow.    OBJECTIVE Vitals:   09/04/18 0800 09/04/18 0900 09/04/18 1000 09/04/18 1145  BP: 124/73 (!) 161/77 133/69   Pulse: 85 93 84   Resp: (!) 25 (!) 32 (!) 31   Temp: 99.4 F (37.4 C)   98.2 F (36.8 C)  TempSrc: Oral   Oral  SpO2: 93% 93% 92%   Weight:      Height:        CBC:  Recent Labs  Lab 09/03/18 0545 09/04/18 0317  WBC 6.6 9.7  HGB 12.2* 12.6*  HCT 37.6* 37.7*  MCV 91.5 89.5  PLT 161 416    Basic Metabolic Panel:  Recent Labs  Lab 09/03/18 0545 09/04/18 0317  NA 135 134*  K 5.3* 4.8  CL 98 96*  CO2 28 26  GLUCOSE 80 94  BUN 27* 24*  CREATININE 0.62 0.65  CALCIUM 9.0 8.9  MG 1.7 1.6*  PHOS 4.0 4.4    Lipid Panel:     Component Value Date/Time   CHOL 112 08/28/2018 1002   TRIG 31 09/03/2018 0545   HDL 32 (L) 08/28/2018 1002   CHOLHDL 3.5 08/28/2018 1002   VLDL 15 08/28/2018 1002   LDLCALC 65 08/28/2018 1002   HgbA1c:  Lab Results  Component Value Date   HGBA1C 5.9 (H) 08/28/2018   Urine Drug Screen:     Component Value Date/Time   LABOPIA NONE DETECTED 08/28/2018 1816   COCAINSCRNUR NONE DETECTED 08/28/2018 1816   LABBENZ NONE DETECTED 08/28/2018 1816   AMPHETMU NONE DETECTED 08/28/2018 1816   THCU NONE DETECTED 08/28/2018 1816   LABBARB NONE DETECTED 08/28/2018 1816    Alcohol Level No results found for: ETH  IMAGING  Ct Angio Head W Or Wo Contrast Ct Angio Neck W Or Wo Contrast Ct Cerebral Perfusion W Contrast 08/28/2018 IMPRESSION:  1. Right M2 inferior division origin occlusion and nonocclusive thrombus of distal right M1. Right M2 thrombus approximately 13 mm in length. Good downstream collateralization.  2. Negative CT brain perfusion for acute stroke by perfusion criteria.  Mild asymmetry of transit time, increased in the right posterior cerebral hemisphere, likely related to M2 thrombus.  3. Patent carotid and vertebral arteries of the neck. No dissection, aneurysm, or significant stenosis by NASCET criteria.  4. No additional intracranial large vessel occlusion, aneurysm, or vascular malformation.  5. Mild right vertebral artery origin stenosis with calcified plaque.  6. Mild right proximal ICA stenosis with calcified plaque.  7. Mild right and moderate left cavernous and paraclinoid ICA stenosis with calcified plaque.    Ct Cerebral Perfusion W Contrast 08/28/2018 IMPRESSION:  1. Right lentiform nucleus infarction, ASPECTS 9 on noncontrast CT of head. Pseudo normalized perfusion associated with this area visible infarction.  2. No additional region of acute infarction by perfusion criteria identified.  3. Mildly increased transit time in the right posterior cerebral hemisphere likely related collateralization of right M2 inferior division occlusion.  Ct Head Code Stroke Wo Contrast 08/28/2018 IMPRESSION:  1. Hypoattenuation within the right lentiform nucleus spanning up to approximately 3.4 cm (5 cc) likely representing an acute or subacute infarction. No associated hemorrhage or mass effect. Increased conspicuity from prior CT of head.  2. ASPECTS is 9    Ct Head  Code Stroke Wo Contrast` 08/27/2018 IMPRESSION:  1. No acute hemorrhage.  2. ASPECTS is 10.    Cerebral Angiogram S/P RT common carotid arteriogram followed by complete revascularization of occluded distal RT MCA M1 and co dominant inf division withx 1 pass with 2mm x 87mm embotrap retriever device achieving a TICI 3 revascularization  Transthoracic Echocardiogram  08/25/2018 Study Conclusions - Left ventricle: The cavity size was normal. There was mild   concentric hypertrophy. Systolic function was normal. The   estimated ejection fraction was in the range of 50% to 55%. There   was  septal-lateral dyssynchrony. Doppler parameters are   consistent with abnormal left ventricular relaxation (grade 1   diastolic dysfunction). - Ventricular septum: Septal motion showed abnormal function and   dyssynergy. These changes are consistent with intraventricular   conduction delay. - Aortic valve: Trileaflet; mildly thickened, moderately calcified   leaflets. Morphologically, there appears to be mild calcific   aortic stenosis. - Mitral valve: Mildly calcified annulus. - Right ventricle: The cavity size was mildly dilated. Systolic   function was moderately reduced. - Right atrium: The atrium was mildly dilated. - Tricuspid valve: There was moderate regurgitation. - Pulmonary arteries: PA peak pressure: 57 mm Hg (S). - Inferior vena cava: The vessel was dilated. The respirophasic   diameter changes were blunted (< 50%), consistent with elevated   central venous pressure. Estimated CVP 15 mmHg.   Ct Head Wo Contrast 08/29/2018 IMPRESSION:  1. Evolving acute RIGHT basal ganglia nonhemorrhagic infarct with petechial hemorrhage. Additional small acute infarcts better demonstrated on prior MRI.  2. Acute pontine lacunar infarct versus artifact.   3. Moderate to severe chronic small vessel ischemic changes, advanced for age.  4. Moderate parenchymal brain volume loss, advanced for age.    Mr Brain Wo Contrast 08/28/2018 IMPRESSION:  Areas of acute infarction within the right middle cerebral artery territory as outlined above. Largest region affects the corpus striatum of the basal ganglia on the right. Petechial blood products present in that location. Scattered other few small cortical infarctions within the temporal and parietal lobes. Minimal petechial blood products associated with the small posterior parietal cortical infarctions.    LE venous doppler no DVT    PHYSICAL EXAM Blood pressure 133/69, pulse 84, temperature 98.2 F (36.8 C), temperature source Oral, resp.  rate (!) 31, height 6' (1.829 m), weight 91 kg, SpO2 92 %.   Elderly middle-aged African-American male who has trach and on ventilation. Afebrile. Head is nontraumatic. Neck is supple without bruit.    Cardiac exam no murmur or gallop. Lungs are clear to auscultation. Distal pulses are well felt.  Neurological Exam Patient has trach and on ventilation. He is awake and alert and following all simple commands. Eyes open.  Pupil bilateral equal size, EOMI, blinking to visual threat bilaterally.  Tracking objects in all fields.  Fundi were not visualized.  Facial symmetric. Tongue midline.  Right upper extremity 4+/5, 3+/5 LUE proximal but 3-/5 distal.  BLE proximal 0/5 proximal but knee flexion 3/5 on the right and 2/5 on the left, bilateral foot DF 3/5 on the right , minimal movement on the left DF. DTR 1+ bilaterally. Babinski negative bilateral. Sensation symmetric, coordination not corporative and gait not tested.  ASSESSMENT/PLAN Mr. Breslin Hemann is a 67 y.o. male with history of coronary artery disease previous MI / stenting, COPD, diabetes mellitus, gastroesophageal reflux disease, hypertension, hyperlipidemia, prostate cancer, and peripheral vascular disease presenting with left-sided weakness. He did not receive IV t-PA  due to elevated protime.  Stroke: Rt MCA, MCA/ACA, MCA/PCA infarcts - embolic - source unknown, large vessel atherosclerosis versus cardioembolic source  Resultant  Left hemiparesis  CT head - Hypoattenuation within the right lentiform nucleus likely representing an acute or subacute infarction.  CTA H&N - Right M2 inferior division origin occlusion and nonocclusive thrombus of distal right M1. Right M2 thrombus approximately 13 mm in length.   DSA right occluded M1 and M2 status post IR with TICI3 reperfusion  MRI head - right MCA, MCA/ACA, MCA/PCA infarcts with right BG petechial hemorrhagic transformation  CT repeat right MCA infarct with petechial hemorrhage at  right BG  LE venous Doppler no DVT  2D Echo  - EF 50 - 55%. No cardiac source of emboli identified.   May consider TEE/loop recorder to evaluate cardioembolic source in the future based on potential functional improvement  LDL - 65  HgbA1c - 5.9  UDS - negative  VTE prophylaxis - [place] Heparin  Diet - NPO on tube feeding  No antithrombotic prior to admission, now on aspirin 81 mg daily. Continue ASA on discharge. No DAPT at this time given trach site bleeding  Patient will be counseled to be compliant with his antithrombotic medications  Ongoing aggressive stroke risk factor management  Therapy recommendations: LTAC  Disposition:  Pending  Respiratory failure  Not able to tolerate extubation  Had a tracheostomy  Still on ventilation  CCM on board  CTA chest no PE  Still on steroids - last dose tomorrow  Planning for LTAC tomorrow  Hypertension  Stable . BP goal < 160 given hemorrhagic conversion . Labetalol PRN . Long-term BP goal normotensive  Diabetes  HgbA1c 5.9, goal < 7.0  Controlled  Hyperglycemia much improved  On Lantus 20U  SSI  CBG monitoring  Dysphagia  Had a trach on ventilation  Speech on board  Currently on tube feeding  May need PEG at [facility]   Tobacco abuse  Current smoker  Smoking cessation counseling provided  On nicotine patch  Pt is willing to quit  Other Stroke Risk Factors  Advanced age  Coronary artery disease/MI status post stent 2009  PVD  Other Active Problems  Hyperkalemia - 5.5 -> Kayexalate per CCM-> 5.1->5.1->4.9-4.6--5.3-5.3-4.8  Prostate cancer  Acute gastroenteritis, resolved  AKI, resolved  Leukocytosis WBC 9.3-10.7-6.6-9.7  Hospital day # 11  This patient is critically ill and at significant risk of neurological worsening, death and care requires constant monitoring of vital signs, hemodynamics,respiratory and cardiac monitoring, extensive review of multiple databases, frequent  neurological assessment, discussion with family, other specialists and medical decision making of high complexity. I spent 35 minutes of neurocritical care time  in the care of  this patient.   Rosalin Hawking, MD PhD Stroke Neurology 09/05/2018 12:52 AM     To contact Stroke Continuity provider, please refer to [email or web address] After hours, contact General Neurology

## 2018-09-04 NOTE — Progress Notes (Addendum)
NAME:  Clayton [name], MRN:  [id number], DOB:  Mar 29, 1951, LOS: 11 ADMISSION DATE:  08/24/2018, CONSULTATION DATE:  08/28/2018 REFERRING MD:  Dr. Leonel Ramsay, CHIEF COMPLAINT:  Stroke  Brief History   40 yoM originally admitted to [facility] on 12/9 with AECOPD, AKI, dehydration s/p N/V for several days found with acute left sided deficits.  Code stroke initiated.  Found to have acute Right M2 occlusion.  TPA not given.  Transferred emergently to [facility] and taken to Neuro IR for thrombectomy.  PCCM consulted for vent management.  Past Medical History  Tobacco abuse, COPD, chronic hypoxic respiratory failure on home O2 2Ls, HTN, HLD, DM, CAD w/prior MI, GERD, and prostate [place] [facility] Events   12/9 Admit at [facility] 12/13 tx to [facility] for thrombectomy 09/02/2018- failed extubation required tracheostomy 12/19>> Weaning on 40% 10/5  Consults:  1212 neurology Procedures:  12/13 cerebral angiogram  12/13 ETT >>12/18 09/02/2018 tracheostomy placed>> 09/03/2018 core track ordered>>  Significant Diagnostic Tests:  12/10 TTE >> EF 50-55%, G1DD, mild AS, mod TR, PAP 57 mmHg  CTA chest 08/30/2018-no pulmonary embolism, COPD changes of bilateral effusions, compressive atelectasis, left upper lobe pulmonary nodule.  09/03/2018 portable chest x-ray reviewed by me reveals tracheostomy in good position without acute issues Micro Data:  12/10 MRSA PCR  >> neg 12/10 Cdiff  >> neg 12/10 GI panel >> neg  Antimicrobials:  12/9 azithro >> off 12/13 ancef pre op off  Interim history/subjective:  Currently on CPAP/PS wean  via tracheostomy at 40%, appears comfortable Some oozing from around trach, clot is intact  Objective   Blood pressure 133/79, pulse 77, temperature 99.4 F (37.4 C), temperature source Oral, resp. rate 16, height 6' (1.829 m), weight 91 kg, SpO2 94 %.    Vent Mode: PRVC FiO2 (%):  [40 %] 40 % Set Rate:  [16 bmp] 16 bmp Vt Set:  [620 mL] 620 mL PEEP:  [5 cmH20] 5  cmH20 Plateau Pressure:  [14 cmH20-17 cmH20] 15 cmH20   Intake/Output Summary (Last 24 hours) at 09/04/2018 6270 Last data filed at 09/04/2018 0800 Gross per 24 hour  Intake 834.25 ml  Output 3075 ml  Net -2240.75 ml   Filed Weights   09/01/18 0500 09/02/18 0452 09/03/18 0451  Weight: 94.5 kg 94.9 kg 91 kg   Examination: General: 67 year old male currently with tracheostomy, weaning and   awake and alert follows commands,  no acute distress HEENT: Tracheostomy is in place with some oozing from site, clots intact Neuro: Left sided weakness. Alert, follows commands , appropriate CV: Sounds are distant, S1. S2. RRR, No RMG PULM: even/non-labored, lungs bilaterally diminished, few rhonchi JJ:[id number], non-tender, bsx4 active, core track team to place tube 12/20 Extremities: warm/dry, 1+ edema , some abrasions to right  Skin: no rashes or lesions, few abrasions as noted above  Resolved Hospital Problem list    Assessment & Plan:  Right MCA stroke s/p EVR and complete revascularization, was extubated in the PACU post op but mental status was poor, unable to protect his airway and was reintubated.  He was extubated on 09/02/2018 and again failed extubation and required tracheostomy placed at that time.  Acute right MCA embolic stroke, L sided weakness , per nursing improving Awake alert, interactive and apporpriate Plan:  Per neurology More awake 09/04/2018  Acute respiratory failure with hypoxemia: Failed extubation 09/02/2018, emergency tracheostomy was performed 09/02/2018 COPD PAH Weaning 12/20 + 359  Plan Continue CPAP weaning trials Continue negative I&O as tolerated PMV  trials per speech  ID Temperature currently 99.5 WBC 9.7 Plan Continue to monitor fever curve Trend WBC Culture as id clinically indicated  COPD exacerbation: Plan Wean Solu-Medrol to 20 mg IV daily 09/03/2018.  Can stop 12/21 Scheduled Bronchodilator as ordered  Hyperglycemia Hypoglycemia  12/20 Plan Decrease Lantus to 20 units daily Sliding-scale insulin protocol Cor Track placement Start TF once Cor Track placed Will need PEG placement prior to LTAC placement  History CAD and PTCI Plan Aspirin Telemetry monitoring EKG prn  HTN: No acute issues Plan  Continue Coreg BID per tube as scheduled   Hyperkalemia: resolving Hypo mag Plan: [place] BMET  Nutrition Plan Cor Track placement 12/20' Resume TF once placed   Should be ready for LTAC placement next week as long as no further complications, and PEG placed .  cct 25  min  Magdalen Spatz, AGACNP-BC Maryanna Shape PCCM Pager [phone]  Until 3 pm If no answer page [phone] 09/04/2018, 9:38 AM

## 2018-09-05 ENCOUNTER — Inpatient Hospital Stay
Admission: RE | Admit: 2018-09-05 | Discharge: 2018-10-05 | Disposition: A | Discharge status: Skilled Nursing Facility | Payer: Medicare Other | Source: Other Acute Inpatient Hospital | Attending: Internal Medicine | Admitting: Internal Medicine

## 2018-09-05 ENCOUNTER — Other Ambulatory Visit (HOSPITAL_COMMUNITY): Payer: Self-pay

## 2018-09-05 ENCOUNTER — Inpatient Hospital Stay (HOSPITAL_COMMUNITY): Payer: Medicare Other

## 2018-09-05 DIAGNOSIS — J9690 Respiratory failure, unspecified, unspecified whether with hypoxia or hypercapnia: Secondary | ICD-10-CM

## 2018-09-05 DIAGNOSIS — I2721 Secondary pulmonary arterial hypertension: Secondary | ICD-10-CM | POA: Diagnosis present

## 2018-09-05 DIAGNOSIS — Z4659 Encounter for fitting and adjustment of other gastrointestinal appliance and device: Secondary | ICD-10-CM

## 2018-09-05 DIAGNOSIS — I63411 Cerebral infarction due to embolism of right middle cerebral artery: Secondary | ICD-10-CM | POA: Diagnosis present

## 2018-09-05 DIAGNOSIS — I214 Non-ST elevation (NSTEMI) myocardial infarction: Secondary | ICD-10-CM | POA: Diagnosis present

## 2018-09-05 DIAGNOSIS — J449 Chronic obstructive pulmonary disease, unspecified: Secondary | ICD-10-CM | POA: Diagnosis present

## 2018-09-05 DIAGNOSIS — J398 Other specified diseases of upper respiratory tract: Secondary | ICD-10-CM

## 2018-09-05 DIAGNOSIS — J9621 Acute and chronic respiratory failure with hypoxia: Secondary | ICD-10-CM | POA: Diagnosis present

## 2018-09-05 DIAGNOSIS — R4182 Altered mental status, unspecified: Secondary | ICD-10-CM | POA: Diagnosis present

## 2018-09-05 HISTORY — DX: Cerebral infarction due to embolism of right middle cerebral artery: I63.411

## 2018-09-05 HISTORY — DX: Secondary pulmonary arterial hypertension: I27.21

## 2018-09-05 HISTORY — DX: Altered mental status, unspecified: R41.82

## 2018-09-05 HISTORY — DX: Non-ST elevation (NSTEMI) myocardial infarction: I21.4

## 2018-09-05 HISTORY — DX: Chronic obstructive pulmonary disease, unspecified: J44.9

## 2018-09-05 HISTORY — DX: Acute and chronic respiratory failure with hypoxia: J96.21

## 2018-09-05 LAB — GLUCOSE, CAPILLARY
Glucose-Capillary: 159 mg/dL — ABNORMAL HIGH (ref 70–99)
Glucose-Capillary: 165 mg/dL — ABNORMAL HIGH (ref 70–99)

## 2018-09-05 LAB — BASIC METABOLIC PANEL
Anion gap: 12 (ref 5–15)
BUN: 24 mg/dL — ABNORMAL HIGH (ref 8–23)
CALCIUM: 8.6 mg/dL — AB (ref 8.9–10.3)
CO2: 25 mmol/L (ref 22–32)
Chloride: 97 mmol/L — ABNORMAL LOW (ref 98–111)
Creatinine, Ser: 0.68 mg/dL (ref 0.61–1.24)
GFR calc Af Amer: >60 mL/min (ref >60)
GFR calc non Af Amer: >60 mL/min (ref >60)
Glucose, Bld: 151 mg/dL — ABNORMAL HIGH (ref 70–99)
Potassium: 4.4 mmol/L (ref 3.5–5.1)
Sodium: 134 mmol/L — ABNORMAL LOW (ref 135–145)

## 2018-09-05 LAB — CBC
HCT: 35.2 % — ABNORMAL LOW (ref 39.0–52.0)
Hemoglobin: 11.3 g/dL — ABNORMAL LOW (ref 13.0–17.0)
MCH: 28.9 pg (ref 26.0–34.0)
MCHC: 32.1 g/dL (ref 30.0–36.0)
MCV: 90 fL (ref 80.0–100.0)
Platelets: 145 10*3/uL — ABNORMAL LOW (ref 150–400)
RBC: 3.91 MIL/uL — ABNORMAL LOW (ref 4.22–5.81)
RDW: 14.6 % (ref 11.5–15.5)
WBC: 10.9 10*3/uL — ABNORMAL HIGH (ref 4.0–10.5)
nRBC: 0 % (ref 0.0–0.2)

## 2018-09-05 LAB — MAGNESIUM: Magnesium: 1.7 mg/dL (ref 1.7–2.4)

## 2018-09-05 MED ORDER — DOCUSATE SODIUM 50 MG/5ML PO LIQD: 100.0000 mg | Freq: Every day | ORAL | 0 refills | Status: DC

## 2018-09-05 MED ORDER — PANTOPRAZOLE SODIUM 40 MG PO PACK: 40.0000 mg | PACK | Freq: Every day | ORAL | Status: DC

## 2018-09-05 MED ORDER — ASPIRIN 81 MG PO CHEW: 81.0000 mg | CHEWABLE_TABLET | Freq: Every day | ORAL | Status: DC

## 2018-09-05 MED ORDER — INSULIN GLARGINE 100 UNIT/ML ~~LOC~~ SOLN: 20.0000 [IU] | SUBCUTANEOUS | 11 refills | Status: DC

## 2018-09-05 MED ORDER — VITAL AF 1.2 CAL PO LIQD: 1000.0000 mL | ORAL | Status: DC

## 2018-09-05 MED ORDER — POLYETHYLENE GLYCOL 3350 17 G PO PACK: 17.0000 g | PACK | Freq: Every day | ORAL | 0 refills | Status: DC | PRN

## 2018-09-05 MED ORDER — INSULIN ASPART 100 UNIT/ML ~~LOC~~ SOLN: 0.0000 [IU] | SUBCUTANEOUS | 11 refills | Status: DC

## 2018-09-05 MED ORDER — GUAIFENESIN 100 MG/5ML PO SOLN: 15.0000 mL | Freq: Four times a day (QID) | ORAL | 0 refills | Status: DC | PRN

## 2018-09-05 MED ORDER — GUAIFENESIN 100 MG/5ML PO SOLN: 15.0000 mL | Freq: Four times a day (QID) | ORAL | Status: DC | PRN

## 2018-09-05 MED ORDER — POLYETHYLENE GLYCOL 3350 17 G PO PACK: 17.0000 g | PACK | Freq: Every day | ORAL | Status: DC | PRN

## 2018-09-05 MED ORDER — CARVEDILOL 6.25 MG PO TABS: 6.2500 mg | ORAL_TABLET | Freq: Two times a day (BID) | ORAL | Status: AC

## 2018-09-05 MED ORDER — BISACODYL 10 MG RE SUPP: 10.0000 mg | Freq: Every day | RECTAL | 0 refills | Status: DC | PRN

## 2018-09-05 MED ORDER — CHLORHEXIDINE GLUCONATE 0.12% ORAL RINSE (MEDLINE KIT): 15.0000 mL | Freq: Two times a day (BID) | OROMUCOSAL | 0 refills | Status: AC

## 2018-09-05 MED ORDER — IPRATROPIUM-ALBUTEROL 0.5-2.5 (3) MG/3ML IN SOLN: 3.0000 mL | Freq: Four times a day (QID) | RESPIRATORY_TRACT | Status: DC | PRN

## 2018-09-05 MED ORDER — ONDANSETRON HCL 4 MG/2ML IJ SOLN: 4.0000 mg | Freq: Four times a day (QID) | INTRAMUSCULAR | 0 refills | Status: DC | PRN

## 2018-09-05 MED ORDER — ACETAMINOPHEN 160 MG/5ML PO SOLN: 650.0000 mg | ORAL | 0 refills | Status: DC | PRN

## 2018-09-05 MED ORDER — PRO-STAT SUGAR FREE PO LIQD: 30.0000 mL | Freq: Every day | ORAL | 0 refills | Status: DC

## 2018-09-05 NOTE — Progress Notes (Signed)
Report has been given to nurse on [facility]. Family has also been updated about the move to 5E28.

## 2018-09-05 NOTE — Progress Notes (Addendum)
Spoke w Chase Caller of [facility], she states that patient has a room reday for his DC today. Room 5E28, accepting MD Dr Merton Border, no EMTALA or MD to MD report required, RN to call report to [phone]. Requested DC summary be completed by [facility] PA. CM will take to [facility], 5E when ready. Provided today's Critical care progress note to [facility] unit clerk.  Updated patient and bedside nurse of transfer today.   Carles Collet RN BSN CPN Case Management [phone] Please refer to [facility] for CM provider on call

## 2018-09-05 NOTE — Progress Notes (Signed)
NAME:  Clayton [name], MRN:  [id number], DOB:  January 09, 1951, LOS: 12 ADMISSION DATE:  08/24/2018, CONSULTATION DATE:  08/28/2018 REFERRING MD:  Dr. Leonel Ramsay, CHIEF COMPLAINT:  Stroke  Brief History   43 yoM originally admitted [facility] on 12/9 with AECOPD, AKI, dehydration s/p N/V for several days found with acute left sided deficits.  Code stroke initiated.  Found to have acute Right M2 occlusion.  TPA not given.  Transferred emergently to [facility] and taken to Neuro IR for thrombectomy.  PCCM consulted for vent management.  Past Medical History  Tobacco abuse, COPD, chronic hypoxic respiratory failure on home O2 2Ls, HTN, HLD, DM, CAD w/prior MI, GERD, and prostate [place] [facility] Events   12/9 Admit at [facility] 12/13 tx to [facility] for thrombectomy 09/02/2018- failed extubation required tracheostomy 12/19>> Weaning on 40% 10/5, bleeding from trach 12/21 >> Trach bleeding stopped  Consults:  1212 neurology  Procedures:  12/13 cerebral angiogram  12/13 ETT >>12/18 09/02/2018 tracheostomy placed>> 09/03/2018 core track ordered>>  Significant Diagnostic Tests:  12/10 TTE >> EF 50-55%, G1DD, mild AS, mod TR, PAP 57 mmHg  CTA chest 08/30/2018-no pulmonary embolism, COPD changes of bilateral effusions, compressive atelectasis, left upper lobe pulmonary nodule.  09/03/2018 portable chest x-ray reviewed by me reveals tracheostomy in good position without acute issues Micro Data:  12/10 MRSA PCR  >> neg 12/10 Cdiff  >> neg 12/10 GI panel >> neg  Antimicrobials:  12/9 azithro >> off 12/13 ancef pre op off  Interim history/subjective:  No more oozing from the trach site Reported episode of hematuria last night which is cleared up. Still asleep at the time of my examination.  Weaning on pressure support 10/5  Objective   Blood pressure 136/69, pulse 80, temperature 98.8 F (37.1 C), temperature source Oral, resp. rate (!) 30, height 6' (1.829 m), weight 91 kg, SpO2 99 %.    Vent  Mode: CPAP;PSV FiO2 (%):  [40 %] 40 % Set Rate:  [16 bmp] 16 bmp Vt Set:  [620 mL] 620 mL PEEP:  [5 cmH20] 5 cmH20 Pressure Support:  [10 cmH20] 10 cmH20 Plateau Pressure:  [13 cmH20-14 cmH20] 14 cmH20   Intake/Output Summary (Last 24 hours) at 09/05/2018 0828 Last data filed at 09/05/2018 0600 Gross per 24 hour  Intake 2316.33 ml  Output 1725 ml  Net 591.33 ml   Filed Weights   09/01/18 0500 09/02/18 0452 09/03/18 0451  Weight: 94.5 kg 94.9 kg 91 kg   Examination: Gen:      No acute distress HEENT:  EOMI, sclera anicteric Neck:     No masses; no thyromegaly, trach in place.  No bleeding noted from trach site. Lungs:    Clear to auscultation bilaterally; normal respiratory effort CV:         Regular rate and rhythm; no murmurs Abd:      + bowel sounds; soft, non-tender; no palpable masses, no distension Ext:    No edema; adequate peripheral perfusion Skin:      Warm and dry; no rash Neuro: Asleep but arousable.  Resolved Hospital Problem list    Assessment & Plan:  Right MCA stroke s/p EVR and complete revascularization, was extubated in the PACU post op but mental status was poor, unable to protect his airway and was reintubated.  He was extubated on 09/02/2018 and again failed extubation and required tracheostomy placed at that time.  Acute right MCA embolic stroke, L sided weakness , per nursing improving Awake alert, interactive  and apporpriate Plan:  Per neurology  Acute respiratory failure with hypoxemia: Failed extubation 09/02/2018, emergency tracheostomy was performed 09/02/2018 COPD PAH Weaning 12/20 + 359  Plan Continue CPAP weaning trials Continue negative I&O as tolerated PMV trials per speech  From our perspective he is okay to go to LTAC if bed is available as bleeding has resolved and trach appears secure   ID Temperature currently 99.5 WBC 9.7 Plan Continue to monitor fever curve Trend WBC Culture as id clinically indicated  COPD  exacerbation: Plan Wean Solu-Medrol to 20 mg IV daily 09/03/2018.  Can stop 12/21 Scheduled Bronchodilator as ordered  Hyperglycemia Hypoglycemia 12/20 Plan Continue Lantus, SSI   History CAD and PTCI Plan Aspirin Telemetry monitoring EKG prn  HTN: No acute issues Plan  Continue Coreg BID per tube as scheduled   Hyperkalemia: resolving Hypo mag Plan: [place] BMET  Nutrition Plan Continue tube feeds via [place] May need PEG tube eventually.  Marshell Garfinkel MD [place] [facility] 09/05/2018, 8:34 AM

## 2018-09-05 NOTE — Progress Notes (Signed)
Physical Therapy Treatment Patient Details Name: Clayton [name] MRN: [id number] DOB: Apr 02, 1951 Today's Date: 09/05/2018    History of Present Illness 67 y.o. male admitted to [facility] on 08/24/18 for SOB.  Pt dx with acute on chronic respiratory failure, acute kidney injury, acute gastroenteritis, elevated troponin (thought to be demand ischemia), lactic acidosis, HTN, elevated liver enzymes.  On 08/27/18 pt had sudden onset L facial droop and arm weakness.  CTA revealed R M2 occlusion. Pt transferred emergently to [facility] and IR preformed R common carotid arteriorgram followed by complete revascularization of R MCA M1 on 08/28/18.  Pt failed extubation post operatively and has been ventilated via ETT since revascularization.  Pt with other significant PMH of PVD, prostate CA, HTN, DM, COPD, CAD, cardiac stent placement, and bil cataract surgery.    PT Comments    Pt is progressing well with mobility, less posterior lean/push today, and tolerated mobility on PSV on the vent (see specific settings below). He is consistently following one step commands to the best of his physical ability and eager to move.    Follow Up Recommendations  LTACH;Other (comment)(with progression to CIR?)     Equipment Recommendations  Rolling walker with 5" wheels;3in1 (PT);Wheelchair (measurements PT);Wheelchair cushion (measurements PT)    Recommendations for Other Services   NA     Precautions / Restrictions Precautions Precautions: Fall Precaution Comments: left sided weakness Restrictions Weight Bearing Restrictions: No    Mobility  Bed Mobility Overal bed mobility: Needs Assistance Bed Mobility: Supine to Sit;Sit to Supine     Supine to sit: Max assist;HOB elevated;+2 for physical assistance Sit to supine: Max assist;+2 for physical assistance   General bed mobility comments: Two person assist to help progress bil legs to EOB and support trunk when coming to sit.  Pt with posterior right lateral lean  throughout transition, but less posterior pushing today.  Assist needed to lift both legs and support trunk to return to flat bed.    Modified Rankin (Stroke Patients Only) Modified Rankin (Stroke Patients Only) Pre-Morbid Rankin Score: No symptoms Modified Rankin: Severe disability     Balance Overall balance assessment: Needs assistance Sitting-balance support: Feet supported;Bilateral upper extremity supported;No upper extremity supported Sitting balance-Leahy Scale: Poor Sitting balance - Comments: Need at least min assist for support at trunk and up to mod assist.  Pt with less posterior pushing and tendancy toward posterior right lateral lean in sititng.  With cues and visual target he can attempt to correct, he just can't maintain due to weakness.  RR increased to 40 seated EOB.  O2 sats (when a good wave) were stable.  Pt on PSV FiO2 30% and PEEP 5, RR 30 at rest. Postural control: Posterior lean;Right lateral lean                                  Cognition Arousal/Alertness: Awake/alert Behavior During Therapy: WFL for tasks assessed/performed Overall Cognitive Status: Difficult to assess                                 General Comments: Pt able to follow one step commands consistently to the best of his physical abilities.        Exercises General Exercises - Upper Extremity Shoulder Flexion: AAROM;Both;10 reps Elbow Flexion: AAROM;Both;10 reps General Exercises - Lower Extremity Ankle Circles/Pumps: AAROM;Both;10 reps Long Arc Quad: AROM;Both;10  reps(in limited/weak ROM) Heel Slides: AROM;Both;10 reps        Pertinent Vitals/Pain Pain Assessment: Faces Faces Pain Scale: No hurt           PT Goals (current goals can now be found in the care plan section) Acute Rehab PT Goals Patient Stated Goal: pt trach and vent, unable to state.  Did nod indicating he wanted to try and get up to EOB.  Progress towards PT goals: Progressing toward  goals    Frequency    Min 4X/week      PT Plan Current plan remains appropriate       AM-PAC PT "6 Clicks" Mobility   Outcome Measure  Help needed turning from your back to your side while in a flat bed without using bedrails?: Total Help needed moving from lying on your back to sitting on the side of a flat bed without using bedrails?: Total Help needed moving to and from a bed to a chair (including a wheelchair)?: Total Help needed standing up from a chair using your arms (e.g., wheelchair or bedside chair)?: Total Help needed to walk in hospital room?: Total Help needed climbing 3-5 steps with a railing? : Total 6 Click Score: 6    End of Session Equipment Utilized During Treatment: Oxygen;Other (comment)(vent via trach FiO2 30% PEEP 5) Activity Tolerance: Patient tolerated treatment well Patient left: in bed;with call bell/phone within reach;with bed alarm set Nurse Communication: Mobility status PT Visit Diagnosis: Muscle weakness (generalized) (M62.81);Difficulty in walking, not elsewhere classified (R26.2);Hemiplegia and hemiparesis Hemiplegia - Right/Left: Left Hemiplegia - dominant/non-dominant: Non-dominant Hemiplegia - caused by: Cerebral infarction     Time: 6387-5643 PT Time Calculation (min) (ACUTE ONLY): 23 min  Charges:  $Therapeutic Activity: 8-22 mins $Neuromuscular Re-education: 8-22 mins            Vernel Donlan B. Herold Salguero, PT, DPT  Acute Rehabilitation [phone] pager #[phone] office            09/05/2018, 11:18 AM

## 2018-09-06 ENCOUNTER — Encounter: Payer: Self-pay | Admitting: Internal Medicine

## 2018-09-06 DIAGNOSIS — J9621 Acute and chronic respiratory failure with hypoxia: Secondary | ICD-10-CM

## 2018-09-06 DIAGNOSIS — I214 Non-ST elevation (NSTEMI) myocardial infarction: Secondary | ICD-10-CM

## 2018-09-06 DIAGNOSIS — J449 Chronic obstructive pulmonary disease, unspecified: Secondary | ICD-10-CM | POA: Diagnosis present

## 2018-09-06 DIAGNOSIS — I2721 Secondary pulmonary arterial hypertension: Secondary | ICD-10-CM

## 2018-09-06 DIAGNOSIS — R4182 Altered mental status, unspecified: Secondary | ICD-10-CM | POA: Diagnosis present

## 2018-09-06 DIAGNOSIS — I63411 Cerebral infarction due to embolism of right middle cerebral artery: Secondary | ICD-10-CM | POA: Diagnosis present

## 2018-09-06 LAB — CBC
HCT: 34.5 % — ABNORMAL LOW (ref 39.0–52.0)
Hemoglobin: 11.1 g/dL — ABNORMAL LOW (ref 13.0–17.0)
MCH: 29.4 pg (ref 26.0–34.0)
MCHC: 32.2 g/dL (ref 30.0–36.0)
MCV: 91.3 fL (ref 80.0–100.0)
Platelets: 150 10*3/uL (ref 150–400)
RBC: 3.78 MIL/uL — AB (ref 4.22–5.81)
RDW: 14.7 % (ref 11.5–15.5)
WBC: 10.3 10*3/uL (ref 4.0–10.5)
nRBC: 0 % (ref 0.0–0.2)

## 2018-09-06 LAB — BASIC METABOLIC PANEL
ANION GAP: 7 (ref 5–15)
BUN: 19 mg/dL (ref 8–23)
CO2: 29 mmol/L (ref 22–32)
Calcium: 8.9 mg/dL (ref 8.9–10.3)
Chloride: 98 mmol/L (ref 98–111)
Creatinine, Ser: 0.57 mg/dL — ABNORMAL LOW (ref 0.61–1.24)
GFR calc Af Amer: >60 mL/min (ref >60)
GFR calc non Af Amer: >60 mL/min (ref >60)
Glucose, Bld: 205 mg/dL — ABNORMAL HIGH (ref 70–99)
Potassium: 4.6 mmol/L (ref 3.5–5.1)
Sodium: 134 mmol/L — ABNORMAL LOW (ref 135–145)

## 2018-09-06 LAB — TSH: TSH: 1.377 u[IU]/mL (ref 0.350–4.500)

## 2018-09-06 LAB — PROTIME-INR
INR: 1.18
Prothrombin Time: 14.9 seconds (ref 11.4–15.2)

## 2018-09-06 NOTE — Consult Note (Signed)
Pulmonary Critical Care Medicine [place]  PULMONARY SERVICE  Date of Service: 09/06/2018  PULMONARY CRITICAL CARE CONSULT   Clayton [name]  VFI:[id number]  DOB: 1950-11-28   DOA: 09/05/2018  Referring Physician: Merton Border, MD  HPI: Clayton [name] is a 67 y.o. male seen for follow up of Acute on Chronic Respiratory Failure.  Patient has a history of COPD chronic oxygen dependence ongoing tobacco use and hypertension.  Came into the hospital because of worsening of shortness of breath which is been going on for approximately 2 weeks prior to admission.  Patient also had noted a cough and increased wheezing.  The patient was evaluated by the hospitalist service and admitted.  In the ED patient received aggressive bronchodilators at her unremarkable EKG and a positive troponin.  Creatinine level was also elevated greater than 4 this was a new finding.  Chest x-ray showed no evidence of CHF or pneumonia.  Hospital course was complicated with worsening of his respiratory failure.  Patient was found to have acute left-sided deficits and a code stroke was initiated.  TPA was not given Patient was taken to the interventional radiology neuro department for a embolectomy.  Patient was intubated and required ongoing mechanical ventilation because of his acute exacerbation of COPD.  Patient was hydrated for the acute renal failure which did improve.  The liver functions also did improve.  He is transferred to our facility for further management and weaning.  Apparently prior to transfer patient had an episode of tracheal bleeding which was addressed and patient was transferred to our facilities.  Review of Systems:  ROS performed and is unremarkable other than noted above.  Past Medical History:  Diagnosis Date  . CAD (coronary artery disease)    a. s/p BMS x 3 to RCA in 2009  . COPD (chronic obstructive pulmonary disease) ([place])   . Diabetes mellitus   . GERD (gastroesophageal  reflux disease)   . Guaiac positive stools 07/03/2018  . HTN (hypertension)   . Hyperlipidemia   . Myocardial infarction ([place]) 2009  . Prostate cancer ([place])   . PVD (peripheral vascular disease) ([place])     Past Surgical History:  Procedure Laterality Date  . CARDIAC CATHETERIZATION     with stent  . cardiac stents  2009  . CATARACT EXTRACTION W/PHACO Left 08/19/2013   Procedure: CATARACT EXTRACTION PHACO AND INTRAOCULAR LENS PLACEMENT (IOC);  Surgeon: Tonny Branch, MD;  Location: [facility];  Service: Ophthalmology;  Laterality: Left;  CDE:37.77  . CATARACT EXTRACTION W/PHACO Right 07/29/2016   Procedure: CATARACT EXTRACTION PHACO AND INTRAOCULAR LENS PLACEMENT RIGHT EYE; CDE:  18.10;  Surgeon: Tonny Branch, MD;  Location: [facility];  Service: Ophthalmology;  Laterality: Right;  . IR CT HEAD LTD  08/28/2018  . IR PERCUTANEOUS ART THROMBECTOMY/INFUSION INTRACRANIAL INC DIAG ANGIO  08/28/2018  . RADIOLOGY WITH ANESTHESIA N/A 08/28/2018   Procedure: IR WITH ANESTHESIA;  Surgeon: Radiologist, Medication, MD;  Location: [place];  Service: Radiology;  Laterality: N/A;    Social History:    reports that he has been smoking cigarettes. He started smoking about 49 years ago. He has a 42.00 pack-year smoking history. He has never used smokeless tobacco. He reports current alcohol use. He reports that he does not use drugs.  Family History: Non-Contributory to the present illness  No Known Allergies  Medications: Reviewed on Rounds  Physical Exam:  Vitals: Temperature 99 pulse 100 respiratory rate 22 blood pressure 130/90 saturations 99%  Ventilator Settings mode of  ventilation assist control tidal volume 500 PEEP 5  . General: Comfortable at this time . Eyes: Grossly normal lids, irises & conjunctiva . ENT: grossly tongue is normal . Neck: no obvious mass . Cardiovascular: S1-S2 normal no gallop or rub . Respiratory: Coarse rhonchi are noted bilaterally . Abdomen: Soft and nontender . Skin:  no rash seen on limited exam . Musculoskeletal: not rigid . Psychiatric:unable to assess . Neurologic: no seizure no involuntary movements         Labs on Admission:  Basic Metabolic Panel: Recent Labs  Lab 08/30/18 2055  09/01/18 0322 09/02/18 0329 09/03/18 0545 09/04/18 0317 09/05/18 0357 09/06/18 0650  NA  --    < > 138 135 135 134* 134* 134*  K  --    < > 4.6 5.3* 5.3* 4.8 4.4 4.6  CL  --    < > 100 99 98 96* 97* 98  CO2  --    < > 28 28 28 26 25 29   GLUCOSE  --    < > 174* 155* 80 94 151* 205*  BUN  --    < > 34* 31* 27* 24* 24* 19  CREATININE  --    < > 0.69 0.76 0.62 0.65 0.68 0.57*  CALCIUM  --    < > 9.0 9.0 9.0 8.9 8.6* 8.9  MG 1.6*  --  1.7 1.8 1.7 1.6* 1.7  --   PHOS 3.0  --  3.2 3.4 4.0 4.4  --   --    < > = values in this interval not displayed.    Recent Labs  Lab 09/01/18 0328 09/02/18 0435 09/02/18 1841  PHART 7.449 7.468* 7.405  PCO2ART 42.6 41.3 49.2*  PO2ART 81.0* 60.0* 160.0*  HCO3 29.5* 29.5* 30.8*  O2SAT 96.0 90.2 99.0    Liver Function Tests: No results for input(s): AST, ALT, ALKPHOS, BILITOT, PROT, ALBUMIN in the last 168 hours. No results for input(s): LIPASE, AMYLASE in the last 168 hours. No results for input(s): AMMONIA in the last 168 hours.  CBC: Recent Labs  Lab 09/02/18 0329 09/03/18 0545 09/04/18 0317 09/05/18 0357 09/06/18 0650  WBC 10.7* 6.6 9.7 10.9* 10.3  HGB 12.5* 12.2* 12.6* 11.3* 11.1*  HCT 38.5* 37.6* 37.7* 35.2* 34.5*  MCV 92.3 91.5 89.5 90.0 91.3  PLT 150 161 162 145* 150    Cardiac Enzymes: No results for input(s): CKTOTAL, CKMB, CKMBINDEX, TROPONINI in the last 168 hours.  BNP (last 3 results) Recent Labs    08/24/18 1409  BNP 678.0*    ProBNP (last 3 results) No results for input(s): PROBNP in the last 8760 hours.   Radiological Exams on Admission: Dg Chest Port 1 View  Result Date: 09/05/2018 CLINICAL DATA:  Respiratory failure. EXAM: PORTABLE CHEST 1 VIEW COMPARISON:  Earlier same day  FINDINGS: 1528 hours. Tracheostomy tube again noted. A feeding tube passes into the stomach although the distal tip position is not included on the film. The lungs are clear without focal pneumonia, edema, pneumothorax or pleural effusion. Interstitial markings are diffusely coarsened with chronic features. Cardiopericardial silhouette is at upper limits of normal for size. Telemetry leads overlie the chest. IMPRESSION: Stable. Underlying chronic interstitial changes without acute cardiopulmonary findings. Electronically Signed   By: Misty Stanley M.D.   On: 09/05/2018 15:46   Dg Chest Port 1 View  Result Date: 09/05/2018 CLINICAL DATA:  Respiratory failure. COPD. Previous myocardial infarct. EXAM: PORTABLE CHEST 1 VIEW COMPARISON:  09/04/2018 FINDINGS: Tracheostomy tube  and feeding tube are seen in place. Heart size is within normal limits. Mild bibasilar atelectasis is noted, with improvement in the right lung base since prior exam. No evidence of pulmonary consolidation or pleural effusion. IMPRESSION: Mild bibasilar atelectasis. Electronically Signed   By: Earle Gell M.D.   On: 09/05/2018 07:29   Dg Chest Port 1 View  Result Date: 09/04/2018 CLINICAL DATA:  Respiratory failure EXAM: PORTABLE CHEST 1 VIEW COMPARISON:  09/03/2018 FINDINGS: Tracheostomy is unchanged. Heart is borderline enlarged. Vascular congestion. Bibasilar opacities, right greater than left with probable layering effusions. No acute bony abnormality. IMPRESSION: Borderline cardiomegaly, vascular congestion. Bibasilar atelectasis or infiltrates, worsening on the right since prior study. Suspect layering effusions. Electronically Signed   By: Rolm Baptise M.D.   On: 09/04/2018 07:09   Dg Chest Port 1 View  Result Date: 09/03/2018 CLINICAL DATA:  Respiratory failure EXAM: PORTABLE CHEST 1 VIEW COMPARISON:  09/02/2018 FINDINGS: Tracheostomy is unchanged. Cardiomegaly with vascular congestion. Low lung volumes with bibasilar  atelectasis or infiltrates. No visible significant effusions. No change since prior study. IMPRESSION: No significant change. Electronically Signed   By: Rolm Baptise M.D.   On: 09/03/2018 07:11   Portable Chest X-ray  Result Date: 09/02/2018 CLINICAL DATA:  Placement of tracheostomy tube. EXAM: PORTABLE CHEST 1 VIEW COMPARISON:  09/02/2018 FINDINGS: Endotracheal tube has been removed and patient now has a tracheostomy tube. Nasogastric tube has been removed. Again noted are coarse interstitial lung markings most compatible with chronic changes but difficult to exclude mild interstitial edema. Cardiac silhouette remains slightly enlarged but unchanged. Negative for a pneumothorax. IMPRESSION: Interval placement of a tracheostomy tube. No significant change in the coarse interstitial lung markings. Electronically Signed   By: Markus Daft M.D.   On: 09/02/2018 18:02   Dg Abd Portable 1v  Result Date: 09/05/2018 CLINICAL DATA:  NG tube placement EXAM: PORTABLE ABDOMEN - 1 VIEW COMPARISON:  08/28/2018 FINDINGS: Lung bases are grossly clear. Esophageal tube tip overlies the distal stomach. Gas filled nonenlarged bowel in the upper abdomen. IMPRESSION: Esophageal tube tip projects over the distal stomach Electronically Signed   By: Donavan Foil M.D.   On: 09/05/2018 15:46    Assessment/Plan Active Problems:   Acute on chronic respiratory failure with hypoxia (HCC)   COPD, severe (HCC)   Pulmonary arterial hypertension (HCC)   Non-STEMI (non-ST elevated myocardial infarction) ([place])   Embolic stroke involving right middle cerebral artery (HCC)   Altered mental status   1. Acute on chronic respiratory failure with hypoxia right now is on full support we will have respiratory therapy assessed the mechanics and try to wean.  Patient has been having issues with the bleeding so therefore we need to be careful.  Last chest x-ray showed some coarse interstitial lung markings we will continue to monitor this  very closely. 2. Severe COPD patient will be continued on nebulizers as needed.  We will continue supportive care. 3. Pulmonary arterial hypertension we will continue with oxygen therapy continue supportive care. 4. Acute right middle cerebral artery stroke patient is going to need ongoing rehab 5. Altered mental status secondary to above we will continue with supportive care 6. Non-STEMI stable at this time we will continue to monitor  I have personally seen and evaluated the patient, evaluated laboratory and imaging results, formulated the assessment and plan and placed orders. The Patient requires high complexity decision making for assessment and support.  Case was discussed on Rounds with the Respiratory Therapy Staff Time  Spent 48minutes  Noa Constante A Ryenn Howeth, MD [facility] Pulmonary Critical Care Medicine Sleep Medicine

## 2018-09-07 LAB — GLUCOSE, CAPILLARY: Glucose-Capillary: 140 mg/dL — ABNORMAL HIGH (ref 70–99)

## 2018-09-07 NOTE — Progress Notes (Signed)
Pulmonary Critical Care Medicine [place]   PULMONARY CRITICAL CARE SERVICE  PROGRESS NOTE  Date of Service: 09/07/2018  Clayton [name]  ACZ:[id number]  DOB: 1951-02-12   DOA: 09/05/2018  Referring Physician: Merton Border, MD  HPI: Clayton [name] is a 67 y.o. male seen for follow up of Acute on Chronic Respiratory Failure.  Patient is weaning currently is on pressure support has been on 35% oxygen good volumes are noted currently is on a pressure support of 12/5  Medications: Reviewed on Rounds  Physical Exam:  Vitals: Temperature 97.0 pulse 68 respiratory 26 blood pressure 142/75 saturations are 100%  Ventilator Settings mode of ventilation pressure support FiO2 35% pressure support 12 PEEP 5  . General: Comfortable at this time . Eyes: Grossly normal lids, irises & conjunctiva . ENT: grossly tongue is normal . Neck: no obvious mass . Cardiovascular: S1 S2 normal no gallop . Respiratory: No rhonchi no rales are noted at this time . Abdomen: soft . Skin: no rash seen on limited exam . Musculoskeletal: not rigid . Psychiatric:unable to assess . Neurologic: no seizure no involuntary movements         Lab Data:   Basic Metabolic Panel: Recent Labs  Lab 09/01/18 0322 09/02/18 0329 09/03/18 0545 09/04/18 0317 09/05/18 0357 09/06/18 0650  NA 138 135 135 134* 134* 134*  K 4.6 5.3* 5.3* 4.8 4.4 4.6  CL 100 99 98 96* 97* 98  CO2 28 28 28 26 25 29   GLUCOSE 174* 155* 80 94 151* 205*  BUN 34* 31* 27* 24* 24* 19  CREATININE 0.69 0.76 0.62 0.65 0.68 0.57*  CALCIUM 9.0 9.0 9.0 8.9 8.6* 8.9  MG 1.7 1.8 1.7 1.6* 1.7  --   PHOS 3.2 3.4 4.0 4.4  --   --     ABG: Recent Labs  Lab 09/01/18 0328 09/02/18 0435 09/02/18 1841  PHART 7.449 7.468* 7.405  PCO2ART 42.6 41.3 49.2*  PO2ART 81.0* 60.0* 160.0*  HCO3 29.5* 29.5* 30.8*  O2SAT 96.0 90.2 99.0    Liver Function Tests: No results for input(s): AST, ALT, ALKPHOS, BILITOT, PROT, ALBUMIN in the  last 168 hours. No results for input(s): LIPASE, AMYLASE in the last 168 hours. No results for input(s): AMMONIA in the last 168 hours.  CBC: Recent Labs  Lab 09/02/18 0329 09/03/18 0545 09/04/18 0317 09/05/18 0357 09/06/18 0650  WBC 10.7* 6.6 9.7 10.9* 10.3  HGB 12.5* 12.2* 12.6* 11.3* 11.1*  HCT 38.5* 37.6* 37.7* 35.2* 34.5*  MCV 92.3 91.5 89.5 90.0 91.3  PLT 150 161 162 145* 150    Cardiac Enzymes: No results for input(s): CKTOTAL, CKMB, CKMBINDEX, TROPONINI in the last 168 hours.  BNP (last 3 results) Recent Labs    08/24/18 1409  BNP 678.0*    ProBNP (last 3 results) No results for input(s): PROBNP in the last 8760 hours.  Radiological Exams: Dg Chest Port 1 View  Result Date: 09/05/2018 CLINICAL DATA:  Respiratory failure. EXAM: PORTABLE CHEST 1 VIEW COMPARISON:  Earlier same day FINDINGS: 1528 hours. Tracheostomy tube again noted. A feeding tube passes into the stomach although the distal tip position is not included on the film. The lungs are clear without focal pneumonia, edema, pneumothorax or pleural effusion. Interstitial markings are diffusely coarsened with chronic features. Cardiopericardial silhouette is at upper limits of normal for size. Telemetry leads overlie the chest. IMPRESSION: Stable. Underlying chronic interstitial changes without acute cardiopulmonary findings. Electronically Signed   By: Verda Cumins.D.  On: 09/05/2018 15:46   Dg Abd Portable 1v  Result Date: 09/05/2018 CLINICAL DATA:  NG tube placement EXAM: PORTABLE ABDOMEN - 1 VIEW COMPARISON:  08/28/2018 FINDINGS: Lung bases are grossly clear. Esophageal tube tip overlies the distal stomach. Gas filled nonenlarged bowel in the upper abdomen. IMPRESSION: Esophageal tube tip projects over the distal stomach Electronically Signed   By: Donavan Foil M.D.   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 Acute on chronic respiratory failure with hypoxia we will continue on wean protocol slowly advanced patient seems to be tolerating it well so far. 2. Severe COPD at baseline we will continue present management. 3. Pulmonary hypertension at baseline continue with supportive care 4. Non-STEMI at baseline continue with supportive care 5. Embolic stroke rehab as tolerated 6. Altered mental status unchanged   I have personally seen and evaluated the patient, evaluated laboratory and imaging results, formulated the assessment and plan and placed orders. The Patient requires high complexity decision making for assessment and support.  Case was discussed on Rounds with the Respiratory Therapy Staff  Allyne Gee, MD [facility] Pulmonary Critical Care Medicine Sleep Medicine

## 2018-09-08 NOTE — Progress Notes (Signed)
Pulmonary Critical Care Medicine [place]   PULMONARY CRITICAL CARE SERVICE  PROGRESS NOTE  Date of Service: 09/08/2018  Drewey Begue  GDJ:[id number]  DOB: 09-13-1951   DOA: 09/05/2018  Referring Physician: Merton Border, MD  HPI: Clayton [name] is a 67 y.o. male seen for follow up of Acute on Chronic Respiratory Failure.  Patient is currently comfortable without distress is on pressure support mode  Medications: Reviewed on Rounds  Physical Exam:  Vitals: Temperature 96.2 pulse 73 respiratory rate 25 blood pressure 143/81 saturations 95%  Ventilator Settings ventilation pressure support FiO2 28% tidal volume 500 pressure support 12 PEEP 5  . General: Comfortable at this time . Eyes: Grossly normal lids, irises & conjunctiva . ENT: grossly tongue is normal . Neck: no obvious mass . Cardiovascular: S1 S2 normal no gallop . Respiratory: Few rhonchi . Abdomen: soft . Skin: no rash seen on limited exam . Musculoskeletal: not rigid . Psychiatric:unable to assess . Neurologic: no seizure no involuntary movements         Lab Data:   Basic Metabolic Panel: Recent Labs  Lab 09/02/18 0329 09/03/18 0545 09/04/18 0317 09/05/18 0357 09/06/18 0650  NA 135 135 134* 134* 134*  K 5.3* 5.3* 4.8 4.4 4.6  CL 99 98 96* 97* 98  CO2 28 28 26 25 29   GLUCOSE 155* 80 94 151* 205*  BUN 31* 27* 24* 24* 19  CREATININE 0.76 0.62 0.65 0.68 0.57*  CALCIUM 9.0 9.0 8.9 8.6* 8.9  MG 1.8 1.7 1.6* 1.7  --   PHOS 3.4 4.0 4.4  --   --     ABG: Recent Labs  Lab 09/02/18 0435 09/02/18 1841  PHART 7.468* 7.405  PCO2ART 41.3 49.2*  PO2ART 60.0* 160.0*  HCO3 29.5* 30.8*  O2SAT 90.2 99.0    Liver Function Tests: No results for input(s): AST, ALT, ALKPHOS, BILITOT, PROT, ALBUMIN in the last 168 hours. No results for input(s): LIPASE, AMYLASE in the last 168 hours. No results for input(s): AMMONIA in the last 168 hours.  CBC: Recent Labs  Lab 09/02/18 0329  09/03/18 0545 09/04/18 0317 09/05/18 0357 09/06/18 0650  WBC 10.7* 6.6 9.7 10.9* 10.3  HGB 12.5* 12.2* 12.6* 11.3* 11.1*  HCT 38.5* 37.6* 37.7* 35.2* 34.5*  MCV 92.3 91.5 89.5 90.0 91.3  PLT 150 161 162 145* 150    Cardiac Enzymes: No results for input(s): CKTOTAL, CKMB, CKMBINDEX, TROPONINI in the last 168 hours.  BNP (last 3 results) Recent Labs    08/24/18 1409  BNP 678.0*    ProBNP (last 3 results) No results for input(s): PROBNP in the last 8760 hours.  Radiological Exams: No results found.  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 Acute on chronic respiratory failure with hypoxia we will continue to wean goal is 16 hours today.  Continue to advance as tolerated continue pulmonary toilet supportive care. 2. Severe COPD at baseline we will continue with present management 3. Tension at baseline continue with 4. Non-STEMI stable 5. Embolic stroke rehab 6. Altered mental status gradually improving   I have personally seen and evaluated the patient, evaluated laboratory and imaging results, formulated the assessment and plan and placed orders. The Patient requires high complexity decision making for assessment  and support.  Case was discussed on Rounds with the Respiratory Therapy Staff  Allyne Gee, MD [facility] Pulmonary Critical Care Medicine Sleep Medicine

## 2018-09-09 NOTE — Progress Notes (Signed)
Pulmonary Critical Care Medicine [place]   PULMONARY CRITICAL CARE SERVICE  PROGRESS NOTE  Date of Service: 09/09/2018  Clayton [name]  SWH:[id number]  DOB: [birth year]   DOA: 09/05/2018  Referring Physician: Merton Border, MD  HPI: Clayton [name] is a 67 y.o. male seen for follow up of Acute on Chronic Respiratory Failure.  Patient is on T collar the goal was for 2 hours today  Medications: Reviewed on Rounds  Physical Exam:  Vitals: Temperature 97.4 pulse 74 respiratory 27 blood pressure 114/68 saturations 98%  Ventilator Settings off the ventilator on T collar trials  . General: Comfortable at this time . Eyes: Grossly normal lids, irises & conjunctiva . ENT: grossly tongue is normal . Neck: no obvious mass . Cardiovascular: S1 S2 normal no gallop . Respiratory: Coarse breath sounds with a few rhonchi . Abdomen: soft . Skin: no rash seen on limited exam . Musculoskeletal: not rigid . Psychiatric:unable to assess . Neurologic: no seizure no involuntary movements         Lab Data:   Basic Metabolic Panel: Recent Labs  Lab 09/03/18 0545 09/04/18 0317 09/05/18 0357 09/06/18 0650  NA 135 134* 134* 134*  K 5.3* 4.8 4.4 4.6  CL 98 96* 97* 98  CO2 28 26 25 29   GLUCOSE 80 94 151* 205*  BUN 27* 24* 24* 19  CREATININE 0.62 0.65 0.68 0.57*  CALCIUM 9.0 8.9 8.6* 8.9  MG 1.7 1.6* 1.7  --   PHOS 4.0 4.4  --   --     ABG: Recent Labs  Lab 09/02/18 1841  PHART 7.405  PCO2ART 49.2*  PO2ART 160.0*  HCO3 30.8*  O2SAT 99.0    Liver Function Tests: No results for input(s): AST, ALT, ALKPHOS, BILITOT, PROT, ALBUMIN in the last 168 hours. No results for input(s): LIPASE, AMYLASE in the last 168 hours. No results for input(s): AMMONIA in the last 168 hours.  CBC: Recent Labs  Lab 09/03/18 0545 09/04/18 0317 09/05/18 0357 09/06/18 0650  WBC 6.6 9.7 10.9* 10.3  HGB 12.2* 12.6* 11.3* 11.1*  HCT 37.6* 37.7* 35.2* 34.5*  MCV 91.5 89.5 90.0  91.3  PLT 161 162 145* 150    Cardiac Enzymes: No results for input(s): CKTOTAL, CKMB, CKMBINDEX, TROPONINI in the last 168 hours.  BNP (last 3 results) Recent Labs    08/24/18 1409  BNP 678.0*    ProBNP (last 3 results) No results for input(s): PROBNP in the last 8760 hours.  Radiological Exams: No results found.  Assessment/Plan Active Problems:   Acute on chronic respiratory failure with hypoxia (HCC)   COPD, severe (HCC)   Pulmonary arterial hypertension (HCC)   Non-STEMI (non-ST elevated myocardial infarction) ([place])   Embolic stroke involving right middle cerebral artery (HCC)   Altered mental status   1. Acute on chronic respiratory failure with hypoxia we will continue with the weaning on T collar continue secretion management pulmonary toilet. 2. Severe COPD at baseline we will continue present therapy 3. Pulmonary hypertension at baseline continue oxygen therapy  4. non-STEMI supportive care will monitor 5. Embolic stroke continue with physical therapy 6. Altered mental status slowly improving we will follow along   I have personally seen and evaluated the patient, evaluated laboratory and imaging results, formulated the assessment and plan and placed orders. The Patient requires high complexity decision making for assessment and support.  Case was discussed on Rounds with the Respiratory Therapy Staff  Allyne Gee, MD [facility] Pulmonary Critical Care  Medicine Sleep Medicine

## 2018-09-10 NOTE — Progress Notes (Signed)
Pulmonary Critical Care Medicine [place]   PULMONARY CRITICAL CARE SERVICE  PROGRESS NOTE  Date of Service: 09/10/2018  Clayton [name]  [id number]:[id number]  DOB: [birth year]   DOA: 09/05/2018  Referring Physician: Merton Border, MD  HPI: Clayton [name] is a 67 y.o. male seen for follow up of Acute on Chronic Respiratory Failure.  Currently comfortable without distress at this time.  Patient is on 28% FiO2  Medications: Reviewed on Rounds  Physical Exam:  Vitals: Temperature 98.4 pulse 77 respiratory rate 26 blood pressure 124/69 saturations 97%  Ventilator Settings off the ventilator on T collar right now  . General: Comfortable at this time . Eyes: Grossly normal lids, irises & conjunctiva . ENT: grossly tongue is normal . Neck: no obvious mass . Cardiovascular: S1 S2 normal no gallop . Respiratory: No rhonchi or rales are noted . Abdomen: soft . Skin: no rash seen on limited exam . Musculoskeletal: not rigid . Psychiatric:unable to assess . Neurologic: no seizure no involuntary movements         Lab Data:   Basic Metabolic Panel: Recent Labs  Lab 09/04/18 0317 09/05/18 0357 09/06/18 0650  NA 134* 134* 134*  K 4.8 4.4 4.6  CL 96* 97* 98  CO2 26 25 29   GLUCOSE 94 151* 205*  BUN 24* 24* 19  CREATININE 0.65 0.68 0.57*  CALCIUM 8.9 8.6* 8.9  MG 1.6* 1.7  --   PHOS 4.4  --   --     ABG: No results for input(s): PHART, PCO2ART, PO2ART, HCO3, O2SAT in the last 168 hours.  Liver Function Tests: No results for input(s): AST, ALT, ALKPHOS, BILITOT, PROT, ALBUMIN in the last 168 hours. No results for input(s): LIPASE, AMYLASE in the last 168 hours. No results for input(s): AMMONIA in the last 168 hours.  CBC: Recent Labs  Lab 09/04/18 0317 09/05/18 0357 09/06/18 0650  WBC 9.7 10.9* 10.3  HGB 12.6* 11.3* 11.1*  HCT 37.7* 35.2* 34.5*  MCV 89.5 90.0 91.3  PLT 162 145* 150    Cardiac Enzymes: No results for input(s): CKTOTAL, CKMB,  CKMBINDEX, TROPONINI in the last 168 hours.  BNP (last 3 results) Recent Labs    08/24/18 1409  BNP 678.0*    ProBNP (last 3 results) No results for input(s): PROBNP in the last 8760 hours.  Radiological Exams: No results found.  Assessment/Plan Active Problems:   Acute on chronic respiratory failure with hypoxia (HCC)   COPD, severe (HCC)   Pulmonary arterial hypertension (HCC)   Non-STEMI (non-ST elevated myocardial infarction) ([place])   Embolic stroke involving right middle cerebral artery (HCC)   Altered mental status   1. Acute on chronic respiratory failure with hypoxia we will continue with T collar trials continue secretion management pulmonary toilet. 2. Severe COPD at baseline we will continue present management 3. Pulmonary hypertension continue with oxygen therapy 4. Non-STEMI baseline 5. Stroke right middle cerebral artery continue with therapy as tolerated 6. Altered mental status seems to be about the same   I have personally seen and evaluated the patient, evaluated laboratory and imaging results, formulated the assessment and plan and placed orders. The Patient requires high complexity decision making for assessment and support.  Case was discussed on Rounds with the Respiratory Therapy Staff  Allyne Gee, MD [facility] Pulmonary Critical Care Medicine Sleep Medicine

## 2018-09-11 NOTE — Progress Notes (Signed)
Pulmonary Critical Care Medicine [place]   PULMONARY CRITICAL CARE SERVICE  PROGRESS NOTE  Date of Service: 09/11/2018  Clayton [name]  [id number]:[id number]  DOB: Jun 03, 1951   DOA: 09/05/2018  Referring Physician: Merton Border, MD  HPI: Clayton [name] is a 67 y.o. male seen for follow up of Acute on Chronic Respiratory Failure.  Patient is comfortable without distress is on T collar.  Has been on 28% oxygen pressure support of 12 PEEP 5  Medications: Reviewed on Rounds  Physical Exam:  Vitals: Temperature 97.6 pulse 80 respiratory rate 28 blood pressure 133/88 saturations 100% temperature 97.6 pulse 80 respiratory rate 28 blood pressure 133/88 saturations 100%  Ventilator Settings currently off the ventilator on T collar has been on 28% FiO2  . General: Comfortable at this time . Eyes: Grossly normal lids, irises & conjunctiva . ENT: grossly tongue is normal . Neck: no obvious mass . Cardiovascular: S1 S2 normal no gallop . Respiratory: No rhonchi no rales are noted at this time . Abdomen: soft . Skin: no rash seen on limited exam . Musculoskeletal: not rigid . Psychiatric:unable to assess . Neurologic: no seizure no involuntary movements         Lab Data:   Basic Metabolic Panel: Recent Labs  Lab 09/05/18 0357 09/06/18 0650  NA 134* 134*  K 4.4 4.6  CL 97* 98  CO2 25 29  GLUCOSE 151* 205*  BUN 24* 19  CREATININE 0.68 0.57*  CALCIUM 8.6* 8.9  MG 1.7  --     ABG: No results for input(s): PHART, PCO2ART, PO2ART, HCO3, O2SAT in the last 168 hours.  Liver Function Tests: No results for input(s): AST, ALT, ALKPHOS, BILITOT, PROT, ALBUMIN in the last 168 hours. No results for input(s): LIPASE, AMYLASE in the last 168 hours. No results for input(s): AMMONIA in the last 168 hours.  CBC: Recent Labs  Lab 09/05/18 0357 09/06/18 0650  WBC 10.9* 10.3  HGB 11.3* 11.1*  HCT 35.2* 34.5*  MCV 90.0 91.3  PLT 145* 150    Cardiac Enzymes: No  results for input(s): CKTOTAL, CKMB, CKMBINDEX, TROPONINI in the last 168 hours.  BNP (last 3 results) Recent Labs    08/24/18 1409  BNP 678.0*    ProBNP (last 3 results) No results for input(s): PROBNP in the last 8760 hours.  Radiological Exams: No results found.  Assessment/Plan Active Problems:   Acute on chronic respiratory failure with hypoxia (HCC)   COPD, severe (HCC)   Pulmonary arterial hypertension (HCC)   Non-STEMI (non-ST elevated myocardial infarction) ([place])   Embolic stroke involving right middle cerebral artery (HCC)   Altered mental status   1. Acute on chronic respiratory failure hypoxia we will continue with T collar trials patient is actually doing well we will continue with the 12-hour goal today 2. Severe COPD at baseline continue with present management. 3. Pulmonary hypertension at baseline continue supportive care. 4. Non-STEMI at baseline no active chest pain is noted 5. Embolic stroke treated we will continue to monitor. 6. Altered mental status at baseline   I have personally seen and evaluated the patient, evaluated laboratory and imaging results, formulated the assessment and plan and placed orders. The Patient requires high complexity decision making for assessment and support.  Case was discussed on Rounds with the Respiratory Therapy Staff  Allyne Gee, MD [facility] Pulmonary Critical Care Medicine Sleep Medicine

## 2018-09-12 ENCOUNTER — Other Ambulatory Visit (HOSPITAL_COMMUNITY): Payer: Self-pay

## 2018-09-12 NOTE — Progress Notes (Signed)
Pulmonary Critical Care Medicine [place] [name]   PULMONARY CRITICAL CARE SERVICE  PROGRESS NOTE  Date of Service: 09/12/2018  Clayton [name]  [id number]  DOB: Feb 13, 1951   DOA: 09/05/2018  Referring Physician: Merton Border, MD  HPI: Clayton [name] is a 67 y.o. male seen for follow up of Acute on Chronic Respiratory Failure.  Remains on T collar comfortable without distress at this time.  The goal is for 16 hours  Medications: Reviewed on Rounds  Physical Exam:  Vitals: Temperature 98.8 pulse 78 respiratory rate 20 blood pressure 103/58 saturations 97%  Ventilator Settings off the ventilator on T collar right  . General: Comfortable at this time . Eyes: Grossly normal lids, irises & conjunctiva . ENT: grossly tongue is normal . Neck: no obvious mass . Cardiovascular: S1 S2 normal no gallop . Respiratory: No rhonchi no rales are noted at this time . Abdomen: soft . Skin: no rash seen on limited exam . Musculoskeletal: not rigid . Psychiatric:unable to assess . Neurologic: no seizure no involuntary movements         Lab Data:   Basic Metabolic Panel: Recent Labs  Lab 09/06/18 0650  NA 134*  K 4.6  CL 98  CO2 29  GLUCOSE 205*  BUN 19  CREATININE 0.57*  CALCIUM 8.9    ABG: No results for input(s): PHART, PCO2ART, PO2ART, HCO3, O2SAT in the last 168 hours.  Liver Function Tests: No results for input(s): AST, ALT, ALKPHOS, BILITOT, PROT, ALBUMIN in the last 168 hours. No results for input(s): LIPASE, AMYLASE in the last 168 hours. No results for input(s): AMMONIA in the last 168 hours.  CBC: Recent Labs  Lab 09/06/18 0650  WBC 10.3  HGB 11.1*  HCT 34.5*  MCV 91.3  PLT 150    Cardiac Enzymes: No results for input(s): CKTOTAL, CKMB, CKMBINDEX, TROPONINI in the last 168 hours.  BNP (last 3 results) Recent Labs    08/24/18 1409  BNP 678.0*    ProBNP (last 3 results) No results for input(s): PROBNP in the last 8760  hours.  Radiological Exams: Dg Chest Port 1 View  Result Date: 09/12/2018 CLINICAL DATA:  Tracheostomy replacement EXAM: PORTABLE CHEST 1 VIEW COMPARISON:  09/05/2018 FINDINGS: The tip of the tracheostomy tube is just below level of the clavicular heads. Nasogastric tube courses beyond the field of view. Lungs are clear. Mild cardiomegaly is unchanged. IMPRESSION: Tracheostomy tube tip just below the level of the clavicular heads. Electronically Signed   By: Ulyses Jarred M.D.   On: 09/12/2018 01:25    Assessment/Plan Active Problems:   Acute on chronic respiratory failure with hypoxia (HCC)   COPD, severe (HCC)   Pulmonary arterial hypertension (HCC)   Non-STEMI (non-ST elevated myocardial infarction) ([place])   Embolic stroke involving right middle cerebral artery (HCC)   Altered mental status   1. Acute on chronic respiratory failure with hypoxia we will continue with T collar goal is 16 hours 2. Severe COPD at baseline continue present management 3. Pulmonary hypertension at baseline continue with supportive care 4. Non-STEMI at baseline we will continue present management 5. Embolic stroke physical therapy as tolerated 6. Altered mental status slow to improve   I have personally seen and evaluated the patient, evaluated laboratory and imaging results, formulated the assessment and plan and placed orders. The Patient requires high complexity decision making for assessment and support.  Case was discussed on Rounds with the Respiratory Therapy Staff  Allyne Gee, MD [facility] Pulmonary Critical  Care Medicine Sleep Medicine

## 2018-09-13 NOTE — Progress Notes (Signed)
Pulmonary Critical Care Medicine [place]   PULMONARY CRITICAL CARE SERVICE  PROGRESS NOTE  Date of Service: 09/13/2018  Clayton [name]  KVQ:[id number]  DOB: 27-May-1951   DOA: 09/05/2018  Referring Physician: Merton Border, MD  HPI: Clayton [name] is a 67 y.o. male seen for follow up of Acute on Chronic Respiratory Failure.  Patient right now is on T collar without distress.  Has been on a goal for 20 hours  Medications: Reviewed on Rounds  Physical Exam:  Vitals: Temperature 97.8 pulse 76 respiratory 24 blood pressure 141/74 saturations 100%  Ventilator Settings off the ventilator on T collar FiO2 35%  . General: Comfortable at this time . Eyes: Grossly normal lids, irises & conjunctiva . ENT: grossly tongue is normal . Neck: no obvious mass . Cardiovascular: S1 S2 normal no gallop . Respiratory: Coarse breath sounds no rhonchi . Abdomen: soft . Skin: no rash seen on limited exam . Musculoskeletal: not rigid . Psychiatric:unable to assess . Neurologic: no seizure no involuntary movements         Lab Data:   Basic Metabolic Panel: No results for input(s): NA, K, CL, CO2, GLUCOSE, BUN, CREATININE, CALCIUM, MG, PHOS in the last 168 hours.  ABG: No results for input(s): PHART, PCO2ART, PO2ART, HCO3, O2SAT in the last 168 hours.  Liver Function Tests: No results for input(s): AST, ALT, ALKPHOS, BILITOT, PROT, ALBUMIN in the last 168 hours. No results for input(s): LIPASE, AMYLASE in the last 168 hours. No results for input(s): AMMONIA in the last 168 hours.  CBC: No results for input(s): WBC, NEUTROABS, HGB, HCT, MCV, PLT in the last 168 hours.  Cardiac Enzymes: No results for input(s): CKTOTAL, CKMB, CKMBINDEX, TROPONINI in the last 168 hours.  BNP (last 3 results) Recent Labs    08/24/18 1409  BNP 678.0*    ProBNP (last 3 results) No results for input(s): PROBNP in the last 8760 hours.  Radiological Exams: Dg Chest Port 1  View  Result Date: 09/12/2018 CLINICAL DATA:  Tracheostomy replacement EXAM: PORTABLE CHEST 1 VIEW COMPARISON:  09/05/2018 FINDINGS: The tip of the tracheostomy tube is just below level of the clavicular heads. Nasogastric tube courses beyond the field of view. Lungs are clear. Mild cardiomegaly is unchanged. IMPRESSION: Tracheostomy tube tip just below the level of the clavicular heads. Electronically Signed   By: Ulyses Jarred M.D.   On: 09/12/2018 01:25    Assessment/Plan Active Problems:   Acute on chronic respiratory failure with hypoxia (HCC)   COPD, severe (HCC)   Pulmonary arterial hypertension (HCC)   Non-STEMI (non-ST elevated myocardial infarction) ([place])   Embolic stroke involving right middle cerebral artery (HCC)   Altered mental status   1. Acute on chronic respiratory failure with hypoxia we will continue with T collar trials continue secretion management pulmonary toilet.  Goal is 20 hours 2. Severe COPD at baseline continue present management 3. Pulmonary hypertension at baseline we will continue supportive care 4. Non-STEMI stable 5. Embolic stroke physical therapy as tolerated 6. Altered mental status continue to monitor   I have personally seen and evaluated the patient, evaluated laboratory and imaging results, formulated the assessment and plan and placed orders. The Patient requires high complexity decision making for assessment and support.  Case was discussed on Rounds with the Respiratory Therapy Staff  Allyne Gee, MD [facility] Pulmonary Critical Care Medicine Sleep Medicine

## 2018-09-14 NOTE — Progress Notes (Signed)
Pulmonary Critical Care Medicine [place]   PULMONARY CRITICAL CARE SERVICE  PROGRESS NOTE  Date of Service: 09/14/2018  Rorey Bisson  PVX:[id number]  DOB: 02-19-1951   DOA: 09/05/2018  Referring Physician: Merton Border, MD  HPI: Clayton [name] is a 67 y.o. male seen for follow up of Acute on Chronic Respiratory Failure.  Patient is on T collar did about 22 hours yesterday had some issues with secretions today her goal should be 24 hours  Medications: Reviewed on Rounds  Physical Exam:  Vitals: Temperature 97.6 pulse 76 respiratory 32 blood pressure 127/78 saturation 96%  Ventilator Settings off the ventilator on T collar right now  . General: Comfortable at this time . Eyes: Grossly normal lids, irises & conjunctiva . ENT: grossly tongue is normal . Neck: no obvious mass . Cardiovascular: S1 S2 normal no gallop . Respiratory: No rhonchi or rales are noted . Abdomen: soft . Skin: no rash seen on limited exam . Musculoskeletal: not rigid . Psychiatric:unable to assess . Neurologic: no seizure no involuntary movements         Lab Data:   Basic Metabolic Panel: No results for input(s): NA, K, CL, CO2, GLUCOSE, BUN, CREATININE, CALCIUM, MG, PHOS in the last 168 hours.  ABG: No results for input(s): PHART, PCO2ART, PO2ART, HCO3, O2SAT in the last 168 hours.  Liver Function Tests: No results for input(s): AST, ALT, ALKPHOS, BILITOT, PROT, ALBUMIN in the last 168 hours. No results for input(s): LIPASE, AMYLASE in the last 168 hours. No results for input(s): AMMONIA in the last 168 hours.  CBC: No results for input(s): WBC, NEUTROABS, HGB, HCT, MCV, PLT in the last 168 hours.  Cardiac Enzymes: No results for input(s): CKTOTAL, CKMB, CKMBINDEX, TROPONINI in the last 168 hours.  BNP (last 3 results) Recent Labs    08/24/18 1409  BNP 678.0*    ProBNP (last 3 results) No results for input(s): PROBNP in the last 8760 hours.  Radiological  Exams: No results found.  Assessment/Plan Active Problems:   Acute on chronic respiratory failure with hypoxia (HCC)   COPD, severe (HCC)   Pulmonary arterial hypertension (HCC)   Non-STEMI (non-ST elevated myocardial infarction) ([place])   Embolic stroke involving right middle cerebral artery (HCC)   Altered mental status   1. Acute on chronic respiratory failure with hypoxia we will continue with T collar trials and secretion management continue pulmonary toilet 2. Severe COPD at baseline we will continue present therapy 3. Pulmonary hypertension at baseline 4. Non-STEMI grossly unchanged stable we will monitor 5. Embolic stroke physical therapy 6. Altered mental status unchanged   I have personally seen and evaluated the patient, evaluated laboratory and imaging results, formulated the assessment and plan and placed orders. The Patient requires high complexity decision making for assessment and support.  Case was discussed on Rounds with the Respiratory Therapy Staff  Allyne Gee, MD [facility] Pulmonary Critical Care Medicine Sleep Medicine

## 2018-09-15 NOTE — Progress Notes (Signed)
Pulmonary Critical Care Medicine [place]   PULMONARY CRITICAL CARE SERVICE  PROGRESS NOTE  Date of Service: 09/15/2018  Clayton [name]  IRW:[id number]  DOB: 08/29/1951   DOA: 09/05/2018  Referring Physician: Merton Border, MD  HPI: Clayton [name] is a 67 y.o. male seen for follow up of Acute on Chronic Respiratory Failure.  Patient is currently on T collar weans doing well.  He is on 28% FiO2 good saturations are noted  Medications: Reviewed on Rounds  Physical Exam:  Vitals: Temperature 98.0 pulse 61 respiratory rate 24 blood pressure 104/58 saturations 96%  Ventilator Settings off the ventilator on T collar  . General: Comfortable at this time . Eyes: Grossly normal lids, irises & conjunctiva . ENT: grossly tongue is normal . Neck: no obvious mass . Cardiovascular: S1 S2 normal no gallop . Respiratory: No rhonchi or rales are noted . Abdomen: soft . Skin: no rash seen on limited exam . Musculoskeletal: not rigid . Psychiatric:unable to assess . Neurologic: no seizure no involuntary movements         Lab Data:   Basic Metabolic Panel: No results for input(s): NA, K, CL, CO2, GLUCOSE, BUN, CREATININE, CALCIUM, MG, PHOS in the last 168 hours.  ABG: No results for input(s): PHART, PCO2ART, PO2ART, HCO3, O2SAT in the last 168 hours.  Liver Function Tests: No results for input(s): AST, ALT, ALKPHOS, BILITOT, PROT, ALBUMIN in the last 168 hours. No results for input(s): LIPASE, AMYLASE in the last 168 hours. No results for input(s): AMMONIA in the last 168 hours.  CBC: No results for input(s): WBC, NEUTROABS, HGB, HCT, MCV, PLT in the last 168 hours.  Cardiac Enzymes: No results for input(s): CKTOTAL, CKMB, CKMBINDEX, TROPONINI in the last 168 hours.  BNP (last 3 results) Recent Labs    08/24/18 1409  BNP 678.0*    ProBNP (last 3 results) No results for input(s): PROBNP in the last 8760 hours.  Radiological Exams: No results  found.  Assessment/Plan Active Problems:   Acute on chronic respiratory failure with hypoxia (HCC)   COPD, severe (HCC)   Pulmonary arterial hypertension (HCC)   Non-STEMI (non-ST elevated myocardial infarction) ([place])   Embolic stroke involving right middle cerebral artery (HCC)   Altered mental status   1. Acute on chronic respiratory failure with hypoxia we will continue weaning on T collar continue pulmonary toilet secretion management. 2. Severe COPD at baseline we will continue present management 3. Pulmonary hypertension at baseline we will continue with supportive care 4. Non-STEMI no active pain 5. Embolic stroke physical therapy 6. Multiple mental status he is at baseline   I have personally seen and evaluated the patient, evaluated laboratory and imaging results, formulated the assessment and plan and placed orders. The Patient requires high complexity decision making for assessment and support.  Case was discussed on Rounds with the Respiratory Therapy Staff  Allyne Gee, MD [facility] Pulmonary Critical Care Medicine Sleep Medicine

## 2018-09-16 LAB — CBC
HCT: 41 % (ref 39.0–52.0)
Hemoglobin: 13 g/dL (ref 13.0–17.0)
MCH: 29 pg (ref 26.0–34.0)
MCHC: 31.7 g/dL (ref 30.0–36.0)
MCV: 91.5 fL (ref 80.0–100.0)
Platelets: 188 10*3/uL (ref 150–400)
RBC: 4.48 MIL/uL (ref 4.22–5.81)
RDW: 13.7 % (ref 11.5–15.5)
WBC: 4.5 10*3/uL (ref 4.0–10.5)
nRBC: 0 % (ref 0.0–0.2)

## 2018-09-16 LAB — BASIC METABOLIC PANEL
Anion gap: 5 (ref 5–15)
BUN: 24 mg/dL — ABNORMAL HIGH (ref 8–23)
CO2: 35 mmol/L — ABNORMAL HIGH (ref 22–32)
CREATININE: 0.59 mg/dL — AB (ref 0.61–1.24)
Calcium: 9.8 mg/dL (ref 8.9–10.3)
Chloride: 93 mmol/L — ABNORMAL LOW (ref 98–111)
GFR calc Af Amer: >60 mL/min (ref >60)
GFR calc non Af Amer: >60 mL/min (ref >60)
Glucose, Bld: 161 mg/dL — ABNORMAL HIGH (ref 70–99)
Potassium: 4.8 mmol/L (ref 3.5–5.1)
Sodium: 133 mmol/L — ABNORMAL LOW (ref 135–145)

## 2018-09-16 NOTE — Progress Notes (Addendum)
Pulmonary Critical Care Medicine [place]   PULMONARY CRITICAL CARE SERVICE  PROGRESS NOTE  Date of Service: 09/16/2018  Clayton [name]  WNU:[id number]  DOB: Jul 21, 1951   DOA: 09/05/2018  Referring Physician: Merton Border, MD  HPI: Clayton [name] is a 68 y.o. male seen for follow up of Acute on Chronic Respiratory Failure.  Patient remains on trach collar at this time 28% FiO2.  The goal today on trach collar is 48 hours.  Patient continues to do well however does have a copious amount of secretions.  Medications: Reviewed on Rounds  Physical Exam:  Vitals: Pulse 77 respiratory 30 blood pressure 112/70 O2 sat 95% temp 98.1  Ventilator Settings patient's not currently on ventilator  . General: Comfortable at this time . Eyes: Grossly normal lids, irises & conjunctiva . ENT: grossly tongue is normal . Neck: no obvious mass . Cardiovascular: S1 S2 normal no gallop . Respiratory: Coarse breath sounds . Abdomen: soft . Skin: no rash seen on limited exam . Musculoskeletal: not rigid . Psychiatric:unable to assess . Neurologic: no seizure no involuntary movements         Lab Data:   Basic Metabolic Panel: Recent Labs  Lab 09/16/18 0609  NA 133*  K 4.8  CL 93*  CO2 35*  GLUCOSE 161*  BUN 24*  CREATININE 0.59*  CALCIUM 9.8    ABG: No results for input(s): PHART, PCO2ART, PO2ART, HCO3, O2SAT in the last 168 hours.  Liver Function Tests: No results for input(s): AST, ALT, ALKPHOS, BILITOT, PROT, ALBUMIN in the last 168 hours. No results for input(s): LIPASE, AMYLASE in the last 168 hours. No results for input(s): AMMONIA in the last 168 hours.  CBC: Recent Labs  Lab 09/16/18 0609  WBC 4.5  HGB 13.0  HCT 41.0  MCV 91.5  PLT 188    Cardiac Enzymes: No results for input(s): CKTOTAL, CKMB, CKMBINDEX, TROPONINI in the last 168 hours.  BNP (last 3 results) Recent Labs    08/24/18 1409  BNP 678.0*    ProBNP (last 3 results) No  results for input(s): PROBNP in the last 8760 hours.  Radiological Exams: No results found.  Assessment/Plan Active Problems:   Acute on chronic respiratory failure with hypoxia (HCC)   COPD, severe (HCC)   Pulmonary arterial hypertension (HCC)   Non-STEMI (non-ST elevated myocardial infarction) ([place])   Embolic stroke involving right middle cerebral artery (HCC)   Altered mental status   1. Acute on chronic respiratory failure with hypoxia continue weaning per protocol.  Continue pulmonary toilet and secretion management. 2. Severe COPD at baseline continue present management 3. Pulmonary hypertension at baseline continue supportive care 4. Non-STEMI no plans at this time 5. Embolic stroke physical therapy involved 6. Multiple mental status issues he is at baseline   I have personally seen and evaluated the patient, evaluated laboratory and imaging results, formulated the assessment and plan and placed orders. The Patient requires high complexity decision making for assessment and support.  Case was discussed on Rounds with the Respiratory Therapy Staff  Allyne Gee, MD [facility] Pulmonary Critical Care Medicine Sleep Medicine

## 2018-09-17 ENCOUNTER — Other Ambulatory Visit (HOSPITAL_COMMUNITY): Payer: Self-pay

## 2018-09-17 LAB — SODIUM, URINE, RANDOM: Sodium, Ur: 49 mmol/L

## 2018-09-17 LAB — OSMOLALITY, URINE: Osmolality, Ur: 530 mOsm/kg (ref 300–900)

## 2018-09-17 LAB — OSMOLALITY: Osmolality: 297 mOsm/kg — ABNORMAL HIGH (ref 275–295)

## 2018-09-17 NOTE — Progress Notes (Signed)
Pulmonary Critical Care Medicine [place]   PULMONARY CRITICAL CARE SERVICE  PROGRESS NOTE  Date of Service: 09/17/2018  Kit Mollett  WNI:[id number]  DOB: Jun 03, 1951   DOA: 09/05/2018  Referring Physician: Merton Border, MD  HPI: Clayton [name] is a 68 y.o. male seen for follow up of Acute on Chronic Respiratory Failure.  Right now is comfortable without distress currently patient is on T-bar has been on 28% FiO2  Medications: Reviewed on Rounds  Physical Exam:  Vitals: Temperature 98.0 pulse 78 respiratory 29 blood pressure 107/62 saturation 96%  Ventilator Settings currently off the ventilator on T collar  . General: Comfortable at this time . Eyes: Grossly normal lids, irises & conjunctiva . ENT: grossly tongue is normal . Neck: no obvious mass . Cardiovascular: S1 S2 normal no gallop . Respiratory: No rhonchi or rales are noted . Abdomen: soft . Skin: no rash seen on limited exam . Musculoskeletal: not rigid . Psychiatric:unable to assess . Neurologic: no seizure no involuntary movements         Lab Data:   Basic Metabolic Panel: Recent Labs  Lab 09/16/18 0609  NA 133*  K 4.8  CL 93*  CO2 35*  GLUCOSE 161*  BUN 24*  CREATININE 0.59*  CALCIUM 9.8    ABG: No results for input(s): PHART, PCO2ART, PO2ART, HCO3, O2SAT in the last 168 hours.  Liver Function Tests: No results for input(s): AST, ALT, ALKPHOS, BILITOT, PROT, ALBUMIN in the last 168 hours. No results for input(s): LIPASE, AMYLASE in the last 168 hours. No results for input(s): AMMONIA in the last 168 hours.  CBC: Recent Labs  Lab 09/16/18 0609  WBC 4.5  HGB 13.0  HCT 41.0  MCV 91.5  PLT 188    Cardiac Enzymes: No results for input(s): CKTOTAL, CKMB, CKMBINDEX, TROPONINI in the last 168 hours.  BNP (last 3 results) Recent Labs    08/24/18 1409  BNP 678.0*    ProBNP (last 3 results) No results for input(s): PROBNP in the last 8760 hours.  Radiological  Exams: No results found.  Assessment/Plan Active Problems:   Acute on chronic respiratory failure with hypoxia (HCC)   COPD, severe (HCC)   Pulmonary arterial hypertension (HCC)   Non-STEMI (non-ST elevated myocardial infarction) ([place])   Embolic stroke involving right middle cerebral artery (HCC)   Altered mental status   1. Acute on chronic respiratory failure with hypoxia we will continue with T collar trials patient is been off for 48 hours 2. Severe COPD at baseline continue present management 3. Pulmonary hypertension treated we will continue oxygen therapy 4. Non-STEMI no active ischemia 5. Embolic stroke therapy as tolerated 6. Altered mental status improving   I have personally seen and evaluated the patient, evaluated laboratory and imaging results, formulated the assessment and plan and placed orders. The Patient requires high complexity decision making for assessment and support.  Case was discussed on Rounds with the Respiratory Therapy Staff  Allyne Gee, MD [facility] Pulmonary Critical Care Medicine Sleep Medicine

## 2018-09-18 LAB — BASIC METABOLIC PANEL
Anion gap: 9 (ref 5–15)
BUN: 28 mg/dL — ABNORMAL HIGH (ref 8–23)
CO2: 34 mmol/L — AB (ref 22–32)
Calcium: 9.6 mg/dL (ref 8.9–10.3)
Chloride: 93 mmol/L — ABNORMAL LOW (ref 98–111)
Creatinine, Ser: 0.69 mg/dL (ref 0.61–1.24)
GFR calc Af Amer: >60 mL/min (ref >60)
GFR calc non Af Amer: >60 mL/min (ref >60)
Glucose, Bld: 165 mg/dL — ABNORMAL HIGH (ref 70–99)
Potassium: 4.6 mmol/L (ref 3.5–5.1)
Sodium: 136 mmol/L (ref 135–145)

## 2018-09-18 NOTE — Progress Notes (Signed)
Pulmonary Critical Care Medicine [place]   PULMONARY CRITICAL CARE SERVICE  PROGRESS NOTE  Date of Service: 09/18/2018  Clayton [name]  VFI:[id number]  DOB: 06-30-1951   DOA: 09/05/2018  Referring Physician: Merton Border, MD  HPI: Clayton [name] is a 68 y.o. male seen for follow up of Acute on Chronic Respiratory Failure.  Patient is doing fairly well.  Has been on T collar noted 28% FiO2  Medications: Reviewed on Rounds  Physical Exam:  Vitals: Temperature 97.2 pulse 76 respiratory rate 36 blood pressure 146/75 saturations 94%  Ventilator Settings currently off the ventilator on T collar FiO2 28%  . General: Comfortable at this time . Eyes: Grossly normal lids, irises & conjunctiva . ENT: grossly tongue is normal . Neck: no obvious mass . Cardiovascular: S1 S2 normal no gallop . Respiratory: Coarse breath sounds no rhonchi . Abdomen: soft . Skin: no rash seen on limited exam . Musculoskeletal: not rigid . Psychiatric:unable to assess . Neurologic: no seizure no involuntary movements         Lab Data:   Basic Metabolic Panel: Recent Labs  Lab 09/16/18 0609 09/18/18 0613  NA 133* 136  K 4.8 4.6  CL 93* 93*  CO2 35* 34*  GLUCOSE 161* 165*  BUN 24* 28*  CREATININE 0.59* 0.69  CALCIUM 9.8 9.6    ABG: No results for input(s): PHART, PCO2ART, PO2ART, HCO3, O2SAT in the last 168 hours.  Liver Function Tests: No results for input(s): AST, ALT, ALKPHOS, BILITOT, PROT, ALBUMIN in the last 168 hours. No results for input(s): LIPASE, AMYLASE in the last 168 hours. No results for input(s): AMMONIA in the last 168 hours.  CBC: Recent Labs  Lab 09/16/18 0609  WBC 4.5  HGB 13.0  HCT 41.0  MCV 91.5  PLT 188    Cardiac Enzymes: No results for input(s): CKTOTAL, CKMB, CKMBINDEX, TROPONINI in the last 168 hours.  BNP (last 3 results) Recent Labs    08/24/18 1409  BNP 678.0*    ProBNP (last 3 results) No results for input(s): PROBNP  in the last 8760 hours.  Radiological Exams: Dg Chest Port 1 View  Result Date: 09/17/2018 CLINICAL DATA:  Chronic ventilator dependent respiratory failure. Increasing secretions via the tracheostomy tube. EXAM: PORTABLE CHEST 1 VIEW COMPARISON:  09/12/2018 and earlier. FINDINGS: Tracheostomy tube tip in satisfactory position below the thoracic inlet. Feeding tube courses below the diaphragm into the stomach though its tip is not included on the image. Suboptimal inspiration accounting for mild atelectasis in the lung bases, unchanged. Mild chronic interstitial lung disease, unchanged. No new pulmonary parenchymal abnormalities. Cystic changes involving the LEFT humeral head and neck again noted. IMPRESSION: 1. Tracheostomy tube tip in satisfactory position below the thoracic inlet. 2. Suboptimal inspiration accounts for mild bibasilar atelectasis. No acute cardiopulmonary disease otherwise. Electronically Signed   By: Evangeline Dakin M.D.   On: 09/17/2018 14:03    Assessment/Plan Active Problems:   Acute on chronic respiratory failure with hypoxia (HCC)   COPD, severe (HCC)   Pulmonary arterial hypertension (HCC)   Non-STEMI (non-ST elevated myocardial infarction) ([place])   Embolic stroke involving right middle cerebral artery (HCC)   Altered mental status   1. Acute on chronic respiratory failure with hypoxia continue weaning on T collar patient should be downsized today to a cuffless trach 2. Severe COPD stable improving 3. Pulmonary hypertension continue with oxygen therapy 4. Non-STEMI no active chest pain 5. Embolic stroke treated we will continue with supportive care  6. Altered mental status patient is at baseline right now   I have personally seen and evaluated the patient, evaluated laboratory and imaging results, formulated the assessment and plan and placed orders. The Patient requires high complexity decision making for assessment and support.  Case was discussed on Rounds with  the Respiratory Therapy Staff  Allyne Gee, MD [facility] Pulmonary Critical Care Medicine Sleep Medicine

## 2018-09-19 LAB — CULTURE, RESPIRATORY W GRAM STAIN: Culture: NORMAL

## 2018-09-19 LAB — CULTURE, RESPIRATORY

## 2018-09-19 NOTE — Progress Notes (Signed)
Pulmonary Critical Care Medicine [place]   PULMONARY CRITICAL CARE SERVICE  PROGRESS NOTE  Date of Service: 09/19/2018  Clayton [name]  MEB:[id number]  DOB: 1950-12-06   DOA: 09/05/2018  Referring Physician: Merton Border, MD  HPI: Clayton [name] is a 68 y.o. male seen for follow up of Acute on Chronic Respiratory Failure.  Patient is on T collar he is doing well wanted to eat however is not been yet cleared  Medications: Reviewed on Rounds  Physical Exam:  Vitals: Temperature 97.9 pulse 65 respiratory 19 blood pressure 124/66 saturations 99%  Ventilator Settings off the ventilator on T collar right now  . General: Comfortable at this time . Eyes: Grossly normal lids, irises & conjunctiva . ENT: grossly tongue is normal . Neck: no obvious mass . Cardiovascular: S1 S2 normal no gallop . Respiratory: No rhonchi or rales are noted . Abdomen: soft . Skin: no rash seen on limited exam . Musculoskeletal: not rigid . Psychiatric:unable to assess . Neurologic: no seizure no involuntary movements         Lab Data:   Basic Metabolic Panel: Recent Labs  Lab 09/16/18 0609 09/18/18 0613  NA 133* 136  K 4.8 4.6  CL 93* 93*  CO2 35* 34*  GLUCOSE 161* 165*  BUN 24* 28*  CREATININE 0.59* 0.69  CALCIUM 9.8 9.6    ABG: No results for input(s): PHART, PCO2ART, PO2ART, HCO3, O2SAT in the last 168 hours.  Liver Function Tests: No results for input(s): AST, ALT, ALKPHOS, BILITOT, PROT, ALBUMIN in the last 168 hours. No results for input(s): LIPASE, AMYLASE in the last 168 hours. No results for input(s): AMMONIA in the last 168 hours.  CBC: Recent Labs  Lab 09/16/18 0609  WBC 4.5  HGB 13.0  HCT 41.0  MCV 91.5  PLT 188    Cardiac Enzymes: No results for input(s): CKTOTAL, CKMB, CKMBINDEX, TROPONINI in the last 168 hours.  BNP (last 3 results) Recent Labs    08/24/18 1409  BNP 678.0*    ProBNP (last 3 results) No results for input(s):  PROBNP in the last 8760 hours.  Radiological Exams: No results found.  Assessment/Plan Active Problems:   Acute on chronic respiratory failure with hypoxia (HCC)   COPD, severe (HCC)   Pulmonary arterial hypertension (HCC)   Non-STEMI (non-ST elevated myocardial infarction) ([place])   Embolic stroke involving right middle cerebral artery (HCC)   Altered mental status   1. Acute on chronic respiratory failure with hypoxia we will continue with wean on T collar.  Continue pulmonary toilet secretion management. 2. Severe COPD at baseline we will continue present therapy 3. Pulmonary hypertension supportive care oxygen therapy 4. Non-STEMI at baseline 5. Embolic stroke patient is awake doing fairly well 6. Altered mental status he is doing better and more awake and interacting with the examiner   I have personally seen and evaluated the patient, evaluated laboratory and imaging results, formulated the assessment and plan and placed orders. The Patient requires high complexity decision making for assessment and support.  Case was discussed on Rounds with the Respiratory Therapy Staff  Allyne Gee, MD [facility] Pulmonary Critical Care Medicine Sleep Medicine

## 2018-09-20 NOTE — Progress Notes (Signed)
Pulmonary Critical Care Medicine [place]   PULMONARY CRITICAL CARE SERVICE  PROGRESS NOTE  Date of Service: 09/20/2018  Clayton [name]  WUJ:[id number]  DOB: 04-08-1951   DOA: 09/05/2018  Referring Physician: Merton Border, MD  HPI: Clayton [name] is a 68 y.o. male seen for follow up of Acute on Chronic Respiratory Failure.  Patient is currently on T collar has been on 28% FiO2 secretions are very copious.  Has already been downsized to a #6 cuffless trach he is tolerating it well  Medications: Reviewed on Rounds  Physical Exam:  Vitals: Temperature 98.1 pulse 62 respiratory rate 20 blood pressure 131/74 saturations 94%  Ventilator Settings off the ventilator on T collar right now  . General: Comfortable at this time . Eyes: Grossly normal lids, irises & conjunctiva . ENT: grossly tongue is normal . Neck: no obvious mass . Cardiovascular: S1 S2 normal no gallop . Respiratory: Coarse breath sounds few rhonchi are noted . Abdomen: soft . Skin: no rash seen on limited exam . Musculoskeletal: not rigid . Psychiatric:unable to assess . Neurologic: no seizure no involuntary movements         Lab Data:   Basic Metabolic Panel: Recent Labs  Lab 09/16/18 0609 09/18/18 0613  NA 133* 136  K 4.8 4.6  CL 93* 93*  CO2 35* 34*  GLUCOSE 161* 165*  BUN 24* 28*  CREATININE 0.59* 0.69  CALCIUM 9.8 9.6    ABG: No results for input(s): PHART, PCO2ART, PO2ART, HCO3, O2SAT in the last 168 hours.  Liver Function Tests: No results for input(s): AST, ALT, ALKPHOS, BILITOT, PROT, ALBUMIN in the last 168 hours. No results for input(s): LIPASE, AMYLASE in the last 168 hours. No results for input(s): AMMONIA in the last 168 hours.  CBC: Recent Labs  Lab 09/16/18 0609  WBC 4.5  HGB 13.0  HCT 41.0  MCV 91.5  PLT 188    Cardiac Enzymes: No results for input(s): CKTOTAL, CKMB, CKMBINDEX, TROPONINI in the last 168 hours.  BNP (last 3 results) Recent Labs     08/24/18 1409  BNP 678.0*    ProBNP (last 3 results) No results for input(s): PROBNP in the last 8760 hours.  Radiological Exams: No results found.  Assessment/Plan Active Problems:   Acute on chronic respiratory failure with hypoxia (HCC)   COPD, severe (HCC)   Pulmonary arterial hypertension (HCC)   Non-STEMI (non-ST elevated myocardial infarction) ([place])   Embolic stroke involving right middle cerebral artery (HCC)   Altered mental status   1. Acute on chronic respiratory failure with hypoxia we will continue with aggressive pulmonary toilet supportive care.  Patient has increased secretions noted we will continue to monitor. 2. Severe COPD advanced disease continue with supportive care 3. Pulmonary hypertension on oxygen therapy 4. Non-STEMI at baseline 5. Embolic stroke continue with supportive care he is doing better 6. Altered mental status improved   I have personally seen and evaluated the patient, evaluated laboratory and imaging results, formulated the assessment and plan and placed orders. The Patient requires high complexity decision making for assessment and support.  Case was discussed on Rounds with the Respiratory Therapy Staff  Allyne Gee, MD [facility] Pulmonary Critical Care Medicine Sleep Medicine

## 2018-09-21 NOTE — Progress Notes (Signed)
Pulmonary Critical Care Medicine [place]   PULMONARY CRITICAL CARE SERVICE  PROGRESS NOTE  Date of Service: 09/21/2018  Clayton [name]  LDJ:[id number]  DOB: 1950-11-03   DOA: 09/05/2018  Referring Physician: Merton Border, MD  HPI: Clayton [name] is a 68 y.o. male seen for follow up of Acute on Chronic Respiratory Failure.  Patient currently is on T collar has been on 28% oxygen good saturations are noted.  Medications: Reviewed on Rounds  Physical Exam:  Vitals: Temperature 97.0 pulse 85 respiratory 18 blood pressure 131/72 saturations 98%  Ventilator Settings currently on T collar FiO2 is 28%  . General: Comfortable at this time . Eyes: Grossly normal lids, irises & conjunctiva . ENT: grossly tongue is normal . Neck: no obvious mass . Cardiovascular: S1 S2 normal no gallop . Respiratory: Coarse breath sounds no rhonchi . Abdomen: soft . Skin: no rash seen on limited exam . Musculoskeletal: not rigid . Psychiatric:unable to assess . Neurologic: no seizure no involuntary movements         Lab Data:   Basic Metabolic Panel: Recent Labs  Lab 09/16/18 0609 09/18/18 0613  NA 133* 136  K 4.8 4.6  CL 93* 93*  CO2 35* 34*  GLUCOSE 161* 165*  BUN 24* 28*  CREATININE 0.59* 0.69  CALCIUM 9.8 9.6    ABG: No results for input(s): PHART, PCO2ART, PO2ART, HCO3, O2SAT in the last 168 hours.  Liver Function Tests: No results for input(s): AST, ALT, ALKPHOS, BILITOT, PROT, ALBUMIN in the last 168 hours. No results for input(s): LIPASE, AMYLASE in the last 168 hours. No results for input(s): AMMONIA in the last 168 hours.  CBC: Recent Labs  Lab 09/16/18 0609  WBC 4.5  HGB 13.0  HCT 41.0  MCV 91.5  PLT 188    Cardiac Enzymes: No results for input(s): CKTOTAL, CKMB, CKMBINDEX, TROPONINI in the last 168 hours.  BNP (last 3 results) Recent Labs    08/24/18 1409  BNP 678.0*    ProBNP (last 3 results) No results for input(s): PROBNP in  the last 8760 hours.  Radiological Exams: No results found.  Assessment/Plan Active Problems:   Acute on chronic respiratory failure with hypoxia (HCC)   COPD, severe (HCC)   Pulmonary arterial hypertension (HCC)   Non-STEMI (non-ST elevated myocardial infarction) ([place])   Embolic stroke involving right middle cerebral artery (HCC)   Altered mental status   1. Acute on chronic respiratory failure with hypoxia patient is doing fine on T collar which will be continued continue aggressive pulmonary toilet titrate oxygen 2. Severe COPD at baseline continue present management 3. Pulmonary hypertension secondary we will continue with present therapy 4. Non-STEMI at baseline continue to follow 5. Embolic stroke unchanged we will continue supportive care 6. Altered mental status at baseline   I have personally seen and evaluated the patient, evaluated laboratory and imaging results, formulated the assessment and plan and placed orders. The Patient requires high complexity decision making for assessment and support.  Case was discussed on Rounds with the Respiratory Therapy Staff  Allyne Gee, MD [facility] Pulmonary Critical Care Medicine Sleep Medicine

## 2018-09-22 NOTE — Progress Notes (Signed)
Pulmonary Critical Care Medicine [place]   PULMONARY CRITICAL CARE SERVICE  PROGRESS NOTE  Date of Service: 09/22/2018  Clayton [name]  PYY:[id number]  DOB: [birth year]   DOA: 09/05/2018  Referring Physician: Merton Border, MD  HPI: Clayton [name] is a 68 y.o. male seen for follow up of Acute on Chronic Respiratory Failure.  Patient is on T collar comfortable right now without distress.  Is on 28% FiO2  Medications: Reviewed on Rounds  Physical Exam:  Vitals: Temperature 97.2 pulse 66 respiratory rate 16 blood pressure 115/69 saturations 97%  Ventilator Settings currently is off the ventilator on T collar  . General: Comfortable at this time . Eyes: Grossly normal lids, irises & conjunctiva . ENT: grossly tongue is normal . Neck: no obvious mass . Cardiovascular: S1 S2 normal no gallop . Respiratory: No rhonchi or rales are noted at this time . Abdomen: soft . Skin: no rash seen on limited exam . Musculoskeletal: not rigid . Psychiatric:unable to assess . Neurologic: no seizure no involuntary movements         Lab Data:   Basic Metabolic Panel: Recent Labs  Lab 09/16/18 0609 09/18/18 0613  NA 133* 136  K 4.8 4.6  CL 93* 93*  CO2 35* 34*  GLUCOSE 161* 165*  BUN 24* 28*  CREATININE 0.59* 0.69  CALCIUM 9.8 9.6    ABG: No results for input(s): PHART, PCO2ART, PO2ART, HCO3, O2SAT in the last 168 hours.  Liver Function Tests: No results for input(s): AST, ALT, ALKPHOS, BILITOT, PROT, ALBUMIN in the last 168 hours. No results for input(s): LIPASE, AMYLASE in the last 168 hours. No results for input(s): AMMONIA in the last 168 hours.  CBC: Recent Labs  Lab 09/16/18 0609  WBC 4.5  HGB 13.0  HCT 41.0  MCV 91.5  PLT 188    Cardiac Enzymes: No results for input(s): CKTOTAL, CKMB, CKMBINDEX, TROPONINI in the last 168 hours.  BNP (last 3 results) Recent Labs    08/24/18 1409  BNP 678.0*    ProBNP (last 3 results) No results for  input(s): PROBNP in the last 8760 hours.  Radiological Exams: No results found.  Assessment/Plan Active Problems:   Acute on chronic respiratory failure with hypoxia (HCC)   COPD, severe (HCC)   Pulmonary arterial hypertension (HCC)   Non-STEMI (non-ST elevated myocardial infarction) ([place])   Embolic stroke involving right middle cerebral artery (HCC)   Altered mental status   1. Acute on chronic respiratory failure with hypoxia we will continue with T collar trials secretions are fair to moderate continue pulmonary toilet. 2. Severe COPD continue present management 3. Pulmonary hypertension continue with oxygen therapy 4. Non-STEMI no active chest pain 5. Embolic stroke stable we will continue to monitor 6. Altered mental status at baseline   I have personally seen and evaluated the patient, evaluated laboratory and imaging results, formulated the assessment and plan and placed orders. The Patient requires high complexity decision making for assessment and support.  Case was discussed on Rounds with the Respiratory Therapy Staff  Allyne Gee, MD [facility] Pulmonary Critical Care Medicine Sleep Medicine

## 2018-09-23 ENCOUNTER — Other Ambulatory Visit (HOSPITAL_COMMUNITY): Payer: Self-pay

## 2018-09-23 NOTE — Progress Notes (Signed)
Pulmonary Critical Care Medicine [place]   PULMONARY CRITICAL CARE SERVICE  PROGRESS NOTE  Date of Service: 09/23/2018  Clayton [name]  [id number]  DOB: 1950-10-12   DOA: 09/05/2018  Referring Physician: Merton Border, MD  HPI: Clayton [name] is a 68 y.o. male seen for follow up of Acute on Chronic Respiratory Failure.  Patient is on T collar is on 28% FiO2 good saturations are noted.  Patient has been tolerating PMV fairly well.  Had a swallowing study which he did pass  Medications: Reviewed on Rounds  Physical Exam:  Vitals: Temperature 98.8 pulse 103 respiratory 15 blood pressure 122/65 saturations 100%  Ventilator Settings off the ventilator on T collar currently 28% FiO2  . General: Comfortable at this time . Eyes: Grossly normal lids, irises & conjunctiva . ENT: grossly tongue is normal . Neck: no obvious mass . Cardiovascular: S1 S2 normal no gallop . Respiratory: No rhonchi or rales are noted at this time . Abdomen: soft . Skin: no rash seen on limited exam . Musculoskeletal: not rigid . Psychiatric:unable to assess . Neurologic: no seizure no involuntary movements         Lab Data:   Basic Metabolic Panel: Recent Labs  Lab 09/18/18 0613  NA 136  K 4.6  CL 93*  CO2 34*  GLUCOSE 165*  BUN 28*  CREATININE 0.69  CALCIUM 9.6    ABG: No results for input(s): PHART, PCO2ART, PO2ART, HCO3, O2SAT in the last 168 hours.  Liver Function Tests: No results for input(s): AST, ALT, ALKPHOS, BILITOT, PROT, ALBUMIN in the last 168 hours. No results for input(s): LIPASE, AMYLASE in the last 168 hours. No results for input(s): AMMONIA in the last 168 hours.  CBC: No results for input(s): WBC, NEUTROABS, HGB, HCT, MCV, PLT in the last 168 hours.  Cardiac Enzymes: No results for input(s): CKTOTAL, CKMB, CKMBINDEX, TROPONINI in the last 168 hours.  BNP (last 3 results) Recent Labs    08/24/18 1409  BNP 678.0*    ProBNP (last 3  results) No results for input(s): PROBNP in the last 8760 hours.  Radiological Exams: No results found.  Assessment/Plan Active Problems:   Acute on chronic respiratory failure with hypoxia (HCC)   COPD, severe (HCC)   Pulmonary arterial hypertension (HCC)   Non-STEMI (non-ST elevated myocardial infarction) ([place])   Embolic stroke involving right middle cerebral artery (HCC)   Altered mental status   1. Acute on chronic respiratory failure with hypoxia continue with T collar continue secretion management pulmonary toilet and also the PMV.  Swallowing was passed hopefully will be able to do some capping trials soon 2. Severe COPD advanced disease continue present management 3. Pulmonary hypertension at baseline 4. Non-STEMI at baseline continue to monitor 5. Embolic stroke patient is doing somewhat better 6. Altered mental status improved continue with supportive care   I have personally seen and evaluated the patient, evaluated laboratory and imaging results, formulated the assessment and plan and placed orders. The Patient requires high complexity decision making for assessment and support.  Case was discussed on Rounds with the Respiratory Therapy Staff  Allyne Gee, MD [facility] Pulmonary Critical Care Medicine Sleep Medicine

## 2018-09-24 ENCOUNTER — Ambulatory Visit (HOSPITAL_COMMUNITY): Admission: RE | Admit: 2018-09-24 | Payer: Medicare Other | Source: Home / Self Care | Admitting: Internal Medicine

## 2018-09-24 ENCOUNTER — Encounter (HOSPITAL_COMMUNITY): Admission: RE | Payer: Self-pay | Source: Home / Self Care

## 2018-09-24 SURGERY — COLONOSCOPY
Anesthesia: Moderate Sedation

## 2018-09-24 NOTE — Progress Notes (Signed)
Pulmonary Critical Care Medicine [place]   PULMONARY CRITICAL CARE SERVICE  PROGRESS NOTE  Date of Service: 09/24/2018  Clayton [name]  [id number]  DOB: 11/22/1950   DOA: 09/05/2018  Referring Physician: Merton Border, MD  HPI: Clayton [name] is a 68 y.o. male seen for follow up of Acute on Chronic Respiratory Failure.  Patient is comfortable right now on T collar awaiting discharge planning.  Right now without distress on 28% FiO2  Medications: Reviewed on Rounds  Physical Exam:  Vitals: Temperature 98.0 pulse 74 respiratory 22 blood pressure 145/78 saturations 98%  Ventilator Settings on T collar FiO2 28%  . General: Comfortable at this time . Eyes: Grossly normal lids, irises & conjunctiva . ENT: grossly tongue is normal . Neck: no obvious mass . Cardiovascular: S1 S2 normal no gallop . Respiratory: Scattered rhonchi expansion is equal at this time . Abdomen: soft . Skin: no rash seen on limited exam . Musculoskeletal: not rigid . Psychiatric:unable to assess . Neurologic: no seizure no involuntary movements         Lab Data:   Basic Metabolic Panel: Recent Labs  Lab 09/18/18 0613  NA 136  K 4.6  CL 93*  CO2 34*  GLUCOSE 165*  BUN 28*  CREATININE 0.69  CALCIUM 9.6    ABG: No results for input(s): PHART, PCO2ART, PO2ART, HCO3, O2SAT in the last 168 hours.  Liver Function Tests: No results for input(s): AST, ALT, ALKPHOS, BILITOT, PROT, ALBUMIN in the last 168 hours. No results for input(s): LIPASE, AMYLASE in the last 168 hours. No results for input(s): AMMONIA in the last 168 hours.  CBC: No results for input(s): WBC, NEUTROABS, HGB, HCT, MCV, PLT in the last 168 hours.  Cardiac Enzymes: No results for input(s): CKTOTAL, CKMB, CKMBINDEX, TROPONINI in the last 168 hours.  BNP (last 3 results) Recent Labs    08/24/18 1409  BNP 678.0*    ProBNP (last 3 results) No results for input(s): PROBNP in the last 8760  hours.  Radiological Exams: No results found.  Assessment/Plan Active Problems:   Acute on chronic respiratory failure with hypoxia (HCC)   COPD, severe (HCC)   Pulmonary arterial hypertension (HCC)   Non-STEMI (non-ST elevated myocardial infarction) ([place])   Embolic stroke involving right middle cerebral artery (HCC)   Altered mental status   1. Acute on chronic respiratory failure with hypoxia at this time patient will be continued on T collar is at baseline we will continue secretion management pulmonary toilet. 2. COPD severe disease continue with nebulizer as necessary. 3. Pulmonary hypertension oxygen therapy 4. Non-STEMI at baseline 5. Embolic stroke remains unchanged we will continue with present management. 6. Altered mental status at baseline   I have personally seen and evaluated the patient, evaluated laboratory and imaging results, formulated the assessment and plan and placed orders. The Patient requires high complexity decision making for assessment and support.  Case was discussed on Rounds with the Respiratory Therapy Staff  Allyne Gee, MD [facility] Pulmonary Critical Care Medicine Sleep Medicine

## 2018-09-25 NOTE — Progress Notes (Signed)
Pulmonary Critical Care Medicine [place]   PULMONARY CRITICAL CARE SERVICE  PROGRESS NOTE  Date of Service: 09/25/2018  [name]  [id number]:[id number]  DOB: 12/26/1950   DOA: 09/05/2018  Referring Physician: Merton Border, MD  HPI: Clayton [name] is a 68 y.o. male seen for follow up of Acute on Chronic Respiratory Failure.  He is on T collar doing fairly well.  Should be able to be capped hopefully soon.  The patient is on PMV  Medications: Reviewed on Rounds  Physical Exam:  Vitals: Temperature 97.0 pulse 81 respiratory 24 blood pressure 120/70 saturations 97%  Ventilator Settings mode of ventilation is off the ventilator on T collar FiO2 28% with the PMV in place  . General: Comfortable at this time . Eyes: Grossly normal lids, irises & conjunctiva . ENT: grossly tongue is normal . Neck: no obvious mass . Cardiovascular: S1 S2 normal no gallop . Respiratory: Coarse breath sounds no rhonchi are noted . Abdomen: soft . Skin: no rash seen on limited exam . Musculoskeletal: not rigid . Psychiatric:unable to assess . Neurologic: no seizure no involuntary movements         Lab Data:   Basic Metabolic Panel: No results for input(s): NA, K, CL, CO2, GLUCOSE, BUN, CREATININE, CALCIUM, MG, PHOS in the last 168 hours.  ABG: No results for input(s): PHART, PCO2ART, PO2ART, HCO3, O2SAT in the last 168 hours.  Liver Function Tests: No results for input(s): AST, ALT, ALKPHOS, BILITOT, PROT, ALBUMIN in the last 168 hours. No results for input(s): LIPASE, AMYLASE in the last 168 hours. No results for input(s): AMMONIA in the last 168 hours.  CBC: No results for input(s): WBC, NEUTROABS, HGB, HCT, MCV, PLT in the last 168 hours.  Cardiac Enzymes: No results for input(s): CKTOTAL, CKMB, CKMBINDEX, TROPONINI in the last 168 hours.  BNP (last 3 results) Recent Labs    08/24/18 1409  BNP 678.0*    ProBNP (last 3 results) No results for input(s): PROBNP  in the last 8760 hours.  Radiological Exams: No results found.  Assessment/Plan Active Problems:   Acute on chronic respiratory failure with hypoxia (HCC)   COPD, severe (HCC)   Pulmonary arterial hypertension (HCC)   Non-STEMI (non-ST elevated myocardial infarction) ([place])   Embolic stroke involving right middle cerebral artery (HCC)   Altered mental status   1. Acute on chronic respiratory failure with hypoxia we will continue with advancing the wean he has been on T collar with PMV we should be able to start capping discussed on rounds with respiratory therapy. 2. Severe COPD continue present management as needed inhalers 3. Pulmonary hypertension continue with oxygen therapy as necessary 4. Non-STEMI at baseline 5. Embolic stroke improving 6. Altered mental status improving   I have personally seen and evaluated the patient, evaluated laboratory and imaging results, formulated the assessment and plan and placed orders. The Patient requires high complexity decision making for assessment and support.  Case was discussed on Rounds with the Respiratory Therapy Staff  Allyne Gee, MD [facility] Pulmonary Critical Care Medicine Sleep Medicine

## 2018-09-26 LAB — URINALYSIS, ROUTINE W REFLEX MICROSCOPIC: RBC / HPF: >50 RBC/hpf — ABNORMAL HIGH (ref 0–5)

## 2018-09-26 NOTE — Progress Notes (Signed)
Pulmonary Critical Care Medicine [place]   PULMONARY CRITICAL CARE SERVICE  PROGRESS NOTE  Date of Service: 09/26/2018  Eunice Oldaker  [id number]  DOB: 01-Dec-1950   DOA: 09/05/2018  Referring Physician: Merton Border, MD  HPI: Bird Swetz is a 68 y.o. male seen for follow up of Acute on Chronic Respiratory Failure.  At this time patient is on no capping trials he is actually looking very good.  Secretions have been minimal  Medications: Reviewed on Rounds  Physical Exam:  Vitals: Temperature 97.3 pulse 83 respiratory 18 blood pressure 133/77 saturations 100%  Ventilator Settings capping off the ventilator  . General: Comfortable at this time . Eyes: Grossly normal lids, irises & conjunctiva . ENT: grossly tongue is normal . Neck: no obvious mass . Cardiovascular: S1 S2 normal no gallop . Respiratory: No rhonchi or rales are noted at this time . Abdomen: soft . Skin: no rash seen on limited exam . Musculoskeletal: not rigid . Psychiatric:unable to assess . Neurologic: no seizure no involuntary movements         Lab Data:   Basic Metabolic Panel: No results for input(s): NA, K, CL, CO2, GLUCOSE, BUN, CREATININE, CALCIUM, MG, PHOS in the last 168 hours.  ABG: No results for input(s): PHART, PCO2ART, PO2ART, HCO3, O2SAT in the last 168 hours.  Liver Function Tests: No results for input(s): AST, ALT, ALKPHOS, BILITOT, PROT, ALBUMIN in the last 168 hours. No results for input(s): LIPASE, AMYLASE in the last 168 hours. No results for input(s): AMMONIA in the last 168 hours.  CBC: No results for input(s): WBC, NEUTROABS, HGB, HCT, MCV, PLT in the last 168 hours.  Cardiac Enzymes: No results for input(s): CKTOTAL, CKMB, CKMBINDEX, TROPONINI in the last 168 hours.  BNP (last 3 results) Recent Labs    08/24/18 1409  BNP 678.0*    ProBNP (last 3 results) No results for input(s): PROBNP in the last 8760 hours.  Radiological Exams: No  results found.  Assessment/Plan Active Problems:   Acute on chronic respiratory failure with hypoxia (HCC)   COPD, severe (HCC)   Pulmonary arterial hypertension (HCC)   Non-STEMI (non-ST elevated myocardial infarction) ([place])   Embolic stroke involving right middle cerebral artery (HCC)   Altered mental status   1. Acute on chronic respiratory failure with hypoxia continue with capping trials continue supportive care continue pulmonary toilet 2. Severe COPD at baseline continue present management 3. Pulmonary hypertension on oxygen therapy 3 L 4. Non-STEMI at baseline 5. Embolic stroke at baseline continue with therapy as tolerated 6. Altered mental status improved   I have personally seen and evaluated the patient, evaluated laboratory and imaging results, formulated the assessment and plan and placed orders. The Patient requires high complexity decision making for assessment and support.  Case was discussed on Rounds with the Respiratory Therapy Staff  Allyne Gee, MD [facility] Pulmonary Critical Care Medicine Sleep Medicine

## 2018-09-27 LAB — CBC
HCT: 40.5 % (ref 39.0–52.0)
Hemoglobin: 13.4 g/dL (ref 13.0–17.0)
MCH: 30.7 pg (ref 26.0–34.0)
MCHC: 33.1 g/dL (ref 30.0–36.0)
MCV: 92.7 fL (ref 80.0–100.0)
Platelets: 237 10*3/uL (ref 150–400)
RBC: 4.37 MIL/uL (ref 4.22–5.81)
RDW: 14.2 % (ref 11.5–15.5)
WBC: 8.8 10*3/uL (ref 4.0–10.5)
nRBC: 0 % (ref 0.0–0.2)

## 2018-09-27 NOTE — Progress Notes (Signed)
Pulmonary Critical Care Medicine [place]   PULMONARY CRITICAL CARE SERVICE  PROGRESS NOTE  Date of Service: 09/27/2018  Clayton [name]  [id number]  DOB: 1951/03/17   DOA: 09/05/2018  Referring Physician: Merton Border, MD  HPI: Clayton [name] is a 68 y.o. male seen for follow up of Acute on Chronic Respiratory Failure.  Patient is off the ventilator currently is capping doing well for 48 hours  Medications: Reviewed on Rounds  Physical Exam:  Vitals: Temperature 97.6 pulse 83 respiratory 15 blood pressure 112/74 saturations 98%  Ventilator Settings capping off the ventilator at this time  . General: Comfortable at this time . Eyes: Grossly normal lids, irises & conjunctiva . ENT: grossly tongue is normal . Neck: no obvious mass . Cardiovascular: S1 S2 normal no gallop . Respiratory: No rhonchi or rales are noted . Abdomen: soft . Skin: no rash seen on limited exam . Musculoskeletal: not rigid . Psychiatric:unable to assess . Neurologic: no seizure no involuntary movements         Lab Data:   Basic Metabolic Panel: No results for input(s): NA, K, CL, CO2, GLUCOSE, BUN, CREATININE, CALCIUM, MG, PHOS in the last 168 hours.  ABG: No results for input(s): PHART, PCO2ART, PO2ART, HCO3, O2SAT in the last 168 hours.  Liver Function Tests: No results for input(s): AST, ALT, ALKPHOS, BILITOT, PROT, ALBUMIN in the last 168 hours. No results for input(s): LIPASE, AMYLASE in the last 168 hours. No results for input(s): AMMONIA in the last 168 hours.  CBC: Recent Labs  Lab 09/27/18 0502  WBC 8.8  HGB 13.4  HCT 40.5  MCV 92.7  PLT 237    Cardiac Enzymes: No results for input(s): CKTOTAL, CKMB, CKMBINDEX, TROPONINI in the last 168 hours.  BNP (last 3 results) Recent Labs    08/24/18 1409  BNP 678.0*    ProBNP (last 3 results) No results for input(s): PROBNP in the last 8760 hours.  Radiological Exams: No results  found.  Assessment/Plan Active Problems:   Acute on chronic respiratory failure with hypoxia (HCC)   COPD, severe (HCC)   Pulmonary arterial hypertension (HCC)   Non-STEMI (non-ST elevated myocardial infarction) ([place])   Embolic stroke involving right middle cerebral artery (HCC)   Altered mental status   1. Acute on chronic respiratory failure with hypoxia we will continue with capping trials continue secretion management pulmonary toilet. 2. Severe COPD at baseline we will continue present therapy 3. Pulmonary hypertension at baseline 4. Non-STEMI stable at this time 5. Embolic stroke at baseline we will continue present management. 6. Altered mental status grossly unchanged   I have personally seen and evaluated the patient, evaluated laboratory and imaging results, formulated the assessment and plan and placed orders. The Patient requires high complexity decision making for assessment and support.  Case was discussed on Rounds with the Respiratory Therapy Staff  Allyne Gee, MD [facility] Pulmonary Critical Care Medicine Sleep Medicine

## 2018-09-28 LAB — URINE CULTURE: Culture: >=100000 — AB

## 2018-09-28 NOTE — Progress Notes (Signed)
Pulmonary Critical Care Medicine [place]   PULMONARY CRITICAL CARE SERVICE  PROGRESS NOTE  Date of Service: 09/28/2018  Clayton [name]  [id number]  DOB: 03-13-1951   DOA: 09/05/2018  Referring Physician: Merton Border, MD  HPI: Clayton [name] is a 68 y.o. male seen for follow up of Acute on Chronic Respiratory Failure.  Patient currently is capping has been off the ventilator for more than 72 hours capping  Medications: Reviewed on Rounds  Physical Exam:  Vitals: Temperature 97.9 pulse 83 respiratory 20 blood pressure 142/76 saturations 97%  Ventilator Settings capping off the ventilator  . General: Comfortable at this time . Eyes: Grossly normal lids, irises & conjunctiva . ENT: grossly tongue is normal . Neck: no obvious mass . Cardiovascular: S1 S2 normal no gallop . Respiratory: No rhonchi or rales are noted at this time . Abdomen: soft . Skin: no rash seen on limited exam . Musculoskeletal: not rigid . Psychiatric:unable to assess . Neurologic: no seizure no involuntary movements         Lab Data:   Basic Metabolic Panel: No results for input(s): NA, K, CL, CO2, GLUCOSE, BUN, CREATININE, CALCIUM, MG, PHOS in the last 168 hours.  ABG: No results for input(s): PHART, PCO2ART, PO2ART, HCO3, O2SAT in the last 168 hours.  Liver Function Tests: No results for input(s): AST, ALT, ALKPHOS, BILITOT, PROT, ALBUMIN in the last 168 hours. No results for input(s): LIPASE, AMYLASE in the last 168 hours. No results for input(s): AMMONIA in the last 168 hours.  CBC: Recent Labs  Lab 09/27/18 0502  WBC 8.8  HGB 13.4  HCT 40.5  MCV 92.7  PLT 237    Cardiac Enzymes: No results for input(s): CKTOTAL, CKMB, CKMBINDEX, TROPONINI in the last 168 hours.  BNP (last 3 results) Recent Labs    08/24/18 1409  BNP 678.0*    ProBNP (last 3 results) No results for input(s): PROBNP in the last 8760 hours.  Radiological Exams: No results  found.  Assessment/Plan Active Problems:   Acute on chronic respiratory failure with hypoxia (HCC)   COPD, severe (HCC)   Pulmonary arterial hypertension (HCC)   Non-STEMI (non-ST elevated myocardial infarction) ([place])   Embolic stroke involving right middle cerebral artery (HCC)   Altered mental status   1. Acute on chronic respiratory failure with hypoxia we will continue with capping as above I am planning on decannulated him in the next day 2. Severe COPD will continue with supportive care 3. Pulmonary hypertension at baseline 4. Non-STEMI no active chest pain 5. Embolic stroke at baseline 6. Mental status altered patient is at baseline right now   I have personally seen and evaluated the patient, evaluated laboratory and imaging results, formulated the assessment and plan and placed orders. The Patient requires high complexity decision making for assessment and support.  Case was discussed on Rounds with the Respiratory Therapy Staff  Allyne Gee, MD [facility] Pulmonary Critical Care Medicine Sleep Medicine

## 2018-09-29 NOTE — Progress Notes (Signed)
Pulmonary Critical Care Medicine [place]   PULMONARY CRITICAL CARE SERVICE  PROGRESS NOTE  Date of Service: 09/29/2018  Clayton [name]  BWG:[id number]  DOB: Oct 29, 1950   DOA: 09/05/2018  Referring Physician: Merton Border, MD  HPI: Clayton [name] is a 68 y.o. male seen for follow up of Acute on Chronic Respiratory Failure.  Patient currently is on capping trials is doing fairly well has been on 2 L we should be able to proceed to decannulation  Medications: Reviewed on Rounds  Physical Exam:  Vitals: Temperature 97.5 pulse 80 respiratory rate 16 blood pressure 134/77 saturation 97%  Ventilator Settings capping off the ventilator  . General: Comfortable at this time . Eyes: Grossly normal lids, irises & conjunctiva . ENT: grossly tongue is normal . Neck: no obvious mass . Cardiovascular: S1 S2 normal no gallop . Respiratory: No rhonchi or rales are noted at this time . Abdomen: soft . Skin: no rash seen on limited exam . Musculoskeletal: not rigid . Psychiatric:unable to assess . Neurologic: no seizure no involuntary movements         Lab Data:   Basic Metabolic Panel: No results for input(s): NA, K, CL, CO2, GLUCOSE, BUN, CREATININE, CALCIUM, MG, PHOS in the last 168 hours.  ABG: No results for input(s): PHART, PCO2ART, PO2ART, HCO3, O2SAT in the last 168 hours.  Liver Function Tests: No results for input(s): AST, ALT, ALKPHOS, BILITOT, PROT, ALBUMIN in the last 168 hours. No results for input(s): LIPASE, AMYLASE in the last 168 hours. No results for input(s): AMMONIA in the last 168 hours.  CBC: Recent Labs  Lab 09/27/18 0502  WBC 8.8  HGB 13.4  HCT 40.5  MCV 92.7  PLT 237    Cardiac Enzymes: No results for input(s): CKTOTAL, CKMB, CKMBINDEX, TROPONINI in the last 168 hours.  BNP (last 3 results) Recent Labs    08/24/18 1409  BNP 678.0*    ProBNP (last 3 results) No results for input(s): PROBNP in the last 8760  hours.  Radiological Exams: No results found.  Assessment/Plan Active Problems:   Acute on chronic respiratory failure with hypoxia (HCC)   COPD, severe (HCC)   Pulmonary arterial hypertension (HCC)   Non-STEMI (non-ST elevated myocardial infarction) ([place])   Embolic stroke involving right middle cerebral artery (HCC)   Altered mental status   1. Acute on chronic respiratory failure with hypoxia we will continue with the wean and proceed to decannulation today nasal cannula. 2. Severe COPD at baseline continue present management 3. Pulmonary hypertension at baseline continue with supportive care 4. Non-STEMI no active chest pain 5. Embolic stroke at baseline 6. Altered mental status improved   I have personally seen and evaluated the patient, evaluated laboratory and imaging results, formulated the assessment and plan and placed orders. The Patient requires high complexity decision making for assessment and support.  Case was discussed on Rounds with the Respiratory Therapy Staff  Allyne Gee, MD [facility] Pulmonary Critical Care Medicine Sleep Medicine

## 2018-09-30 NOTE — Progress Notes (Signed)
Pulmonary Critical Care Medicine [place]   PULMONARY CRITICAL CARE SERVICE  PROGRESS NOTE  Date of Service: 09/30/2018  Clayton [name]  [id number]:[id number]  DOB: 12-17-1950   DOA: 09/05/2018  Referring Physician: Merton Border, MD  HPI: Clayton [name] is a 68 y.o. male seen for follow up of Acute on Chronic Respiratory Failure.  Patient is decannulated right now doing well [name] distress is noted  Oxygen as needed  Medications: Reviewed on Rounds  Physical Exam:  Vitals: Temperature 98.5 pulse 71 respiratory 18 blood pressure 170/66 saturations 95%  Ventilator Settings off the ventilator decannulated  . General: Comfortable at this time . Eyes: Grossly normal lids, irises & conjunctiva . ENT: grossly tongue is normal . Neck: [name] obvious mass . Cardiovascular: S1 S2 normal [name] gallop . Respiratory: [name] rhonchi or rales are noted at this time . Abdomen: soft . Skin: [name] rash seen on limited exam . Musculoskeletal: not rigid . Psychiatric:unable to assess . Neurologic: [name] seizure [name] involuntary movements         Lab Data:   Basic Metabolic Panel: [name] results for input(s): NA, K, CL, CO2, GLUCOSE, BUN, CREATININE, CALCIUM, MG, PHOS in the last 168 hours.  ABG: [name] results for input(s): PHART, PCO2ART, PO2ART, HCO3, O2SAT in the last 168 hours.  Liver Function Tests: [name] results for input(s): AST, ALT, ALKPHOS, BILITOT, PROT, ALBUMIN in the last 168 hours. [name] results for input(s): LIPASE, AMYLASE in the last 168 hours. [name] results for input(s): AMMONIA in the last 168 hours.  CBC: Recent Labs  Lab 09/27/18 0502  WBC 8.8  HGB 13.4  HCT 40.5  MCV 92.7  PLT 237    Cardiac Enzymes: [name] results for input(s): CKTOTAL, CKMB, CKMBINDEX, TROPONINI in the last 168 hours.  BNP (last 3 results) Recent Labs    08/24/18 1409  BNP 678.0*    ProBNP (last 3 results) [name] results for input(s): PROBNP in the last 8760 hours.  Radiological Exams: [name] results  found.  Assessment/Plan Active Problems:   Acute on chronic respiratory failure with hypoxia (HCC)   COPD, severe (HCC)   Pulmonary arterial hypertension (HCC)   Non-STEMI (non-ST elevated myocardial infarction) ([place])   Embolic stroke involving right middle cerebral artery (HCC)   Altered mental status   1. Acute on chronic respiratory failure with hypoxia we will continue with the oxygen as necessary we will continue to monitor 2. Severe COPD at baseline continue with nebs as needed 3. Pulmonary hypertension oxygen as needed 4. Non-STEMI patient's at baseline 5. Embolic stroke continue rehab 6. All per staff mental status slowly improving   I have personally seen and evaluated the patient, evaluated laboratory and imaging results, formulated the assessment and plan and placed orders. The Patient requires high complexity decision making for assessment and support.  Case was discussed on Rounds with the Respiratory Therapy Staff  Allyne Gee, MD [facility] Pulmonary Critical Care Medicine Sleep Medicine

## 2018-10-01 NOTE — Progress Notes (Signed)
Pulmonary Critical Care Medicine [place]   PULMONARY CRITICAL CARE SERVICE  PROGRESS NOTE  Date of Service: 10/01/2018  Michail Boyte  QIH:[id number]  DOB: 22-Apr-1951   DOA: 09/05/2018  Referring Physician: Merton Border, MD  HPI: Clayton [name] is a 68 y.o. male seen for follow up of Acute on Chronic Respiratory Failure.  Patient right now is comfortable he was decannulated has done well stoma is almost closed  Medications: Reviewed on Rounds  Physical Exam:  Vitals: Temperature 97.4 pulse 72 respiratory 18 blood pressure 115/66 saturations 95%  Ventilator Settings off the ventilator decannulated  . General: Comfortable at this time . Eyes: Grossly normal lids, irises & conjunctiva . ENT: grossly tongue is normal . Neck: no obvious mass . Cardiovascular: S1 S2 normal no gallop . Respiratory: No rhonchi or rales are noted . Abdomen: soft . Skin: no rash seen on limited exam . Musculoskeletal: not rigid . Psychiatric:unable to assess . Neurologic: no seizure no involuntary movements         Lab Data:   Basic Metabolic Panel: No results for input(s): NA, K, CL, CO2, GLUCOSE, BUN, CREATININE, CALCIUM, MG, PHOS in the last 168 hours.  ABG: No results for input(s): PHART, PCO2ART, PO2ART, HCO3, O2SAT in the last 168 hours.  Liver Function Tests: No results for input(s): AST, ALT, ALKPHOS, BILITOT, PROT, ALBUMIN in the last 168 hours. No results for input(s): LIPASE, AMYLASE in the last 168 hours. No results for input(s): AMMONIA in the last 168 hours.  CBC: Recent Labs  Lab 09/27/18 0502  WBC 8.8  HGB 13.4  HCT 40.5  MCV 92.7  PLT 237    Cardiac Enzymes: No results for input(s): CKTOTAL, CKMB, CKMBINDEX, TROPONINI in the last 168 hours.  BNP (last 3 results) Recent Labs    08/24/18 1409  BNP 678.0*    ProBNP (last 3 results) No results for input(s): PROBNP in the last 8760 hours.  Radiological Exams: No results  found.  Assessment/Plan Active Problems:   Acute on chronic respiratory failure with hypoxia (HCC)   COPD, severe (HCC)   Pulmonary arterial hypertension (HCC)   Non-STEMI (non-ST elevated myocardial infarction) ([place])   Embolic stroke involving right middle cerebral artery (HCC)   Altered mental status   1. Acute on chronic respiratory failure with hypoxia we will continue with supportive care oxygen as necessary to monitor stoma for closure 2. Severe COPD at baseline we will continue present management 3. Pulmonary hypertension at baseline oxygen therapy as necessary 4. Non-STEMI no active chest pain 5. Embolic stroke patient's improving 6. Altered mental status improving   I have personally seen and evaluated the patient, evaluated laboratory and imaging results, formulated the assessment and plan and placed orders. The Patient requires high complexity decision making for assessment and support.  Case was discussed on Rounds with the Respiratory Therapy Staff  Allyne Gee, MD [facility] Pulmonary Critical Care Medicine Sleep Medicine

## 2018-10-02 LAB — URINALYSIS, ROUTINE W REFLEX MICROSCOPIC
Bilirubin Urine: NEGATIVE
Glucose, UA: NEGATIVE mg/dL
Ketones, ur: NEGATIVE mg/dL
Leukocytes, UA: NEGATIVE
Nitrite: NEGATIVE
Protein, ur: NEGATIVE mg/dL
Specific Gravity, Urine: 1.018 (ref 1.005–1.030)
pH: 5 (ref 5.0–8.0)

## 2018-10-02 LAB — CBC
HEMATOCRIT: 38.3 % — AB (ref 39.0–52.0)
Hemoglobin: 12.5 g/dL — ABNORMAL LOW (ref 13.0–17.0)
MCH: 29.5 pg (ref 26.0–34.0)
MCHC: 32.6 g/dL (ref 30.0–36.0)
MCV: 90.3 fL (ref 80.0–100.0)
Platelets: 209 10*3/uL (ref 150–400)
RBC: 4.24 MIL/uL (ref 4.22–5.81)
RDW: 13.8 % (ref 11.5–15.5)
WBC: 10.3 10*3/uL (ref 4.0–10.5)
nRBC: 0 % (ref 0.0–0.2)

## 2018-10-02 NOTE — Progress Notes (Signed)
Pulmonary Critical Care Medicine [place]   PULMONARY CRITICAL CARE SERVICE  PROGRESS NOTE  Date of Service: 10/02/2018  Clayton [name]  CMK:[id number]  DOB: 1951/06/19   DOA: 09/05/2018  Referring Physician: Merton Border, MD  HPI: Clayton [name] is a 68 y.o. male seen for follow up of Acute on Chronic Respiratory Failure.  Patient is decannulated looks good stoma is about closed.  Medications: Reviewed on Rounds  Physical Exam:  Vitals: Temperature 97.5 pulse 73 respiratory 20 blood pressure 142/87 saturations 98%  Ventilator Settings off the ventilator  . General: Comfortable at this time . Eyes: Grossly normal lids, irises & conjunctiva . ENT: grossly tongue is normal . Neck: no obvious mass . Cardiovascular: S1 S2 normal no gallop . Respiratory: No rhonchi or rales are noted at this time . Abdomen: soft . Skin: no rash seen on limited exam . Musculoskeletal: not rigid . Psychiatric:unable to assess . Neurologic: no seizure no involuntary movements         Lab Data:   Basic Metabolic Panel: No results for input(s): NA, K, CL, CO2, GLUCOSE, BUN, CREATININE, CALCIUM, MG, PHOS in the last 168 hours.  ABG: No results for input(s): PHART, PCO2ART, PO2ART, HCO3, O2SAT in the last 168 hours.  Liver Function Tests: No results for input(s): AST, ALT, ALKPHOS, BILITOT, PROT, ALBUMIN in the last 168 hours. No results for input(s): LIPASE, AMYLASE in the last 168 hours. No results for input(s): AMMONIA in the last 168 hours.  CBC: Recent Labs  Lab 09/27/18 0502 10/02/18 0549  WBC 8.8 10.3  HGB 13.4 12.5*  HCT 40.5 38.3*  MCV 92.7 90.3  PLT 237 209    Cardiac Enzymes: No results for input(s): CKTOTAL, CKMB, CKMBINDEX, TROPONINI in the last 168 hours.  BNP (last 3 results) Recent Labs    08/24/18 1409  BNP 678.0*    ProBNP (last 3 results) No results for input(s): PROBNP in the last 8760 hours.  Radiological Exams: No results  found.  Assessment/Plan Active Problems:   Acute on chronic respiratory failure with hypoxia (HCC)   COPD, severe (HCC)   Pulmonary arterial hypertension (HCC)   Non-STEMI (non-ST elevated myocardial infarction) ([place])   Embolic stroke involving right middle cerebral artery (HCC)   Altered mental status   1. Acute on chronic respiratory failure with hypoxia we will continue with supportive care oxygen as necessary patient has been successfully decannulated. 2. COPD severe disease continue with present management 3. Pulmonary hypertension at baseline oxygen as necessary 4. Non-STEMI no active chest pain 5. Embolic stroke patient is at baseline therapy as tolerated 6. Altered mental status he is back to his baseline now   I have personally seen and evaluated the patient, evaluated laboratory and imaging results, formulated the assessment and plan and placed orders. The Patient requires high complexity decision making for assessment and support.  Case was discussed on Rounds with the Respiratory Therapy Staff  Allyne Gee, MD [facility] Pulmonary Critical Care Medicine Sleep Medicine

## 2018-10-03 LAB — CBC
HCT: 37.9 % — ABNORMAL LOW (ref 39.0–52.0)
HEMOGLOBIN: 12.3 g/dL — AB (ref 13.0–17.0)
MCH: 29.4 pg (ref 26.0–34.0)
MCHC: 32.5 g/dL (ref 30.0–36.0)
MCV: 90.7 fL (ref 80.0–100.0)
Platelets: 223 10*3/uL (ref 150–400)
RBC: 4.18 MIL/uL — ABNORMAL LOW (ref 4.22–5.81)
RDW: 13.8 % (ref 11.5–15.5)
WBC: 10.1 10*3/uL (ref 4.0–10.5)
nRBC: 0 % (ref 0.0–0.2)

## 2018-11-30 ENCOUNTER — Other Ambulatory Visit: Payer: Self-pay

## 2018-11-30 NOTE — Patient Outreach (Signed)
First attempt to obtain mRs. No answer. Mailbox was full. No DPR on file.

## 2018-12-02 ENCOUNTER — Other Ambulatory Visit: Payer: Self-pay

## 2018-12-02 NOTE — Patient Outreach (Signed)
Second attempt at mRs score. Went straight to [employer] which was full.

## 2018-12-04 ENCOUNTER — Other Ambulatory Visit: Payer: Self-pay

## 2018-12-04 ENCOUNTER — Encounter: Payer: Self-pay | Admitting: Vascular Surgery

## 2018-12-04 ENCOUNTER — Ambulatory Visit (INDEPENDENT_AMBULATORY_CARE_PROVIDER_SITE_OTHER)
Admission: RE | Admit: 2018-12-04 | Discharge: 2018-12-04 | Disposition: A | Discharge status: Home / Self Care | Payer: Medicare Other | Source: Ambulatory Visit | Attending: Vascular Surgery | Admitting: Vascular Surgery

## 2018-12-04 ENCOUNTER — Ambulatory Visit (INDEPENDENT_AMBULATORY_CARE_PROVIDER_SITE_OTHER): Payer: Medicare Other | Admitting: Vascular Surgery

## 2018-12-04 ENCOUNTER — Other Ambulatory Visit: Payer: Self-pay | Admitting: Vascular Surgery

## 2018-12-04 ENCOUNTER — Other Ambulatory Visit (HOSPITAL_COMMUNITY): Payer: Self-pay | Admitting: Internal Medicine

## 2018-12-04 VITALS — BP 139/81 | HR 90 | Temp 97.9°F | Resp 20 | Ht 72.0 in | Wt 200.0 lb

## 2018-12-04 DIAGNOSIS — I739 Peripheral vascular disease, unspecified: Secondary | ICD-10-CM

## 2018-12-04 DIAGNOSIS — L97929 Non-pressure chronic ulcer of unspecified part of left lower leg with unspecified severity: Secondary | ICD-10-CM

## 2018-12-04 DIAGNOSIS — L97919 Non-pressure chronic ulcer of unspecified part of right lower leg with unspecified severity: Secondary | ICD-10-CM | POA: Diagnosis not present

## 2018-12-04 NOTE — Progress Notes (Signed)
Patient ID: Clayton [name], male   DOB: Apr 13, 1951, 68 y.o.   MRN: [id number]  Reason for Consult: New Patient (Initial Visit)   Referred by Dione Housekeeper, MD  Subjective:     HPI:  Clayton [name] is a 68 y.o. male patient history of stroke with left-sided weakness mostly in his arm.  He is now on nursing facility.  He has had a right foot ulceration and is unfortunately worsened in the recent past.  Does not have any fevers overall feels fine.  He walks minimally with assistance.  Also has toe ulceration on the left second toe.  Denies any history of vascular interventions in the past.  Past Medical History:  Diagnosis Date  . Acute on chronic respiratory failure with hypoxia ([place])   . Altered mental status   . CAD (coronary artery disease)    a. s/p BMS x 3 to RCA in 2009  . COPD (chronic obstructive pulmonary disease) ([place])   . COPD, severe ([place])   . Diabetes mellitus   . Embolic stroke involving right middle cerebral artery ([place])   . GERD (gastroesophageal reflux disease)   . Guaiac positive stools 07/03/2018  . HTN (hypertension)   . Hyperlipidemia   . Myocardial infarction ([place]) 2009  . Non-STEMI (non-ST elevated myocardial infarction) ([place])   . Prostate cancer ([place])   . Pulmonary arterial hypertension ([place])   . PVD (peripheral vascular disease) (HCC)    Family History  Problem Relation Age of Onset  . Coronary artery disease Mother   . Coronary artery disease Father   . Prostate cancer Father    Past Surgical History:  Procedure Laterality Date  . CARDIAC CATHETERIZATION     with stent  . cardiac stents  2009  . CATARACT EXTRACTION W/PHACO Left 08/19/2013   Procedure: CATARACT EXTRACTION PHACO AND INTRAOCULAR LENS PLACEMENT (IOC);  Surgeon: Tonny Branch, MD;  Location: [facility];  Service: Ophthalmology;  Laterality: Left;  CDE:37.77  . CATARACT EXTRACTION W/PHACO Right 07/29/2016   Procedure: CATARACT EXTRACTION PHACO AND INTRAOCULAR LENS PLACEMENT RIGHT EYE; CDE:   18.10;  Surgeon: Tonny Branch, MD;  Location: [facility];  Service: Ophthalmology;  Laterality: Right;  . IR CT HEAD LTD  08/28/2018  . IR PERCUTANEOUS ART THROMBECTOMY/INFUSION INTRACRANIAL INC DIAG ANGIO  08/28/2018  . RADIOLOGY WITH ANESTHESIA N/A 08/28/2018   Procedure: IR WITH ANESTHESIA;  Surgeon: Radiologist, Medication, MD;  Location: [facility];  Service: Radiology;  Laterality: N/A;    Short Social History:  Social History   Tobacco Use  . Smoking status: Former Smoker    Packs/day: 1.00    Years: 42.00    Pack years: 42.00    Types: Cigarettes    Start date: 07/07/1969  . Smokeless tobacco: Never Used  Substance Use Topics  . Alcohol use: Yes    Alcohol/week: 0.0 standard drinks    Comment: rarely    No Known Allergies  Current Outpatient Medications  Medication Sig Dispense Refill  . acetaminophen (TYLENOL) 160 MG/5ML solution Place 20.3 mLs (650 mg total) into feeding tube every 4 (four) hours as needed for mild pain (or temp > 37.5 C (99.5 F)). 120 mL 0  . Amino Acids-Protein Hydrolys (FEEDING SUPPLEMENT, PRO-STAT SUGAR FREE 64,) LIQD Place 30 mLs into feeding tube daily. 900 mL 0  . aspirin 81 MG chewable tablet Place 1 tablet (81 mg total) into feeding tube daily.    . bisacodyl (DULCOLAX) 10 MG suppository Place 1 suppository (10 mg total) rectally  daily as needed for severe constipation. 12 suppository 0  . carvedilol (COREG) 6.25 MG tablet Place 1 tablet (6.25 mg total) into feeding tube 2 (two) times daily with a meal.    . chlorhexidine gluconate, MEDLINE KIT, (PERIDEX) 0.12 % solution 15 mLs by Mouth Rinse route 2 (two) times daily. 120 mL 0  . docusate (COLACE) 50 MG/5ML liquid Take 10 mLs (100 mg total) by mouth daily. 100 mL 0  . guaiFENesin (ROBITUSSIN) 100 MG/5ML SOLN Take 15 mLs (300 mg total) by mouth every 6 (six) hours as needed for cough or to loosen phlegm. 1200 mL 0  . insulin aspart (NOVOLOG) 100 UNIT/ML injection Inject 0-20 Units into the skin every 4  (four) hours. 10 mL 11  . insulin glargine (LANTUS) 100 UNIT/ML injection Inject 0.2 mLs (20 Units total) into the skin every morning. 10 mL 11  . ipratropium-albuterol (DUONEB) 0.5-2.5 (3) MG/3ML SOLN Take 3 mLs by nebulization every 6 (six) hours as needed. 360 mL   . metFORMIN (GLUCOPHAGE) 1000 MG tablet TAKE ONE TABLET (1,000 MG TOTAL) BY MOUTH 2 (TWO) TIMES DAILY WITH MEALS.    Marland Kitchen Nutritional Supplements (FEEDING SUPPLEMENT, VITAL AF 1.2 CAL,) LIQD Place 1,000 mLs into feeding tube continuous.    . ondansetron (ZOFRAN) 4 MG/2ML SOLN injection Inject 2 mLs (4 mg total) into the vein every 6 (six) hours as needed for nausea or vomiting. 2 mL 0  . pantoprazole sodium (PROTONIX) 40 mg/20 mL PACK Place 20 mLs (40 mg total) into feeding tube daily. 30 each   . polyethylene glycol (MIRALAX / GLYCOLAX) packet Take 17 g by mouth daily as needed for mild constipation. 14 each 0   No current facility-administered medications for this visit.     Review of Systems  Constitutional:  Constitutional negative. Cardiovascular: Cardiovascular negative.  GI: Gastrointestinal negative.  Musculoskeletal: Musculoskeletal negative.  Skin: Positive for wound.  Neurological: Positive for focal weakness.  Psychiatric: Positive for confusion.        Objective:  Objective   Vitals:   12/04/18 0930  BP: 139/81  Pulse: 90  Resp: 20  Temp: 97.9 F (36.6 C)  SpO2: 100%  Weight: 200 lb (90.7 kg)  Height: 6' (1.829 m)   Body mass index is 27.12 kg/m.  Physical Exam HENT:     Head: Normocephalic.  Eyes:     Pupils: Pupils are equal, round, and reactive to light.  Cardiovascular:     Pulses:          Radial pulses are 2+ on the right side and 2+ on the left side.       Femoral pulses are 0 on the right side and 0 on the left side.      Popliteal pulses are 0 on the right side and 0 on the left side.  Pulmonary:     Effort: Pulmonary effort is normal.  Abdominal:     General: Abdomen is flat.      Palpations: Abdomen is soft. There is no mass.  Musculoskeletal:     Comments: Significant soft tissue swelling and eschar right foot Left second toe with dry ulceration  Neurological:     General: No focal deficit present.     Mental Status: He is alert.  Psychiatric:        Mood and Affect: Mood normal.        Behavior: Behavior normal.        Thought Content: Thought content normal.  Judgment: Judgment normal.     Data: I have independently interpreted his ABIs to be 0.54 right and 0.64 left     Assessment/Plan:     68 year old male with moderately depressed ABIs bilaterally.  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  I do unfortunately think that his wound on his right foot is too extensive for salvage will likely require right above-knee amputation.  We will start with angiography from the left common femoral approach to look at the right lower extremity in the event that this could be stented he could undergo partial foot amputation.  We will plan amputation the following day.  There is no intervention that can be undertaken on the right lower extremity vasculature we could consider left lower extremity stenting in an effort to salvage his left lower extremity.  I discussed with them the urgent nature of this as well as the likelihood of right lower extremity amputation and they demonstrate good understanding we will get him scheduled today.     Waynetta Sandy MD [facility]

## 2018-12-07 ENCOUNTER — Other Ambulatory Visit: Payer: Self-pay

## 2018-12-07 ENCOUNTER — Encounter (HOSPITAL_COMMUNITY): Payer: Self-pay | Admitting: Vascular Surgery

## 2018-12-07 ENCOUNTER — Inpatient Hospital Stay (HOSPITAL_COMMUNITY): Payer: Medicare Other

## 2018-12-07 ENCOUNTER — Inpatient Hospital Stay (HOSPITAL_COMMUNITY)
Admission: RE | Admit: 2018-12-07 | Discharge: 2018-12-11 | DRG: 240 | Disposition: A | Discharge status: Skilled Nursing Facility | Payer: Medicare Other | Source: Skilled Nursing Facility | Attending: Vascular Surgery | Admitting: Vascular Surgery

## 2018-12-07 ENCOUNTER — Encounter (HOSPITAL_COMMUNITY)
Admission: RE | Disposition: A | Discharge status: Skilled Nursing Facility | Payer: Self-pay | Source: Skilled Nursing Facility | Attending: Vascular Surgery

## 2018-12-07 DIAGNOSIS — L97529 Non-pressure chronic ulcer of other part of left foot with unspecified severity: Secondary | ICD-10-CM | POA: Diagnosis present

## 2018-12-07 DIAGNOSIS — Z955 Presence of coronary angioplasty implant and graft: Secondary | ICD-10-CM

## 2018-12-07 DIAGNOSIS — Z8546 Personal history of malignant neoplasm of prostate: Secondary | ICD-10-CM | POA: Diagnosis not present

## 2018-12-07 DIAGNOSIS — I2721 Secondary pulmonary arterial hypertension: Secondary | ICD-10-CM | POA: Diagnosis present

## 2018-12-07 DIAGNOSIS — E11621 Type 2 diabetes mellitus with foot ulcer: Secondary | ICD-10-CM | POA: Diagnosis present

## 2018-12-07 DIAGNOSIS — Z87891 Personal history of nicotine dependence: Secondary | ICD-10-CM | POA: Diagnosis not present

## 2018-12-07 DIAGNOSIS — L97429 Non-pressure chronic ulcer of left heel and midfoot with unspecified severity: Secondary | ICD-10-CM | POA: Diagnosis present

## 2018-12-07 DIAGNOSIS — I96 Gangrene, not elsewhere classified: Secondary | ICD-10-CM | POA: Diagnosis present

## 2018-12-07 DIAGNOSIS — E785 Hyperlipidemia, unspecified: Secondary | ICD-10-CM | POA: Diagnosis present

## 2018-12-07 DIAGNOSIS — I2510 Atherosclerotic heart disease of native coronary artery without angina pectoris: Secondary | ICD-10-CM | POA: Diagnosis present

## 2018-12-07 DIAGNOSIS — I252 Old myocardial infarction: Secondary | ICD-10-CM

## 2018-12-07 DIAGNOSIS — Z01810 Encounter for preprocedural cardiovascular examination: Secondary | ICD-10-CM | POA: Diagnosis not present

## 2018-12-07 DIAGNOSIS — J449 Chronic obstructive pulmonary disease, unspecified: Secondary | ICD-10-CM | POA: Diagnosis present

## 2018-12-07 DIAGNOSIS — Z794 Long term (current) use of insulin: Secondary | ICD-10-CM

## 2018-12-07 DIAGNOSIS — I10 Essential (primary) hypertension: Secondary | ICD-10-CM | POA: Diagnosis present

## 2018-12-07 DIAGNOSIS — E1152 Type 2 diabetes mellitus with diabetic peripheral angiopathy with gangrene: Principal | ICD-10-CM | POA: Diagnosis present

## 2018-12-07 DIAGNOSIS — I472 Ventricular tachycardia: Secondary | ICD-10-CM | POA: Diagnosis not present

## 2018-12-07 DIAGNOSIS — I739 Peripheral vascular disease, unspecified: Secondary | ICD-10-CM | POA: Diagnosis present

## 2018-12-07 DIAGNOSIS — I998 Other disorder of circulatory system: Secondary | ICD-10-CM | POA: Diagnosis present

## 2018-12-07 DIAGNOSIS — L97919 Non-pressure chronic ulcer of unspecified part of right lower leg with unspecified severity: Secondary | ICD-10-CM

## 2018-12-07 DIAGNOSIS — Z8673 Personal history of transient ischemic attack (TIA), and cerebral infarction without residual deficits: Secondary | ICD-10-CM

## 2018-12-07 DIAGNOSIS — K219 Gastro-esophageal reflux disease without esophagitis: Secondary | ICD-10-CM | POA: Diagnosis present

## 2018-12-07 HISTORY — PX: LOWER EXTREMITY ANGIOGRAPHY: CATH118251

## 2018-12-07 HISTORY — PX: PERIPHERAL VASCULAR INTERVENTION: CATH118257

## 2018-12-07 LAB — GLUCOSE, CAPILLARY
GLUCOSE-CAPILLARY: 158 mg/dL — AB (ref 70–99)
Glucose-Capillary: 120 mg/dL — ABNORMAL HIGH (ref 70–99)
Glucose-Capillary: 122 mg/dL — ABNORMAL HIGH (ref 70–99)
Glucose-Capillary: 191 mg/dL — ABNORMAL HIGH (ref 70–99)

## 2018-12-07 LAB — POCT I-STAT 4, (NA,K, GLUC, HGB,HCT)
GLUCOSE: 143 mg/dL — AB (ref 70–99)
HCT: 26 % — ABNORMAL LOW (ref 39.0–52.0)
Hemoglobin: 8.8 g/dL — ABNORMAL LOW (ref 13.0–17.0)
Potassium: 3.8 mmol/L (ref 3.5–5.1)
Sodium: 141 mmol/L (ref 135–145)

## 2018-12-07 LAB — POCT I-STAT CREATININE: Creatinine, Ser: 0.4 mg/dL — ABNORMAL LOW (ref 0.61–1.24)

## 2018-12-07 LAB — CBC
HEMATOCRIT: 26.5 % — AB (ref 39.0–52.0)
Hemoglobin: 8.3 g/dL — ABNORMAL LOW (ref 13.0–17.0)
MCH: 31.4 pg (ref 26.0–34.0)
MCHC: 31.3 g/dL (ref 30.0–36.0)
MCV: 100.4 fL — ABNORMAL HIGH (ref 80.0–100.0)
Platelets: 332 10*3/uL (ref 150–400)
RBC: 2.64 MIL/uL — ABNORMAL LOW (ref 4.22–5.81)
RDW: 13.1 % (ref 11.5–15.5)
WBC: 8.7 10*3/uL (ref 4.0–10.5)
nRBC: 0 % (ref 0.0–0.2)

## 2018-12-07 LAB — CREATININE, SERUM
Creatinine, Ser: 0.53 mg/dL — ABNORMAL LOW (ref 0.61–1.24)
GFR calc Af Amer: >60 mL/min (ref >60)
GFR calc non Af Amer: >60 mL/min (ref >60)

## 2018-12-07 SURGERY — LOWER EXTREMITY ANGIOGRAPHY
Anesthesia: LOCAL | Laterality: Left

## 2018-12-07 MED ORDER — SODIUM CHLORIDE 0.9 % IV SOLN
INTRAVENOUS | Status: DC
  Administered 2018-12-07: 07:00:00 via INTRAVENOUS

## 2018-12-07 MED ORDER — MORPHINE SULFATE (PF) 2 MG/ML IV SOLN: 2.0000 mg | INTRAVENOUS | Status: DC | PRN

## 2018-12-07 MED ORDER — LABETALOL HCL 5 MG/ML IV SOLN
INTRAVENOUS | Status: AC
  Filled 2018-12-07: qty 4

## 2018-12-07 MED ORDER — POLYETHYLENE GLYCOL 3350 17 G PO PACK: 17.0000 g | PACK | Freq: Every day | ORAL | Status: DC | PRN

## 2018-12-07 MED ORDER — PRO-STAT SUGAR FREE PO LIQD: 30.0000 mL | Freq: Every day | ORAL | Status: DC

## 2018-12-07 MED ORDER — PREDNISONE 10 MG PO TABS
10.0000 mg | ORAL_TABLET | Freq: Every day | ORAL | Status: DC
  Administered 2018-12-09 – 2018-12-11 (×3): 10 mg via ORAL
  Filled 2018-12-07 (×3): qty 1

## 2018-12-07 MED ORDER — INSULIN ASPART 100 UNIT/ML ~~LOC~~ SOLN
4.0000 [IU] | Freq: Three times a day (TID) | SUBCUTANEOUS | Status: DC
  Administered 2018-12-07 – 2018-12-11 (×9): 4 [IU] via SUBCUTANEOUS

## 2018-12-07 MED ORDER — CARVEDILOL 6.25 MG PO TABS: 6.2500 mg | ORAL_TABLET | Freq: Two times a day (BID) | ORAL | Status: DC

## 2018-12-07 MED ORDER — PANTOPRAZOLE SODIUM 40 MG PO PACK
40.0000 mg | PACK | Freq: Every day | ORAL | Status: DC
  Filled 2018-12-07: qty 20

## 2018-12-07 MED ORDER — ATORVASTATIN CALCIUM 40 MG PO TABS
40.0000 mg | ORAL_TABLET | Freq: Every evening | ORAL | Status: DC
  Administered 2018-12-07 – 2018-12-10 (×4): 40 mg via ORAL
  Filled 2018-12-07 (×4): qty 1

## 2018-12-07 MED ORDER — PANTOPRAZOLE SODIUM 40 MG PO TBEC
40.0000 mg | DELAYED_RELEASE_TABLET | Freq: Every day | ORAL | Status: DC
  Administered 2018-12-07 – 2018-12-11 (×4): 40 mg via ORAL
  Filled 2018-12-07 (×5): qty 1

## 2018-12-07 MED ORDER — GUAIFENESIN 100 MG/5ML PO SOLN: 15.0000 mL | Freq: Four times a day (QID) | ORAL | Status: DC | PRN

## 2018-12-07 MED ORDER — INSULIN ASPART 100 UNIT/ML ~~LOC~~ SOLN: 0.0000 [IU] | Freq: Every day | SUBCUTANEOUS | Status: DC

## 2018-12-07 MED ORDER — ACETAMINOPHEN 325 MG PO TABS: 650.0000 mg | ORAL_TABLET | ORAL | Status: DC | PRN

## 2018-12-07 MED ORDER — SODIUM CHLORIDE 0.9% FLUSH
3.0000 mL | Freq: Two times a day (BID) | INTRAVENOUS | Status: DC
  Administered 2018-12-07 – 2018-12-11 (×6): 3 mL via INTRAVENOUS

## 2018-12-07 MED ORDER — OXYCODONE HCL 5 MG PO TABS
5.0000 mg | ORAL_TABLET | ORAL | Status: DC | PRN
  Administered 2018-12-07: 5 mg via ORAL
  Administered 2018-12-10: 10 mg via ORAL
  Filled 2018-12-07: qty 2
  Filled 2018-12-07: qty 1

## 2018-12-07 MED ORDER — INSULIN ASPART 100 UNIT/ML ~~LOC~~ SOLN
0.0000 [IU] | Freq: Three times a day (TID) | SUBCUTANEOUS | Status: DC
  Administered 2018-12-07 – 2018-12-09 (×5): 3 [IU] via SUBCUTANEOUS
  Administered 2018-12-09: 15 [IU] via SUBCUTANEOUS
  Administered 2018-12-09 – 2018-12-11 (×6): 3 [IU] via SUBCUTANEOUS

## 2018-12-07 MED ORDER — BISACODYL 10 MG RE SUPP: 10.0000 mg | Freq: Every day | RECTAL | Status: DC | PRN

## 2018-12-07 MED ORDER — LIDOCAINE HCL (PF) 1 % IJ SOLN
INTRAMUSCULAR | Status: DC | PRN
  Administered 2018-12-07: 15 mL via INTRADERMAL

## 2018-12-07 MED ORDER — VITAL AF 1.2 CAL PO LIQD: 1000.0000 mL | ORAL | Status: DC

## 2018-12-07 MED ORDER — GUAIFENESIN ER 600 MG PO TB12: 600.0000 mg | ORAL_TABLET | Freq: Four times a day (QID) | ORAL | Status: DC | PRN

## 2018-12-07 MED ORDER — HEPARIN SODIUM (PORCINE) 1000 UNIT/ML IJ SOLN
INTRAMUSCULAR | Status: AC
  Filled 2018-12-07: qty 1

## 2018-12-07 MED ORDER — HEPARIN SODIUM (PORCINE) 5000 UNIT/ML IJ SOLN
5000.0000 [IU] | Freq: Three times a day (TID) | INTRAMUSCULAR | Status: DC
  Administered 2018-12-07 – 2018-12-08 (×2): 5000 [IU] via SUBCUTANEOUS
  Filled 2018-12-07 (×2): qty 1

## 2018-12-07 MED ORDER — HEPARIN (PORCINE) IN NACL 1000-0.9 UT/500ML-% IV SOLN
INTRAVENOUS | Status: DC | PRN
  Administered 2018-12-07 (×2): 500 mL

## 2018-12-07 MED ORDER — DOCUSATE SODIUM 50 MG/5ML PO LIQD
100.0000 mg | Freq: Every day | ORAL | Status: DC
  Administered 2018-12-09 – 2018-12-11 (×3): 100 mg via ORAL
  Filled 2018-12-07 (×3): qty 10

## 2018-12-07 MED ORDER — LABETALOL HCL 5 MG/ML IV SOLN: 10.0000 mg | INTRAVENOUS | Status: DC | PRN

## 2018-12-07 MED ORDER — CARVEDILOL 6.25 MG PO TABS
6.2500 mg | ORAL_TABLET | Freq: Two times a day (BID) | ORAL | Status: DC
  Administered 2018-12-07 – 2018-12-11 (×7): 6.25 mg via ORAL
  Filled 2018-12-07 (×7): qty 1

## 2018-12-07 MED ORDER — SODIUM CHLORIDE 0.9% FLUSH: 3.0000 mL | INTRAVENOUS | Status: DC | PRN

## 2018-12-07 MED ORDER — LABETALOL HCL 5 MG/ML IV SOLN
INTRAVENOUS | Status: DC | PRN
  Administered 2018-12-07: 20 mg via INTRAVENOUS

## 2018-12-07 MED ORDER — ONDANSETRON HCL 4 MG/2ML IJ SOLN: 4.0000 mg | Freq: Four times a day (QID) | INTRAMUSCULAR | Status: DC | PRN

## 2018-12-07 MED ORDER — ASPIRIN 81 MG PO CHEW: 81.0000 mg | CHEWABLE_TABLET | Freq: Every day | ORAL | Status: DC

## 2018-12-07 MED ORDER — HEPARIN SODIUM (PORCINE) 1000 UNIT/ML IJ SOLN
INTRAMUSCULAR | Status: DC | PRN
  Administered 2018-12-07: 10000 [IU] via INTRAVENOUS

## 2018-12-07 MED ORDER — DOCUSATE SODIUM 50 MG/5ML PO LIQD: 100.0000 mg | Freq: Every day | ORAL | Status: DC

## 2018-12-07 MED ORDER — SODIUM CHLORIDE 0.9 % IV SOLN
250.0000 mL | INTRAVENOUS | Status: DC | PRN
  Administered 2018-12-08: 07:00:00 via INTRAVENOUS

## 2018-12-07 MED ORDER — ASPIRIN 81 MG PO CHEW
81.0000 mg | CHEWABLE_TABLET | Freq: Every day | ORAL | Status: DC
  Administered 2018-12-08 – 2018-12-11 (×4): 81 mg via ORAL
  Filled 2018-12-07 (×5): qty 1

## 2018-12-07 MED ORDER — LIDOCAINE HCL (PF) 1 % IJ SOLN
INTRAMUSCULAR | Status: AC
  Filled 2018-12-07: qty 30

## 2018-12-07 MED ORDER — HEPARIN (PORCINE) IN NACL 1000-0.9 UT/500ML-% IV SOLN
INTRAVENOUS | Status: AC
  Filled 2018-12-07: qty 1000

## 2018-12-07 MED ORDER — HYDRALAZINE HCL 20 MG/ML IJ SOLN: 5.0000 mg | INTRAMUSCULAR | Status: DC | PRN

## 2018-12-07 MED ORDER — SODIUM CHLORIDE 0.9 % IV SOLN
INTRAVENOUS | Status: AC
  Administered 2018-12-07: 15:00:00 via INTRAVENOUS

## 2018-12-07 MED ORDER — ACETAMINOPHEN 325 MG PO TABS
650.0000 mg | ORAL_TABLET | Freq: Four times a day (QID) | ORAL | Status: DC | PRN
  Administered 2018-12-07: 650 mg via ORAL
  Filled 2018-12-07: qty 2

## 2018-12-07 SURGICAL SUPPLY — 16 items
CATH OMNI FLUSH 5F 65CM (CATHETERS) ×1 IMPLANT
CATH QUICKCROSS SUPP .035X90CM (MICROCATHETER) ×1 IMPLANT
CLOSURE MYNX CONTROL 6F/7F (Vascular Products) ×1 IMPLANT
DEVICE TORQUE .025-.038 (MISCELLANEOUS) ×1 IMPLANT
GUIDEWIRE ANGLED .035X260CM (WIRE) ×1 IMPLANT
KIT MICROPUNCTURE NIT STIFF (SHEATH) ×2 IMPLANT
KIT PV (KITS) ×3 IMPLANT
SHEATH FLEXOR ANSEL 1 7F 45CM (SHEATH) ×1 IMPLANT
SHEATH PINNACLE 5F 10CM (SHEATH) ×1 IMPLANT
SHEATH PINNACLE 7F 10CM (SHEATH) ×1 IMPLANT
SHEATH PROBE COVER 6X72 (BAG) ×1 IMPLANT
SYR MEDRAD MARK V 150ML (SYRINGE) ×1 IMPLANT
TRANSDUCER W/STOPCOCK (MISCELLANEOUS) ×3 IMPLANT
TRAY PV CATH (CUSTOM PROCEDURE TRAY) ×3 IMPLANT
WIRE BENTSON .035X145CM (WIRE) ×1 IMPLANT
WIRE G V18X300CM (WIRE) ×1 IMPLANT

## 2018-12-07 NOTE — H&P (Signed)
   History and Physical Update  The patient was interviewed and re-examined.  The patient's previous History and Physical has been reviewed and is unchanged from recent office visit. Plan for angiogram with possible right or left intervention. On the schedule for right sided amputation tomorrow.   Brandon C. Donzetta Matters, MD [facility] [place] Office: [phone] Pager: [phone]  12/07/2018, 8:08 AM

## 2018-12-07 NOTE — Progress Notes (Signed)
VASCULAR LAB PRELIMINARY  PRELIMINARY  PRELIMINARY  PRELIMINARY  Bilateral lower extremity vein mapping completed.    Preliminary report:  See CV proc for results  Yaman Grauberger, RVT 12/07/2018, 11:48 AM

## 2018-12-07 NOTE — Op Note (Signed)
    Patient name: Clayton [name] MRN: [id number] DOB: Jul 21, 1951 Sex: male  12/07/2018 Pre-operative Diagnosis: Critical bilateral lower extremity ischemia Post-operative diagnosis:  Same Surgeon:  Erlene Quan C. Donzetta Matters, MD Procedure Performed: 1.  Ultrasound-guided cannulation right common femoral artery 2.  Aortogram with bilateral lower extremity runoff 3.  Attempted left lower extremity revascularization with cannulation of left superficial femoral artery 4.  Minx device closure right common femoral artery  Indications: 68 year old male with recent right MCA stroke has been in a nursing home developed significant right foot tissue loss.  He also has ulceration on his left second toe as well as small on the heel.  He is indicated for angiogram possible either leg intervention.  Findings: Right lower extremity has very long segment SFA occlusion appears to have three-vessel runoff.  I do not think that intervening here will save his significant right foot ulceration.  On the left side he has long segment SFA occlusion.  I attempted to cross but could not.  Patient will be considered for left common femoral to above-knee below-knee artery bypass after he heals his above-knee amputation on the right.   Procedure:  The patient was identified in the holding area and taken to room 8.  The patient was then placed supine on the table and prepped and draped in the usual sterile fashion.  A time out was called.  Ultrasound was used to evaluate the right common femoral artery which was noted to be patent and compressible.  He was anesthetized 1% lidocaine cannulated with direct ultrasound visualization with micropuncture needle followed by wire sheath.  Bentson wire was placed followed by 5 French sheath and Omni catheter level of L1.  Aortogram was performed followed by bilateral extremity runoff.  We then crossed the bifurcation perform angled views of the left common femoral artery elected to attempt  intervention.  We then placed 7 French sheath up and over the bifurcation and fully heparinized the patient.  We then use Glidewire quick cross catheter to attempt to cross the occluded SFA.  We also use V 18 wire but unfortunately perforation.  I could not reenter the true lumen.  We then removed our catheter wire exchanged for a short 7 French sheath and deployed a minx device.  Patient will be considered for left femoropopliteal bypass after he recovers from right above-knee amputation.  He tolerated procedure without immediate complication.  Contrast: 115cc   Zakiyah Diop C. Donzetta Matters, MD [facility] [place] Office: [phone] Pager: [phone]

## 2018-12-08 ENCOUNTER — Encounter (HOSPITAL_COMMUNITY)
Admission: RE | Disposition: A | Discharge status: Skilled Nursing Facility | Payer: Self-pay | Source: Skilled Nursing Facility | Attending: Vascular Surgery

## 2018-12-08 ENCOUNTER — Inpatient Hospital Stay (HOSPITAL_COMMUNITY): Payer: Medicare Other

## 2018-12-08 DIAGNOSIS — I739 Peripheral vascular disease, unspecified: Secondary | ICD-10-CM

## 2018-12-08 DIAGNOSIS — L97919 Non-pressure chronic ulcer of unspecified part of right lower leg with unspecified severity: Secondary | ICD-10-CM

## 2018-12-08 HISTORY — PX: AMPUTATION: SHX166

## 2018-12-08 LAB — URINALYSIS, ROUTINE W REFLEX MICROSCOPIC
Bilirubin Urine: NEGATIVE
GLUCOSE, UA: 50 mg/dL — AB
HGB URINE DIPSTICK: NEGATIVE
Ketones, ur: NEGATIVE mg/dL
Leukocytes,Ua: NEGATIVE
Nitrite: NEGATIVE
Protein, ur: NEGATIVE mg/dL
Specific Gravity, Urine: 1.03 (ref 1.005–1.030)
pH: 5 (ref 5.0–8.0)

## 2018-12-08 LAB — CBC
HCT: 23.2 % — ABNORMAL LOW (ref 39.0–52.0)
Hemoglobin: 7.1 g/dL — ABNORMAL LOW (ref 13.0–17.0)
MCH: 30.5 pg (ref 26.0–34.0)
MCHC: 30.6 g/dL (ref 30.0–36.0)
MCV: 99.6 fL (ref 80.0–100.0)
Platelets: 288 10*3/uL (ref 150–400)
RBC: 2.33 MIL/uL — ABNORMAL LOW (ref 4.22–5.81)
RDW: 13.1 % (ref 11.5–15.5)
WBC: 9.2 10*3/uL (ref 4.0–10.5)
nRBC: 0 % (ref 0.0–0.2)

## 2018-12-08 LAB — BASIC METABOLIC PANEL
Anion gap: 8 (ref 5–15)
BUN: 7 mg/dL — ABNORMAL LOW (ref 8–23)
CO2: 28 mmol/L (ref 22–32)
Calcium: 7.6 mg/dL — ABNORMAL LOW (ref 8.9–10.3)
Chloride: 100 mmol/L (ref 98–111)
Creatinine, Ser: 0.66 mg/dL (ref 0.61–1.24)
GFR calc non Af Amer: >60 mL/min (ref >60)
Glucose, Bld: 249 mg/dL — ABNORMAL HIGH (ref 70–99)
Potassium: 3.6 mmol/L (ref 3.5–5.1)
Sodium: 136 mmol/L (ref 135–145)

## 2018-12-08 LAB — GLUCOSE, CAPILLARY
Glucose-Capillary: 188 mg/dL — ABNORMAL HIGH (ref 70–99)
Glucose-Capillary: 129 mg/dL — ABNORMAL HIGH (ref 70–99)
Glucose-Capillary: 157 mg/dL — ABNORMAL HIGH (ref 70–99)
Glucose-Capillary: 165 mg/dL — ABNORMAL HIGH (ref 70–99)
Glucose-Capillary: 190 mg/dL — ABNORMAL HIGH (ref 70–99)

## 2018-12-08 LAB — MRSA PCR SCREENING: MRSA BY PCR: POSITIVE — AB

## 2018-12-08 SURGERY — AMPUTATION, ABOVE KNEE
Anesthesia: General | Site: Leg Upper | Laterality: Right

## 2018-12-08 MED ORDER — CHLORHEXIDINE GLUCONATE CLOTH 2 % EX PADS: 6.0000 | MEDICATED_PAD | Freq: Once | CUTANEOUS | Status: AC

## 2018-12-08 MED ORDER — METOPROLOL TARTRATE 5 MG/5ML IV SOLN: 2.0000 mg | INTRAVENOUS | Status: DC | PRN

## 2018-12-08 MED ORDER — POTASSIUM CHLORIDE CRYS ER 20 MEQ PO TBCR: 20.0000 meq | EXTENDED_RELEASE_TABLET | Freq: Every day | ORAL | Status: DC | PRN

## 2018-12-08 MED ORDER — DEXAMETHASONE SODIUM PHOSPHATE 4 MG/ML IJ SOLN
INTRAMUSCULAR | Status: DC | PRN
  Administered 2018-12-08: 8 mg via INTRAVENOUS

## 2018-12-08 MED ORDER — PHENOL 1.4 % MT LIQD: 1.0000 | OROMUCOSAL | Status: DC | PRN

## 2018-12-08 MED ORDER — SODIUM CHLORIDE 0.9 % IV SOLN
INTRAVENOUS | Status: DC
  Administered 2018-12-08: 10:00:00 via INTRAVENOUS

## 2018-12-08 MED ORDER — PHENYLEPHRINE HCL 10 MG/ML IJ SOLN
INTRAMUSCULAR | Status: DC | PRN
  Administered 2018-12-08 (×2): 120 ug via INTRAVENOUS
  Administered 2018-12-08: 80 ug via INTRAVENOUS

## 2018-12-08 MED ORDER — PROPOFOL 10 MG/ML IV BOLUS
INTRAVENOUS | Status: AC
  Filled 2018-12-08: qty 20

## 2018-12-08 MED ORDER — ROCURONIUM BROMIDE 50 MG/5ML IV SOSY
PREFILLED_SYRINGE | INTRAVENOUS | Status: AC
  Filled 2018-12-08: qty 5

## 2018-12-08 MED ORDER — 0.9 % SODIUM CHLORIDE (POUR BTL) OPTIME
TOPICAL | Status: DC | PRN
  Administered 2018-12-08: 1000 mL

## 2018-12-08 MED ORDER — LIDOCAINE HCL (CARDIAC) PF 100 MG/5ML IV SOSY
PREFILLED_SYRINGE | INTRAVENOUS | Status: DC | PRN
  Administered 2018-12-08: 50 mg via INTRAVENOUS

## 2018-12-08 MED ORDER — SODIUM CHLORIDE 0.9 % IV SOLN
INTRAVENOUS | Status: DC | PRN
  Administered 2018-12-08: 45 ug/min via INTRAVENOUS

## 2018-12-08 MED ORDER — CHLORHEXIDINE GLUCONATE CLOTH 2 % EX PADS
6.0000 | MEDICATED_PAD | Freq: Every day | CUTANEOUS | Status: DC
  Administered 2018-12-08 – 2018-12-11 (×3): 6 via TOPICAL

## 2018-12-08 MED ORDER — MORPHINE SULFATE (PF) 2 MG/ML IV SOLN: 2.0000 mg | INTRAVENOUS | Status: AC | PRN

## 2018-12-08 MED ORDER — DEXAMETHASONE SODIUM PHOSPHATE 10 MG/ML IJ SOLN
INTRAMUSCULAR | Status: AC
  Filled 2018-12-08: qty 1

## 2018-12-08 MED ORDER — MAGNESIUM SULFATE 2 GM/50ML IV SOLN: 2.0000 g | Freq: Every day | INTRAVENOUS | Status: DC | PRN

## 2018-12-08 MED ORDER — SUCCINYLCHOLINE CHLORIDE 200 MG/10ML IV SOSY
PREFILLED_SYRINGE | INTRAVENOUS | Status: AC
  Filled 2018-12-08: qty 10

## 2018-12-08 MED ORDER — CEFAZOLIN SODIUM 1 G IJ SOLR
INTRAMUSCULAR | Status: AC
  Filled 2018-12-08: qty 20

## 2018-12-08 MED ORDER — CEFAZOLIN SODIUM-DEXTROSE 2-4 GM/100ML-% IV SOLN
2.0000 g | INTRAVENOUS | Status: AC
  Administered 2018-12-08: 2 g via INTRAVENOUS

## 2018-12-08 MED ORDER — LIDOCAINE 2% (20 MG/ML) 5 ML SYRINGE
INTRAMUSCULAR | Status: AC
  Filled 2018-12-08: qty 5

## 2018-12-08 MED ORDER — ONDANSETRON HCL 4 MG/2ML IJ SOLN
INTRAMUSCULAR | Status: DC | PRN
  Administered 2018-12-08: 4 mg via INTRAVENOUS

## 2018-12-08 MED ORDER — CEFAZOLIN SODIUM-DEXTROSE 2-4 GM/100ML-% IV SOLN
2.0000 g | Freq: Three times a day (TID) | INTRAVENOUS | Status: AC
  Administered 2018-12-08 (×2): 2 g via INTRAVENOUS
  Filled 2018-12-08 (×2): qty 100

## 2018-12-08 MED ORDER — HEPARIN SODIUM (PORCINE) 5000 UNIT/ML IJ SOLN
5000.0000 [IU] | Freq: Three times a day (TID) | INTRAMUSCULAR | Status: DC
  Administered 2018-12-08 – 2018-12-11 (×9): 5000 [IU] via SUBCUTANEOUS
  Filled 2018-12-08 (×8): qty 1

## 2018-12-08 MED ORDER — CHLORHEXIDINE GLUCONATE CLOTH 2 % EX PADS
6.0000 | MEDICATED_PAD | Freq: Once | CUTANEOUS | Status: AC
  Administered 2018-12-08: 6 via TOPICAL

## 2018-12-08 MED ORDER — FENTANYL CITRATE (PF) 100 MCG/2ML IJ SOLN
INTRAMUSCULAR | Status: DC | PRN
  Administered 2018-12-08: 100 ug via INTRAVENOUS
  Administered 2018-12-08: 50 ug via INTRAVENOUS

## 2018-12-08 MED ORDER — SUCCINYLCHOLINE CHLORIDE 20 MG/ML IJ SOLN
INTRAMUSCULAR | Status: DC | PRN
  Administered 2018-12-08: 80 mg via INTRAVENOUS

## 2018-12-08 MED ORDER — MUPIROCIN 2 % EX OINT
1.0000 "application " | TOPICAL_OINTMENT | Freq: Two times a day (BID) | CUTANEOUS | Status: DC
  Administered 2018-12-08 – 2018-12-11 (×6): 1 via NASAL
  Filled 2018-12-08: qty 22

## 2018-12-08 MED ORDER — ONDANSETRON HCL 4 MG/2ML IJ SOLN
INTRAMUSCULAR | Status: AC
  Filled 2018-12-08: qty 2

## 2018-12-08 MED ORDER — ALUM & MAG HYDROXIDE-SIMETH 200-200-20 MG/5ML PO SUSP: 15.0000 mL | ORAL | Status: DC | PRN

## 2018-12-08 MED ORDER — PROPOFOL 10 MG/ML IV BOLUS
INTRAVENOUS | Status: DC | PRN
  Administered 2018-12-08: 90 mg via INTRAVENOUS

## 2018-12-08 MED ORDER — MIDAZOLAM HCL 2 MG/2ML IJ SOLN
INTRAMUSCULAR | Status: AC
  Filled 2018-12-08: qty 2

## 2018-12-08 MED ORDER — LACTATED RINGERS IV SOLN
INTRAVENOUS | Status: DC | PRN
  Administered 2018-12-08: 09:00:00 via INTRAVENOUS

## 2018-12-08 MED ORDER — FENTANYL CITRATE (PF) 250 MCG/5ML IJ SOLN
INTRAMUSCULAR | Status: AC
  Filled 2018-12-08: qty 5

## 2018-12-08 SURGICAL SUPPLY — 62 items
BANDAGE ACE 4X5 VEL STRL LF (GAUZE/BANDAGES/DRESSINGS) ×4 IMPLANT
BANDAGE ACE 6X5 VEL STRL LF (GAUZE/BANDAGES/DRESSINGS) ×1 IMPLANT
BANDAGE ESMARK 6X9 LF (GAUZE/BANDAGES/DRESSINGS) ×1 IMPLANT
BLADE AVERAGE 25MMX9MM (BLADE)
BLADE AVERAGE 25X9 (BLADE) IMPLANT
BLADE SAGITTAL (BLADE)
BLADE SAGITTAL 25.0X1.19X90 (BLADE) IMPLANT
BLADE SAGITTAL 25.0X1.19X90MM (BLADE)
BLADE SAW GIGLI 510 (BLADE) ×3 IMPLANT
BLADE SAW GIGLI 510MM (BLADE) ×1
BLADE SAW SGTL 81X20 HD (BLADE) IMPLANT
BLADE SAW THK.89X75X18XSGTL (BLADE) IMPLANT
BNDG CMPR 75X41 PLY ABS (GAUZE/BANDAGES/DRESSINGS) ×2
BNDG CMPR 9X6 STRL LF SNTH (GAUZE/BANDAGES/DRESSINGS)
BNDG COHESIVE 6X5 TAN STRL LF (GAUZE/BANDAGES/DRESSINGS) ×4 IMPLANT
BNDG ESMARK 6X9 LF (GAUZE/BANDAGES/DRESSINGS)
BNDG GAUZE ELAST 4 BULKY (GAUZE/BANDAGES/DRESSINGS) ×4 IMPLANT
BNDG STRETCH 4X75 NS LF (GAUZE/BANDAGES/DRESSINGS) ×3 IMPLANT
BRUSH SCRUB EZ PLAIN DRY (MISCELLANEOUS) ×6 IMPLANT
CANISTER SUCT 3000ML PPV (MISCELLANEOUS) ×4 IMPLANT
CLIP VESOCCLUDE MED 6/CT (CLIP) ×4 IMPLANT
COVER SURGICAL LIGHT HANDLE (MISCELLANEOUS) ×4 IMPLANT
COVER WAND RF STERILE (DRAPES) ×4 IMPLANT
CUFF TOURNIQUET SINGLE 24IN (TOURNIQUET CUFF) IMPLANT
CUFF TOURNIQUET SINGLE 34IN LL (TOURNIQUET CUFF) IMPLANT
DRAIN CHANNEL 19F RND (DRAIN) IMPLANT
DRAPE EXTREMITY T 121X128X90 (DISPOSABLE) ×1 IMPLANT
DRAPE HALF SHEET 40X57 (DRAPES) ×4 IMPLANT
DRAPE ORTHO SPLIT 77X108 STRL (DRAPES) ×8
DRAPE SURG ORHT 6 SPLT 77X108 (DRAPES) ×4 IMPLANT
DRSG ADAPTIC 3X8 NADH LF (GAUZE/BANDAGES/DRESSINGS) ×4 IMPLANT
ELECT CAUTERY BLADE 6.4 (BLADE) ×4 IMPLANT
ELECT REM PT RETURN 9FT ADLT (ELECTROSURGICAL) ×4
ELECTRODE REM PT RTRN 9FT ADLT (ELECTROSURGICAL) ×2 IMPLANT
EVACUATOR SILICONE 100CC (DRAIN) IMPLANT
GAUZE SPONGE 4X4 12PLY STRL (GAUZE/BANDAGES/DRESSINGS) ×1 IMPLANT
GAUZE SPONGE 4X4 12PLY STRL LF (GAUZE/BANDAGES/DRESSINGS) ×3 IMPLANT
GLOVE BIO SURGEON STRL SZ7.5 (GLOVE) ×4 IMPLANT
GOWN STRL REUS W/ TWL LRG LVL3 (GOWN DISPOSABLE) ×4 IMPLANT
GOWN STRL REUS W/ TWL XL LVL3 (GOWN DISPOSABLE) ×2 IMPLANT
GOWN STRL REUS W/TWL LRG LVL3 (GOWN DISPOSABLE) ×8
GOWN STRL REUS W/TWL XL LVL3 (GOWN DISPOSABLE) ×4
KIT BASIN OR (CUSTOM PROCEDURE TRAY) ×4 IMPLANT
KIT TURNOVER KIT B (KITS) ×4 IMPLANT
NDL HYPO 25GX1X1/2 BEV (NEEDLE) IMPLANT
NEEDLE HYPO 25GX1X1/2 BEV (NEEDLE) IMPLANT
NS IRRIG 1000ML POUR BTL (IV SOLUTION) ×4 IMPLANT
PACK GENERAL/GYN (CUSTOM PROCEDURE TRAY) ×4 IMPLANT
PAD ARMBOARD 7.5X6 YLW CONV (MISCELLANEOUS) ×8 IMPLANT
STAPLER VISISTAT 35W (STAPLE) ×4 IMPLANT
STOCKINETTE IMPERVIOUS LG (DRAPES) ×4 IMPLANT
SUT ETHILON 3 0 PS 1 (SUTURE) ×4 IMPLANT
SUT SILK 0 TIES 10X30 (SUTURE) ×4 IMPLANT
SUT SILK 2 0 (SUTURE) ×4
SUT SILK 2-0 18XBRD TIE 12 (SUTURE) ×2 IMPLANT
SUT SILK 3 0 (SUTURE)
SUT SILK 3-0 18XBRD TIE 12 (SUTURE) IMPLANT
SUT VIC AB 2-0 CT1 18 (SUTURE) ×11 IMPLANT
SYR CONTROL 10ML LL (SYRINGE) IMPLANT
TOWEL GREEN STERILE (TOWEL DISPOSABLE) ×8 IMPLANT
UNDERPAD 30X30 (UNDERPADS AND DIAPERS) ×4 IMPLANT
WATER STERILE IRR 1000ML POUR (IV SOLUTION) ×4 IMPLANT

## 2018-12-08 NOTE — Progress Notes (Signed)
Sister Carmelina Peal called and given an update. All questions were answered.

## 2018-12-08 NOTE — Op Note (Signed)
    Patient name: Clayton [name] MRN: [id number] DOB: 12-Jan-1951 Sex: male  12/08/2018 Pre-operative Diagnosis: Critical right lower extremity ischemia with severe tissue loss Post-operative diagnosis:  Same Surgeon:  Eda Paschal. Donzetta Matters, MD Assistant: Arlee Muslim, PA Procedure Performed: Right above-knee amputation  Indications: 68 year old male presented with ischemic right foot does not have bilateral SFA occlusions.  He also has toe ulceration on the left second toe and small heel.  He is indicated for right above-the-knee amputation  Findings: There was bleeding tissue in the wound bed to suggest healing.   Procedure:  The patient was identified in the holding area and taken to the operating room where he is placed by operative table general anesthesia induced sterilely prepped draped in the right lower extremity given antibiotics and a timeout was called.  We began by exsanguinating the right lower extremity inflating tourniquet.  Tourniquet was inflated for total 5 minutes.  Fish[place] type incision was made above the knee.  We dissected down with cautery to the level of the bone elevator the periosteum.  Then transected the bone with Gigli saw.  Vein and artery were clamped divided.  Posterior flap was created.  I oversewed the vascular bundle with Vicryl stitch.  Let the tourniquet down.  We obtained hemostasis irrigated the wound.  The nerve was pulled on tension tied off and divided.  We then reapproximated the fascia with 3-0 Vicryl suture interrupted and then the skin was reapproximated with staples.  Sterile dressing was placed.  He was then awakened anesthesia having tolerated procedure without immediate complication.  EBL: 50 cc   Doctor Sheahan C. Donzetta Matters, MD [facility] Office: [phone] Pager: [phone]

## 2018-12-08 NOTE — Anesthesia Preprocedure Evaluation (Addendum)
Anesthesia Evaluation  Patient identified by MRN, date of birth, ID band Patient awake    Reviewed: Allergy & Precautions, NPO status , Patient's Chart, lab work & pertinent test results, reviewed documented beta blocker date and time   History of Anesthesia Complications Negative for: history of anesthetic complications  Airway Mallampati: III  TM Distance: >3 FB Neck ROM: Full    Dental  (+) Dental Advisory Given, Edentulous Upper, Edentulous Lower   Pulmonary former smoker,    breath sounds clear to auscultation       Cardiovascular hypertension, Pt. on medications and Pt. on home beta blockers + CAD, + Past MI and + Peripheral Vascular Disease   Rhythm:Regular     Neuro/Psych CVA    GI/Hepatic Neg liver ROS, GERD  Medicated and Controlled,  Endo/Other  diabetes, Type 2, Insulin Dependent  Renal/GU      Musculoskeletal   Abdominal   Peds  Hematology  (+) anemia ,   Anesthesia Other Findings 12/19 TTE:  Impressions:  - Limited study with agitated saline, negative bubble study. No ASD   or PFO.  ------------------------------------------------------------------- Study data:  Comparison was made to the study of 08/25/2018.  Study status:  Routine.  Procedure:  The patient reported no pain pre or post test. Transthoracic echocardiography. Image quality was adequate. Intravenous contrast (agitated saline) was administered. Study completion:  There were no complications. Transthoracic echocardiography.  M-mode, limited 2D, limited spectral Doppler, and color Doppler.  Birthdate:  Patient birthdate: 09-Apr-1951.  Age:  Patient is 68 yr old.  Sex:  Gender: male.    BMI: 28.3 kg/m^2.  Blood pressure:     142/63  Patient status:  Inpatient.  Study date:  Study date: 09/01/2018. Study time: 11:28 AM.  Location:   ICU/CCU  -------------------------------------------------------------------  ------------------------------------------------------------------- Left ventricle:  The cavity size was normal. Systolic function was normal. The estimated ejection fraction was in the range of 50% to 55%. Wall motion was normal; there were no regional wall motion abnormalities.  ------------------------------------------------------------------- Aortic valve:   Trileaflet; severely thickened, severely calcified leaflets.  ------------------------------------------------------------------- Aorta:  Aortic root: The aortic root was normal in size.  ------------------------------------------------------------------- Mitral valve:   Structurally normal valve.   Mobility was not restricted.  ------------------------------------------------------------------- Left atrium:  The atrium was normal in size.  ------------------------------------------------------------------- Right ventricle:  The cavity size was moderately dilated. Wall thickness was normal. Systolic function was moderately reduced.  ------------------------------------------------------------------- Ventricular septum:   Septal motion showed abnormal function and dyssynergy.  ------------------------------------------------------------------- Tricuspid valve:   Structurally normal valve.  ------------------------------------------------------------------- Right atrium:  The atrium was mildly dilated.  ------------------------------------------------------------------- Pericardium:  There was no pericardial effusion.  ------------------------------------------------------------------- Systemic veins: Inferior vena cava: The vessel was normal in size.  ------------------------------------------------------------------- Prepared and Electronically Authenticated by  Ena Dawley, M.D. 2019-12-17T12:23:38 [employer] Images    Show images for ECHOCARDIOGRAM LIMITED BUBBLE STUDY MERGE Images   Show images for ECHOCARDIOGRAM LIMITED BUBBLE STUDY Performing Technologist/Nurse   Performing Technologist/Nurse: Matilde Bash Reason for Exam  Priority: Routine  Comments: Please perform a bubble study to r/o shunting Patient Data   Height  72 in  BP  142/63 mmHg               Surgical History   Surgical History    Procedure Laterality Date Comment Source CARDIAC CATHETERIZATION   with stent Provider  Other Surgical History    Procedure Laterality Date Comment Source cardiac stents  2009  Provider CATARACT EXTRACTION [facility] Left 08/19/2013  Procedure: CATARACT EXTRACTION PHACO AND INTRAOCULAR LENS PLACEMENT (IOC); Surgeon: Tonny Branch, MD; Location: [facility]; Service: Ophthalmology; Laterality: Left; CDE:37.77 Provider CATARACT EXTRACTION W/PHACO Right 07/29/2016 Procedure: CATARACT EXTRACTION PHACO AND INTRAOCULAR LENS PLACEMENT RIGHT EYE; CDE: 18.10; Surgeon: Tonny Branch, MD; Location: [facility]; Service: Ophthalmology; Laterality: Right; Provider IR CT HEAD LTD  08/28/2018  Provider IR PERCUTANEOUS ART THROMBECTOMY/INFUSION INTRACRANIAL INC DIAG ANGIO  08/28/2018  Provider LOWER EXTREMITY ANGIOGRAPHY Bilateral 12/07/2018 Procedure: LOWER EXTREMITY ANGIOGRAPHY; Surgeon: Waynetta Sandy, MD; Location: [place] [facility]; Service: Cardiovascular; Laterality: Bilateral; Provider PERIPHERAL VASCULAR INTERVENTION Left 12/07/2018 Procedure: PERIPHERAL VASCULAR INTERVENTION; Surgeon: Waynetta Sandy, MD; Location: [place] [facility]; Service: Cardiovascular; Laterality: Left; Provider RADIOLOGY WITH ANESTHESIA N/A 08/28/2018 Procedure: IR WITH ANESTHESIA; Surgeon: Radiologist, Medication, MD; Location: [place]; Service: Radiology; Laterality: N/A; Provider  Implants    No active implants to display in this view. Order-Level Documents - 08/24/2018:   Scan  on 08/25/2018 10:39 AM by Default, Provider, MD    Encounter-Level Documents - 08/24/2018:   Scan on 09/06/2018 10:48 AM by Default, Provider, MD  Scan on 08/31/2018 4:02 PM by Default, Provider, MD  Document on 08/29/2018 12:19 AM by Arnette Felts, RN: ED PB Billing Extract  Scan on 08/28/2018 9:22 AM by Default, Provider, MD  Scan on 09/05/2018 9:41 AM by Default, Provider, MD  Scan on 09/05/2018 7:53 AM by Default, Provider, MD  Scan on 08/25/2018 12:09 PM by Default, Provider, MD  Electronic signature on 08/24/2018 11:00 AM - E-signed  Electronic signature on 08/24/2018 10:59 AM - E-signed  Scan on 08/28/2018 4:48 AM by Rosann Auerbach, RT    Resulted by:   Signed Date/Time  Phone Pager Dorothy Spark 09/01/2018 12:23 PM [phone]  External Result Report    External Result Report      Reproductive/Obstetrics                            Anesthesia Physical Anesthesia Plan  ASA: III  Anesthesia Plan: General   Post-op Pain Management:    Induction: Intravenous  PONV Risk Score and Plan: 2 and Ondansetron and Dexamethasone  Airway Management Planned: Oral ETT  Additional Equipment: None  Intra-op Plan:   Post-operative Plan: Extubation in OR  Informed Consent: I have reviewed the patients History and Physical, chart, labs and discussed the procedure including the risks, benefits and alternatives for the proposed anesthesia with the patient or authorized representative who has indicated his/her understanding and acceptance.     Dental advisory given  Plan Discussed with: CRNA and Surgeon  Anesthesia Plan Comments:         Anesthesia Quick Evaluation

## 2018-12-08 NOTE — Progress Notes (Signed)
Pt transferred to OR via bed with OR staff and me on tele. CCMD notified. Bedside report given to [employer].

## 2018-12-08 NOTE — Anesthesia Postprocedure Evaluation (Signed)
Anesthesia Post Note  Patient: [name]  Procedure(s) Performed: AMPUTATION ABOVE KNEE (Right Leg Upper)     Patient location during evaluation: PACU Anesthesia Type: General Level of consciousness: awake and patient cooperative Pain management: pain level controlled Vital Signs Assessment: post-procedure vital signs reviewed and stable Respiratory status: spontaneous breathing, nonlabored ventilation, respiratory function stable and patient connected to nasal cannula oxygen Cardiovascular status: blood pressure returned to baseline and stable Postop Assessment: no apparent nausea or vomiting Anesthetic complications: no    Last Vitals:  Vitals:   12/08/18 0918 12/08/18 1006  BP: (!) 104/59 120/73  Pulse: 77 79  Resp: 15 15  Temp:    SpO2: 95% 97%    Last Pain:  Vitals:   12/08/18 1006  TempSrc:   PainSc: Asleep                 Clayton [name]

## 2018-12-08 NOTE — Transfer of Care (Signed)
Immediate Anesthesia Transfer of Care Note  Patient: Clayton [name]  Procedure(s) Performed: AMPUTATION ABOVE KNEE (Right Leg Upper)  Patient Location: PACU  Anesthesia Type:General  Level of Consciousness: patient cooperative and responds to stimulation  Airway & Oxygen Therapy: Patient Spontanous Breathing and Patient connected to nasal cannula oxygen  Post-op Assessment: Report given to RN and Post -op Vital signs reviewed and stable  Post vital signs: Reviewed and stable  Last Vitals:  Vitals Value Taken Time  BP 118/66 12/08/2018  8:47 AM  Temp    Pulse 78 12/08/2018  8:51 AM  Resp 16 12/08/2018  8:51 AM  SpO2 100 % 12/08/2018  8:51 AM  Vitals shown include unvalidated device data.  Last Pain:  Vitals:   12/08/18 0546  TempSrc: Oral  PainSc:          Complications: No apparent anesthesia complications

## 2018-12-08 NOTE — Progress Notes (Addendum)
  Progress Note    12/08/2018 7:23 AM Day of Surgery  Subjective:  No overnight issues  Vitals:   12/08/18 0128 12/08/18 0546  BP:  115/62  Pulse: (!) 106 88  Resp: (!) 30 16  Temp: 98.5 F (36.9 C) 99.2 F (37.3 C)  SpO2: 94% 93%    Physical Exam: [id number] Non labored respirations Foul smelling right foot, mostly dressed. 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     CBC    Component Value Date/Time   WBC 9.2 12/08/2018 0253   RBC 2.33 (L) 12/08/2018 0253   HGB 7.1 (L) 12/08/2018 0253   HCT 23.2 (L) 12/08/2018 0253   PLT 288 12/08/2018 0253   MCV 99.6 12/08/2018 0253   MCH 30.5 12/08/2018 0253   MCHC 30.6 12/08/2018 0253   RDW 13.1 12/08/2018 0253   LYMPHSABS 0.4 (L) 08/28/2018 1002   MONOABS 0.4 08/28/2018 1002   EOSABS 0.0 08/28/2018 1002   BASOSABS 0.0 08/28/2018 1002    BMET    Component Value Date/Time   NA 136 12/08/2018 0253   K 3.6 12/08/2018 0253   CL 100 12/08/2018 0253   CO2 28 12/08/2018 0253   GLUCOSE 249 (H) 12/08/2018 0253   BUN 7 (L) 12/08/2018 0253   CREATININE 0.66 12/08/2018 0253   CALCIUM 7.6 (L) 12/08/2018 0253   GFRNONAA >60 12/08/2018 0253   GFRAA >60 12/08/2018 0253    INR    Component Value Date/Time   INR 1.18 09/06/2018 0650     Intake/Output Summary (Last 24 hours) at 12/08/2018 0723 Last data filed at 12/07/2018 2325 Gross per 24 hour  Intake 1040 ml  Output 20 ml  Net 1020 ml     Assessment:  68 y.o. male is here with gangrenous right foot to the level of the ankle and beginning of gangrenous changes to left foot and will likely need bypass.  Plan: OR today right aka Will likely need left fem-pop bypass   Auston Halfmann C. Donzetta Matters, MD [facility] Office: [phone] Pager: [phone]  12/08/2018 7:23 AM

## 2018-12-09 ENCOUNTER — Encounter (HOSPITAL_COMMUNITY): Payer: Self-pay | Admitting: Vascular Surgery

## 2018-12-09 DIAGNOSIS — I472 Ventricular tachycardia: Secondary | ICD-10-CM

## 2018-12-09 LAB — BASIC METABOLIC PANEL
Anion gap: 6 (ref 5–15)
BUN: 6 mg/dL — ABNORMAL LOW (ref 8–23)
CO2: 28 mmol/L (ref 22–32)
CREATININE: 0.54 mg/dL — AB (ref 0.61–1.24)
Calcium: 7.6 mg/dL — ABNORMAL LOW (ref 8.9–10.3)
Chloride: 101 mmol/L (ref 98–111)
GFR calc Af Amer: >60 mL/min (ref >60)
GFR calc non Af Amer: >60 mL/min (ref >60)
Glucose, Bld: 180 mg/dL — ABNORMAL HIGH (ref 70–99)
Potassium: 3.7 mmol/L (ref 3.5–5.1)
Sodium: 135 mmol/L (ref 135–145)

## 2018-12-09 LAB — GLUCOSE, CAPILLARY
Glucose-Capillary: 156 mg/dL — ABNORMAL HIGH (ref 70–99)
Glucose-Capillary: 190 mg/dL — ABNORMAL HIGH (ref 70–99)
Glucose-Capillary: 399 mg/dL — ABNORMAL HIGH (ref 70–99)
Glucose-Capillary: 132 mg/dL — ABNORMAL HIGH (ref 70–99)

## 2018-12-09 LAB — CBC
HCT: 24.1 % — ABNORMAL LOW (ref 39.0–52.0)
Hemoglobin: 7.6 g/dL — ABNORMAL LOW (ref 13.0–17.0)
MCH: 31.4 pg (ref 26.0–34.0)
MCHC: 31.5 g/dL (ref 30.0–36.0)
MCV: 99.6 fL (ref 80.0–100.0)
Platelets: 294 10*3/uL (ref 150–400)
RBC: 2.42 MIL/uL — ABNORMAL LOW (ref 4.22–5.81)
RDW: 12.8 % (ref 11.5–15.5)
WBC: 13.1 10*3/uL — ABNORMAL HIGH (ref 4.0–10.5)
nRBC: 0 % (ref 0.0–0.2)

## 2018-12-09 LAB — MAGNESIUM: Magnesium: 1.1 mg/dL — ABNORMAL LOW (ref 1.7–2.4)

## 2018-12-09 MED ORDER — POTASSIUM CHLORIDE CRYS ER 20 MEQ PO TBCR
20.0000 meq | EXTENDED_RELEASE_TABLET | Freq: Once | ORAL | Status: AC
  Administered 2018-12-09: 20 meq via ORAL
  Filled 2018-12-09: qty 1

## 2018-12-09 MED ORDER — MAGNESIUM SULFATE 50 % IJ SOLN
4.0000 g | Freq: Once | INTRAMUSCULAR | Status: DC
  Filled 2018-12-09: qty 8

## 2018-12-09 MED ORDER — MAGNESIUM SULFATE 4 GM/100ML IV SOLN
4.0000 g | Freq: Once | INTRAVENOUS | Status: AC
  Administered 2018-12-09: 4 g via INTRAVENOUS
  Filled 2018-12-09: qty 100

## 2018-12-09 NOTE — Consult Note (Addendum)
Cardiology Consult    Patient ID: Clayton [name] MRN: [id number], DOB/AGE: [birth year]   Admit date: 12/07/2018 Date of Consult: 12/09/2018  Primary Physician: Dione Housekeeper, MD Primary Cardiologist: Carlyle Dolly, MD Requesting Provider: Waynetta Sandy, MD  Patient Profile    Clayton [name] is a 68 y.o. male with a history of CAD s/p BMS x3 to RCA in 2009, hypertension, hyperlipidemia, type 2 diabetes mellitus, embolic stroke in 40/9811, COPD, pulmonary arterial hypertension, peripheral vascular disease, who is being seen today for the evaluation of wide complex tachycardia at the request of Dr. Donzetta Matters.  History of Present Illness    [name] is a 68 year old male with the above history who is followed by Dr. Harl Bowie. He was last seen by Dr. Harl Bowie in 01/2016 for follow-up at which time he was doing well from a cardiac standpoint.   Patient was admitted on 12/07/2018 for planned vascular surgery. He underwent a lower extremity angiography which showed a very long segment SFA occlusion with 3-vessel runoff of the right lower extremity and a long segment SFA occlusion on the left as well. Lesion was unable to be crossed. 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 Today, patient noted to have wide complex tachycardia on monitor but has since converted to sinus rhythm. Therefore, Cardiology was consulted. Per review of telemetry, patient had about a 30 second run of a ventricular rhythm with rates in the low 100's. Patient was completely asymptomatic during this episode. However, systolic BP did drop to the 80's during this. RN had the patient bear down and patient converted back to sinus rhythm. Patient currently in sinus rhythm and most recent BP 114/60.   Patient is a poor historian and is not oriented to year or place so it was difficult to obtain a detailed history and  ROS. Spoke with RN and this reportedly his baseline. Patient denies any cardiac symptoms including chest pain, shortness of breath, palpitations, lightheadedness/dizziness, orthopnea, or PND. He states his left leg looks bigger than usual but no edema on exam. At the time of this evaluation, patient is resting comfortably eating lunch. He states his only concern at this time is if he did the right thing with the amputation.  Past Medical History   Past Medical History:  Diagnosis Date   Acute on chronic respiratory failure with hypoxia (HCC)    Altered mental status    CAD (coronary artery disease)    a. s/p BMS x 3 to RCA in 2009   COPD (chronic obstructive pulmonary disease) (HCC)    COPD, severe (HCC)    Diabetes mellitus    Embolic stroke involving right middle cerebral artery (HCC)    GERD (gastroesophageal reflux disease)    Guaiac positive stools 07/03/2018   HTN (hypertension)    Hyperlipidemia    Myocardial infarction [facility]) 2009   Non-STEMI (non-ST elevated myocardial infarction) ([place])    Prostate cancer ([place])    Pulmonary arterial hypertension ([place])    PVD (peripheral vascular disease) ([place])     Past Surgical History:  Procedure Laterality Date   AMPUTATION Right 12/08/2018   Procedure: AMPUTATION ABOVE KNEE;  Surgeon: Waynetta Sandy, MD;  Location: [place];  Service: Vascular;  Laterality: Right;   CARDIAC CATHETERIZATION     with stent   cardiac stents  2009   CATARACT EXTRACTION W/PHACO Left 08/19/2013   Procedure: CATARACT EXTRACTION PHACO  AND INTRAOCULAR LENS PLACEMENT (IOC);  Surgeon: Tonny Branch, MD;  Location: [facility];  Service: Ophthalmology;  Laterality: Left;  CDE:37.77   CATARACT EXTRACTION W/PHACO Right 07/29/2016   Procedure: CATARACT EXTRACTION PHACO AND INTRAOCULAR LENS PLACEMENT RIGHT EYE; CDE:  18.10;  Surgeon: Tonny Branch, MD;  Location: [facility];  Service: Ophthalmology;  Laterality: Right;   IR CT HEAD LTD  08/28/2018   IR  PERCUTANEOUS ART THROMBECTOMY/INFUSION INTRACRANIAL INC DIAG ANGIO  08/28/2018   LOWER EXTREMITY ANGIOGRAPHY Bilateral 12/07/2018   Procedure: LOWER EXTREMITY ANGIOGRAPHY;  Surgeon: Waynetta Sandy, MD;  Location: [place] [facility];  Service: Cardiovascular;  Laterality: Bilateral;   PERIPHERAL VASCULAR INTERVENTION Left 12/07/2018   Procedure: PERIPHERAL VASCULAR INTERVENTION;  Surgeon: Waynetta Sandy, MD;  Location: [place] [facility];  Service: Cardiovascular;  Laterality: Left;   RADIOLOGY WITH ANESTHESIA N/A 08/28/2018   Procedure: IR WITH ANESTHESIA;  Surgeon: Radiologist, Medication, MD;  Location: [place];  Service: Radiology;  Laterality: N/A;     Allergies  No Known Allergies  Inpatient Medications     aspirin  81 mg Oral Daily   atorvastatin  40 mg Oral QPM   carvedilol  6.25 mg Oral BID WC   Chlorhexidine Gluconate Cloth  6 each Topical Q0600   docusate  100 mg Oral Daily   heparin  5,000 Units Subcutaneous Q8H   insulin aspart  0-15 Units Subcutaneous TID WC   insulin aspart  0-5 Units Subcutaneous QHS   insulin aspart  4 Units Subcutaneous TID WC   mupirocin ointment  1 application Nasal BID   pantoprazole  40 mg Oral Daily   predniSONE  10 mg Oral Q breakfast   sodium chloride flush  3 mL Intravenous Q12H    Family History    Family History  Problem Relation Age of Onset   Coronary artery disease Mother    Coronary artery disease Father    Prostate cancer Father    He indicated that the status of his mother is unknown. He indicated that the status of his father is unknown. He indicated that his other is alive.   Social History    Social History   Socioeconomic History   Marital status: Single    Spouse name: Not on file   Number of children: Not on file   Years of education: Not on file   Highest education level: Not on file  Occupational History   Occupation: Associate Professor  Social Needs   Financial  resource strain: Not on file   Food insecurity:    Worry: Not on file    Inability: Not on file   Transportation needs:    Medical: Not on file    Non-medical: Not on file  Tobacco Use   Smoking status: Former Smoker    Packs/day: 1.00    Years: 42.00    Pack years: 42.00    Types: Cigarettes    Start date: 07/07/1969   Smokeless tobacco: Never Used  Substance and Sexual Activity   Alcohol use: Not Currently    Alcohol/week: 0.0 standard drinks    Comment: rarely   Drug use: No   Sexual activity: Yes  Lifestyle   Physical activity:    Days per week: Not on file    Minutes per session: Not on file   Stress: Not on file  Relationships   Social connections:    Talks on phone: Not on file    Gets together: Not on file  Attends religious service: Not on file    Active member of club or organization: Not on file    Attends meetings of clubs or organizations: Not on file    Relationship status: Not on file   Intimate partner violence:    Fear of current or ex partner: Not on file    Emotionally abused: Not on file    Physically abused: Not on file    Forced sexual activity: Not on file  Other Topics Concern   Not on file  Social History Narrative   Not on file     Review of Systems    Review of Systems  Constitutional: Negative for chills and fever.  HENT: Negative for congestion.   Respiratory: Negative for cough and shortness of breath.   Cardiovascular: Negative for chest pain, palpitations, orthopnea, leg swelling and PND.  Gastrointestinal: Negative for blood in stool, nausea and vomiting.  Genitourinary: Negative for hematuria.  Musculoskeletal: Negative for myalgias.  Neurological: Negative for dizziness and loss of consciousness.  Endo/Heme/Allergies: Does not bruise/bleed easily.  Psychiatric/Behavioral: Positive for memory loss.    Physical Exam    Blood pressure (!) 84/58, pulse 80, temperature 98.9 F (37.2 C), temperature source  Oral, resp. rate (!) 25, height 6' (1.829 m), weight 90.7 kg, SpO2 100 %.  General: 68 y.o. male resting comfortably in no acute distress. Pleasant and cooperative. HEENT: Normal  Neck: Supple.  Lungs: No increased work of breathing. Clear to auscultation anteriorly (patient would not sit forward to allow be to listen posteriorly). Heart: RRR. Distinct S1 and S2. No significant murmurs, gallops, or rubs.  Abdomen: Soft, non-distended, and non-tender to palpation. Bowel sounds present. Extremities: No clubbing, cyanosis or edema. S/p right above the knee amputation. Radial pulses 2+ and equal bilaterally. Skin: Warm and dry. Neuro: Alert and oriented to name, birthday, and president but not oriented to year or place. No focal deficits. Moves all extremities spontaneously. Psych: Some baseline cognitive impairment.  Labs    Troponin (Point of Care Test) No results for input(s): TROPIPOC in the last 72 hours. No results for input(s): CKTOTAL, CKMB, TROPONINI in the last 72 hours. Lab Results  Component Value Date   WBC 13.1 (H) 12/09/2018   HGB 7.6 (L) 12/09/2018   HCT 24.1 (L) 12/09/2018   MCV 99.6 12/09/2018   PLT 294 12/09/2018    Recent Labs  Lab 12/09/18 0241  NA 135  K 3.7  CL 101  CO2 28  BUN 6*  CREATININE 0.54*  CALCIUM 7.6*  GLUCOSE 180*   Lab Results  Component Value Date   CHOL 112 08/28/2018   HDL 32 (L) 08/28/2018   LDLCALC 65 08/28/2018   TRIG 31 09/03/2018   Lab Results  Component Value Date   DDIMER 0.70 (H) 06/27/2015     Radiology Studies    Vas [place] Abi With/wo Tbi  Result Date: 12/04/2018 LOWER EXTREMITY DOPPLER STUDY Indications: Ulceration, peripheral artery disease, and Ulcer right foot,              gangrene toes. High Risk Factors: Hypertension, hyperlipidemia, past history of smoking.  Vascular Interventions: None. Comparison Study: No prior exam Performing Technologist: Alvia Grove RVT  Examination Guidelines: A complete evaluation includes  at minimum, Doppler waveform signals and systolic blood pressure reading at the level of bilateral brachial, anterior tibial, and posterior tibial arteries, when vessel segments are accessible. Bilateral testing is considered an integral part of a complete examination. Photoelectric Plethysmograph (PPG) waveforms and  toe systolic pressure readings are included as required and additional duplex testing as needed. Limited examinations for reoccurring indications may be performed as noted.  ABI Findings: +---------+------------------+-----+----------+--------+  Right     Rt Pressure (mmHg) Index Waveform   Comment   +---------+------------------+-----+----------+--------+  Brachial  143                                           +---------+------------------+-----+----------+--------+  PTA       32                 0.21  monophasic           +---------+------------------+-----+----------+--------+  DP        83                 0.54  monophasic           +---------+------------------+-----+----------+--------+  Great Toe                                     ulcer     +---------+------------------+-----+----------+--------+ +---------+------------------+-----+----------+-------+  Left      Lt Pressure (mmHg) Index Waveform   Comment  +---------+------------------+-----+----------+-------+  Brachial  154                                          +---------+------------------+-----+----------+-------+  PTA       97                 0.63  monophasic          +---------+------------------+-----+----------+-------+  DP        98                 0.64  monophasic          +---------+------------------+-----+----------+-------+  Great Toe                                     ulcer    +---------+------------------+-----+----------+-------+ +-------+-----------+-----------+------------+------------+  ABI/TBI Today's ABI Today's TBI Previous ABI Previous TBI  +-------+-----------+-----------+------------+------------+  Right   0.54         NA                                     +-------+-----------+-----------+------------+------------+  Left    0.64        NA                                     +-------+-----------+-----------+------------+------------+ Hyperemic flow bilaterally.  Summary: Right: Resting right ankle-brachial index indicates moderate right lower extremity arterial disease. Left: Resting left ankle-brachial index indicates moderate left lower extremity arterial disease.  *See table(s) above for measurements and observations.  Electronically signed by Servando Snare MD on 12/04/2018 at 10:35:26 AM.    Final    Vas [place] Lower Extremity Saphenous Vein Mapping  Result Date: 12/07/2018 LOWER EXTREMITY VEIN MAPPING Indications:  Pre-op Risk Factors: PAD.  Comparison Study: No prior study on  file Performing Technologist: Sharion Dove RVS  Examination Guidelines: A complete evaluation includes B-mode imaging, spectral Doppler, color Doppler, and power Doppler as needed of all accessible portions of each vessel. Bilateral testing is considered an integral part of a complete examination. Limited examinations for reoccurring indications may be performed as noted. +---------------+-----------+----------------------+---------------+-----------+    RT Diameter   RT Findings          GSV             LT Diameter   LT Findings        (cm)                                               (cm)                    +---------------+-----------+----------------------+---------------+-----------+       0.37                       Saphenofemoral          0.33                                                        Junction                                     +---------------+-----------+----------------------+---------------+-----------+       0.25                       Proximal thigh          0.43                    +---------------+-----------+----------------------+---------------+-----------+       0.23        branching        Mid thigh             0.40                     +---------------+-----------+----------------------+---------------+-----------+       0.32        branching       Distal thigh           0.43                    +---------------+-----------+----------------------+---------------+-----------+       0.40                            Knee               0.38                    +---------------+-----------+----------------------+---------------+-----------+       0.32        branching        Prox calf             0.43                    +---------------+-----------+----------------------+---------------+-----------+  0.39                          Mid calf             0.36        branching   +---------------+-----------+----------------------+---------------+-----------+       0.33                        Distal calf            0.22                    +---------------+-----------+----------------------+---------------+-----------+       0.28        branching          Ankle               0.28                    +---------------+-----------+----------------------+---------------+-----------+ Diagnosing physician: Ruta Hinds MD Electronically signed by Ruta Hinds MD on 12/07/2018 at 5:52:02 PM.    Final     EKG     EKG: EKG was personally reviewed and demonstrates: Normal sinus rhythm, rate 86 bpm, with known RBBB and Q waves in inferior leads but no significant changes compared to prior tracings.   Telemetry: Telemetry was personally reviewed and demonstrates: Sinus rhythm with rates in the 80's to low 100's with multiple PVCs. About a 30 second run of ventricular tachycardia with rate around 100 bpm noted today around 11:35am.   Cardiac Imaging    Echocardiogram 09/01/2018: Study Conclusions: - Left ventricle: The cavity size was normal. Systolic function was   normal. The estimated ejection fraction was in the range of 50%   to 55%. Wall motion was normal; there were no regional wall   motion abnormalities. - Ventricular septum:  Septal motion showed abnormal function and   dyssynergy. - Right ventricle: The cavity size was moderately dilated. Wall   thickness was normal. Systolic function was moderately reduced. - Right atrium: The atrium was mildly dilated. - Inferior vena cava: The vessel was normal in size.  Impressions: - Limited study with agitated saline, negative bubble study. No ASD   or PFO.  Assessment & Plan    Ventricular Rhythm - Patient presented for planned vascular surgery for PAD and s/p right above knee amputation yesterday. Cardiology consulted today for brief run of ventricular tachycardia with rate about 626 bpm. Systolic BP in the 94'W with this. RN had patient bear down and patient converted back to sinus rhythm after about 30 seconds. Patient completely asymptomatic during entire episode. Patient is poor history and has some degree of cognitive impairment so difficult to obtain detailed history but patient denies any recent cardiac symptoms. - Currently in normal sinus rhythm on telemetry. - EKG shows normal sinus rhythm with known RBBB and Q waves in inferior leads but no significant changes compared to prior tracings. - Patient is hemodynamically stable. Most recent BP 114/60.  - LVEF 50-55% on last Echo in 08/2018.  - Potassium 3.7 today. Goal > 4.0. Replete as needed.  - Magnesium pending. Goal > 2.0. Replete as needed.  - Continue home Coreg 6.25mg  twice daily. Can increase dose if BP allows. - Will continue to monitor on telemetry. If ventricular rhythm persists, could consider repeat Echo.  CAD - Patient has a history of CAD with inferior wall MI in 2009  s/p PCI with 3 overlapping BMS. - Patient denies any chest pain or other cardiac symptoms. - Continue aspirin, beta-blocker (if BP allows), and high-intensity statin.  Hypertension - Most recent BP 114/60. - Continue Coreg as above.  Otherwise, per primary team.  Signed, Darreld Mclean, PA-C 12/09/2018, 12:35 PM  For  questions or updates, please contact   Please consult [email or web address] for contact info under Cardiology/STEMI.  As above, patient seen and examined.  Briefly he is a 68 year old male with past medical history of diabetes mellitus, hypertension, hyperlipidemia, coronary artery disease with prior PCI of RCA, COPD, peripheral vascular disease, prior CVA and status post right AKA this admission for evaluation of wide-complex tachycardia.  Last echocardiogram December 2019 showed ejection fraction 50 to 55%, moderate right ventricular enlargement, moderate right ventricular dysfunction and mild right atrial enlargement.  Underwent right AKA this admission.  On telemetry noted to have short run of wide-complex tachycardia.  Cardiology asked to evaluate.  Patient denies dyspnea, chest pain, palpitations or history of syncope.  Electrocardiogram shows sinus rhythm with PVC, right bundle branch block, inferior infarct.  1 wide-complex tachycardia-short run of nonsustained ventricular tachycardia.  Asymptomatic with no chest pain, dyspnea, palpitations or syncope.  Ejection fraction is low normal.  Continue beta-blocker.  Mg 1.1 and K 3.7. Supplement  2 coronary artery disease-continue aspirin and statin.  3 hypertension-patient's blood pressure is controlled.  Continue present medications and follow.  4 hyperlipidemia-continue statin.  5 status post right AKA-Per surgery.  Kirk Ruths, MD

## 2018-12-09 NOTE — Evaluation (Signed)
Occupational Therapy Evaluation Patient Details Name: Clayton [name] MRN: [id number] DOB: 1950/09/18 Today's Date: 12/09/2018    History of Present Illness 68 y.o. 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 Pt with other significant PMH of PVD, prostate CA, HTN, DM, COPD, CAD, cardiac stent placement, and bil cataract surgery.   Clinical Impression   PTA Pt at SNF recovering from MCA stroke. Pt unreliable historian but suspect that he required assist for transfers (functioning at [facility] level) and ADL. Pt is currently total A +2 for lateral scoot transfer to the drop arm recliner. He was unable to access LLE for donning sock, he required assist for seated balance required for grooming tasks. Pt will require skilled OT in the acute setting and afterwards at the SNF level to maximize safety and independence in ADL and functional transfers.    Follow Up Recommendations  SNF;Supervision/Assistance - 24 hour    Equipment Recommendations  Other (comment)(defer to next venue)    Recommendations for Other Services       Precautions / Restrictions Precautions Precautions: Fall Restrictions Weight Bearing Restrictions: Yes RLE Weight Bearing: Non weight bearing      Mobility Bed Mobility Overal bed mobility: Needs Assistance Bed Mobility: Supine to Sit     Supine to sit: Mod assist;+2 for physical assistance;+2 for safety/equipment;HOB elevated     General bed mobility comments: assist to move LLE to EOB, vc for hand placement and use of pad to bring hips EOB, utilized therapist for assist to elevate trunk  Transfers Overall transfer level: Needs assistance Equipment used: 2 person hand held assist Transfers: Lateral/Scoot Transfers          Lateral/Scoot Transfers: Total assist;+2 physical assistance;+2 safety/equipment General transfer comment: to the left side, reliant on therapists and bed pad to perform transfer- Pt is not coordinated enough to string together  movements    Balance Overall balance assessment: Needs assistance Sitting-balance support: Bilateral upper extremity supported;Feet supported Sitting balance-Leahy Scale: Poor                                     ADL either performed or assessed with clinical judgement   ADL Overall ADL's : Needs assistance/impaired Eating/Feeding: Minimal assistance Eating/Feeding Details (indicate cue type and reason): had spilled powder creamer all over himself when OT entered the room Grooming: Minimal assistance;Sitting Grooming Details (indicate cue type and reason): requires support in seated position Upper Body Bathing: Maximal assistance   Lower Body Bathing: Maximal assistance   Upper Body Dressing : Moderate assistance   Lower Body Dressing: Maximal assistance;Sitting/lateral leans Lower Body Dressing Details (indicate cue type and reason): unable to access LLE for dressing, is able to assist minimally with lateral leans Toilet Transfer: Total assistance;+2 for physical assistance;+2 for safety/equipment;Requires drop arm(lateral scoot transfers) Toilet Transfer Details (indicate cue type and reason): simulated through drop arm recliner transfer Toileting- Clothing Manipulation and Hygiene: Total assistance         General ADL Comments: Pt pleasantly confused     Vision         Perception     Praxis      Pertinent Vitals/Pain Pain Assessment: Faces Faces Pain Scale: Hurts even more Pain Location: R residual limb Pain Descriptors / Indicators: Discomfort;Sore Pain Intervention(s): Limited activity within patient's tolerance;Monitored during session;Repositioned     Hand Dominance Right   Extremity/Trunk Assessment Upper Extremity Assessment Upper Extremity Assessment: Generalized  weakness   Lower Extremity Assessment Lower Extremity Assessment: RLE deficits/detail;LLE deficits/detail RLE Deficits / Details: s/p AKA RLE: Unable to fully assess due to  pain RLE Coordination: decreased gross motor LLE Deficits / Details: L heel and toes weeping with smell LLE: Unable to fully assess due to pain LLE Sensation: decreased light touch;decreased proprioception LLE Coordination: decreased fine motor;decreased gross motor       Communication Communication Communication: No difficulties   Cognition Arousal/Alertness: Awake/alert Behavior During Therapy: WFL for tasks assessed/performed Overall Cognitive Status: Impaired/Different from baseline Area of Impairment: Attention;Memory;Following commands;Safety/judgement;Awareness;Problem solving                   Current Attention Level: Focused Memory: Decreased short-term memory;Decreased recall of precautions Following Commands: Follows one step commands with increased time;Follows one step commands inconsistently Safety/Judgement: Decreased awareness of safety;Decreased awareness of deficits Awareness: Intellectual Problem Solving: Decreased initiation;Difficulty sequencing;Requires verbal cues;Requires tactile cues General Comments: Pt pleasant throughout session, required verbal and physical cues for all movement   General Comments       Exercises     Shoulder Instructions      Home Living Family/patient expects to be discharged to:: Skilled nursing facility                                        Prior Functioning/Environment Level of Independence: Needs assistance  Gait / Transfers Assistance Needed: per patient was using WC for mobility - mod I for transfers ADL's / Homemaking Assistance Needed: set up level for ADL per patient   Comments: Pt unreliable historian at this time        OT Problem List: Decreased strength;Decreased range of motion;Decreased activity tolerance;Impaired balance (sitting and/or standing);Decreased safety awareness;Decreased knowledge of use of DME or AE;Decreased cognition;Decreased knowledge of precautions;Pain      OT  Treatment/Interventions: Self-care/ADL training;Therapeutic exercise;DME and/or AE instruction;Therapeutic activities;Patient/family education;Balance training    OT Goals(Current goals can be found in the care plan section) Acute Rehab OT Goals Patient Stated Goal: get back to dating OT Goal Formulation: With patient Time For Goal Achievement: 12/23/18 Potential to Achieve Goals: Good  OT Frequency: Min 2X/week   Barriers to D/C:            Co-evaluation PT/OT/SLP Co-Evaluation/Treatment: Yes Reason for Co-Treatment: Complexity of the patient's impairments (multi-system involvement);Necessary to address cognition/behavior during functional activity;For patient/therapist safety;To address functional/ADL transfers PT goals addressed during session: Balance;Strengthening/ROM OT goals addressed during session: ADL's and self-care;Strengthening/ROM      AM-PAC OT "6 Clicks" Daily Activity     Outcome Measure Help from another person eating meals?: A Little Help from another person taking care of personal grooming?: A Little Help from another person toileting, which includes using toliet, bedpan, or urinal?: Total Help from another person bathing (including washing, rinsing, drying)?: A Lot Help from another person to put on and taking off regular upper body clothing?: A Lot Help from another person to put on and taking off regular lower body clothing?: Total 6 Click Score: 12   End of Session Equipment Utilized During Treatment: Gait belt Nurse Communication: Mobility status;Precautions;Weight bearing status  Activity Tolerance: Patient tolerated treatment well Patient left: in chair;with call bell/phone within reach  OT Visit Diagnosis: Unsteadiness on feet (R26.81);Other abnormalities of gait and mobility (R26.89);Muscle weakness (generalized) (M62.81);Other symptoms and signs involving cognitive function;Pain Pain - Right/Left: Right Pain - part  of body: Leg                 Time: 0925-0952 OT Time Calculation (min): 27 min Charges:  OT General Charges $OT Visit: 1 Visit OT Evaluation $OT Eval Moderate Complexity: [place] OTR/L Acute Rehabilitation Services Pager: [phone] Office: [phone]' [place] 12/09/2018, 11:59 AM

## 2018-12-09 NOTE — NC FL2 (Signed)
[place] LEVEL OF CARE SCREENING TOOL     IDENTIFICATION  Patient Name: Clayton [name] Birthdate: 11-01-1950 Sex: male Admission Date (Current Location): 12/07/2018  [facility] and [place] Number:  [employer] and Address:  [facility], [place] [address], [place], [place] [address]      Provider Number: [id number]  Attending Physician Name and Address:  [place], [place] Name and Phone Number:       Current Level of Care: Hospital Recommended Level of Care: [place] Prior Approval Number:    Date Approved/Denied:   PASRR Number: [id number]  Discharge Plan: SNF    Current Diagnoses: Patient Active Problem List   Diagnosis Date Noted  . PAD (peripheral artery disease) ([place]) 12/07/2018  . Acute on chronic respiratory failure with hypoxia ([place])   . COPD, severe ([place])   . Pulmonary arterial hypertension ([place])   . Non-STEMI (non-ST elevated myocardial infarction) ([place])   . Embolic stroke involving right middle cerebral artery ([place])   . Altered mental status   . Tracheostomy status ([place])   . Stroke (cerebrum) ([place]) 08/28/2018  . Middle cerebral artery embolism, right 08/28/2018  . Elevated LFTs 08/24/2018  . AKI (acute kidney injury) ([place]) 08/24/2018  . Acute gastroenteritis 08/24/2018  . HTN (hypertension) 08/24/2018  . Dehydration 08/24/2018  . Guaiac positive stools 07/03/2018  . Acute on chronic respiratory failure with hypoxia ([place]) 05/13/2017  . Elevated troponin 05/13/2017  . Respiratory failure ([place]) 06/28/2015  . COPD exacerbation ([place]) 06/28/2015  . DM type 2 (diabetes mellitus, type 2) ([place]) 06/28/2015  . Erectile dysfunction associated with type 2 diabetes mellitus ([place]) 07/12/2014  . CAD S/P percutaneous coronary angioplasty 04/11/2014  . Prostate cancer ([place]) 04/27/2012  . Abnormal ECG 01/06/2012  . TOBACCO ABUSE 11/23/2010  . GERD 12/12/2009  . DIVERTICULOSIS, COLON  12/12/2009  . COLONIC POLYPS, BENIGN, HX OF 12/12/2009  . ANEMIA 12/04/2009  . HYPERLIPIDEMIA 09/15/2008  . MYOCARDIAL INFARCTION, HX OF 09/15/2008  . PERIPHERAL VASCULAR DISEASE 09/15/2008  . UNSPECIFIED IRON DEFICIENCY ANEMIA 02/15/2008  . BLOOD IN STOOL, OCCULT 02/15/2008    Orientation RESPIRATION BLADDER Height & Weight     Self, Situation  Normal Incontinent, External catheter Weight: 199 lb 15.3 oz (90.7 kg) Height:  6' (182.9 cm)  BEHAVIORAL SYMPTOMS/MOOD NEUROLOGICAL BOWEL NUTRITION STATUS      Continent Diet(see discharge summary)  AMBULATORY STATUS COMMUNICATION OF NEEDS Skin    Extensive Assistance Verbally Surgical wounds, Other (Comment)(right AKA with compression wrap dressing; venous stasis ulcer on left toe and heel)                       Personal Care Assistance Level of Assistance   Bathing Assistance Dressing Assistance Feeding Assistance  Bathing Assistance: Maximum Assistance Dressing Assistance: Maximum Assistance Feeding Assistance: Indepdendent         Functional Limitations Info  Sight, Hearing, Speech Sight Info: Adequate Hearing Info: Adequate Speech Info: Adequate    SPECIAL CARE FACTORS FREQUENCY  PT (By licensed PT), OT (By licensed OT)     PT Frequency: 5x week OT Frequency: 5x week            Contractures Contractures Info: Not present    Additional Factors Info  Code Status, Allergies, Insulin Sliding Scale Code Status Info: Full Code Allergies Info: No Known Allergies    Insulin Sliding Scale Info: insulin aspart (novoLOG) injection 0-15 Units 3x daily with meals;  insulin aspart (novoLOG) injection 0-5 Units daily at bedtime; insulin aspart (novoLOG) injection 4 Units        Current Medications (12/09/2018):  This is the current hospital active medication list Current Facility-Administered Medications  Medication Dose Route Frequency Provider Last Rate Last Dose  . 0.9 %  sodium chloride infusion  250 mL Intravenous  PRN Dagoberto Ligas, PA-C   Stopped at 12/08/18 0831  . 0.9 %  sodium chloride infusion   Intravenous Continuous Dagoberto Ligas, PA-C 50 mL/hr at 12/08/18 1014    . acetaminophen (TYLENOL) tablet 650 mg  650 mg Oral Q6H PRN Dagoberto Ligas, PA-C   650 mg at 12/07/18 2352  . alum & mag hydroxide-simeth (MAALOX/MYLANTA) 200-200-20 MG/5ML suspension 15-30 mL  15-30 mL Oral Q2H PRN Dagoberto Ligas, PA-C      . aspirin chewable tablet 81 mg  81 mg Oral Daily Dagoberto Ligas, PA-C   81 mg at 12/09/18 5465  . atorvastatin (LIPITOR) tablet 40 mg  40 mg Oral QPM Dagoberto Ligas, PA-C   40 mg at 12/08/18 1600  . bisacodyl (DULCOLAX) suppository 10 mg  10 mg Rectal Daily PRN Dagoberto Ligas, PA-C      . carvedilol (COREG) tablet 6.25 mg  6.25 mg Oral BID WC Dagoberto Ligas, PA-C   6.25 mg at 12/09/18 6812  . Chlorhexidine Gluconate Cloth 2 % PADS 6 each  6 each Topical Q0600 Dagoberto Ligas, PA-C   6 each at 12/08/18 0555  . docusate (COLACE) 50 MG/5ML liquid 100 mg  100 mg Oral Daily Dagoberto Ligas, PA-C   100 mg at 12/09/18 7517  . guaiFENesin (MUCINEX) 12 hr tablet 600 mg  600 mg Oral Q6H PRN Dagoberto Ligas, PA-C      . heparin injection 5,000 Units  5,000 Units Subcutaneous Q8H Dagoberto Ligas, PA-C   5,000 Units at 12/09/18 1449  . hydrALAZINE (APRESOLINE) injection 5 mg  5 mg Intravenous Q20 Min PRN Dagoberto Ligas, PA-C      . insulin aspart (novoLOG) injection 0-15 Units  0-15 Units Subcutaneous TID WC Dagoberto Ligas, PA-C   3 Units at 12/09/18 1245  . insulin aspart (novoLOG) injection 0-5 Units  0-5 Units Subcutaneous QHS Dagoberto Ligas, PA-C      . insulin aspart (novoLOG) injection 4 Units  4 Units Subcutaneous TID WC Dagoberto Ligas, PA-C   4 Units at 12/09/18 1339  . labetalol (NORMODYNE,TRANDATE) injection 10 mg  10 mg Intravenous Q10 min PRN Dagoberto Ligas, PA-C      . magnesium sulfate IVPB 2 g 50 mL  2 g Intravenous Daily PRN Dagoberto Ligas, PA-C      . magnesium  sulfate IVPB 4 g 100 mL  4 g Intravenous Once Sande Rives E, PA-C      . metoprolol tartrate (LOPRESSOR) injection 2-5 mg  2-5 mg Intravenous Q2H PRN Dagoberto Ligas, PA-C      . morphine 2 MG/ML injection 2-4 mg  2-4 mg Intravenous Q2H PRN Dagoberto Ligas, PA-C      . mupirocin ointment (BACTROBAN) 2 % 1 application  1 application Nasal BID Dagoberto Ligas, PA-C   1 application at 00/17/49 0831  . ondansetron (ZOFRAN) injection 4 mg  4 mg Intravenous Q6H PRN Dagoberto Ligas, PA-C      . oxyCODONE (Oxy IR/ROXICODONE) immediate release tablet 5-10 mg  5-10 mg Oral Q4H PRN Dagoberto Ligas, PA-C   5 mg at 12/07/18 1459  . pantoprazole (PROTONIX) EC tablet 40 mg  40 mg Oral Daily [place],  Rodman Key, PA-C   40 mg at 12/09/18 6122  . phenol (CHLORASEPTIC) mouth spray 1 spray  1 spray Mouth/Throat PRN Dagoberto Ligas, PA-C      . polyethylene glycol (MIRALAX / GLYCOLAX) packet 17 g  17 g Oral Daily PRN Dagoberto Ligas, PA-C      . potassium chloride SA (K-DUR,KLOR-CON) CR tablet 20-40 mEq  20-40 mEq Oral Daily PRN Dagoberto Ligas, PA-C      . predniSONE (DELTASONE) tablet 10 mg  10 mg Oral Q breakfast Dagoberto Ligas, PA-C   10 mg at 12/09/18 4497  . sodium chloride flush (NS) 0.9 % injection 3 mL  3 mL Intravenous Q12H Dagoberto Ligas, PA-C   3 mL at 12/09/18 5300  . sodium chloride flush (NS) 0.9 % injection 3 mL  3 mL Intravenous PRN Dagoberto Ligas, PA-C         Discharge Medications: Please see discharge summary for a list of discharge medications.  Relevant Imaging Results:  Relevant Lab Results:   Additional Information SSN: [place]  [place], [place]

## 2018-12-09 NOTE — Progress Notes (Signed)
Pt w/ episode of wide complex QRS. Pt asymptomatic. Asked to pt to bear down and he converted back to NSR. BP 84/58 (68). Gerri Lins, PA-C paged. Orders received to get EKG. Placed on chart. Will continue to monitor.  Clyde Canterbury, RN

## 2018-12-09 NOTE — Progress Notes (Addendum)
[facility]  Subjective  - No new complaints appears comfortable.   Objective (!) 145/82 80 98.9 F (37.2 C) (Oral) 17 100%  Intake/Output Summary (Last 24 hours) at 12/09/2018 0855 Last data filed at 12/08/2018 1633 Gross per 24 hour  Intake 242.03 ml  Output 650 ml  Net -407.97 ml    Post op dressing in place with minimal bloody drainage Lungs non labored breathing Heart RRR  Assessment/Planning: POD # 1 right AKA  Plan to change dressing tomorrow Pending SNF placement WBC elevated 13.1 will encourage IS HGB stable 7.6  At 1143 he had was tacy then with wide complex on heart monitor now in sinus rhythm.  I have ordered an EKG and we will continue to monitor him.  He does have a history of MI in 2009.    Clayton [name] 12/09/2018 8:55 AM --  Laboratory Lab Results: Recent Labs    12/08/18 0253 12/09/18 0241  WBC 9.2 13.1*  HGB 7.1* 7.6*  HCT 23.2* 24.1*  PLT 288 294   BMET Recent Labs    12/08/18 0253 12/09/18 0241  NA 136 135  K 3.6 3.7  CL 100 101  CO2 28 28  GLUCOSE 249* 180*  BUN 7* 6*  CREATININE 0.66 0.54*  CALCIUM 7.6* 7.6*    COAG Lab Results  Component Value Date   INR 1.18 09/06/2018   INR 1.10 09/02/2018   INR 1.28 08/27/2018   No results found for: PTT  I have examined the patient, reviewed and agree with above.  Curt Jews, MD 12/09/2018 1:34 PM

## 2018-12-09 NOTE — Evaluation (Signed)
Physical Therapy Evaluation Patient Details Name: Clayton [name] MRN: [id number] DOB: 11-03-1950 Today's Date: 12/09/2018   History of Present Illness  68 y.o. 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 Pt with other significant PMH of PVD, prostate CA, HTN, DM, COPD, CAD, cardiac stent placement, and bil cataract surgery.    Clinical Impression  Patient s/p above listed procedure. Patient today requiring Mod to total A+2 for all functional mobility with consistent verbal cueing throughout. Lateral transfer to recliner performed with required use of bed pad to facilitate hips laterally. Patient with difficulty sequencing cues for transfer. Will currently recommend SNF at discharge with PT to continue to follow acutely.     Follow Up Recommendations SNF;Supervision/Assistance - 24 hour    Equipment Recommendations  Other (comment)(defer)    Recommendations for Other Services       Precautions / Restrictions Precautions Precautions: Fall Restrictions Weight Bearing Restrictions: Yes RLE Weight Bearing: Non weight bearing      Mobility  Bed Mobility Overal bed mobility: Needs Assistance Bed Mobility: Supine to Sit     Supine to sit: Mod assist;+2 for physical assistance;+2 for safety/equipment;HOB elevated     General bed mobility comments: assist to move LLE to EOB, vc for hand placement and use of pad to bring hips EOB, utilized therapist for assist to elevate trunk  Transfers Overall transfer level: Needs assistance Equipment used: 2 person hand held assist Transfers: Lateral/Scoot Transfers          Lateral/Scoot Transfers: Total assist;+2 physical assistance;+2 safety/equipment General transfer comment: to the left side, reliant on therapists and bed pad to perform transfer- Pt is not coordinated enough to string together movements  Ambulation/Gait                Stairs            Wheelchair Mobility    Modified Rankin (Stroke Patients  Only)       Balance Overall balance assessment: Needs assistance Sitting-balance support: Bilateral upper extremity supported;Feet supported Sitting balance-Leahy Scale: Poor                                       Pertinent Vitals/Pain Pain Assessment: Faces Faces Pain Scale: Hurts even more Pain Location: R residual limb Pain Descriptors / Indicators: Discomfort;Sore Pain Intervention(s): Limited activity within patient's tolerance;Monitored during session;Repositioned    Home Living Family/patient expects to be discharged to:: Skilled nursing facility                      Prior Function Level of Independence: Needs assistance   Gait / Transfers Assistance Needed: per patient was using WC for mobility - mod I for transfers  ADL's / Homemaking Assistance Needed: set up level for ADL per patient  Comments: Pt unreliable historian at this time     Hand Dominance   Dominant Hand: Right    Extremity/Trunk Assessment   Upper Extremity Assessment Upper Extremity Assessment: Defer to OT evaluation    Lower Extremity Assessment Lower Extremity Assessment: RLE deficits/detail;LLE deficits/detail RLE Deficits / Details: s/p AKA RLE: Unable to fully assess due to pain RLE Coordination: decreased gross motor LLE Deficits / Details: L heel and toes weeping with smell LLE: Unable to fully assess due to pain LLE Sensation: decreased light touch;decreased proprioception LLE Coordination: decreased fine motor;decreased gross motor       Communication  Communication: No difficulties  Cognition Arousal/Alertness: Awake/alert Behavior During Therapy: WFL for tasks assessed/performed Overall Cognitive Status: Impaired/Different from baseline Area of Impairment: Attention;Memory;Following commands;Safety/judgement;Awareness;Problem solving                   Current Attention Level: Focused Memory: Decreased short-term memory;Decreased recall of  precautions Following Commands: Follows one step commands with increased time;Follows one step commands inconsistently Safety/Judgement: Decreased awareness of safety;Decreased awareness of deficits Awareness: Intellectual Problem Solving: Decreased initiation;Difficulty sequencing;Requires verbal cues;Requires tactile cues General Comments: Pt pleasant throughout session, required verbal and physical cues for all movement      General Comments      Exercises     Assessment/Plan    PT Assessment Patient needs continued PT services  PT Problem List Decreased strength;Decreased activity tolerance;Decreased balance;Decreased mobility;Decreased knowledge of use of DME;Decreased safety awareness;Decreased knowledge of precautions       PT Treatment Interventions DME instruction;Functional mobility training;Therapeutic activities;Therapeutic exercise;Balance training;Neuromuscular re-education;Wheelchair mobility training;Patient/family education    PT Goals (Current goals can be found in the Care Plan section)  Acute Rehab PT Goals Patient Stated Goal: get back to dating PT Goal Formulation: With patient Time For Goal Achievement: 12/23/18 Potential to Achieve Goals: Fair    Frequency Min 2X/week   Barriers to discharge        Co-evaluation PT/OT/SLP Co-Evaluation/Treatment: Yes Reason for Co-Treatment: Complexity of the patient's impairments (multi-system involvement);Necessary to address cognition/behavior during functional activity;For patient/therapist safety;To address functional/ADL transfers PT goals addressed during session: Mobility/safety with mobility;Strengthening/ROM;Balance OT goals addressed during session: ADL's and self-care;Strengthening/ROM       AM-PAC PT "6 Clicks" Mobility  Outcome Measure Help needed turning from your back to your side while in a flat bed without using bedrails?: A Lot Help needed moving from lying on your back to sitting on the side  of a flat bed without using bedrails?: A Lot Help needed moving to and from a bed to a chair (including a wheelchair)?: Total Help needed standing up from a chair using your arms (e.g., wheelchair or bedside chair)?: Total Help needed to walk in hospital room?: Total Help needed climbing 3-5 steps with a railing? : Total 6 Click Score: 8    End of Session Equipment Utilized During Treatment: Gait belt Activity Tolerance: Patient tolerated treatment well Patient left: in chair;with call bell/phone within reach Nurse Communication: Mobility status PT Visit Diagnosis: Unsteadiness on feet (R26.81);Other abnormalities of gait and mobility (R26.89);Muscle weakness (generalized) (M62.81)    Time: 6160-7371 PT Time Calculation (min) (ACUTE ONLY): 27 min   Charges:   PT Evaluation $PT Eval Moderate Complexity: 1 Mod           Lanney Gins, PT, DPT Supplemental Physical Therapist 12/09/18 12:13 PM Pager: [phone] Office: [phone]

## 2018-12-09 NOTE — TOC Progression Note (Signed)
Transition of Care [facility]) - Progression Note    Patient Details  Name: Clayton [name] MRN: [id number] Date of Birth: 05-Dec-1950  Transition of Care [facility]) CM/SW [place], [place] Phone Number: 12/09/2018, 8:24 AM  Clinical Narrative:     CSW acknowledges consult for SNF placement. Will follow for therapy recommendations.         Expected Discharge Plan and Services           Expected Discharge Date: 12/14/18                         Social Determinants of Health (SDOH) Interventions    Readmission Risk Interventions No flowsheet data found.

## 2018-12-10 LAB — CBC
HCT: 24.9 % — ABNORMAL LOW (ref 39.0–52.0)
Hemoglobin: 7.6 g/dL — ABNORMAL LOW (ref 13.0–17.0)
MCH: 30.5 pg (ref 26.0–34.0)
MCHC: 30.5 g/dL (ref 30.0–36.0)
MCV: 100 fL (ref 80.0–100.0)
Platelets: 314 10*3/uL (ref 150–400)
RBC: 2.49 MIL/uL — ABNORMAL LOW (ref 4.22–5.81)
RDW: 13.2 % (ref 11.5–15.5)
WBC: 11.2 10*3/uL — ABNORMAL HIGH (ref 4.0–10.5)
nRBC: 0 % (ref 0.0–0.2)

## 2018-12-10 LAB — BASIC METABOLIC PANEL
Anion gap: 7 (ref 5–15)
BUN: 8 mg/dL (ref 8–23)
CALCIUM: 8.3 mg/dL — AB (ref 8.9–10.3)
CO2: 28 mmol/L (ref 22–32)
Chloride: 101 mmol/L (ref 98–111)
Creatinine, Ser: 0.64 mg/dL (ref 0.61–1.24)
GFR calc Af Amer: >60 mL/min (ref >60)
GFR calc non Af Amer: >60 mL/min (ref >60)
Glucose, Bld: 153 mg/dL — ABNORMAL HIGH (ref 70–99)
Potassium: 4.4 mmol/L (ref 3.5–5.1)
Sodium: 136 mmol/L (ref 135–145)

## 2018-12-10 LAB — GLUCOSE, CAPILLARY
Glucose-Capillary: 172 mg/dL — ABNORMAL HIGH (ref 70–99)
Glucose-Capillary: 166 mg/dL — ABNORMAL HIGH (ref 70–99)
Glucose-Capillary: 199 mg/dL — ABNORMAL HIGH (ref 70–99)
Glucose-Capillary: 182 mg/dL — ABNORMAL HIGH (ref 70–99)

## 2018-12-10 LAB — MAGNESIUM: Magnesium: 1.8 mg/dL (ref 1.7–2.4)

## 2018-12-10 NOTE — Progress Notes (Signed)
Orthopedic Tech Progress Note Patient Details:  Clayton [name] [birth year] [id number]  Patient ID: Clayton [name], male   DOB: 1951/08/19, 68 y.o.   MRN: [id number]   Janit Pagan 12/10/2018, 11:03 AM

## 2018-12-10 NOTE — TOC Initial Note (Addendum)
Transition of Care [facility]) - Initial/Assessment Note    Patient Details  Name: Clayton [name] MRN: [id number] Date of Birth: 28-Jan-1951  Transition of Care [facility]) CM/SW Contact:    Vinie Sill, [place] Phone Number: 12/10/2018, 1:09 PM  Clinical Narrative:                 CSW called and talked with the patient's sister, Peggyann Juba. She confirmed the patient is from [facility], [facility], [facility] for rehab. The family would like for him to return to [facility] for [place] rehab once discharged from hospital. Patient's sister requested CSW called patient's significant other as requested by patients' sister to inform about discharge plan.  CSW has contacted [facility] and is aware the patient will return there for ST rehab.   Patient's family is realistic regarding therapy needs and expressed being hopeful the patient will get stronger so he can return home. Family expressed understanding of CSW role and discharge process as well as medical condition. No questions/concerns about plan or treatment at this time.  Expected Discharge Plan: Skilled Nursing Facility Barriers to Discharge: Continued Medical Work up   Patient Goals and CMS Choice Patient states their goals for this hospitalization and ongoing recovery are:: family wants to pateint to be able to come home CMS Medicare.gov Compare Post Acute Care list provided to:: Patient Represenative (must comment)(family ready for pateint to get better and come home) Choice offered to / list presented to : Sibling  Expected Discharge Plan and Services Expected Discharge Plan: [place] In-house Referral: Clinical Social Work Discharge Planning Services: NA Post Acute Care Choice: [place] Living arrangements for the past 2 months: Single Family Home Expected Discharge Date: 12/14/18               DME Arranged: N/A DME Agency: NA HH Arranged: NA [place] Agency: NA  Prior Living Arrangements/Services Living arrangements  for the past 2 months: [place] Lives with:: Self Patient language and need for interpreter reviewed:: No Do you feel safe going back to the place where you live?: Yes(from SNF returning back to SNF)      Need for Family Participation in Patient Care: Yes (Comment) Care giver support system in place?: Yes (comment)   Criminal Activity/Legal Involvement Pertinent to Current Situation/Hospitalization: No - Comment as needed  Activities of Daily Living Home Assistive Devices/Equipment: Other (Comment)(from facility) ADL Screening (condition at time of admission) Patient's cognitive ability adequate to safely complete daily activities?: No Is the patient deaf or have difficulty hearing?: No Does the patient have difficulty seeing, even when wearing glasses/contacts?: No Does the patient have difficulty concentrating, remembering, or making decisions?: Yes Patient able to express need for assistance with ADLs?: Yes Does the patient have difficulty dressing or bathing?: No Independently performs ADLs?: No Communication: Independent Dressing (OT): Needs assistance Is this a change from baseline?: Pre-admission baseline Grooming: Needs assistance Is this a change from baseline?: Pre-admission baseline Feeding: Independent Bathing: Needs assistance Is this a change from baseline?: Pre-admission baseline Toileting: Needs assistance Is this a change from baseline?: Pre-admission baseline In/Out Bed: Needs assistance Is this a change from baseline?: Pre-admission baseline Walks in Home: Needs assistance Is this a change from baseline?: Pre-admission baseline Does the patient have difficulty walking or climbing stairs?: Yes Weakness of Legs: Both Weakness of Arms/Hands: Left  Permission Sought/Granted Permission sought to share information with : Family Supports, Chartered certified accountant granted to share information with : Yes, Verbal Permission Granted(patient  confused- talked with his sister )  Share Information with NAME: Carmelina Peal  Permission granted to share info w AGENCY: [place] granted to share info w Relationship: sister  Permission granted to share info w Contact Information: [phone]  Emotional Assessment Appearance:: Appears stated age Attitude/Demeanor/Rapport: Engaged Affect (typically observed): Appropriate Orientation: : Oriented to Self, Oriented to Place Alcohol / Substance Use: Not Applicable Psych Involvement: No (comment)  Admission diagnosis:  PAD (peripheral artery disease) ([place]) [I73.9] Patient Active Problem List   Diagnosis Date Noted  . PAD (peripheral artery disease) ([place]) 12/07/2018  . Acute on chronic respiratory failure with hypoxia ([place])   . COPD, severe ([place])   . Pulmonary arterial hypertension ([place])   . Non-STEMI (non-ST elevated myocardial infarction) ([place])   . Embolic stroke involving right middle cerebral artery ([place])   . Altered mental status   . Tracheostomy status ([place])   . Stroke (cerebrum) ([place]) 08/28/2018  . Middle cerebral artery embolism, right 08/28/2018  . Elevated LFTs 08/24/2018  . AKI (acute kidney injury) ([place]) 08/24/2018  . Acute gastroenteritis 08/24/2018  . HTN (hypertension) 08/24/2018  . Dehydration 08/24/2018  . Guaiac positive stools 07/03/2018  . Acute on chronic respiratory failure with hypoxia ([place]) 05/13/2017  . Elevated troponin 05/13/2017  . Respiratory failure ([place]) 06/28/2015  . COPD exacerbation ([place]) 06/28/2015  . DM type 2 (diabetes mellitus, type 2) ([place]) 06/28/2015  . Erectile dysfunction associated with type 2 diabetes mellitus ([place]) 07/12/2014  . CAD S/P percutaneous coronary angioplasty 04/11/2014  . Prostate cancer ([place]) 04/27/2012  . Abnormal ECG 01/06/2012  . TOBACCO ABUSE 11/23/2010  . GERD 12/12/2009  . DIVERTICULOSIS, COLON 12/12/2009  . COLONIC POLYPS, BENIGN, HX OF 12/12/2009  . ANEMIA 12/04/2009  . HYPERLIPIDEMIA  09/15/2008  . MYOCARDIAL INFARCTION, HX OF 09/15/2008  . PERIPHERAL VASCULAR DISEASE 09/15/2008  . UNSPECIFIED IRON DEFICIENCY ANEMIA 02/15/2008  . BLOOD IN STOOL, OCCULT 02/15/2008   PCP:  Dione Housekeeper, MD Pharmacy:   [id number] [place], [address] [place] [address] Phone: [phone] Fax: [phone]     Social Determinants of Health (SDOH) Interventions    Readmission Risk Interventions No flowsheet data found.

## 2018-12-10 NOTE — Progress Notes (Addendum)
  Progress Note  VASCULAR SURGERY ASSESSMENT & PLAN:   POD 2: right AKA.  This is healing well.  Continue dressing changes.  Nonhealing wound left second toe.  The plan is for left lower extremity revascularization in the future after he is recovered from this.  CARDIAC: Appreciate cardiology's help.  They were consulted for a brief run of nonsustained V. tach.  Patient is currently in sinus rhythm and his rate is well controlled  DISPOSITION: Awaiting skilled nursing facility.  Deitra Mayo, MD, FACS Beeper [phone] Office: [phone]   12/10/2018 7:17 AM 2 Days Post-Op  Subjective:  No complaints of pain  Tm 99.5 HR 80's-90's 254'D-826'E systolic 15% RA  Vitals:   12/10/18 0152 12/10/18 0454  BP:  (!) 145/88  Pulse:  86  Resp: 20 19  Temp:  99.5 F (37.5 C)  SpO2:  91%    Physical Exam: Incisions:  Clean and dry with scant bloody ooze and staples in tact   CBC    Component Value Date/Time   WBC 11.2 (H) 12/10/2018 0222   RBC 2.49 (L) 12/10/2018 0222   HGB 7.6 (L) 12/10/2018 0222   HCT 24.9 (L) 12/10/2018 0222   PLT 314 12/10/2018 0222   MCV 100.0 12/10/2018 0222   MCH 30.5 12/10/2018 0222   MCHC 30.5 12/10/2018 0222   RDW 13.2 12/10/2018 0222   LYMPHSABS 0.4 (L) 08/28/2018 1002   MONOABS 0.4 08/28/2018 1002   EOSABS 0.0 08/28/2018 1002   BASOSABS 0.0 08/28/2018 1002    BMET    Component Value Date/Time   NA 136 12/10/2018 0222   K 4.4 12/10/2018 0222   CL 101 12/10/2018 0222   CO2 28 12/10/2018 0222   GLUCOSE 153 (H) 12/10/2018 0222   BUN 8 12/10/2018 0222   CREATININE 0.64 12/10/2018 0222   CALCIUM 8.3 (L) 12/10/2018 0222   GFRNONAA >60 12/10/2018 0222   GFRAA >60 12/10/2018 0222    INR    Component Value Date/Time   INR 1.18 09/06/2018 0650     Intake/Output Summary (Last 24 hours) at 12/10/2018 0717 Last data filed at 12/09/2018 1857 Gross per 24 hour  Intake 1401.81 ml  Output 300 ml  Net 1101.81 ml      Assessment/Plan:  68 y.o. male is s/p right above knee amputation  2 Days Post-Op  -pt doing well.  Dressing removed today and incision is clean and dry with some scant bloody drainage.  Will ask biotech to place stump sock.   -appreciate cardiology assistance with this pt.   Leontine Locket, PA-C [facility] [phone] 12/10/2018 7:17 AM

## 2018-12-10 NOTE — Progress Notes (Signed)
Orthopedic Tech Progress Note Patient Details:  Clayton [name] Sep 03, 1951 [id number] Called in Order fr [employer] Patient ID: Clayton [name], male   DOB: 1951-06-26, 68 y.o.   MRN: [id number]   Clayton [name] 12/10/2018, 11:02 AM

## 2018-12-10 NOTE — Plan of Care (Signed)
  Problem: Clinical Measurements: Goal: Respiratory complications will improve Outcome: Progressing   Problem: Pain Managment: Goal: General experience of comfort will improve Outcome: Progressing   

## 2018-12-10 NOTE — Progress Notes (Addendum)
Progress Note  Patient Name: Clayton [name] Date of Encounter: 12/10/2018  Primary Cardiologist: Clayton Dolly, MD   Subjective   No significant overnight events. Patient doing well this morning although still has baseline confusion (question whether he has dementia, possibly secondary to recent stroke, but this is not listed in his chart). No chest pain, shortness of breath, palpitations, lightheadedness, or dizziness. His only complaint is some right leg pain following amputation.  Inpatient Medications    Scheduled Meds: . aspirin  81 mg Oral Daily  . atorvastatin  40 mg Oral QPM  . carvedilol  6.25 mg Oral BID WC  . Chlorhexidine Gluconate Cloth  6 each Topical Q0600  . docusate  100 mg Oral Daily  . heparin  5,000 Units Subcutaneous Q8H  . insulin aspart  0-15 Units Subcutaneous TID WC  . insulin aspart  0-5 Units Subcutaneous QHS  . insulin aspart  4 Units Subcutaneous TID WC  . mupirocin ointment  1 application Nasal BID  . pantoprazole  40 mg Oral Daily  . predniSONE  10 mg Oral Q breakfast  . sodium chloride flush  3 mL Intravenous Q12H   Continuous Infusions: . sodium chloride Stopped (12/08/18 0831)  . sodium chloride Stopped (12/09/18 0546)  . magnesium sulfate 1 - 4 g bolus IVPB     PRN Meds: sodium chloride, acetaminophen, alum & mag hydroxide-simeth, bisacodyl, guaiFENesin, hydrALAZINE, labetalol, magnesium sulfate 1 - 4 g bolus IVPB, metoprolol tartrate, morphine injection, ondansetron (ZOFRAN) IV, oxyCODONE, phenol, polyethylene glycol, potassium chloride, sodium chloride flush   Vital Signs    Vitals:   12/09/18 1702 12/09/18 1925 12/10/18 0152 12/10/18 0454  BP: 116/68 100/62  (!) 145/88  Pulse:  90  86  Resp: (!) 21 (!) 26 20 19   Temp:  98.6 F (37 C)  99.5 F (37.5 C)  TempSrc:  Oral  Oral  SpO2:  96%  91%  Weight:      Height:        Intake/Output Summary (Last 24 hours) at 12/10/2018 0807 Last data filed at 12/09/2018 1857 Gross per 24  hour  Intake 1401.81 ml  Output 300 ml  Net 1101.81 ml   Filed Weights   12/07/18 0557  Weight: 90.7 kg    Telemetry    Sinus rhythm with heart rates in the 80's to 90's and frequent PACs/PVCs. Brief episode of sinus tachycardia vs. SVT of about 30 beats but no further episodes of non-sustained VT since yesterday.  - Personally Reviewed  ECG    No new ECG tracing. - Personally Reviewed  Physical Exam   GEN: African-American  male resting comfortably. Alert and in no acute distress.  Neck: Supple. No  Cardiac: RRR. No murmurs, gallops, or rubs.  Respiratory: Clear to auscultation bilaterally. No wheezes, rhonchi, or rales. GI: Abdomen soft, non-distended, and non-tender. MS: No lower extremity edema. S/p right above the knee amputation.  Skin: Warm and dry. Neuro:  No focal deficits. Psych: Baseline confusion. Responds appropriately.  Labs    Chemistry Recent Labs  Lab 12/08/18 0253 12/09/18 0241 12/10/18 0222  NA 136 135 136  K 3.6 3.7 4.4  CL 100 101 101  CO2 28 28 28   GLUCOSE 249* 180* 153*  BUN 7* 6* 8  CREATININE 0.66 0.54* 0.64  CALCIUM 7.6* 7.6* 8.3*  GFRNONAA >60 >60 >60  GFRAA >60 >60 >60  ANIONGAP 8 6 7      Hematology Recent Labs  Lab 12/08/18 0253 12/09/18 0241 12/10/18  0222  WBC 9.2 13.1* 11.2*  RBC 2.33* 2.42* 2.49*  HGB 7.1* 7.6* 7.6*  HCT 23.2* 24.1* 24.9*  MCV 99.6 99.6 100.0  MCH 30.5 31.4 30.5  MCHC 30.6 31.5 30.5  RDW 13.1 12.8 13.2  PLT 288 294 314    Cardiac EnzymesNo results for input(s): TROPONINI in the last 168 hours. No results for input(s): TROPIPOC in the last 168 hours.   BNPNo results for input(s): BNP, PROBNP in the last 168 hours.   DDimer No results for input(s): DDIMER in the last 168 hours.   Radiology    No results found.  Cardiac Studies   Echocardiogram 09/01/2018: Study Conclusions: - Left ventricle: The cavity size was normal. Systolic function was normal. The estimated ejection fraction was  in the range of 50% to 55%. Wall motion was normal; there were no regional wall motion abnormalities. - Ventricular septum: Septal motion showed abnormal function and dyssynergy. - Right ventricle: The cavity size was moderately dilated. Wall thickness was normal. Systolic function was moderately reduced. - Right atrium: The atrium was mildly dilated. - Inferior vena cava: The vessel was normal in size.  Impressions: - Limited study with agitated saline, negative bubble study. No ASD or PFO.  Patient Profile   [name] is a 68 y.o. male with a history of history of CAD s/p BMS x3 to RCA in 2009, hypertension, hyperlipidemia, type 2 diabetes mellitus, embolic stroke in 04/6577, COPD, pulmonary arterial hypertension, peripheral vascular disease, who is being seen today for the evaluation of wide complex tachycardia at the request of Dr. Donzetta [name].  Assessment & Plan    Non-Sustained VT - Cardiology consulted on 3/25 for brief run of non-sustained VT with rate of about 100 bpm before returning to normal sinus rhythm. Potassium was 3.7. Magnesium was 1.1. Both were repleted. - Currently in sinus rhythm on telemetry with heart rates in the 80's to 90's. Had a brief episode of sinus tachycardia vs. SVT of about 30 beats but no further episodes of non-sustained VT since yesterday. - LVEF of 50-55% on Echo in 08/2018.  - Potassium 4.4 today. Goal > 4.0. Replete as needed. - Magnesium 1.8 today. Goal > 2.0. Replete as needed.  - Continue Coreg 6.25mg  twice daily. Consider up-titrating dose for further BP control.   CAD  - Patient has a history of CAD with inferior wall MI in 2009 s/p PCI with 3 overlapping BMS. - Patient denies any chest pain or other cardiac symptoms. - Continue aspirin, beta-blocker (if BP allows), and high-intensity statin.  Hypertension - Most recent BP 145/88.  - Continue Coreg as above.  Hyperlipidemia - Continue home statin.  Type 2 Diabetes Mellitus -  Continue sliding scale insulin per primary team.  S/p Right AKA - Management per primary team.  For questions or updates, please contact [place] Please consult [email or web address] for contact info under Cardiology/STEMI.      Signed, Clayton Mclean, PA-C  12/10/2018, 8:07 AM   As above, patient seen and examined.  Patient denies dyspnea or chest pain.  No palpitations.  Telemetry shows brief PAT but no further ventricular tachycardia.  Electrolytes improved following supplementation.  Continue beta-blocker.  No indication for further cardiac evaluation.  [facility] will sign off.   Medication Recommendations: Continue present medications as listed in MAR. Other recommendations (labs, testing, etc): No further cardiac testing. Follow up as an outpatient: Dr. Harl Bowie 8 to 12 weeks following discharge  Kirk Ruths, MD

## 2018-12-10 NOTE — Care Management Important Message (Signed)
Important Message  Patient Details  Name: Clayton [name] MRN: [id number] Date of Birth: Jan 23, 1951   Medicare Important Message Given:  Yes    Lakashia Collison 12/10/2018, 3:02 PM

## 2018-12-11 LAB — GLUCOSE, CAPILLARY
Glucose-Capillary: 165 mg/dL — ABNORMAL HIGH (ref 70–99)
Glucose-Capillary: 155 mg/dL — ABNORMAL HIGH (ref 70–99)

## 2018-12-11 MED ORDER — OXYCODONE HCL 5 MG PO TABS: 5.0000 mg | ORAL_TABLET | Freq: Four times a day (QID) | ORAL | 0 refills | Status: DC | PRN

## 2018-12-11 NOTE — Progress Notes (Signed)
  Progress Note    12/11/2018 7:34 AM 3 Days Post-Op  Subjective:  Patient states he really has not had much pain   Vitals:   12/10/18 1928 12/11/18 0500  BP: (!) 112/58 130/74  Pulse: (!) 26   Resp: (!) 30 (!) 21  Temp: 99.9 F (37.7 C) 100 F (37.8 C)  SpO2: 100% 96%    Physical Exam: Incisions:  R AKA incision without breakthrough bleeding; stump sock left in place   CBC    Component Value Date/Time   WBC 11.2 (H) 12/10/2018 0222   RBC 2.49 (L) 12/10/2018 0222   HGB 7.6 (L) 12/10/2018 0222   HCT 24.9 (L) 12/10/2018 0222   PLT 314 12/10/2018 0222   MCV 100.0 12/10/2018 0222   MCH 30.5 12/10/2018 0222   MCHC 30.5 12/10/2018 0222   RDW 13.2 12/10/2018 0222   LYMPHSABS 0.4 (L) 08/28/2018 1002   MONOABS 0.4 08/28/2018 1002   EOSABS 0.0 08/28/2018 1002   BASOSABS 0.0 08/28/2018 1002    BMET    Component Value Date/Time   NA 136 12/10/2018 0222   K 4.4 12/10/2018 0222   CL 101 12/10/2018 0222   CO2 28 12/10/2018 0222   GLUCOSE 153 (H) 12/10/2018 0222   BUN 8 12/10/2018 0222   CREATININE 0.64 12/10/2018 0222   CALCIUM 8.3 (L) 12/10/2018 0222   GFRNONAA >60 12/10/2018 0222   GFRAA >60 12/10/2018 0222    INR    Component Value Date/Time   INR 1.18 09/06/2018 0650     Intake/Output Summary (Last 24 hours) at 12/11/2018 0734 Last data filed at 12/11/2018 0521 Gross per 24 hour  Intake 1070 ml  Output 2300 ml  Net -1230 ml     Assessment/Plan:  68 y.o. male is s/p right above knee amputation  3 Days Post-Op  - R AKA unremarkable - Discuss possible bypass surgery LLE when recovered from AKA - D/c to SNF when bed available and approved - Appreciate Cardiology input   Dagoberto Ligas, PA-C [facility] [phone] 12/11/2018 7:34 AM

## 2018-12-11 NOTE — TOC Transition Note (Signed)
Transition of Care [facility]) - CM/SW Discharge Note   Patient Details  Name: Clayton [name] MRN: [id number] Date of Birth: May 12, 1951  [facility]) CM/SW Contact:  Vinie Sill, [place] Phone Number: 12/11/2018, 2:12 PM   Clinical Narrative:     Patient will DC to: [facility] DC Date: 12/11/2018 Family Notified: Peggyann Juba, sister Transport By: Corey Harold  RN, patient, and facility notified of DC. Discharge Summary sent to facility. RN given number for [phone]. Ambulance transport requested for patient.   Clinical Social Worker signing off. Thurmond Butts, MSW, LCSWA Clinical Social Worker [phone]      Barriers to Discharge: Continued Medical Work up   Patient Goals and CMS Choice Patient states their goals for this hospitalization and ongoing recovery are:: family wants to pateint to be able to come home CMS Medicare.gov Compare Post Acute Care list provided to:: Patient Represenative (must comment)(family ready for pateint to get better and come home) Choice offered to / list presented to : Sibling  Discharge Placement                       Discharge Plan and Services In-house Referral: Clinical Social Work Discharge Planning Services: NA Post Acute Care Choice: [place]          DME Arranged: N/A DME Agency: NA HH Arranged: NA HH Agency: NA   Social Determinants of Health (SDOH) Interventions     Readmission Risk Interventions Readmission Risk Prevention Plan 12/11/2018  Transportation Screening Complete  Medication Review Press photographer) Complete  PCP or Specialist appointment within 3-5 days of discharge Complete  HRI or Home Care Consult Complete  SW Recovery Care/Counseling Consult Complete  Palliative Care Screening Not Applicable  Skilled Nursing Facility Complete  Some recent data might be hidden

## 2018-12-11 NOTE — Progress Notes (Signed)
Occupational Therapy Treatment Patient Details Name: Clayton [name] MRN: [id number] DOB: 11/15/1950 Today's Date: 12/11/2018    History of present illness 68 y.o. 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 Pt with other significant PMH of PVD, prostate CA, HTN, DM, COPD, CAD, cardiac stent placement, and bil cataract surgery.   OT comments  Pt progressing towards OT goals this session. Pt cognitive deficits remain - confused, decreased STM. Pt was able to perform bed mobility at mod A +2 assist, then worked on seated activity tolerance and balance EOB (mod to min guard balance) for grooming, and amputee exercises as well as UB exercises to assist with ADL and wheelchair propulsion. OT will continue to follow acutely, and Pt continues to require SNF level care at discharge.    Follow Up Recommendations  SNF;Supervision/Assistance - 24 hour    Equipment Recommendations  Other (comment)(defer to next venue)    Recommendations for Other Services      Precautions / Restrictions Precautions Precautions: Fall Restrictions Weight Bearing Restrictions: Yes RLE Weight Bearing: Non weight bearing       Mobility Bed Mobility Overal bed mobility: Needs Assistance Bed Mobility: Supine to Sit     Supine to sit: Mod assist;+2 for physical assistance;+2 for safety/equipment;HOB elevated     General bed mobility comments: assist to move LLE to EOB, vc for hand placement and use of pad to bring hips EOB, utilized therapist for assist to elevate trunk  Transfers                 General transfer comment: NT this session - Pt pending transport and RN requested Pt in bed at end of session    Balance Overall balance assessment: Needs assistance Sitting-balance support: Bilateral upper extremity supported;Feet supported Sitting balance-Leahy Scale: Poor Sitting balance - Comments: requires mod A able to progress to min guard but fatigues quickly                                    ADL either performed or assessed with clinical judgement   ADL Overall ADL's : Needs assistance/impaired     Grooming: Minimal assistance;Sitting;Wash/dry hands;Wash/dry face Grooming Details (indicate cue type and reason): requires support in seated position     Lower Body Bathing: Maximal assistance Lower Body Bathing Details (indicate cue type and reason): washing back while patient worked on balance EOB     Lower Body Dressing: Maximal assistance;Sitting/lateral leans Lower Body Dressing Details (indicate cue type and reason): unable to access LLE for dressing, is able to assist minimally with lateral leans               General ADL Comments: Pt pleasantly confused     Vision       Perception     Praxis      Cognition Arousal/Alertness: Lethargic(arouses with verbal cues) Behavior During Therapy: WFL for tasks assessed/performed Overall Cognitive Status: Impaired/Different from baseline Area of Impairment: Attention;Memory;Following commands;Safety/judgement;Awareness;Problem solving                   Current Attention Level: Focused Memory: Decreased short-term memory;Decreased recall of precautions Following Commands: Follows one step commands with increased time;Follows one step commands inconsistently Safety/Judgement: Decreased awareness of safety;Decreased awareness of deficits Awareness: Intellectual Problem Solving: Decreased initiation;Difficulty sequencing;Requires verbal cues;Requires tactile cues General Comments: Pt pleasant throughout session, required verbal and physical cues for all movement  Exercises Exercises: Amputee;Other exercises Other Exercises Other Exercises: Pt able to perform shoulder FF and overhead extension x20 (10 each arm)   Shoulder Instructions       General Comments      Pertinent Vitals/ Pain       Pain Assessment: Faces Faces Pain Scale: Hurts even more Pain Location: R residual  limb Pain Descriptors / Indicators: Discomfort;Sore Pain Intervention(s): Monitored during session;Repositioned;Limited activity within patient's tolerance  Home Living                                          Prior Functioning/Environment              Frequency  Min 2X/week        Progress Toward Goals  OT Goals(current goals can now be found in the care plan section)  Progress towards OT goals: Progressing toward goals  Acute Rehab OT Goals Patient Stated Goal: get back to dating OT Goal Formulation: With patient Time For Goal Achievement: 12/23/18 Potential to Achieve Goals: Good  Plan Discharge plan remains appropriate;Frequency remains appropriate    Co-evaluation    PT/OT/SLP Co-Evaluation/Treatment: Yes Reason for Co-Treatment: Necessary to address cognition/behavior during functional activity;For patient/therapist safety;To address functional/ADL transfers PT goals addressed during session: Mobility/safety with mobility;Balance;Strengthening/ROM OT goals addressed during session: ADL's and self-care;Strengthening/ROM      AM-PAC OT "6 Clicks" Daily Activity     Outcome Measure   Help from another person eating meals?: A Little Help from another person taking care of personal grooming?: A Little Help from another person toileting, which includes using toliet, bedpan, or urinal?: Total Help from another person bathing (including washing, rinsing, drying)?: A Lot Help from another person to put on and taking off regular upper body clothing?: A Lot Help from another person to put on and taking off regular lower body clothing?: Total 6 Click Score: 12    End of Session    OT Visit Diagnosis: Unsteadiness on feet (R26.81);Other abnormalities of gait and mobility (R26.89);Muscle weakness (generalized) (M62.81);Other symptoms and signs involving cognitive function;Pain Pain - Right/Left: Right Pain - part of body: Leg   Activity Tolerance  Patient tolerated treatment well   Patient Left in bed;with call bell/phone within reach;with bed alarm set   Nurse Communication Mobility status;Precautions;Weight bearing status        Time: 1420-1445 OT Time Calculation (min): 25 min  Charges: OT General Charges $OT Visit: 1 Visit OT Treatments $Self Care/Home Management : 8-22 mins  Hulda Humphrey OTR/L Acute Rehabilitation Services Pager: [phone] Office: [place] 12/11/2018, 2:58 PM

## 2018-12-11 NOTE — Discharge Instructions (Signed)
Incision Care, Adult °An incision is a cut that a doctor makes in your skin for surgery (for a procedure). Most times, these cuts are closed after surgery. Your cut from surgery may be closed with stitches (sutures), staples, skin glue, or skin tape (adhesive strips). You may need to return to your doctor to have stitches or staples taken out. This may happen many days or many weeks after your surgery. The cut needs to be well cared for so it does not get infected. °How to care for your cut °Cut care ° °· Follow instructions from your doctor about how to take care of your cut. Make sure you: °? Wash your hands with soap and water before you change your bandage (dressing). If you cannot use soap and water, use hand sanitizer. °? Change your bandage as told by your doctor. °? Leave stitches, skin glue, or skin tape in place. They may need to stay in place for 2 weeks or longer. If tape strips get loose and curl up, you may trim the loose edges. Do not remove tape strips completely unless your doctor says it is okay. °· Check your cut area every day for signs of infection. Check for: °? More redness, swelling, or pain. °? More fluid or blood. °? Warmth. °? Pus or a bad smell. °· Ask your doctor how to clean the cut. This may include: °? Using mild soap and water. °? Using a clean towel to pat the cut dry after you clean it. °? Putting a cream or ointment on the cut. Do this only as told by your doctor. °? Covering the cut with a clean bandage. °· Ask your doctor when you can leave the cut uncovered. °· Do not take baths, swim, or use a hot tub until your doctor says it is okay. Ask your doctor if you can take showers. You may only be allowed to take sponge baths for bathing. °Medicines °· If you were prescribed an antibiotic medicine, cream, or ointment, take the antibiotic or put it on the cut as told by your doctor. Do not stop taking or putting on the antibiotic even if your condition gets better. °· Take  over-the-counter and prescription medicines only as told by your doctor. °General instructions °· Limit movement around your cut. This helps healing. °? Avoid straining, lifting, or exercise for the first month, or for as long as told by your doctor. °? Follow instructions from your doctor about going back to your normal activities. °? Ask your doctor what activities are safe. °· Protect your cut from the sun when you are outside for the first 6 months, or for as long as told by your doctor. Put on sunscreen around the scar or cover up the scar. °· Keep all follow-up visits as told by your doctor. This is important. °Contact a doctor if: °· Your have more redness, swelling, or pain around the cut. °· You have more fluid or blood coming from the cut. °· Your cut feels warm to the touch. °· You have pus or a bad smell coming from the cut. °· You have a fever or shaking chills. °· You feel sick to your stomach (nauseous) or you throw up (vomit). °· You are dizzy. °· Your stitches or staples come undone. °Get help right away if: °· You have a red streak coming from your cut. °· Your cut bleeds through the bandage and the bleeding does not stop with gentle pressure. °· The edges of your cut   open up and separate. °· You have very bad (severe) pain. °· You have a rash. °· You are confused. °· You pass out (faint). °· You have trouble breathing and you have a fast heartbeat. °This information is not intended to replace advice given to you by your health care provider. Make sure you discuss any questions you have with your health care provider. °Document Released: 11/25/2011 Document Revised: 05/10/2016 Document Reviewed: 05/10/2016 °Elsevier Interactive Patient Education © 2019 [employer] ° °

## 2018-12-11 NOTE — Progress Notes (Signed)
Patient's sister Peggyann Juba called for an update on patient's condition.  Updated that patient is to be discharged to SNF and awaiting bed placement.

## 2018-12-11 NOTE — Progress Notes (Signed)
Physical Therapy Treatment Patient Details Name: Clayton [name] MRN: [id number] DOB: [birth year] Today's Date: 12/11/2018    History of Present Illness 68 y.o. 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 Pt with other significant PMH of PVD, prostate CA, HTN, DM, COPD, CAD, cardiac stent placement, and bil cataract surgery.    PT Comments    Pt agreeable to therapy, but essentially minimally participative.  Emphasis on AKA exercise, transitions to EOB and balance EOB while completing UE activities for strengthening and balance challenge.    Follow Up Recommendations  SNF;Supervision/Assistance - 24 hour     Equipment Recommendations  Other (comment)(TBA next venue)    Recommendations for Other Services       Precautions / Restrictions Precautions Precautions: Fall Restrictions Weight Bearing Restrictions: Yes RLE Weight Bearing: Non weight bearing    Mobility  Bed Mobility Overal bed mobility: Needs Assistance Bed Mobility: Supine to Sit     Supine to sit: Mod assist;+2 for physical assistance;+2 for safety/equipment;HOB elevated     General bed mobility comments: assist to move LLE to EOB, vc for hand placement and use of pad to bring hips EOB, utilized therapist for assist to elevate trunk  Transfers                 General transfer comment: NT this session - Pt pending transport and RN requested Pt in bed at end of session  Ambulation/Gait                 Stairs             Wheelchair Mobility    Modified Rankin (Stroke Patients Only)       Balance Overall balance assessment: Needs assistance Sitting-balance support: Bilateral upper extremity supported;Feet supported Sitting balance-Leahy Scale: Poor Sitting balance - Comments: requires mod A able to progress to min guard but fatigues quickly.  10 or more min  working on balance and completing bil UE activity.                                    Cognition  Arousal/Alertness: Lethargic(arouses with verbal cues) Behavior During Therapy: WFL for tasks assessed/performed Overall Cognitive Status: Impaired/Different from baseline Area of Impairment: Attention;Memory;Following commands;Safety/judgement;Awareness;Problem solving                   Current Attention Level: Focused Memory: Decreased short-term memory;Decreased recall of precautions Following Commands: Follows one step commands with increased time;Follows one step commands inconsistently Safety/Judgement: Decreased awareness of safety;Decreased awareness of deficits Awareness: Intellectual Problem Solving: Decreased initiation;Difficulty sequencing;Requires verbal cues;Requires tactile cues General Comments: Pt pleasant throughout session, required verbal and physical cues for all movement      Exercises Amputee Exercises Hip Extension: AAROM;Right;5 reps Hip ABduction/ADduction: PROM;AAROM;10 reps;Right Hip Flexion/Marching: AROM;Right;10 reps Other Exercises Other Exercises: Pt able to perform shoulder FF and overhead extension x20 (10 each arm)    General Comments        Pertinent Vitals/Pain Pain Assessment: Faces Faces Pain Scale: Hurts even more Pain Location: R residual limb Pain Descriptors / Indicators: Discomfort;Sore Pain Intervention(s): Monitored during session    Home Living                      Prior Function            PT Goals (current goals can now be found in the care plan  section) Acute Rehab PT Goals Patient Stated Goal: get back to dating PT Goal Formulation: With patient Time For Goal Achievement: 12/23/18 Potential to Achieve Goals: Fair Progress towards PT goals: Progressing toward goals(minimal progress)    Frequency    Min 2X/week      PT Plan Current plan remains appropriate    Co-evaluation PT/OT/SLP Co-Evaluation/Treatment: Yes Reason for Co-Treatment: Necessary to address cognition/behavior during  functional activity PT goals addressed during session: Mobility/safety with mobility OT goals addressed during session: ADL's and self-care      AM-PAC PT "6 Clicks" Mobility   Outcome Measure  Help needed turning from your back to your side while in a flat bed without using bedrails?: A Lot Help needed moving from lying on your back to sitting on the side of a flat bed without using bedrails?: A Lot Help needed moving to and from a bed to a chair (including a wheelchair)?: Total Help needed standing up from a chair using your arms (e.g., wheelchair or bedside chair)?: Total Help needed to walk in hospital room?: Total Help needed climbing 3-5 steps with a railing? : Total 6 Click Score: 8    End of Session   Activity Tolerance: Patient tolerated treatment well Patient left: in bed;with call bell/phone within reach Nurse Communication: Mobility status PT Visit Diagnosis: Other abnormalities of gait and mobility (R26.89);Muscle weakness (generalized) (M62.81);Pain Pain - Right/Left: Right Pain - part of body: Leg     Time: 1420-1445 PT Time Calculation (min) (ACUTE ONLY): 25 min  Charges:  $Therapeutic Exercise: 8-22 mins                     12/11/2018  Donnella Sham, PT Acute Rehabilitation Services [phone]  (pager) [phone]  (office)   Tessie Fass Gurpreet Mariani 12/11/2018, 3:14 PM

## 2018-12-11 NOTE — Discharge Summary (Signed)
Physician Discharge Summary   Patient ID: Clayton [name] [id number] 67 y.o. 22-Mar-1951  Admit date: 12/07/2018  Discharge date and time: 12/11/18   Admitting Physician: Waynetta Sandy, MD   Discharge Physician: Dr. Scot Dock  Admission Diagnoses: PAD (peripheral artery disease) [facility]) [I73.9]  Discharge Diagnoses: same  Admission Condition: fair  Discharged Condition: fair  Indication for Admission: Critical limb ischemia  Hospital Course: [name] is a 68 year old male who was brought in with critical limb ischemia of right lower extremity and underwent right above-the-knee amputation by Dr. Donzetta Matters on 12/08/2018.  He tolerated the procedure well and was admitted postoperatively.  Clinical social work was consulted for placement back to skilled nursing facility.  Right stump incision was unremarkable throughout his hospital stay.  Patient did have a run of nonsustained ventricular tachycardia and thus cardiology was consulted.  This was not noted again during hospitalization and patient will follow-up with his cardiologist Dr. Harl Bowie in 8 to 12 weeks after discharge.  He will follow-up in our office with Dr. Donzetta Matters in about 4 to 6 weeks for staple removal.  If his amputation is healed at that time Dr. Donzetta Matters will discuss bypass surgery of left lower extremity with the patient.  He will be prescribed 2 to 3 days of narcotic pain medication for continued postoperative pain control.  He will be discharged to skilled nursing facility today in stable condition.  Consults: cardiology  Treatments: surgery: Right above-the-knee amputation by Dr. Donzetta Matters on 12/08/2018  Discharge Exam: See progress note 12/11/2018 Vitals:   12/11/18 1122 12/11/18 1207  BP:  115/62  Pulse: 85 79  Resp: (!) 25 20  Temp:  99.7 F (37.6 C)  SpO2: 99% 100%     Disposition: Discharge disposition: 03-Skilled Nursing Facility       Patient Instructions:  Allergies as of 12/11/2018   No Known  Allergies     Medication List    TAKE these medications   acetaminophen 325 MG tablet Commonly known as:  TYLENOL Take 650 mg by mouth every 6 (six) hours as needed for moderate pain or fever.   acetaminophen 160 MG/5ML solution Commonly known as:  TYLENOL Place 20.3 mLs (650 mg total) into feeding tube every 4 (four) hours as needed for mild pain (or temp > 37.5 C (99.5 F)).   aspirin 81 MG chewable tablet Place 1 tablet (81 mg total) into feeding tube daily.   atorvastatin 40 MG tablet Commonly known as:  LIPITOR Take 40 mg by mouth every evening.   bisacodyl 10 MG suppository Commonly known as:  DULCOLAX Place 1 suppository (10 mg total) rectally daily as needed for severe constipation.   carvedilol 6.25 MG tablet Commonly known as:  COREG Place 1 tablet (6.25 mg total) into feeding tube 2 (two) times daily with a meal.   chlorhexidine gluconate (MEDLINE KIT) 0.12 % solution Commonly known as:  PERIDEX 15 mLs by Mouth Rinse route 2 (two) times daily.   docusate 50 MG/5ML liquid Commonly known as:  COLACE Take 10 mLs (100 mg total) by mouth daily.   feeding supplement (PRO-STAT SUGAR FREE 64) Liqd Place 30 mLs into feeding tube daily.   feeding supplement (VITAL AF 1.2 CAL) Liqd Place 1,000 mLs into feeding tube continuous.   Fish Oil 1000 MG Caps Take 3,000 mg by mouth 2 (two) times daily.   guaiFENesin 600 MG 12 hr tablet Commonly known as:  MUCINEX Take 600 mg by mouth every 6 (six) hours as needed for  cough or to loosen phlegm.   guaiFENesin 100 MG/5ML Soln Commonly known as:  ROBITUSSIN Take 15 mLs (300 mg total) by mouth every 6 (six) hours as needed for cough or to loosen phlegm.   HumaLOG 100 UNIT/ML injection Generic drug:  insulin lispro Inject 5 Units into the skin 3 (three) times daily before meals.   insulin aspart 100 UNIT/ML injection Commonly known as:  novoLOG Inject 0-20 Units into the skin every 4 (four) hours.   insulin glargine 100  UNIT/ML injection Commonly known as:  LANTUS Inject 0.2 mLs (20 Units total) into the skin every morning.   ipratropium-albuterol 0.5-2.5 (3) MG/3ML Soln Commonly known as:  DUONEB Take 3 mLs by nebulization every 6 (six) hours as needed.   metFORMIN 1000 MG tablet Commonly known as:  GLUCOPHAGE Take 1,000 mg by mouth 2 (two) times daily with a meal.   ondansetron 4 MG/2ML Soln injection Commonly known as:  ZOFRAN Inject 2 mLs (4 mg total) into the vein every 6 (six) hours as needed for nausea or vomiting.   oxyCODONE 5 MG immediate release tablet Commonly known as:  Oxy IR/ROXICODONE Take 1 tablet (5 mg total) by mouth every 6 (six) hours as needed for moderate pain.   pantoprazole sodium 40 mg/20 mL Pack Commonly known as:  PROTONIX Place 20 mLs (40 mg total) into feeding tube daily.   polyethylene glycol packet Commonly known as:  MIRALAX / GLYCOLAX Take 17 g by mouth daily as needed for mild constipation. What changed:  when to take this   predniSONE 10 MG tablet Commonly known as:  DELTASONE Take 10 mg by mouth daily with breakfast.      Activity: activity as tolerated Diet: regular diet Wound Care: keep wound clean and dry  Follow-up with Dr. Donzetta Matters in 4 weeks.  SignedDagoberto Ligas 12/11/2018 1:11 PM

## 2018-12-18 ENCOUNTER — Other Ambulatory Visit: Payer: Self-pay

## 2018-12-18 NOTE — Patient Outreach (Signed)
3 outreach attempts were completed to obtain mRs. mRs could not be obtained because patient never returned my calls. mRs=7 

## 2018-12-22 ENCOUNTER — Telehealth: Payer: Self-pay | Admitting: Vascular Surgery

## 2018-12-22 NOTE — Telephone Encounter (Signed)
sch appt [facility] 01/15/2019 945am staple removal MD

## 2018-12-22 NOTE — Telephone Encounter (Signed)
-----   Message from Dagoberto Ligas, PA-C sent at 12/11/2018  1:17 PM EDT -----  This patient will need an appt with Dr. Donzetta Matters in 4-6 weeks for staple removal and to discuss bypass surgery of LLE.  PO R AKA. Thanks, MAtt

## 2019-01-14 ENCOUNTER — Telehealth (HOSPITAL_COMMUNITY): Payer: Self-pay | Admitting: Rehabilitation

## 2019-01-14 NOTE — Telephone Encounter (Signed)
The above patient or their representative was contacted and gave the following answers to these questions:         Do you have any of the following symptoms? No  Fever                    Cough                   Shortness of breath  Do  you have any of the following other symptoms? No   muscle pain         vomiting,        diarrhea        rash         weakness        red eye        abdominal pain         bruising          bruising or bleeding              joint pain           severe headache    Have you been in contact with someone who was or has been sick in the past 2 weeks? No  Yes                 Unsure                         Unable to assess   Does the person that you were in contact with have any of the following symptoms?   Cough         shortness of breath           muscle pain         vomiting,            diarrhea            rash            weakness           fever            red eye           abdominal pain           bruising  or  bleeding                joint pain                severe headache               Have you  or someone you have been in contact with traveled internationally in th last month? No        If yes, which countries?   Have you  or someone you have been in contact with traveled outside [place] in th last month? No       If yes, which state and city?   COMMENTS OR ACTION PLAN FOR THIS PATIENT:  Questionnaire was answered by "Mariann Laster" from [facility]. Also stated that he has had no contact with any COVID positive patients. I advised that the patient as well as any transporter will need to be masked prior to entering our facility for any appointment. Individual stated understanding of all.

## 2019-01-15 ENCOUNTER — Encounter: Payer: Self-pay | Admitting: Vascular Surgery

## 2019-01-15 ENCOUNTER — Other Ambulatory Visit: Payer: Self-pay

## 2019-01-15 ENCOUNTER — Encounter: Payer: Self-pay | Admitting: *Deleted

## 2019-01-15 ENCOUNTER — Ambulatory Visit: Payer: Medicare Other | Admitting: Vascular Surgery

## 2019-01-15 VITALS — BP 153/88 | HR 79 | Temp 98.7°F | Resp 16 | Ht 72.0 in

## 2019-01-15 DIAGNOSIS — I739 Peripheral vascular disease, unspecified: Secondary | ICD-10-CM

## 2019-01-15 NOTE — Progress Notes (Signed)
Patient ID: Clayton [name], male   DOB: 04-28-1951, 68 y.o.   MRN: [id number]  Reason for Consult: No chief complaint on file.   Referred by Dione Housekeeper, MD  Subjective:     HPI:  Clayton [name] is a 68 y.o. male had bilateral lower extremity ulceration underwent right above-knee amputation.  He is here for follow-up from that.  Surgery was performed March 24 has been healing well.  Also has toe ulceration on the left second toe and heel.  Attempted revascularization of the left lower extremity was unsuccessful.  Did have vein mapping at the time demonstrated suitable vein.  He is here today to discuss surgery of his left lower extremity as well as have staples removed on the right.  Past Medical History:  Diagnosis Date  . Acute on chronic respiratory failure with hypoxia ([place])   . Altered mental status   . CAD (coronary artery disease)    a. s/p BMS x 3 to RCA in 2009  . COPD (chronic obstructive pulmonary disease) ([place])   . COPD, severe ([place])   . Diabetes mellitus   . Embolic stroke involving right middle cerebral artery ([place])   . GERD (gastroesophageal reflux disease)   . Guaiac positive stools 07/03/2018  . HTN (hypertension)   . Hyperlipidemia   . Myocardial infarction ([place]) 2009  . Non-STEMI (non-ST elevated myocardial infarction) ([place])   . Prostate cancer ([place])   . Pulmonary arterial hypertension ([place])   . PVD (peripheral vascular disease) ([facility])    Family History  Problem Relation Age of Onset  . Coronary artery disease Mother   . Coronary artery disease Father   . Prostate cancer Father    Past Surgical History:  Procedure Laterality Date  . AMPUTATION Right 12/08/2018   Procedure: AMPUTATION ABOVE KNEE;  Surgeon: Waynetta Sandy, MD;  Location: [facility];  Service: Vascular;  Laterality: Right;  . CARDIAC CATHETERIZATION     with stent  . cardiac stents  2009  . CATARACT EXTRACTION W/PHACO Left 08/19/2013   Procedure: CATARACT EXTRACTION PHACO AND  INTRAOCULAR LENS PLACEMENT (IOC);  Surgeon: Tonny Branch, MD;  Location: [facility];  Service: Ophthalmology;  Laterality: Left;  CDE:37.77  . CATARACT EXTRACTION W/PHACO Right 07/29/2016   Procedure: CATARACT EXTRACTION PHACO AND INTRAOCULAR LENS PLACEMENT RIGHT EYE; CDE:  18.10;  Surgeon: Tonny Branch, MD;  Location: [facility];  Service: Ophthalmology;  Laterality: Right;  . IR CT HEAD LTD  08/28/2018  . IR PERCUTANEOUS ART THROMBECTOMY/INFUSION INTRACRANIAL INC DIAG ANGIO  08/28/2018  . LOWER EXTREMITY ANGIOGRAPHY Bilateral 12/07/2018   Procedure: LOWER EXTREMITY ANGIOGRAPHY;  Surgeon: Waynetta Sandy, MD;  Location: [facility];  Service: Cardiovascular;  Laterality: Bilateral;  . PERIPHERAL VASCULAR INTERVENTION Left 12/07/2018   Procedure: PERIPHERAL VASCULAR INTERVENTION;  Surgeon: Waynetta Sandy, MD;  Location: [facility];  Service: Cardiovascular;  Laterality: Left;  . RADIOLOGY WITH ANESTHESIA N/A 08/28/2018   Procedure: IR WITH ANESTHESIA;  Surgeon: Radiologist, Medication, MD;  Location: [facility];  Service: Radiology;  Laterality: N/A;    Short Social History:  Social History   Tobacco Use  . Smoking status: Former Smoker    Packs/day: 1.00    Years: 42.00    Pack years: 42.00    Types: Cigarettes    Start date: 07/07/1969  . Smokeless tobacco: Never Used  Substance Use Topics  . Alcohol use: Not Currently    Alcohol/week: 0.0 standard drinks    Comment: rarely  No Known Allergies  Current Outpatient Medications  Medication Sig Dispense Refill  . acetaminophen (TYLENOL) 160 MG/5ML solution Place 20.3 mLs (650 mg total) into feeding tube every 4 (four) hours as needed for mild pain (or temp > 37.5 C (99.5 F)). (Patient not taking: Reported on 12/07/2018) 120 mL 0  . acetaminophen (TYLENOL) 325 MG tablet Take 650 mg by mouth every 6 (six) hours as needed for moderate pain or fever.    . Amino Acids-Protein Hydrolys (FEEDING SUPPLEMENT, PRO-STAT SUGAR  FREE 64,) LIQD Place 30 mLs into feeding tube daily. (Patient not taking: Reported on 12/07/2018) 900 mL 0  . aspirin 81 MG chewable tablet Place 1 tablet (81 mg total) into feeding tube daily.    Marland Kitchen atorvastatin (LIPITOR) 40 MG tablet Take 40 mg by mouth every evening.    . bisacodyl (DULCOLAX) 10 MG suppository Place 1 suppository (10 mg total) rectally daily as needed for severe constipation. (Patient not taking: Reported on 12/07/2018) 12 suppository 0  . carvedilol (COREG) 6.25 MG tablet Place 1 tablet (6.25 mg total) into feeding tube 2 (two) times daily with a meal.    . chlorhexidine gluconate, MEDLINE KIT, (PERIDEX) 0.12 % solution 15 mLs by Mouth Rinse route 2 (two) times daily. (Patient not taking: Reported on 12/07/2018) 120 mL 0  . docusate (COLACE) 50 MG/5ML liquid Take 10 mLs (100 mg total) by mouth daily. (Patient not taking: Reported on 12/07/2018) 100 mL 0  . guaiFENesin (MUCINEX) 600 MG 12 hr tablet Take 600 mg by mouth every 6 (six) hours as needed for cough or to loosen phlegm.    Marland Kitchen guaiFENesin (ROBITUSSIN) 100 MG/5ML SOLN Take 15 mLs (300 mg total) by mouth every 6 (six) hours as needed for cough or to loosen phlegm. (Patient not taking: Reported on 12/07/2018) 1200 mL 0  . insulin aspart (NOVOLOG) 100 UNIT/ML injection Inject 0-20 Units into the skin every 4 (four) hours. (Patient not taking: Reported on 12/07/2018) 10 mL 11  . insulin glargine (LANTUS) 100 UNIT/ML injection Inject 0.2 mLs (20 Units total) into the skin every morning. (Patient not taking: Reported on 12/07/2018) 10 mL 11  . insulin lispro (HUMALOG) 100 UNIT/ML injection Inject 5 Units into the skin 3 (three) times daily before meals.    Marland Kitchen ipratropium-albuterol (DUONEB) 0.5-2.5 (3) MG/3ML SOLN Take 3 mLs by nebulization every 6 (six) hours as needed. 360 mL   . metFORMIN (GLUCOPHAGE) 1000 MG tablet Take 1,000 mg by mouth 2 (two) times daily with a meal.     . Nutritional Supplements (FEEDING SUPPLEMENT, VITAL AF 1.2 CAL,)  LIQD Place 1,000 mLs into feeding tube continuous. (Patient not taking: Reported on 12/07/2018)    . Omega-3 Fatty Acids (FISH OIL) 1000 MG CAPS Take 3,000 mg by mouth 2 (two) times daily.    . ondansetron (ZOFRAN) 4 MG/2ML SOLN injection Inject 2 mLs (4 mg total) into the vein every 6 (six) hours as needed for nausea or vomiting. (Patient not taking: Reported on 12/07/2018) 2 mL 0  . oxyCODONE (OXY IR/ROXICODONE) 5 MG immediate release tablet Take 1 tablet (5 mg total) by mouth every 6 (six) hours as needed for moderate pain. 20 tablet 0  . pantoprazole sodium (PROTONIX) 40 mg/20 mL PACK Place 20 mLs (40 mg total) into feeding tube daily. 30 each   . polyethylene glycol (MIRALAX / GLYCOLAX) packet Take 17 g by mouth daily as needed for mild constipation. (Patient taking differently: Take 17 g by mouth 2 (two)  times daily as needed for mild constipation. ) 14 each 0  . predniSONE (DELTASONE) 10 MG tablet Take 10 mg by mouth daily with breakfast.     No current facility-administered medications for this visit.     Review of Systems  Constitutional:  Constitutional negative. HENT: HENT negative.  Eyes: Eyes negative.  Respiratory: Respiratory negative.  Cardiovascular: Cardiovascular negative.  Musculoskeletal: Musculoskeletal negative.  Skin: Positive for wound.  Neurological: Positive for focal weakness.  Hematologic: Hematologic/lymphatic negative.  Psychiatric: Psychiatric negative.        Objective:  Objective   There were no vitals filed for this visit. There is no height or weight on file to calculate BMI.  Physical Exam Eyes:     Pupils: Pupils are equal, round, and reactive to light.  Cardiovascular:     Rate and Rhythm: Normal rate.     Pulses:          Femoral pulses are 2+ on the right side and 2+ on the left side.      Popliteal pulses are 0 on the left side.  Pulmonary:     Effort: Pulmonary effort is normal.  Abdominal:     General: Abdomen is flat.      Palpations: Abdomen is soft.  Musculoskeletal:     Comments: Well-healed right above-knee amputation site staples removed today  Skin:    Comments: Dry gangrene left heel and left second toe  Neurological:     Mental Status: He is alert.     Data: I reviewed his previous angiogram which demonstrates occluded left SFA that could not be revascularized at the time.     Assessment/Plan:     68 year old male status post right above-knee amputation which is healing well.  He remains in rehab.  Attempted revascularization of left SFA in the past thought the possible he would need bypass.  Given his current state I do not think bypass would be his best option we will attempt endovascular revascularization.  He remains at high risk for left lower extremity above-knee amputation and I discussed this with him today.     Waynetta Sandy MD [facility]

## 2019-01-21 ENCOUNTER — Other Ambulatory Visit: Payer: Self-pay

## 2019-01-21 ENCOUNTER — Encounter (HOSPITAL_COMMUNITY)
Admission: RE | Disposition: A | Discharge status: Skilled Nursing Facility | Payer: Medicare Other | Source: Home / Self Care | Attending: Vascular Surgery

## 2019-01-21 ENCOUNTER — Observation Stay (HOSPITAL_COMMUNITY)
Admission: RE | Admit: 2019-01-21 | Discharge: 2019-01-22 | Disposition: A | Discharge status: Skilled Nursing Facility | Payer: Medicare Other | Attending: Vascular Surgery | Admitting: Vascular Surgery

## 2019-01-21 DIAGNOSIS — L97429 Non-pressure chronic ulcer of left heel and midfoot with unspecified severity: Secondary | ICD-10-CM | POA: Insufficient documentation

## 2019-01-21 DIAGNOSIS — N179 Acute kidney failure, unspecified: Secondary | ICD-10-CM | POA: Insufficient documentation

## 2019-01-21 DIAGNOSIS — Z8249 Family history of ischemic heart disease and other diseases of the circulatory system: Secondary | ICD-10-CM | POA: Diagnosis not present

## 2019-01-21 DIAGNOSIS — E1151 Type 2 diabetes mellitus with diabetic peripheral angiopathy without gangrene: Principal | ICD-10-CM | POA: Insufficient documentation

## 2019-01-21 DIAGNOSIS — Z955 Presence of coronary angioplasty implant and graft: Secondary | ICD-10-CM | POA: Insufficient documentation

## 2019-01-21 DIAGNOSIS — Z7982 Long term (current) use of aspirin: Secondary | ICD-10-CM | POA: Diagnosis not present

## 2019-01-21 DIAGNOSIS — I70245 Atherosclerosis of native arteries of left leg with ulceration of other part of foot: Secondary | ICD-10-CM | POA: Insufficient documentation

## 2019-01-21 DIAGNOSIS — I70244 Atherosclerosis of native arteries of left leg with ulceration of heel and midfoot: Secondary | ICD-10-CM | POA: Diagnosis not present

## 2019-01-21 DIAGNOSIS — J449 Chronic obstructive pulmonary disease, unspecified: Secondary | ICD-10-CM | POA: Diagnosis not present

## 2019-01-21 DIAGNOSIS — I739 Peripheral vascular disease, unspecified: Secondary | ICD-10-CM | POA: Diagnosis present

## 2019-01-21 DIAGNOSIS — Z794 Long term (current) use of insulin: Secondary | ICD-10-CM | POA: Diagnosis not present

## 2019-01-21 DIAGNOSIS — Z87891 Personal history of nicotine dependence: Secondary | ICD-10-CM | POA: Diagnosis not present

## 2019-01-21 DIAGNOSIS — Z79899 Other long term (current) drug therapy: Secondary | ICD-10-CM | POA: Insufficient documentation

## 2019-01-21 DIAGNOSIS — I998 Other disorder of circulatory system: Secondary | ICD-10-CM | POA: Diagnosis not present

## 2019-01-21 DIAGNOSIS — E11621 Type 2 diabetes mellitus with foot ulcer: Secondary | ICD-10-CM | POA: Diagnosis not present

## 2019-01-21 DIAGNOSIS — I10 Essential (primary) hypertension: Secondary | ICD-10-CM | POA: Insufficient documentation

## 2019-01-21 DIAGNOSIS — I2721 Secondary pulmonary arterial hypertension: Secondary | ICD-10-CM | POA: Diagnosis not present

## 2019-01-21 DIAGNOSIS — K219 Gastro-esophageal reflux disease without esophagitis: Secondary | ICD-10-CM | POA: Insufficient documentation

## 2019-01-21 DIAGNOSIS — E785 Hyperlipidemia, unspecified: Secondary | ICD-10-CM | POA: Insufficient documentation

## 2019-01-21 DIAGNOSIS — Z89611 Acquired absence of right leg above knee: Secondary | ICD-10-CM | POA: Insufficient documentation

## 2019-01-21 DIAGNOSIS — I2510 Atherosclerotic heart disease of native coronary artery without angina pectoris: Secondary | ICD-10-CM | POA: Insufficient documentation

## 2019-01-21 DIAGNOSIS — L97529 Non-pressure chronic ulcer of other part of left foot with unspecified severity: Secondary | ICD-10-CM | POA: Diagnosis not present

## 2019-01-21 DIAGNOSIS — I252 Old myocardial infarction: Secondary | ICD-10-CM | POA: Diagnosis not present

## 2019-01-21 HISTORY — PX: PERIPHERAL VASCULAR INTERVENTION: CATH118257

## 2019-01-21 HISTORY — PX: ABDOMINAL AORTOGRAM W/LOWER EXTREMITY: CATH118223

## 2019-01-21 LAB — CBC
HCT: 31.1 % — ABNORMAL LOW (ref 39.0–52.0)
Hemoglobin: 10.6 g/dL — ABNORMAL LOW (ref 13.0–17.0)
MCH: 31.5 pg (ref 26.0–34.0)
MCHC: 34.1 g/dL (ref 30.0–36.0)
MCV: 92.6 fL (ref 80.0–100.0)
Platelets: 219 10*3/uL (ref 150–400)
RBC: 3.36 MIL/uL — ABNORMAL LOW (ref 4.22–5.81)
RDW: 12 % (ref 11.5–15.5)
WBC: 7 10*3/uL (ref 4.0–10.5)
nRBC: 0 % (ref 0.0–0.2)

## 2019-01-21 LAB — POCT I-STAT 4, (NA,K, GLUC, HGB,HCT)
Glucose, Bld: 176 mg/dL — ABNORMAL HIGH (ref 70–99)
HCT: 35 % — ABNORMAL LOW (ref 39.0–52.0)
Hemoglobin: 11.9 g/dL — ABNORMAL LOW (ref 13.0–17.0)
Potassium: 3.8 mmol/L (ref 3.5–5.1)
Sodium: 140 mmol/L (ref 135–145)

## 2019-01-21 LAB — GLUCOSE, CAPILLARY
Glucose-Capillary: 200 mg/dL — ABNORMAL HIGH (ref 70–99)
Glucose-Capillary: 138 mg/dL — ABNORMAL HIGH (ref 70–99)
Glucose-Capillary: 157 mg/dL — ABNORMAL HIGH (ref 70–99)
Glucose-Capillary: 179 mg/dL — ABNORMAL HIGH (ref 70–99)

## 2019-01-21 LAB — CREATININE, SERUM
Creatinine, Ser: 0.5 mg/dL — ABNORMAL LOW (ref 0.61–1.24)
GFR calc Af Amer: >60 mL/min (ref >60)
GFR calc non Af Amer: >60 mL/min (ref >60)

## 2019-01-21 LAB — POCT ACTIVATED CLOTTING TIME
Activated Clotting Time: 362 seconds
Activated Clotting Time: 235 seconds

## 2019-01-21 LAB — POCT I-STAT CREATININE: Creatinine, Ser: 0.4 mg/dL — ABNORMAL LOW (ref 0.61–1.24)

## 2019-01-21 SURGERY — ABDOMINAL AORTOGRAM W/LOWER EXTREMITY
Anesthesia: LOCAL | Laterality: Left

## 2019-01-21 MED ORDER — HYDRALAZINE HCL 20 MG/ML IJ SOLN: 5.0000 mg | INTRAMUSCULAR | Status: DC | PRN

## 2019-01-21 MED ORDER — ACETAMINOPHEN 325 MG PO TABS: 650.0000 mg | ORAL_TABLET | Freq: Four times a day (QID) | ORAL | Status: DC | PRN

## 2019-01-21 MED ORDER — LIDOCAINE HCL (PF) 1 % IJ SOLN
INTRAMUSCULAR | Status: AC
  Filled 2019-01-21: qty 30

## 2019-01-21 MED ORDER — ACETAMINOPHEN 325 MG PO TABS: 650.0000 mg | ORAL_TABLET | ORAL | Status: DC | PRN

## 2019-01-21 MED ORDER — HEPARIN (PORCINE) IN NACL 1000-0.9 UT/500ML-% IV SOLN
INTRAVENOUS | Status: AC
  Filled 2019-01-21: qty 1000

## 2019-01-21 MED ORDER — SODIUM CHLORIDE 0.9% FLUSH
3.0000 mL | Freq: Two times a day (BID) | INTRAVENOUS | Status: DC
  Administered 2019-01-22: 3 mL via INTRAVENOUS

## 2019-01-21 MED ORDER — CLOPIDOGREL BISULFATE 75 MG PO TABS: 75.0000 mg | ORAL_TABLET | Freq: Every day | ORAL | Status: DC

## 2019-01-21 MED ORDER — LIDOCAINE HCL (PF) 1 % IJ SOLN
INTRAMUSCULAR | Status: DC | PRN
  Administered 2019-01-21: 15 mL via INTRADERMAL
  Administered 2019-01-21: 5 mL via INTRADERMAL
  Administered 2019-01-21: 2 mL via INTRADERMAL

## 2019-01-21 MED ORDER — MORPHINE SULFATE (PF) 10 MG/ML IV SOLN: 2.0000 mg | INTRAVENOUS | Status: DC | PRN

## 2019-01-21 MED ORDER — CARVEDILOL 6.25 MG PO TABS
6.2500 mg | ORAL_TABLET | Freq: Two times a day (BID) | ORAL | Status: DC
  Administered 2019-01-21 – 2019-01-22 (×2): 6.25 mg
  Filled 2019-01-21 (×2): qty 1

## 2019-01-21 MED ORDER — SODIUM CHLORIDE 0.9% FLUSH: 3.0000 mL | INTRAVENOUS | Status: DC | PRN

## 2019-01-21 MED ORDER — LABETALOL HCL 5 MG/ML IV SOLN: 10.0000 mg | INTRAVENOUS | Status: DC | PRN

## 2019-01-21 MED ORDER — HEPARIN SODIUM (PORCINE) 5000 UNIT/ML IJ SOLN
5000.0000 [IU] | Freq: Three times a day (TID) | INTRAMUSCULAR | Status: DC
  Administered 2019-01-21 – 2019-01-22 (×2): 5000 [IU] via SUBCUTANEOUS
  Filled 2019-01-21 (×2): qty 1

## 2019-01-21 MED ORDER — ASPIRIN EC 81 MG PO TBEC
81.0000 mg | DELAYED_RELEASE_TABLET | Freq: Every day | ORAL | Status: DC
  Administered 2019-01-22: 81 mg via ORAL
  Filled 2019-01-21: qty 1

## 2019-01-21 MED ORDER — ONDANSETRON HCL 4 MG PO TABS: 4.0000 mg | ORAL_TABLET | Freq: Four times a day (QID) | ORAL | Status: DC | PRN

## 2019-01-21 MED ORDER — HEPARIN SODIUM (PORCINE) 1000 UNIT/ML IJ SOLN
INTRAMUSCULAR | Status: DC | PRN
  Administered 2019-01-21: 10000 [IU] via INTRAVENOUS

## 2019-01-21 MED ORDER — FENTANYL CITRATE (PF) 100 MCG/2ML IJ SOLN
INTRAMUSCULAR | Status: DC | PRN
  Administered 2019-01-21 (×2): 25 ug via INTRAVENOUS

## 2019-01-21 MED ORDER — DOCUSATE SODIUM 50 MG/5ML PO LIQD: 50.0000 mg | Freq: Every day | ORAL | Status: DC

## 2019-01-21 MED ORDER — CLOPIDOGREL BISULFATE 75 MG PO TABS: 75.0000 mg | ORAL_TABLET | Freq: Every day | ORAL | 3 refills | Status: DC

## 2019-01-21 MED ORDER — HEPARIN (PORCINE) IN NACL 1000-0.9 UT/500ML-% IV SOLN
INTRAVENOUS | Status: DC | PRN
  Administered 2019-01-21 (×2): 500 mL

## 2019-01-21 MED ORDER — SODIUM CHLORIDE 0.9 % IV SOLN: 250.0000 mL | INTRAVENOUS | Status: DC | PRN

## 2019-01-21 MED ORDER — CLOPIDOGREL BISULFATE 300 MG PO TABS
ORAL_TABLET | ORAL | Status: DC | PRN
  Administered 2019-01-21: 300 mg via ORAL

## 2019-01-21 MED ORDER — SODIUM CHLORIDE 0.9 % WEIGHT BASED INFUSION
1.0000 mL/kg/h | INTRAVENOUS | Status: AC
  Administered 2019-01-21: 17:00:00 1 mL/kg/h via INTRAVENOUS

## 2019-01-21 MED ORDER — PANTOPRAZOLE SODIUM 40 MG PO TBEC
40.0000 mg | DELAYED_RELEASE_TABLET | Freq: Every day | ORAL | Status: DC
  Administered 2019-01-22: 40 mg via ORAL
  Filled 2019-01-21: qty 1

## 2019-01-21 MED ORDER — SODIUM CHLORIDE 0.9 % WEIGHT BASED INFUSION: 1.0000 mL/kg/h | INTRAVENOUS | Status: AC

## 2019-01-21 MED ORDER — ONDANSETRON HCL 4 MG/2ML IJ SOLN: 4.0000 mg | Freq: Four times a day (QID) | INTRAMUSCULAR | Status: DC | PRN

## 2019-01-21 MED ORDER — MIDAZOLAM HCL 2 MG/2ML IJ SOLN
INTRAMUSCULAR | Status: DC | PRN
  Administered 2019-01-21: 1 mg via INTRAVENOUS

## 2019-01-21 MED ORDER — CLOPIDOGREL BISULFATE 300 MG PO TABS
ORAL_TABLET | ORAL | Status: AC
  Filled 2019-01-21: qty 1

## 2019-01-21 MED ORDER — INSULIN ASPART 100 UNIT/ML ~~LOC~~ SOLN
0.0000 [IU] | Freq: Three times a day (TID) | SUBCUTANEOUS | Status: DC
  Administered 2019-01-22 (×2): 1 [IU] via SUBCUTANEOUS

## 2019-01-21 MED ORDER — SODIUM CHLORIDE 0.9% FLUSH
3.0000 mL | Freq: Two times a day (BID) | INTRAVENOUS | Status: DC
  Administered 2019-01-22 (×2): 3 mL via INTRAVENOUS

## 2019-01-21 MED ORDER — OXYCODONE HCL 5 MG PO TABS: 5.0000 mg | ORAL_TABLET | ORAL | Status: DC | PRN

## 2019-01-21 MED ORDER — ATORVASTATIN CALCIUM 40 MG PO TABS
40.0000 mg | ORAL_TABLET | Freq: Every day | ORAL | Status: DC
  Administered 2019-01-21: 40 mg via ORAL
  Filled 2019-01-21: qty 1

## 2019-01-21 MED ORDER — MIDAZOLAM HCL 2 MG/2ML IJ SOLN
INTRAMUSCULAR | Status: AC
  Filled 2019-01-21: qty 2

## 2019-01-21 MED ORDER — CLOPIDOGREL BISULFATE 75 MG PO TABS
75.0000 mg | ORAL_TABLET | Freq: Every day | ORAL | Status: DC
  Administered 2019-01-22: 75 mg via ORAL
  Filled 2019-01-21: qty 1

## 2019-01-21 MED ORDER — SODIUM CHLORIDE 0.9 % IV SOLN
INTRAVENOUS | Status: DC
  Administered 2019-01-21: 100 mL/h via INTRAVENOUS

## 2019-01-21 MED ORDER — IODIXANOL 320 MG/ML IV SOLN
INTRAVENOUS | Status: DC | PRN
  Administered 2019-01-21: 120 mL via INTRA_ARTERIAL

## 2019-01-21 MED ORDER — CLOPIDOGREL BISULFATE 75 MG PO TABS: 300.0000 mg | ORAL_TABLET | Freq: Once | ORAL | Status: DC

## 2019-01-21 MED ORDER — BISACODYL 10 MG RE SUPP: 10.0000 mg | Freq: Every day | RECTAL | Status: DC | PRN

## 2019-01-21 MED ORDER — OXYCODONE HCL 5 MG PO TABS: 5.0000 mg | ORAL_TABLET | Freq: Four times a day (QID) | ORAL | Status: DC | PRN

## 2019-01-21 MED ORDER — GUAIFENESIN ER 600 MG PO TB12: 600.0000 mg | ORAL_TABLET | Freq: Four times a day (QID) | ORAL | Status: DC | PRN

## 2019-01-21 MED ORDER — PREDNISONE 10 MG PO TABS
10.0000 mg | ORAL_TABLET | Freq: Every day | ORAL | Status: DC
  Administered 2019-01-22: 10 mg via ORAL
  Filled 2019-01-21: qty 1

## 2019-01-21 MED ORDER — CLOPIDOGREL BISULFATE 75 MG PO TABS: 300.0000 mg | ORAL_TABLET | Freq: Once | ORAL | Status: AC

## 2019-01-21 MED ORDER — MORPHINE SULFATE (PF) 2 MG/ML IV SOLN: 2.0000 mg | INTRAVENOUS | Status: DC | PRN

## 2019-01-21 MED ORDER — FENTANYL CITRATE (PF) 100 MCG/2ML IJ SOLN
INTRAMUSCULAR | Status: AC
  Filled 2019-01-21: qty 2

## 2019-01-21 MED ORDER — PROMOD PO LIQD: 60.0000 mL | Freq: Every day | ORAL | Status: DC

## 2019-01-21 SURGICAL SUPPLY — 25 items
BALLN COYOTE OTW 3X220X150 (BALLOONS) ×2
BALLN STERLING OTW 5X220X150 (BALLOONS) ×2
BALLOON COYOTE OTW 3X220X150 (BALLOONS) IMPLANT
BALLOON STERLING OTW 5X220X150 (BALLOONS) IMPLANT
CATH MUSTANG 3X40X135 (BALLOONS) ×1 IMPLANT
CATH OMNI FLUSH 5F 65CM (CATHETERS) ×1 IMPLANT
CATH OUTBACK ELITE RE-ENTRY (CATHETERS) ×1 IMPLANT
CATH QUICKCROSS SUPP .035X90CM (MICROCATHETER) ×2 IMPLANT
CLOSURE MYNX CONTROL 6F/7F (Vascular Products) ×1 IMPLANT
DEVICE ONE SNARE 10MM (MISCELLANEOUS) ×1 IMPLANT
DEVICE TORQUE .025-.038 (MISCELLANEOUS) ×1 IMPLANT
GLIDEWIRE ADV .035X260CM (WIRE) ×2 IMPLANT
KIT ENCORE 26 ADVANTAGE (KITS) ×1 IMPLANT
KIT MICROPUNCTURE NIT STIFF (SHEATH) ×1 IMPLANT
KIT PV (KITS) ×2 IMPLANT
PATCH THROMBIX TOPICAL PLAIN (HEMOSTASIS) ×1 IMPLANT
SHEATH HIGHFLEX ANSEL 6FRX55 (SHEATH) ×1 IMPLANT
SHEATH MICROPUNCTURE PEDAL 4FR (SHEATH) ×1 IMPLANT
SHEATH PINNACLE 5F 10CM (SHEATH) ×1 IMPLANT
SHEATH PINNACLE 6F 10CM (SHEATH) ×1 IMPLANT
SHEATH PROBE COVER 6X72 (BAG) ×1 IMPLANT
STENT VIABAHN 5X250X120 (Permanent Stent) ×2 IMPLANT
TRANSDUCER W/STOPCOCK (MISCELLANEOUS) ×2 IMPLANT
TRAY PV CATH (CUSTOM PROCEDURE TRAY) ×2 IMPLANT
WIRE SPARTACORE .014X300CM (WIRE) ×3 IMPLANT

## 2019-01-21 NOTE — H&P (Addendum)
   History and Physical Update  The patient was interviewed and re-examined.  The patient's previous History and Physical has been reviewed and is unchanged from recent office visit. Plan for left sfa endovascular revascularization.   Benson Porcaro C. Donzetta Matters, MD [facility] Office: [phone] Pager: [phone]   01/21/2019, 12:05 PM      Servando Snare, MD

## 2019-01-21 NOTE — Op Note (Signed)
° ° °  Patient name: Clayton [name] MRN: [id number] DOB: 1951-01-27 Sex: male  01/21/2019 Pre-operative Diagnosis: Critical left lower extremity ischemia with wounds Post-operative diagnosis:  Same Surgeon:  Erlene Quan C. Donzetta Matters, MD Procedure Performed: 1.  Ultrasound-guided cannulation right common femoral artery 2.  Ultrasound-guided cannulation left posterior tibial artery 3.  Left lower extremity angiogram 4.  Stent of left SFA with two 5 x 280mm viabahn 5.  Minx device closure right common femoral artery 6.  Moderate sedation with fentanyl and Versed for 100 minutes  Indications: 68 year old male with history of right lower extremity amputation.  Left lower extremity is wounds.  We have previously attempted to revascularize left SFA plan for bypass but given his debilitated state thought that he would be best with reattempt at endovascular revascularization.  Findings: SFA was again occluded and reconstituted above-knee popliteal artery.  Could not get antegrade through the artery.  We were able to get retrograde from the posterior tibial meet the 2 wires and snare.  We then performed balloon angioplasty and stenting of the entire SFA.  He then had runoff via the anterior tibial artery and posterior tibial artery at completion.   Procedure:  The patient was identified in the holding area and taken to room 8.  The patient was then placed supine on the table and prepped and draped in the usual sterile fashion.  A time out was called.  Ultrasound was used to evaluate the right common femoral artery which is known to be free of disease.  There is anesthetized 1% lidocaine cannulated micropuncture needle followed by wire and sheath.  An image was saved the permanent record.  We placed a Glidewire advantage followed by 5 [place] sheath.  We then crossed the bifurcation perform left lower extremity angiography.  We then placed a long 6 French sheath patient was fully heparinized.  I attempted to use Glidewire  and quick cross catheter could not cross from above similar to before.  We then used ultrasound to find the posterior tibial artery on the left.  There is anesthetized 1% lidocaine again cannulated with direct ultrasound visualization with micropuncture needle followed by wire micropuncture sheath.  Again an image was saved the permanent record.  We then placed a Glidewire advantage.  We removed our sheath in place the quick cross catheter.  We tried from above and below multiple times to cross.  We then placed a 3 mm balloon from below and used an Outback catheter to puncture the balloon and placed an 014 into the balloon.  We are then able to pull the balloon down into the native popliteal artery.  This was then snared from below.  We serially dilated the SFA with 3 and 5 mm balloons.  Viabahn stents were then deployed serially and postdilated with a 5 mm balloon.  Completion demonstrated no residual stenosis or dissection.  Satisfied we removed our sheath exchanged for a short 6 French sheath and deployed a minx device in the right common femoral artery.  The left PT sheath was removed and pressure held.  He tolerated the procedure without immediate complication.  Contrast: 120 cc   Linzie Boursiquot C. Donzetta Matters, MD [facility] Office: [phone] Pager: [phone]

## 2019-01-21 NOTE — Progress Notes (Signed)
Pt arrived to 4e from cath lab. Pt oriented to room and staff. Vitals obtained. Telemetry box applied and CCMD notified. Pt's right groin level 0. Left pedal and PT pulses dopplered. Pt's dinner tray ordered. Will continue current plan of care.

## 2019-01-22 ENCOUNTER — Encounter (HOSPITAL_COMMUNITY): Payer: Self-pay | Admitting: Vascular Surgery

## 2019-01-22 ENCOUNTER — Telehealth: Payer: Self-pay | Admitting: Vascular Surgery

## 2019-01-22 DIAGNOSIS — E1151 Type 2 diabetes mellitus with diabetic peripheral angiopathy without gangrene: Secondary | ICD-10-CM | POA: Diagnosis not present

## 2019-01-22 LAB — GLUCOSE, CAPILLARY
Glucose-Capillary: 135 mg/dL — ABNORMAL HIGH (ref 70–99)
Glucose-Capillary: 143 mg/dL — ABNORMAL HIGH (ref 70–99)

## 2019-01-22 MED ORDER — METFORMIN HCL 1000 MG PO TABS: 1000.0000 mg | ORAL_TABLET | Freq: Two times a day (BID) | ORAL | Status: AC

## 2019-01-22 NOTE — Discharge Summary (Addendum)
Discharge Summary    Clayton [name] [birth year] 68 y.o. male  [id number]  Admission Date: 01/21/2019  Discharge Date: 01/22/2019  Physician: Thomes Lolling*  Admission Diagnosis: PVD ulcer   HPI:   This is a 68 y.o. male status post right above-knee amputation which is healing well.  He remains in rehab.  Attempted revascularization of left SFA in the past thought the possible he would need bypass.  Given his current state I do not think bypass would be his best option we will attempt endovascular revascularization.  He remains at high risk for left lower extremity above-knee amputation and I discussed this with him today.  Hospital Course:  The patient was admitted to the hospital and taken to the operating room on 01/21/2019 and underwent: 1.  Ultrasound-guided cannulation right common femoral artery 2.  Ultrasound-guided cannulation left posterior tibial artery 3.  Left lower extremity angiogram 4.  Stent of left SFA with two 5 x 210m viabahn 5.  Minx device closure right common femoral artery 6.  Moderate sedation with fentanyl and Versed for 100 minutes    Findings: SFA was again occluded and reconstituted above-knee popliteal artery.  Could not get antegrade through the artery.  We were able to get retrograde from the posterior tibial meet the 2 wires and snare.  We then performed balloon angioplasty and stenting of the entire SFA.  He then had runoff via the anterior tibial artery and posterior tibial artery at completion.  The pt tolerated the procedure well and was transported to the PACU in good condition.   By POD 1, he was doing well.  He had brisk left PT and peroneal doppler signals with monophasic AT.  He was started on Plavix.  Creatinine on 5/7 was 0.4.  The remainder of the hospital course consisted of increasing mobilization and increasing intake of solids without difficulty.  CBC    Component Value Date/Time   WBC 7.0 01/21/2019 1805   RBC 3.36  (L) 01/21/2019 1805   HGB 10.6 (L) 01/21/2019 1805   HCT 31.1 (L) 01/21/2019 1805   PLT 219 01/21/2019 1805   MCV 92.6 01/21/2019 1805   MCH 31.5 01/21/2019 1805   MCHC 34.1 01/21/2019 1805   RDW 12.0 01/21/2019 1805   LYMPHSABS 0.4 (L) 08/28/2018 1002   MONOABS 0.4 08/28/2018 1002   EOSABS 0.0 08/28/2018 1002   BASOSABS 0.0 08/28/2018 1002    BMET    Component Value Date/Time   NA 140 01/21/2019 1046   K 3.8 01/21/2019 1046   CL 101 12/10/2018 0222   CO2 28 12/10/2018 0222   GLUCOSE 176 (H) 01/21/2019 1046   BUN 8 12/10/2018 0222   CREATININE 0.50 (L) 01/21/2019 1805   CALCIUM 8.3 (L) 12/10/2018 0222   GFRNONAA >60 01/21/2019 1805   GFRAA >60 01/21/2019 1805        Discharge Diagnosis:  PVD ulcer  Secondary Diagnosis: Patient Active Problem List   Diagnosis Date Noted  . PAD (peripheral artery disease) ([place] 12/07/2018  . Acute on chronic respiratory failure with hypoxia ([place]   . COPD, severe ([place]   . Pulmonary arterial hypertension ([place]   . Non-STEMI (non-ST elevated myocardial infarction) ([facility]   . Embolic stroke involving right middle cerebral artery ([place]   . Altered mental status   . Tracheostomy status ([place]   . Stroke (cerebrum) ([place] 08/28/2018  . Middle cerebral artery embolism, right 08/28/2018  . Elevated LFTs 08/24/2018  . AKI (acute kidney injury) ([facility] 08/24/2018  .  Acute gastroenteritis 08/24/2018  . HTN (hypertension) 08/24/2018  . Dehydration 08/24/2018  . Guaiac positive stools 07/03/2018  . Acute on chronic respiratory failure with hypoxia ([place]) 05/13/2017  . Elevated troponin 05/13/2017  . Respiratory failure ([place]) 06/28/2015  . COPD exacerbation ([place]) 06/28/2015  . DM type 2 (diabetes mellitus, type 2) ([place]) 06/28/2015  . Erectile dysfunction associated with type 2 diabetes mellitus ([place]) 07/12/2014  . CAD S/P percutaneous coronary angioplasty 04/11/2014  . Prostate cancer ([place]) 04/27/2012  . Abnormal ECG 01/06/2012  . TOBACCO  ABUSE 11/23/2010  . GERD 12/12/2009  . DIVERTICULOSIS, COLON 12/12/2009  . COLONIC POLYPS, BENIGN, HX OF 12/12/2009  . ANEMIA 12/04/2009  . HYPERLIPIDEMIA 09/15/2008  . MYOCARDIAL INFARCTION, HX OF 09/15/2008  . PERIPHERAL VASCULAR DISEASE 09/15/2008  . UNSPECIFIED IRON DEFICIENCY ANEMIA 02/15/2008  . BLOOD IN STOOL, OCCULT 02/15/2008   Past Medical History:  Diagnosis Date  . Acute on chronic respiratory failure with hypoxia ([place])   . Altered mental status   . CAD (coronary artery disease)    a. s/p BMS x 3 to RCA in 2009  . COPD (chronic obstructive pulmonary disease) ([place])   . COPD, severe ([place])   . Diabetes mellitus   . Embolic stroke involving right middle cerebral artery ([place])   . GERD (gastroesophageal reflux disease)   . Guaiac positive stools 07/03/2018  . HTN (hypertension)   . Hyperlipidemia   . Myocardial infarction ([place]) 2009  . Non-STEMI (non-ST elevated myocardial infarction) ([place])   . Prostate cancer ([place])   . Pulmonary arterial hypertension ([place])   . PVD (peripheral vascular disease) ([place])      Allergies as of 01/22/2019   No Known Allergies     Medication List    TAKE these medications   acetaminophen 325 MG tablet Commonly known as:  TYLENOL Take 650 mg by mouth every 6 (six) hours as needed for moderate pain or fever.   aspirin EC 81 MG tablet Take 81 mg by mouth daily.   atorvastatin 40 MG tablet Commonly known as:  LIPITOR Take 40 mg by mouth at bedtime.   bisacodyl 10 MG suppository Commonly known as:  DULCOLAX Place 1 suppository (10 mg total) rectally daily as needed for severe constipation.   carvedilol 6.25 MG tablet Commonly known as:  COREG Place 1 tablet (6.25 mg total) into feeding tube 2 (two) times daily with a meal.   chlorhexidine gluconate (MEDLINE KIT) 0.12 % solution Commonly known as:  PERIDEX 15 mLs by Mouth Rinse route 2 (two) times daily. What changed:  how much to take   clopidogrel 75 MG tablet Commonly known  as:  Plavix Take 1 tablet (75 mg total) by mouth daily.   Docusate Sodium 50 MG/15ML Liqd Take 50 mg by mouth 1 day or 1 dose.   Fish Oil 1000 MG Caps Take 1,000-3,000 mg by mouth See admin instructions. Take 1000 mg in the morning and 3000 mg in the evening   guaiFENesin 600 MG 12 hr tablet Commonly known as:  MUCINEX Take 600 mg by mouth every 6 (six) hours as needed for cough or to loosen phlegm.   HumaLOG 100 UNIT/ML injection Generic drug:  insulin lispro Inject 5 Units into the skin 3 (three) times daily before meals.   ipratropium-albuterol 0.5-2.5 (3) MG/3ML Soln Commonly known as:  DUONEB Take 3 mLs by nebulization every 6 (six) hours as needed. What changed:  reasons to take this   metFORMIN 1000 MG tablet Commonly known as:  GLUCOPHAGE Take 1 tablet (1,000 mg total) by mouth 2 (two) times daily with a meal. Start taking on:  Jan 24, 2019 What changed:  These instructions start on Jan 24, 2019. If you are unsure what to do until then, ask your doctor or other care provider.   ondansetron 4 MG tablet Commonly known as:  ZOFRAN Take 4 mg by mouth every 6 (six) hours as needed for nausea or vomiting.   oxyCODONE 5 MG immediate release tablet Commonly known as:  Oxy IR/ROXICODONE Take 1 tablet (5 mg total) by mouth every 6 (six) hours as needed for moderate pain.   pantoprazole 40 MG tablet Commonly known as:  PROTONIX Take 40 mg by mouth daily.   predniSONE 10 MG tablet Commonly known as:  DELTASONE Take 10 mg by mouth daily with breakfast.   ProMod Liqd Take 60 mLs by mouth daily.       Prescriptions given: Plavix '75mg'$  #30 3 Refills  Instructions:   [facility]  Discharge Instructions  Lower Extremity Angiogram; Angioplasty/Stenting  Please refer to the following instructions for your post-procedure care. Your surgeon or physician assistant will discuss any changes with you.  Activity  Avoid lifting more than 8  pounds (1 gallons of milk) for 72 hours (3 days) after your procedure. You may walk as much as you can tolerate. It's OK to drive after 72 hours.  Bathing/Showering  You may shower the day after your procedure. If you have a bandage, you may remove it at 24- 48 hours. Clean your incision site with mild soap and water. Pat the area dry with a clean towel.  Diet  Resume your pre-procedure diet. There are no special food restrictions following this procedure. All patients with peripheral vascular disease should follow a low fat/low cholesterol diet. In order to heal from your surgery, it is CRITICAL to get adequate nutrition. Your body requires vitamins, minerals, and protein. Vegetables are the best source of vitamins and minerals. Vegetables also provide the perfect balance of protein. Processed food has little nutritional value, so try to avoid this.  Medications  Resume taking all of your medications unless your doctor tells you not to. If your incision is causing pain, you may take over-the-counter pain relievers such as acetaminophen (Tylenol)  RESTART METFORMIN SUNDAY MAY 10TH  Follow Up  Follow up will be arranged at the time of your procedure. You may have an office visit scheduled or may be scheduled for surgery. Ask your surgeon if you have any questions.  Please call us immediately for any of the following conditions: .Severe or worsening pain your legs or feet at rest or with walking. .Increased pain, redness, drainage at your groin puncture site. .Fever of 101 degrees or higher. .If you have any mild or slow bleeding from your puncture site: lie down, apply firm constant pressure over the area with a piece of gauze or a clean wash cloth for 30 minutes- no peeking!, call 911 right away if you are still bleeding after 30 minutes, or if the bleeding is heavy and unmanageable.  Reduce your risk factors of vascular disease:  . Stop smoking. If you would like help call [facility] at  1-800-QUIT-NOW 603-642-8438) or [place] at [phone]. . Manage your cholesterol . Maintain a desired weight . Control your diabetes . Keep your blood pressure down .  If you have any questions, please call the office at [phone]   Disposition: home  Patient's condition: is Good  Follow up: 1. Dr. Donzetta Matters in 2-3 weeks with ABI and LLE arterial duplex and wound check.   Leontine Locket, PA-C [facility] [phone] 01/22/2019  8:20 AM

## 2019-01-22 NOTE — Discharge Instructions (Signed)
° °  [facility]  Discharge Instructions  Lower Extremity Angiogram; Angioplasty/Stenting  Please refer to the following instructions for your post-procedure care. Your surgeon or physician assistant will discuss any changes with you.  Activity  Avoid lifting more than 8 pounds (1 gallons of milk) for 72 hours (3 days) after your procedure. You may walk as much as you can tolerate. It's OK to drive after 72 hours.  Bathing/Showering  You may shower the day after your procedure. If you have a bandage, you may remove it at 24- 48 hours. Clean your incision site with mild soap and water. Pat the area dry with a clean towel.  Diet  Resume your pre-procedure diet. There are no special food restrictions following this procedure. All patients with peripheral vascular disease should follow a low fat/low cholesterol diet. In order to heal from your surgery, it is CRITICAL to get adequate nutrition. Your body requires vitamins, minerals, and protein. Vegetables are the best source of vitamins and minerals. Vegetables also provide the perfect balance of protein. Processed food has little nutritional value, so try to avoid this.  Medications  Resume taking all of your medications unless your doctor tells you not to. If your incision is causing pain, you may take over-the-counter pain relievers such as acetaminophen (Tylenol)  RESTART METFORMIN SUNDAY MAY 10TH  Follow Up  Follow up will be arranged at the time of your procedure. You may have an office visit scheduled or may be scheduled for surgery. Ask your surgeon if you have any questions.  Please call us immediately for any of the following conditions: Severe or worsening pain your legs or feet at rest or with walking. Increased pain, redness, drainage at your groin puncture site. Fever of 101 degrees or higher. If you have any mild or slow bleeding from your puncture site: lie down, apply firm constant  pressure over the area with a piece of gauze or a clean wash cloth for 30 minutes- no peeking!, call 911 right away if you are still bleeding after 30 minutes, or if the bleeding is heavy and unmanageable.  Reduce your risk factors of vascular disease:   Stop smoking. If you would like help call [facility] at 1-800-QUIT-NOW 9800757289) or [place] at [phone].  Manage your cholesterol  Maintain a desired weight  Control your diabetes  Keep your blood pressure down   If you have any questions, please call the office at [phone]

## 2019-01-22 NOTE — Telephone Encounter (Signed)
sch appt lvm mld ltr 02/19/2019 9am ABI 10am LE art 1020am wound check MD

## 2019-01-22 NOTE — TOC Initial Note (Signed)
Transition of Care [facility]) - Initial/Assessment Note    Patient Details  Name: Clayton [name] MRN: [id number] Date of Birth: Apr 13, 1951  [facility]) CM/SW Contact:    Vinie Sill, [place] Phone Number: 01/22/2019, 10:26 AM  Clinical Narrative:                 CSW visit with the patinet at bedside. Patient was pleasantly confused. CSW confirmed patient is from [facility] SNF. CSW spoke with the patient's sister, Peggyann Juba and she confirmed the patient will return to [facility] once discharged.  Thurmond Butts, MSW, [facility] Clinical Social Worker [phone]   Expected Discharge Plan: Skilled Nursing Facility Barriers to Discharge: No Barriers Identified   Patient Goals and CMS Choice Patient states their goals for this hospitalization and ongoing recovery are:: patient is from SNF and family wants him to return      Expected Discharge Plan and Services Expected Discharge Plan: [place] arrangements for the past 2 months: [place] Expected Discharge Date: 01/22/19                                    Prior Living Arrangements/Services Living arrangements for the past 2 months: [place]   Patient language and need for interpreter reviewed:: No Do you feel safe going back to the place where you live?: Yes      Need for Family Participation in Patient Care: Yes (Comment) Care giver support system in place?: Yes (comment)   Criminal Activity/Legal Involvement Pertinent to Current Situation/Hospitalization: No - Comment as needed  Activities of Daily Living      Permission Sought/Granted Permission sought to share information with : Facility Art therapist granted to share information with : Yes, Verbal Permission Granted  Share Information with NAME: Carmelina Peal  Permission granted to share info w AGENCY: [place] granted to share info w  Relationship: sister   Permission granted to share info w Contact Information: [phone]  Emotional Assessment Appearance:: Appears stated age Attitude/Demeanor/Rapport: Engaged Affect (typically observed): (confused) Orientation: : Oriented to Self, Oriented to Place Alcohol / Substance Use: Not Applicable Psych Involvement: No (comment)  Admission diagnosis:  PVD ulcer Patient Active Problem List   Diagnosis Date Noted  . PAD (peripheral artery disease) ([place]) 12/07/2018  . Acute on chronic respiratory failure with hypoxia ([place])   . COPD, severe ([place])   . Pulmonary arterial hypertension ([place])   . Non-STEMI (non-ST elevated myocardial infarction) ([place])   . Embolic stroke involving right middle cerebral artery ([place])   . Altered mental status   . Tracheostomy status ([place])   . Stroke (cerebrum) ([place]) 08/28/2018  . Middle cerebral artery embolism, right 08/28/2018  . Elevated LFTs 08/24/2018  . AKI (acute kidney injury) ([place]) 08/24/2018  . Acute gastroenteritis 08/24/2018  . HTN (hypertension) 08/24/2018  . Dehydration 08/24/2018  . Guaiac positive stools 07/03/2018  . Acute on chronic respiratory failure with hypoxia ([place]) 05/13/2017  . Elevated troponin 05/13/2017  . Respiratory failure ([place]) 06/28/2015  . COPD exacerbation ([place]) 06/28/2015  . DM type 2 (diabetes mellitus, type 2) ([place]) 06/28/2015  . Erectile dysfunction associated with type 2 diabetes mellitus ([place]) 07/12/2014  . CAD S/P percutaneous coronary angioplasty 04/11/2014  . Prostate cancer ([place]) 04/27/2012  . Abnormal ECG 01/06/2012  . TOBACCO ABUSE 11/23/2010  . GERD 12/12/2009  . DIVERTICULOSIS,  COLON 12/12/2009  . COLONIC POLYPS, BENIGN, HX OF 12/12/2009  . ANEMIA 12/04/2009  . HYPERLIPIDEMIA 09/15/2008  . MYOCARDIAL INFARCTION, HX OF 09/15/2008  . PERIPHERAL VASCULAR DISEASE 09/15/2008  . UNSPECIFIED IRON DEFICIENCY ANEMIA 02/15/2008  . BLOOD IN STOOL, OCCULT 02/15/2008   PCP:  Dione Housekeeper,  MD Pharmacy:   [id number] [place], [place] [address] Phone: [phone] Fax: [phone]     Social Determinants of Health (SDOH) Interventions    Readmission Risk Interventions Readmission Risk Prevention Plan 12/11/2018  Transportation Screening Complete  Medication Review ([place]) Complete  PCP or Specialist appointment within 3-5 days of discharge Complete  HRI or Home Care Consult Complete  SW Recovery Care/Counseling Consult Complete  Palliative Care Screening Not [place] Complete  Some recent data might be hidden

## 2019-01-22 NOTE — Progress Notes (Addendum)
  Progress Note    01/22/2019 8:03 AM 1 Day Post-Op  Subjective:  No complaints; says his foot feels better  Tm 99 HR 80's-90's NSR 992'E-268'T systolic 41% RA  Vitals:   01/21/19 1959 01/22/19 0436  BP: 121/81 (!) 159/77  Pulse: 99 85  Resp: 20 (!) 22  Temp: 98.6 F (37 C) 99 F (37.2 C)  SpO2: 99% 97%    Physical Exam: Cardiac:  regular Lungs:  Non labored Incisions:  right groin soft without hematoma; left PT site with scant bloody ooze.  Extremities:  Brisk doppler signal left PT/peroneal.  AT is monophasic  CBC    Component Value Date/Time   WBC 7.0 01/21/2019 1805   RBC 3.36 (L) 01/21/2019 1805   HGB 10.6 (L) 01/21/2019 1805   HCT 31.1 (L) 01/21/2019 1805   PLT 219 01/21/2019 1805   MCV 92.6 01/21/2019 1805   MCH 31.5 01/21/2019 1805   MCHC 34.1 01/21/2019 1805   RDW 12.0 01/21/2019 1805   LYMPHSABS 0.4 (L) 08/28/2018 1002   MONOABS 0.4 08/28/2018 1002   EOSABS 0.0 08/28/2018 1002   BASOSABS 0.0 08/28/2018 1002    BMET    Component Value Date/Time   NA 140 01/21/2019 1046   K 3.8 01/21/2019 1046   CL 101 12/10/2018 0222   CO2 28 12/10/2018 0222   GLUCOSE 176 (H) 01/21/2019 1046   BUN 8 12/10/2018 0222   CREATININE 0.50 (L) 01/21/2019 1805   CALCIUM 8.3 (L) 12/10/2018 0222   GFRNONAA >60 01/21/2019 1805   GFRAA >60 01/21/2019 1805    INR    Component Value Date/Time   INR 1.18 09/06/2018 0650     Intake/Output Summary (Last 24 hours) at 01/22/2019 0803 Last data filed at 01/22/2019 0437 Gross per 24 hour  Intake 981.25 ml  Output 475 ml  Net 506.25 ml     Assessment:  68 y.o. male is s/p:  1.  Ultrasound-guided cannulation right common femoral artery 2.  Ultrasound-guided cannulation left posterior tibial artery 3.  Left lower extremity angiogram 4.  Stent of left SFA with two 5 x 273mm viabahn 5.  Minx device closure right common femoral artery 6.  Moderate sedation with fentanyl and Versed for 100 minutes  1 Day Post-Op  Plan:  -pt with brisk left PT and peroneal doppler signal.  (AT is monophasic) -DVT prophylaxis:  Sq heparin -hold metformin for 3 days due to receiving contrast-creatinine 0.5 yesterday -home on Plavix, asa and statin   Leontine Locket, PA-C [facility] [phone] 01/22/2019 8:03 AM   I agree with the above.  I have seen and examined the patient.  He is stable for return to SNF  [facility]

## 2019-01-22 NOTE — TOC Progression Note (Signed)
Transition of Care [facility]) - Progression Note    Patient Details  Name: Clayton [name] MRN: [id number] Date of Birth: [birth year]  Transition of Care [facility]) CM/SW [place], [place] Phone Number: 01/22/2019, 2:48 PM  Clinical Narrative:    Patient will DC to: [facility] DC Date:01/22/2019 Family Notified: Peggyann Juba, sister Transport By: Corey Harold  RN, patient, and facility notified of DC. Discharge Summary sent to facility. RN given number for [phone].  Ambulance transport requested for patient.   Clinical Social Worker signing off. Thurmond Butts, MSW, [facility] Clinical Social Worker [phone]    Expected Discharge Plan: Skilled Nursing Facility Barriers to Discharge: No Barriers Identified  Expected Discharge Plan and Services Expected Discharge Plan: [place] arrangements for the past 2 months: [place] Expected Discharge Date: 01/22/19                                     Social Determinants of Health (SDOH) Interventions    Readmission Risk Interventions Readmission Risk Prevention Plan 12/11/2018  Transportation Screening Complete  Medication Review ([place]) Complete  PCP or Specialist appointment within 3-5 days of discharge Complete  HRI or Home Care Consult Complete  SW Recovery Care/Counseling Consult Complete  Palliative Care Screening Not [place] Complete  Some recent data might be hidden

## 2019-01-22 NOTE — Telephone Encounter (Signed)
-----   Message from Waynetta Sandy, MD sent at 01/21/2019  3:53 PM EDT ----- Clayton [name] [id number] 06-13-1951  01/21/2019 Pre-operative Diagnosis: Critical left lower extremity ischemia with wounds  Surgeon:  Eda Paschal. Donzetta Matters, MD  Procedure Performed: 1.  Ultrasound-guided cannulation right common femoral artery 2.  Ultrasound-guided cannulation left posterior tibial artery 3.  Left lower extremity angiogram 4.  Stent of left SFA with two 5 x 258mm viabahn 5.  Minx device closure right common femoral artery 6.  Moderate sedation with fentanyl and Versed for 100 minutes  F/u in 3-4 weeks with me for wound check with left lower extremity duplex and ABI

## 2019-01-25 ENCOUNTER — Telehealth: Payer: Self-pay

## 2019-01-25 NOTE — Telephone Encounter (Signed)
Patients sister called and wanted to know how long patient was suppose to stay at the facility that he was sent to after his surgery.   Advised her to call the facility and ask to speak with the charge nurse as she would best be able to help with this question.   She was advised of his follow up appt next month.   York Cerise, CMA

## 2019-02-03 ENCOUNTER — Telehealth: Payer: Self-pay

## 2019-02-03 NOTE — Telephone Encounter (Signed)
Pts sister called again regarding patients stay at the facility that he is at. She said that they are telling her that they are ready for him to go home and he should be cleared but she wanted to get or opinion as she is not sure he has been there long enough.   Advised her that same as before that will be ultimately at the discretion of the facility as they are monitoring his care daily.   Explained to her as well when she asked the guidelines with insurance and that at a point they may determine that if he stays longer than recommended that they will be responsible for the cost. She said that she is aware and just wanted to be sure that he is well taken care of. She said that she has tried to talk to the floor nurse at the facility but did not hear back.   Suggested she call the DON of the facility and discuss with her. Told her that if there were concerns they would contact our office.   York Cerise, CMA

## 2019-02-15 ENCOUNTER — Other Ambulatory Visit: Payer: Self-pay

## 2019-02-15 DIAGNOSIS — I739 Peripheral vascular disease, unspecified: Secondary | ICD-10-CM

## 2019-02-19 ENCOUNTER — Ambulatory Visit (INDEPENDENT_AMBULATORY_CARE_PROVIDER_SITE_OTHER): Payer: Medicare Other | Admitting: Vascular Surgery

## 2019-02-19 ENCOUNTER — Ambulatory Visit (HOSPITAL_COMMUNITY)
Admission: RE | Admit: 2019-02-19 | Discharge: 2019-02-19 | Disposition: A | Discharge status: Home / Self Care | Payer: Medicare Other | Source: Ambulatory Visit | Attending: Vascular Surgery | Admitting: Vascular Surgery

## 2019-02-19 ENCOUNTER — Encounter: Payer: Self-pay | Admitting: Vascular Surgery

## 2019-02-19 ENCOUNTER — Other Ambulatory Visit: Payer: Self-pay

## 2019-02-19 ENCOUNTER — Ambulatory Visit (INDEPENDENT_AMBULATORY_CARE_PROVIDER_SITE_OTHER)
Admit: 2019-02-19 | Discharge: 2019-02-19 | Disposition: A | Discharge status: Home / Self Care | Payer: Medicare Other | Attending: Vascular Surgery | Admitting: Vascular Surgery

## 2019-02-19 VITALS — BP 148/81 | HR 87 | Temp 97.6°F | Resp 20 | Ht 72.0 in | Wt 215.0 lb

## 2019-02-19 DIAGNOSIS — I739 Peripheral vascular disease, unspecified: Secondary | ICD-10-CM | POA: Insufficient documentation

## 2019-02-19 DIAGNOSIS — I70244 Atherosclerosis of native arteries of left leg with ulceration of heel and midfoot: Secondary | ICD-10-CM

## 2019-02-19 DIAGNOSIS — I70245 Atherosclerosis of native arteries of left leg with ulceration of other part of foot: Secondary | ICD-10-CM | POA: Diagnosis not present

## 2019-02-19 NOTE — Progress Notes (Signed)
Patient ID: Clayton [name], male   DOB: 1951/09/01, 68 y.o.   MRN: [id number]  Reason for Consult: No chief complaint on file.   Referred by Dione Housekeeper, MD  Subjective:     HPI:  Clayton [name] is a 68 y.o. male previous right lower extremity amputation is undergone left lower extremity SFA stenting via retrograde approach.  Now follows up for wound check.  He is taking aspirin, statin, Plavix.  Preparing to go home from rehab at this time.  Denies any fevers or chills overall feeling much better.  Past Medical History:  Diagnosis Date  . Acute on chronic respiratory failure with hypoxia ([place])   . Altered mental status   . CAD (coronary artery disease)    a. s/p BMS x 3 to RCA in 2009  . COPD (chronic obstructive pulmonary disease) ([place])   . COPD, severe ([place])   . Diabetes mellitus   . Embolic stroke involving right middle cerebral artery ([place])   . GERD (gastroesophageal reflux disease)   . Guaiac positive stools 07/03/2018  . HTN (hypertension)   . Hyperlipidemia   . Myocardial infarction ([place]) 2009  . Non-STEMI (non-ST elevated myocardial infarction) ([place])   . Prostate cancer ([place])   . Pulmonary arterial hypertension ([place])   . PVD (peripheral vascular disease) (H[facility])    Family History  Problem Relation Age of Onset  . Coronary artery disease Mother   . Coronary artery disease Father   . Prostate cancer Father    Past Surgical History:  Procedure Laterality Date  . ABDOMINAL AORTOGRAM W/LOWER EXTREMITY Left 01/21/2019   Procedure: ABDOMINAL AORTOGRAM W/LOWER EXTREMITY;  Surgeon: Waynetta Sandy, MD;  Location: [facility];  Service: Cardiovascular;  Laterality: Left;  . AMPUTATION Right 12/08/2018   Procedure: AMPUTATION ABOVE KNEE;  Surgeon: Waynetta Sandy, MD;  Location: [facility];  Service: Vascular;  Laterality: Right;  . CARDIAC CATHETERIZATION     with stent  . cardiac stents  2009  . CATARACT EXTRACTION W/PHACO Left 08/19/2013   Procedure: CATARACT EXTRACTION PHACO AND INTRAOCULAR LENS PLACEMENT (IOC);  Surgeon: Tonny Branch, MD;  Location: [facility];  Service: Ophthalmology;  Laterality: Left;  CDE:37.77  . CATARACT EXTRACTION W/PHACO Right 07/29/2016   Procedure: CATARACT EXTRACTION PHACO AND INTRAOCULAR LENS PLACEMENT RIGHT EYE; CDE:  18.10;  Surgeon: Tonny Branch, MD;  Location: [facility];  Service: Ophthalmology;  Laterality: Right;  . IR CT HEAD LTD  08/28/2018  . IR PERCUTANEOUS ART THROMBECTOMY/INFUSION INTRACRANIAL INC DIAG ANGIO  08/28/2018  . LOWER EXTREMITY ANGIOGRAPHY Bilateral 12/07/2018   Procedure: LOWER EXTREMITY ANGIOGRAPHY;  Surgeon: Waynetta Sandy, MD;  Location: [facility];  Service: Cardiovascular;  Laterality: Bilateral;  . PERIPHERAL VASCULAR INTERVENTION Left 12/07/2018   Procedure: PERIPHERAL VASCULAR INTERVENTION;  Surgeon: Waynetta Sandy, MD;  Location: [facility];  Service: Cardiovascular;  Laterality: Left;  . PERIPHERAL VASCULAR INTERVENTION Left 01/21/2019   Procedure: PERIPHERAL VASCULAR INTERVENTION;  Surgeon: Waynetta Sandy, MD;  Location: [facility];  Service: Cardiovascular;  Laterality: Left;  SFA  . RADIOLOGY WITH ANESTHESIA N/A 08/28/2018   Procedure: IR WITH ANESTHESIA;  Surgeon: Radiologist, Medication, MD;  Location: [place] [facility];  Service: Radiology;  Laterality: N/A;    Short Social History:  Social History   Tobacco Use  . Smoking status: Former Smoker    Packs/day: 1.00    Years: 42.00    Pack years: 42.00    Types: Cigarettes  Start date: 07/07/1969  . Smokeless tobacco: Never Used  Substance Use Topics  . Alcohol use: Not Currently    Alcohol/week: 0.0 standard drinks    Comment: rarely    No Known Allergies  Current Outpatient Medications  Medication Sig Dispense Refill  . acetaminophen (TYLENOL) 325 MG tablet Take 650 mg by mouth every 6 (six) hours as needed for moderate pain or fever.    Marland Kitchen aspirin EC 81 MG tablet  Take 81 mg by mouth daily.    Marland Kitchen atorvastatin (LIPITOR) 40 MG tablet Take 40 mg by mouth at bedtime.     . bisacodyl (DULCOLAX) 10 MG suppository Place 1 suppository (10 mg total) rectally daily as needed for severe constipation. 12 suppository 0  . carvedilol (COREG) 6.25 MG tablet Place 1 tablet (6.25 mg total) into feeding tube 2 (two) times daily with a meal.    . chlorhexidine gluconate, MEDLINE KIT, (PERIDEX) 0.12 % solution 15 mLs by Mouth Rinse route 2 (two) times daily. (Patient taking differently: 5 mLs by Mouth Rinse route 2 (two) times daily. ) 120 mL 0  . clopidogrel (PLAVIX) 75 MG tablet Take 1 tablet (75 mg total) by mouth daily. 30 tablet 3  . Docusate Sodium 50 MG/15ML LIQD Take 50 mg by mouth 1 day or 1 dose.    Marland Kitchen guaiFENesin (MUCINEX) 600 MG 12 hr tablet Take 600 mg by mouth every 6 (six) hours as needed for cough or to loosen phlegm.    . insulin lispro (HUMALOG) 100 UNIT/ML injection Inject 5 Units into the skin 3 (three) times daily before meals.    Marland Kitchen ipratropium-albuterol (DUONEB) 0.5-2.5 (3) MG/3ML SOLN Take 3 mLs by nebulization every 6 (six) hours as needed. (Patient taking differently: Take 3 mLs by nebulization every 6 (six) hours as needed (shortness of breath). ) 360 mL   . metFORMIN (GLUCOPHAGE) 1000 MG tablet Take 1 tablet (1,000 mg total) by mouth 2 (two) times daily with a meal.    . Nutritional Supplements (PROMOD) LIQD Take 60 mLs by mouth daily.    . Omega-3 Fatty Acids (FISH OIL) 1000 MG CAPS Take 1,000-3,000 mg by mouth See admin instructions. Take 1000 mg in the morning and 3000 mg in the evening    . ondansetron (ZOFRAN) 4 MG tablet Take 4 mg by mouth every 6 (six) hours as needed for nausea or vomiting.    Marland Kitchen oxyCODONE (OXY IR/ROXICODONE) 5 MG immediate release tablet Take 1 tablet (5 mg total) by mouth every 6 (six) hours as needed for moderate pain. 20 tablet 0  . pantoprazole (PROTONIX) 40 MG tablet Take 40 mg by mouth daily.    . predniSONE (DELTASONE) 10  MG tablet Take 10 mg by mouth daily with breakfast.     No current facility-administered medications for this visit.     Review of Systems  Constitutional:  Constitutional negative. HENT: HENT negative.  Eyes: Eyes negative.  Respiratory: Respiratory negative.  Cardiovascular: Cardiovascular negative.  GI: Gastrointestinal negative.  Musculoskeletal: Positive for leg pain.  Skin: Positive for wound.  Neurological: Neurological negative. Hematologic: Hematologic/lymphatic negative.  Psychiatric: Psychiatric negative.        Objective:   Vitals:   02/19/19 1014  BP: (!) 148/81  Pulse: 87  Resp: 20  Temp: 97.6 F (36.4 C)  SpO2: 98%    Physical Exam Constitutional:      Appearance: Normal appearance.  Eyes:     Pupils: Pupils are equal, round, and reactive to light.  Cardiovascular:     Pulses:          Popliteal pulses are 0 on the right side.       Dorsalis pedis pulses are 0 on the right side and 1+ on the left side.       Posterior tibial pulses are 0 on the right side.     Comments: Strong dorsalis pedis and posterior tibial signal on the left Pulmonary:     Effort: Pulmonary effort is normal.  Abdominal:     General: Abdomen is flat.     Palpations: Abdomen is soft.  Musculoskeletal:     Comments: Well-healed right above-knee amputation Left second toe pictured below Left heel ulcer picture did not come out well but is stable 3 cm and dry  Skin:    General: Skin is warm.  Neurological:     General: No focal deficit present.     Mental Status: He is alert.  Psychiatric:        Mood and Affect: Mood normal.        Behavior: Behavior normal.        Thought Content: Thought content normal.        Judgment: Judgment normal.       Data: I have independently interpreted his ABIs to be 0.78 on the left and he has monophasic waveforms throughout the graft although velocities are maintained there is no evidence of stenosis within the stent graft.  He has a  toe pressure of 65 today.     Assessment/Plan:    68 year old male follows up after endovascular bypass of his left lower extremity.  Also recently underwent right AKA which is healing well.  He is stable toe changes on the left and a stable heel ulcer.  I discussed with he and his sister that he remains very high risk for proximal amputation on the left although the blood flow is currently good.  I recommended Betadine painting his heel as well as his second toe and protecting it from his wheelchair at all times.  I do not think I would recommend second toe amputation given the nonhealing would most certainly require BKA with concomitant heel ulceration.  This time he wants to keep his leg he has good blood flow without any evidence of infection so we will just check him again in 4 weeks      Waynetta Sandy MD [facility]

## 2019-02-22 ENCOUNTER — Telehealth: Payer: Self-pay

## 2019-02-22 NOTE — Telephone Encounter (Signed)
Sister called because she said that her brother will be going home from rehab soon and she was wanting to know what they needed to do regarding a wound on his foot that Dr Donzetta Matters said he had.   Advised her of his next appt here at the office and told her that if the would needed someone changing dressing frequently the social worker at the facility should arrange home health before discharge.   She will call us if they have any further questions.   York Cerise, CMA

## 2019-02-24 ENCOUNTER — Telehealth: Payer: Self-pay

## 2019-02-24 NOTE — Telephone Encounter (Signed)
[facility] called and said that pt needs to be seen due to pus oozing from his toe.   Appt made for pt to be seen this week for a wound check.   York Cerise, CMA

## 2019-02-26 ENCOUNTER — Ambulatory Visit (INDEPENDENT_AMBULATORY_CARE_PROVIDER_SITE_OTHER): Payer: Medicare Other | Admitting: Physician Assistant

## 2019-02-26 ENCOUNTER — Other Ambulatory Visit (HOSPITAL_COMMUNITY)
Admission: RE | Admit: 2019-02-26 | Discharge: 2019-02-26 | Disposition: A | Discharge status: Home / Self Care | Payer: Medicare Other | Source: Ambulatory Visit | Attending: Vascular Surgery | Admitting: Vascular Surgery

## 2019-02-26 ENCOUNTER — Other Ambulatory Visit: Payer: Self-pay

## 2019-02-26 ENCOUNTER — Encounter: Payer: Self-pay | Admitting: *Deleted

## 2019-02-26 VITALS — BP 94/66 | HR 100 | Temp 97.1°F | Resp 16 | Ht 72.0 in | Wt 220.0 lb

## 2019-02-26 DIAGNOSIS — I739 Peripheral vascular disease, unspecified: Secondary | ICD-10-CM

## 2019-02-26 DIAGNOSIS — L97509 Non-pressure chronic ulcer of other part of unspecified foot with unspecified severity: Secondary | ICD-10-CM | POA: Insufficient documentation

## 2019-02-26 DIAGNOSIS — Z1159 Encounter for screening for other viral diseases: Secondary | ICD-10-CM | POA: Insufficient documentation

## 2019-02-26 DIAGNOSIS — Z01812 Encounter for preprocedural laboratory examination: Secondary | ICD-10-CM | POA: Diagnosis present

## 2019-02-26 DIAGNOSIS — L97529 Non-pressure chronic ulcer of other part of left foot with unspecified severity: Secondary | ICD-10-CM

## 2019-02-26 NOTE — Progress Notes (Signed)
Established Critical Limb Ischemia Patient   History of Present Illness   Clayton [name] is a 68 y.o. ([birth year]) male who presents with worsening wounds of L 2nd toe and heel.   He is status post right above-the-knee amputation on 12/08/2018 by Clayton [name].  He also had endovascular revascularization with left SFA covered stent retrograde approach via posterior tibial artery by Clayton [name] 01/21/2019.  He was seen in office last week by Clayton [name] with a arterial duplex demonstrating a patent left SFA with patent stents.  Wounds of his left second toe and heel however have worsened over the course of the week.  He is complaining of pain and discomfort in his left heel.  In the past he has been offered left below the knee amputation however is not ready to consent.  His 2 sisters are with him today however are outside in their car.  Patient states he is ambulatory however I do not believe this to be true even recent AKA and debilitated state.  Current Outpatient Medications  Medication Sig Dispense Refill  . acetaminophen (TYLENOL) 325 MG tablet Take 650 mg by mouth every 6 (six) hours as needed for moderate pain or fever.    Marland Kitchen aspirin EC 81 MG tablet Take 81 mg by mouth daily.    Marland Kitchen atorvastatin (LIPITOR) 40 MG tablet Take 40 mg by mouth at bedtime.     . bisacodyl (DULCOLAX) 10 MG suppository Place 1 suppository (10 mg total) rectally daily as needed for severe constipation. 12 suppository 0  . carvedilol (COREG) 6.25 MG tablet Place 1 tablet (6.25 mg total) into feeding tube 2 (two) times daily with a meal.    . chlorhexidine gluconate, MEDLINE KIT, (PERIDEX) 0.12 % solution 15 mLs by Mouth Rinse route 2 (two) times daily. (Patient taking differently: 5 mLs by Mouth Rinse route 2 (two) times daily. ) 120 mL 0  . clopidogrel (PLAVIX) 75 MG tablet Take 1 tablet (75 mg total) by mouth daily. 30 tablet 3  . Docusate Sodium 50 MG/15ML LIQD Take 50 mg by mouth 1 day or 1 dose.    Marland Kitchen guaiFENesin (MUCINEX) 600  MG 12 hr tablet Take 600 mg by mouth every 6 (six) hours as needed for cough or to loosen phlegm.    . insulin lispro (HUMALOG) 100 UNIT/ML injection Inject 5 Units into the skin 3 (three) times daily before meals.    Marland Kitchen ipratropium-albuterol (DUONEB) 0.5-2.5 (3) MG/3ML SOLN Take 3 mLs by nebulization every 6 (six) hours as needed. (Patient taking differently: Take 3 mLs by nebulization every 6 (six) hours as needed (shortness of breath). ) 360 mL   . metFORMIN (GLUCOPHAGE) 1000 MG tablet Take 1 tablet (1,000 mg total) by mouth 2 (two) times daily with a meal.    . Nutritional Supplements (PROMOD) LIQD Take 60 mLs by mouth daily.    . Omega-3 Fatty Acids (FISH OIL) 1000 MG CAPS Take 1,000-3,000 mg by mouth See admin instructions. Take 1000 mg in the morning and 3000 mg in the evening    . ondansetron (ZOFRAN) 4 MG tablet Take 4 mg by mouth every 6 (six) hours as needed for nausea or vomiting.    . pantoprazole (PROTONIX) 40 MG tablet Take 40 mg by mouth daily.    . predniSONE (DELTASONE) 10 MG tablet Take 10 mg by mouth daily with breakfast.    . oxyCODONE (OXY IR/ROXICODONE) 5 MG immediate release tablet Take 1 tablet (5 mg total) by mouth  every 6 (six) hours as needed for moderate pain. (Patient not taking: Reported on 02/26/2019) 20 tablet 0   No current facility-administered medications for this visit.     On ROS today: 10 system ROS otherwise negative   Physical Examination   Vitals:   02/26/19 1118  BP: 94/66  Pulse: 100  Resp: 16  Temp: (!) 97.1 F (36.2 C)  TempSrc: Temporal  SpO2: 97%  Weight: 220 lb (99.8 kg)  Height: 6' (1.829 m)   Body mass index is 29.84 kg/m.  General Alert, O x 3, WD, NAD  Pulmonary Sym exp, good B air movt  Cardiac RRR, Nl S1, S2  Vascular Vessel Right Left  Radial Palpable Palpable  PT AKA brisk by doppler  DP AKA brisk by doppler    Gastro- intestinal soft, non-distended, non-tender to palpation,   Musculo- skeletal M/S 5/5 throughout,  left heel with large necrotic about 3 cm area with bogginess and pain to touch, no drainage; left second toe wound with purulent drainage and obliterated digit joint space, left second toe also necrotic  Neurologic Cranial nerves 2-12 intact     Medical Decision Making   Clayton [name] is a 68 y.o. male who presents with: LLE critical limb ischemia with worsening left second toe and heel wounds   Despite adequate perfusion with brisk left DP and PT signals, patient's heel and second toe wounds continue to worsen  Most concerning is his left second toe as it appears to now be frankly infected and presents a risk for systemic infection  Patient still not willing to consent to left below the knee amputation at this time even knowing risk of systemic infection  During visit I spoke over the phone with patient's sisters who were waiting in the car outside.  Patient will be posted for left second toe amputation and heel debridement, possible left below the knee amputation on Tuesday, 03/02/2019 with Clayton [name].  At the time of office visit patient only consented to toe amputation and heel debridement however agrees to further discuss left below the knee amputation with his sisters over the weekend and will have a decision for Clayton [name] next week   Clayton Ligas PA-C [facility] [place] Office: [phone]  Clinic MD: Clayton [name] was involved in the evaluation and management plan of this patient

## 2019-02-27 LAB — NOVEL CORONAVIRUS, NAA (HOSP ORDER, SEND-OUT TO REF LAB; TAT 18-24 HRS): SARS-CoV-2, NAA: NOT DETECTED

## 2019-03-01 ENCOUNTER — Other Ambulatory Visit: Payer: Self-pay

## 2019-03-01 ENCOUNTER — Encounter (HOSPITAL_COMMUNITY): Payer: Self-pay | Admitting: *Deleted

## 2019-03-01 NOTE — Progress Notes (Addendum)
Spoke with patient and caretaker Clayton [name] at patient's request. Patient denies chest pain or shob. Reports 3 week history of cough since interventional radiology procedure. [facility].  Caretaker (reports patient lives with her due to not having steps in her home and she helps him the best she can) and patient unsure the exact medications he is on and they are bringing them tomorrow. They stated patient was not on insulin for DM. Caretaker did not want to go through bottles to determine if patient was on a betablocker. Caretaker did state she knows he is on plavix and that was stopped. Will give betablocker in am if indicated. Requested anesthesia review cardiac history.  Instructions also provided to sister Dietrich Pates) at patient's request.

## 2019-03-01 NOTE — Progress Notes (Signed)
Anesthesia Chart Review: SAME DAY WORKUP   Case: [id number] Date/Time: 03/02/19 1105   Procedures:      AMPUTATION DIGIT LEFT SECOND TOE (Left )     DEBRIDEMENT WOUND LEFT HEEL (Left )     AMPUTATION BELOW KNEE LEFT (Left )   Anesthesia type: Choice   Pre-op diagnosis: PERIPHERAL VASCULAR DISEASE WITH ULCER LEFT FOOT/ NON-VIABLE TISSUE.   Location: [facility] ROOM 11 / [facility]   Surgeon: Waynetta Sandy, MD      DISCUSSION: 68 yo male former smoker (quit Dec 2019). Pertinent hx includes CAD s/p BMS x3 to RCA in 2009, hypertension, hyperlipidemia, type 2 diabetes mellitus, embolic stroke in 69/6295 with residual left sided weakness, COPD, pulmonary arterial hypertension, peripheral vascular disease s/p right AKA 12/08/2018. He also had endovascular revascularization with left SFA covered stent retrograde approach via posterior tibial artery 01/21/2019.  Pt had embolic stroke 28/4132 complicated by respiratory failure. He was intubated and ultimately required tracheostomy. He was discharged to [facility] where he slowly improved and was eventually decannulation on 09/29/18 and weaned to supplemental O2 PRN and ultimately discharged to SNF.  Pt had an episode of WCT during admission 11/2018 for scheduled right AKA. He was seen in consultation by Dr. Stanford Breed. Per his note 12/09/18:  Patient presented for planned vascular surgery for PAD and s/p right above knee amputation yesterday. Cardiology consulted today for brief run of ventricular tachycardia with rate about 440 bpm. Systolic BP in the 10'U with this. RN had patient bear down and patient converted back to sinus rhythm after about 30 seconds. Patient completely asymptomatic during entire episode. Patient is poor history and has some degree of cognitive impairment so difficult to obtain detailed history but patient denies any recent cardiac symptoms. - Currently in normal sinus rhythm on telemetry. - EKG shows normal sinus rhythm  with known RBBB and Q waves in inferior leads but no significant changes compared to prior tracings. - Patient is hemodynamically stable. Most recent BP 114/60.  - LVEF 50-55% on last Echo in 08/2018.  - Potassium 3.7 today. Goal > 4.0. Replete as needed.  - Magnesium pending. Goal > 2.0. Replete as needed.  - Continue home Coreg 6.'25mg'$  twice daily. Can increase dose if BP allows. - Will continue to monitor on telemetry. If ventricular rhythm persists, could consider repeat Echo. CAD - Patient has a history of CAD with inferior wall MI in 2009 s/p PCI with 3 overlapping BMS. - Patient denies any chest pain or other cardiac symptoms. - Continue aspirin, beta-blocker (if BP allows), and high-intensity statin. Hypertension - Most recent BP 114/60. - Continue Coreg as above. Otherwise, per primary team.  No interim changes in CV symptoms since that time. Will need DOS eval by anesthesia. Anticipate he can proceed as planned barring acute status change.  Of note, at pt's last OV with VVS, it was recommended he undergo BKA rather than toe amputation and heel debridement, but pt was reluctant. He was to talk with family and rediscuss with Dr. Donzetta Matters on DOS.  VS: There were no vitals taken for this visit.  PROVIDERS: Dione Housekeeper, MD is PCP  Kerry Hough, MD is Cardiologist  LABS: Will need DOS labs  Labs Reviewed - No data to display   EKG: 12/09/2018: Sinus rhythm with occasional Premature ventricular complexes. Rate 86. Right bundle branch block.. Inferior infarct , age undetermined. No significant change since last tracing  CV: TTE 09/01/2018: - Left ventricle: The cavity size  was normal. Systolic function was   normal. The estimated ejection fraction was in the range of 50%   to 55%. Wall motion was normal; there were no regional wall   motion abnormalities. - Ventricular septum: Septal motion showed abnormal function and   dyssynergy. - Right ventricle: The cavity size  was moderately dilated. Wall   thickness was normal. Systolic function was moderately reduced. - Right atrium: The atrium was mildly dilated. - Inferior vena cava: The vessel was normal in size.  Impressions:  - Limited study with agitated saline, negative bubble study. No ASD   or PFO.   Past Medical History:  Diagnosis Date  . Acute on chronic respiratory failure with hypoxia ([place])   . Altered mental status   . CAD (coronary artery disease)    a. s/p BMS x 3 to RCA in 2009  . COPD (chronic obstructive pulmonary disease) ([place])   . COPD, severe ([place])   . Diabetes mellitus   . Embolic stroke involving right middle cerebral artery ([place])   . GERD (gastroesophageal reflux disease)   . Guaiac positive stools 07/03/2018  . HTN (hypertension)   . Hyperlipidemia   . Myocardial infarction ([place]) 2009  . Non-STEMI (non-ST elevated myocardial infarction) ([place])   . Prostate cancer ([place])   . Pulmonary arterial hypertension ([place])   . PVD (peripheral vascular disease) ([place])     Past Surgical History:  Procedure Laterality Date  . ABDOMINAL AORTOGRAM W/LOWER EXTREMITY Left 01/21/2019   Procedure: ABDOMINAL AORTOGRAM W/LOWER EXTREMITY;  Surgeon: Waynetta Sandy, MD;  Location: [facility];  Service: Cardiovascular;  Laterality: Left;  . AMPUTATION Right 12/08/2018   Procedure: AMPUTATION ABOVE KNEE;  Surgeon: Waynetta Sandy, MD;  Location: [facility];  Service: Vascular;  Laterality: Right;  . CARDIAC CATHETERIZATION     with stent  . cardiac stents  2009  . CATARACT EXTRACTION W/PHACO Left 08/19/2013   Procedure: CATARACT EXTRACTION PHACO AND INTRAOCULAR LENS PLACEMENT (IOC);  Surgeon: Tonny Branch, MD;  Location: [facility];  Service: Ophthalmology;  Laterality: Left;  CDE:37.77  . CATARACT EXTRACTION W/PHACO Right 07/29/2016   Procedure: CATARACT EXTRACTION PHACO AND INTRAOCULAR LENS PLACEMENT RIGHT EYE; CDE:  18.10;  Surgeon: Tonny Branch, MD;  Location: [facility];  Service:  Ophthalmology;  Laterality: Right;  . IR CT HEAD LTD  08/28/2018  . IR PERCUTANEOUS ART THROMBECTOMY/INFUSION INTRACRANIAL INC DIAG ANGIO  08/28/2018  . LOWER EXTREMITY ANGIOGRAPHY Bilateral 12/07/2018   Procedure: LOWER EXTREMITY ANGIOGRAPHY;  Surgeon: Waynetta Sandy, MD;  Location: [facility];  Service: Cardiovascular;  Laterality: Bilateral;  . PERIPHERAL VASCULAR INTERVENTION Left 12/07/2018   Procedure: PERIPHERAL VASCULAR INTERVENTION;  Surgeon: Waynetta Sandy, MD;  Location: [facility];  Service: Cardiovascular;  Laterality: Left;  . PERIPHERAL VASCULAR INTERVENTION Left 01/21/2019   Procedure: PERIPHERAL VASCULAR INTERVENTION;  Surgeon: Waynetta Sandy, MD;  Location: [facility];  Service: Cardiovascular;  Laterality: Left;  SFA  . RADIOLOGY WITH ANESTHESIA N/A 08/28/2018   Procedure: IR WITH ANESTHESIA;  Surgeon: Radiologist, Medication, MD;  Location: [facility];  Service: Radiology;  Laterality: N/A;    MEDICATIONS: No current facility-administered medications for this encounter.    Marland Kitchen acetaminophen (TYLENOL) 325 MG tablet  . amLODipine (NORVASC) 5 MG tablet  . aspirin EC 81 MG tablet  . atorvastatin (LIPITOR) 40 MG tablet  . bisacodyl (DULCOLAX) 10 MG suppository  . carvedilol (COREG) 6.25 MG tablet  . chlorhexidine gluconate, MEDLINE KIT, ([place])  0.12 % solution  . clopidogrel (PLAVIX) 75 MG tablet  . docusate sodium (PHILLIPS STOOL SOFTENER) 100 MG capsule  . guaiFENesin (MUCINEX) 600 MG 12 hr tablet  . insulin lispro (HUMALOG) 100 UNIT/ML injection  . ipratropium-albuterol (DUONEB) 0.5-2.5 (3) MG/3ML SOLN  . lisinopril (ZESTRIL) 5 MG tablet  . metFORMIN (GLUCOPHAGE) 1000 MG tablet  . Nutritional Supplements (PROMOD) LIQD  . Omega-3 Fatty Acids (FISH OIL) 1000 MG CAPS  . ondansetron (ZOFRAN) 4 MG tablet  . oxyCODONE (OXY IR/ROXICODONE) 5 MG immediate release tablet  . pantoprazole (PROTONIX) 40 MG tablet  . predniSONE  (DELTASONE) 10 MG tablet  . [place]to [place], PA-C [facility]/Anesthesiology Phone [phone] 03/01/2019 1:51 PM

## 2019-03-01 NOTE — Anesthesia Preprocedure Evaluation (Addendum)
Anesthesia Evaluation  Patient identified by MRN, date of birth, ID band Patient awake    Reviewed: Allergy & Precautions, NPO status , Patient's Chart, lab work & pertinent test results  History of Anesthesia Complications Negative for: history of anesthetic complications  Airway Mallampati: II  TM Distance: >3 FB Neck ROM: Full    Dental  (+) Edentulous Upper, Edentulous Lower   Pulmonary COPD, former smoker,    Pulmonary exam normal        Cardiovascular hypertension, Pt. on medications and Pt. on home beta blockers + CAD, + Past MI, + Cardiac Stents and + Peripheral Vascular Disease (s/p R AKA)  + dysrhythmias (NSVT)  Rhythm:Regular Rate:Normal  '19 ECHO: EF 50-55, mild AS, mod TR   Neuro/Psych CVA (L sided weakness), Residual Symptoms    GI/Hepatic negative GI ROS, Neg liver ROS,   Endo/Other  diabetes (glu 152), Insulin Dependent, Oral Hypoglycemic Agents  Renal/GU negative Renal ROS     Musculoskeletal   Abdominal   Peds  Hematology  (+) Blood dyscrasia (Hb 7.5), anemia ,   Anesthesia Other Findings   Reproductive/Obstetrics                           Anesthesia Physical Anesthesia Plan  ASA: III  Anesthesia Plan: Regional and MAC   Post-op Pain Management:    Induction:   PONV Risk Score and Plan: 1 and Ondansetron and Treatment may vary due to age or medical condition  Airway Management Planned: Natural Airway and Simple Face Mask  Additional Equipment:   Intra-op Plan:   Post-operative Plan:   Informed Consent: I have reviewed the patients History and Physical, chart, labs and discussed the procedure including the risks, benefits and alternatives for the proposed anesthesia with the patient or authorized representative who has indicated his/her understanding and acceptance.       Plan Discussed with: CRNA and Surgeon  Anesthesia Plan Comments: (See PAT note by  Karoline Caldwell, PA-C Plan routine monitors, ankle block with sedation)       Anesthesia Quick Evaluation

## 2019-03-02 ENCOUNTER — Ambulatory Visit (HOSPITAL_COMMUNITY)
Admission: RE | Admit: 2019-03-02 | Discharge: 2019-03-02 | Disposition: A | Discharge status: Home / Self Care | Payer: Medicare Other | Attending: Vascular Surgery | Admitting: Vascular Surgery

## 2019-03-02 ENCOUNTER — Encounter (HOSPITAL_COMMUNITY): Payer: Self-pay

## 2019-03-02 ENCOUNTER — Inpatient Hospital Stay (HOSPITAL_COMMUNITY): Payer: Medicare Other | Admitting: Physician Assistant

## 2019-03-02 ENCOUNTER — Encounter (HOSPITAL_COMMUNITY)
Admission: RE | Disposition: A | Discharge status: Home / Self Care | Payer: Self-pay | Source: Home / Self Care | Attending: Vascular Surgery

## 2019-03-02 DIAGNOSIS — Z794 Long term (current) use of insulin: Secondary | ICD-10-CM | POA: Insufficient documentation

## 2019-03-02 DIAGNOSIS — Z7982 Long term (current) use of aspirin: Secondary | ICD-10-CM | POA: Insufficient documentation

## 2019-03-02 DIAGNOSIS — Z8546 Personal history of malignant neoplasm of prostate: Secondary | ICD-10-CM | POA: Diagnosis not present

## 2019-03-02 DIAGNOSIS — L97429 Non-pressure chronic ulcer of left heel and midfoot with unspecified severity: Secondary | ICD-10-CM | POA: Insufficient documentation

## 2019-03-02 DIAGNOSIS — Z87891 Personal history of nicotine dependence: Secondary | ICD-10-CM | POA: Insufficient documentation

## 2019-03-02 DIAGNOSIS — I69354 Hemiplegia and hemiparesis following cerebral infarction affecting left non-dominant side: Secondary | ICD-10-CM | POA: Diagnosis not present

## 2019-03-02 DIAGNOSIS — I739 Peripheral vascular disease, unspecified: Secondary | ICD-10-CM | POA: Insufficient documentation

## 2019-03-02 DIAGNOSIS — Z7952 Long term (current) use of systemic steroids: Secondary | ICD-10-CM | POA: Diagnosis not present

## 2019-03-02 DIAGNOSIS — Z89611 Acquired absence of right leg above knee: Secondary | ICD-10-CM | POA: Diagnosis not present

## 2019-03-02 DIAGNOSIS — K219 Gastro-esophageal reflux disease without esophagitis: Secondary | ICD-10-CM | POA: Diagnosis not present

## 2019-03-02 DIAGNOSIS — I252 Old myocardial infarction: Secondary | ICD-10-CM | POA: Diagnosis not present

## 2019-03-02 DIAGNOSIS — I998 Other disorder of circulatory system: Secondary | ICD-10-CM | POA: Insufficient documentation

## 2019-03-02 DIAGNOSIS — I70245 Atherosclerosis of native arteries of left leg with ulceration of other part of foot: Secondary | ICD-10-CM | POA: Diagnosis not present

## 2019-03-02 DIAGNOSIS — J449 Chronic obstructive pulmonary disease, unspecified: Secondary | ICD-10-CM | POA: Diagnosis not present

## 2019-03-02 DIAGNOSIS — L97529 Non-pressure chronic ulcer of other part of left foot with unspecified severity: Secondary | ICD-10-CM | POA: Insufficient documentation

## 2019-03-02 DIAGNOSIS — E11621 Type 2 diabetes mellitus with foot ulcer: Secondary | ICD-10-CM | POA: Diagnosis not present

## 2019-03-02 DIAGNOSIS — Z79899 Other long term (current) drug therapy: Secondary | ICD-10-CM | POA: Diagnosis not present

## 2019-03-02 DIAGNOSIS — Z7902 Long term (current) use of antithrombotics/antiplatelets: Secondary | ICD-10-CM | POA: Insufficient documentation

## 2019-03-02 DIAGNOSIS — E785 Hyperlipidemia, unspecified: Secondary | ICD-10-CM | POA: Diagnosis not present

## 2019-03-02 DIAGNOSIS — Z79891 Long term (current) use of opiate analgesic: Secondary | ICD-10-CM | POA: Insufficient documentation

## 2019-03-02 DIAGNOSIS — I10 Essential (primary) hypertension: Secondary | ICD-10-CM | POA: Diagnosis not present

## 2019-03-02 DIAGNOSIS — I2510 Atherosclerotic heart disease of native coronary artery without angina pectoris: Secondary | ICD-10-CM | POA: Insufficient documentation

## 2019-03-02 DIAGNOSIS — I70244 Atherosclerosis of native arteries of left leg with ulceration of heel and midfoot: Secondary | ICD-10-CM | POA: Diagnosis not present

## 2019-03-02 HISTORY — PX: AMPUTATION: SHX166

## 2019-03-02 HISTORY — PX: WOUND DEBRIDEMENT: SHX247

## 2019-03-02 LAB — COMPREHENSIVE METABOLIC PANEL
ALT: 13 U/L (ref 0–44)
AST: 17 U/L (ref 15–41)
Albumin: 3.6 g/dL (ref 3.5–5.0)
Alkaline Phosphatase: 102 U/L (ref 38–126)
Anion gap: 10 (ref 5–15)
BUN: 14 mg/dL (ref 8–23)
CO2: 22 mmol/L (ref 22–32)
Calcium: 9.5 mg/dL (ref 8.9–10.3)
Chloride: 103 mmol/L (ref 98–111)
Creatinine, Ser: 0.9 mg/dL (ref 0.61–1.24)
GFR calc Af Amer: >60 mL/min (ref >60)
GFR calc non Af Amer: >60 mL/min (ref >60)
Glucose, Bld: 152 mg/dL — ABNORMAL HIGH (ref 70–99)
Potassium: 4.3 mmol/L (ref 3.5–5.1)
Sodium: 135 mmol/L (ref 135–145)
Total Bilirubin: 0.8 mg/dL (ref 0.3–1.2)
Total Protein: 7.2 g/dL (ref 6.5–8.1)

## 2019-03-02 LAB — CBC
HCT: 24.7 % — ABNORMAL LOW (ref 39.0–52.0)
Hemoglobin: 7.5 g/dL — ABNORMAL LOW (ref 13.0–17.0)
MCH: 28.2 pg (ref 26.0–34.0)
MCHC: 30.4 g/dL (ref 30.0–36.0)
MCV: 92.9 fL (ref 80.0–100.0)
Platelets: 319 10*3/uL (ref 150–400)
RBC: 2.66 MIL/uL — ABNORMAL LOW (ref 4.22–5.81)
RDW: 13.6 % (ref 11.5–15.5)
WBC: 7.7 10*3/uL (ref 4.0–10.5)
nRBC: 0 % (ref 0.0–0.2)

## 2019-03-02 LAB — HEMOGLOBIN A1C
Hgb A1c MFr Bld: 5.9 % — ABNORMAL HIGH (ref 4.8–5.6)
Mean Plasma Glucose: 122.63 mg/dL

## 2019-03-02 LAB — GLUCOSE, CAPILLARY
Glucose-Capillary: 127 mg/dL — ABNORMAL HIGH (ref 70–99)
Glucose-Capillary: 123 mg/dL — ABNORMAL HIGH (ref 70–99)

## 2019-03-02 SURGERY — AMPUTATION DIGIT
Anesthesia: Monitor Anesthesia Care | Site: Toe | Laterality: Left

## 2019-03-02 MED ORDER — FENTANYL CITRATE (PF) 100 MCG/2ML IJ SOLN
50.0000 ug | Freq: Once | INTRAMUSCULAR | Status: AC
  Administered 2019-03-02: 11:00:00 50 ug via INTRAVENOUS

## 2019-03-02 MED ORDER — CEFAZOLIN SODIUM-DEXTROSE 2-4 GM/100ML-% IV SOLN
INTRAVENOUS | Status: AC
  Filled 2019-03-02: qty 100

## 2019-03-02 MED ORDER — LACTATED RINGERS IV SOLN
INTRAVENOUS | Status: DC
  Administered 2019-03-02: 11:00:00 via INTRAVENOUS

## 2019-03-02 MED ORDER — MIDAZOLAM HCL 2 MG/2ML IJ SOLN
INTRAMUSCULAR | Status: AC
  Filled 2019-03-02: qty 2

## 2019-03-02 MED ORDER — CHLORHEXIDINE GLUCONATE 4 % EX LIQD: 60.0000 mL | Freq: Once | CUTANEOUS | Status: DC

## 2019-03-02 MED ORDER — FENTANYL CITRATE (PF) 100 MCG/2ML IJ SOLN: 25.0000 ug | INTRAMUSCULAR | Status: DC | PRN

## 2019-03-02 MED ORDER — PHENYLEPHRINE 40 MCG/ML (10ML) SYRINGE FOR IV PUSH (FOR BLOOD PRESSURE SUPPORT)
PREFILLED_SYRINGE | INTRAVENOUS | Status: AC
  Filled 2019-03-02: qty 30

## 2019-03-02 MED ORDER — PHENYLEPHRINE 40 MCG/ML (10ML) SYRINGE FOR IV PUSH (FOR BLOOD PRESSURE SUPPORT)
PREFILLED_SYRINGE | INTRAVENOUS | Status: DC | PRN
  Administered 2019-03-02: 40 ug via INTRAVENOUS
  Administered 2019-03-02: 80 ug via INTRAVENOUS
  Administered 2019-03-02 (×2): 40 ug via INTRAVENOUS
  Administered 2019-03-02: 80 ug via INTRAVENOUS
  Administered 2019-03-02 (×2): 40 ug via INTRAVENOUS
  Administered 2019-03-02: 80 ug via INTRAVENOUS

## 2019-03-02 MED ORDER — PROMETHAZINE HCL 25 MG/ML IJ SOLN: 6.2500 mg | INTRAMUSCULAR | Status: DC | PRN

## 2019-03-02 MED ORDER — ONDANSETRON HCL 4 MG/2ML IJ SOLN
INTRAMUSCULAR | Status: AC
  Filled 2019-03-02: qty 2

## 2019-03-02 MED ORDER — MEPERIDINE HCL 25 MG/ML IJ SOLN: 6.2500 mg | INTRAMUSCULAR | Status: DC | PRN

## 2019-03-02 MED ORDER — 0.9 % SODIUM CHLORIDE (POUR BTL) OPTIME
TOPICAL | Status: DC | PRN
  Administered 2019-03-02: 1000 mL

## 2019-03-02 MED ORDER — FENTANYL CITRATE (PF) 100 MCG/2ML IJ SOLN: 50.0000 ug | Freq: Once | INTRAMUSCULAR | Status: DC

## 2019-03-02 MED ORDER — ONDANSETRON HCL 4 MG/2ML IJ SOLN
INTRAMUSCULAR | Status: DC | PRN
  Administered 2019-03-02: 4 mg via INTRAVENOUS

## 2019-03-02 MED ORDER — OXYCODONE HCL 5 MG PO TABS: 5.0000 mg | ORAL_TABLET | Freq: Four times a day (QID) | ORAL | 0 refills | Status: DC | PRN

## 2019-03-02 MED ORDER — FENTANYL CITRATE (PF) 250 MCG/5ML IJ SOLN
INTRAMUSCULAR | Status: AC
  Filled 2019-03-02: qty 5

## 2019-03-02 MED ORDER — BUPIVACAINE HCL (PF) 0.5 % IJ SOLN
INTRAMUSCULAR | Status: DC | PRN
  Administered 2019-03-02: 40 mL

## 2019-03-02 MED ORDER — CEFAZOLIN SODIUM-DEXTROSE 2-4 GM/100ML-% IV SOLN
2.0000 g | INTRAVENOUS | Status: AC
  Administered 2019-03-02: 11:00:00 2 g via INTRAVENOUS

## 2019-03-02 MED ORDER — PROPOFOL 500 MG/50ML IV EMUL
INTRAVENOUS | Status: DC | PRN
  Administered 2019-03-02: 50 ug/kg/min via INTRAVENOUS

## 2019-03-02 MED ORDER — FENTANYL CITRATE (PF) 100 MCG/2ML IJ SOLN
INTRAMUSCULAR | Status: AC
  Administered 2019-03-02: 11:00:00 50 ug via INTRAVENOUS
  Filled 2019-03-02: qty 2

## 2019-03-02 MED ORDER — MIDAZOLAM HCL 2 MG/2ML IJ SOLN: 0.5000 mg | Freq: Once | INTRAMUSCULAR | Status: DC | PRN

## 2019-03-02 MED ORDER — LIDOCAINE 2% (20 MG/ML) 5 ML SYRINGE
INTRAMUSCULAR | Status: AC
  Filled 2019-03-02: qty 5

## 2019-03-02 MED ORDER — LIDOCAINE HCL (PF) 1 % IJ SOLN
INTRAMUSCULAR | Status: AC
  Filled 2019-03-02: qty 30

## 2019-03-02 SURGICAL SUPPLY — 57 items
BANDAGE ACE 4X5 VEL STRL LF (GAUZE/BANDAGES/DRESSINGS) ×5 IMPLANT
BANDAGE ACE 6X5 VEL STRL LF (GAUZE/BANDAGES/DRESSINGS) IMPLANT
BANDAGE ESMARK 6X9 LF (GAUZE/BANDAGES/DRESSINGS) IMPLANT
BLADE AVERAGE 25MMX9MM (BLADE)
BLADE AVERAGE 25X9 (BLADE) IMPLANT
BLADE LONG MED 31MMX9MM (MISCELLANEOUS)
BLADE LONG MED 31X9 (MISCELLANEOUS) IMPLANT
BLADE SAW GIGLI 510 (BLADE) ×4 IMPLANT
BLADE SAW GIGLI 510MM (BLADE) ×1
BLADE SAW SGTL 81X20 HD (BLADE) IMPLANT
BNDG CMPR 9X6 STRL LF SNTH (GAUZE/BANDAGES/DRESSINGS)
BNDG COHESIVE 6X5 TAN STRL LF (GAUZE/BANDAGES/DRESSINGS) ×5 IMPLANT
BNDG ESMARK 6X9 LF (GAUZE/BANDAGES/DRESSINGS)
BNDG GAUZE ELAST 4 BULKY (GAUZE/BANDAGES/DRESSINGS) ×5 IMPLANT
CANISTER SUCT 3000ML PPV (MISCELLANEOUS) ×5 IMPLANT
CLIP VESOCCLUDE MED 6/CT (CLIP) IMPLANT
COVER SURGICAL LIGHT HANDLE (MISCELLANEOUS) ×5 IMPLANT
COVER WAND RF STERILE (DRAPES) ×3 IMPLANT
CUFF TOURNIQUET SINGLE 34IN LL (TOURNIQUET CUFF) IMPLANT
CUFF TOURNIQUET SINGLE 44IN (TOURNIQUET CUFF) IMPLANT
DRAIN CHANNEL 19F RND (DRAIN) IMPLANT
DRAPE EXTREMITY T 121X128X90 (DISPOSABLE) ×5 IMPLANT
DRAPE HALF SHEET 40X57 (DRAPES) ×5 IMPLANT
DRAPE ORTHO SPLIT 77X108 STRL (DRAPES) ×10
DRAPE SURG ORHT 6 SPLT 77X108 (DRAPES) ×6 IMPLANT
DRSG ADAPTIC 3X8 NADH LF (GAUZE/BANDAGES/DRESSINGS) ×5 IMPLANT
ELECT REM PT RETURN 9FT ADLT (ELECTROSURGICAL) ×5
ELECTRODE REM PT RTRN 9FT ADLT (ELECTROSURGICAL) ×3 IMPLANT
EVACUATOR SILICONE 100CC (DRAIN) IMPLANT
GAUZE SPONGE 4X4 12PLY STRL (GAUZE/BANDAGES/DRESSINGS) ×5 IMPLANT
GLOVE BIO SURGEON STRL SZ7.5 (GLOVE) ×5 IMPLANT
GOWN STRL REUS W/ TWL LRG LVL3 (GOWN DISPOSABLE) ×6 IMPLANT
GOWN STRL REUS W/ TWL XL LVL3 (GOWN DISPOSABLE) ×3 IMPLANT
GOWN STRL REUS W/TWL LRG LVL3 (GOWN DISPOSABLE) ×10
GOWN STRL REUS W/TWL XL LVL3 (GOWN DISPOSABLE) ×5
KIT BASIN OR (CUSTOM PROCEDURE TRAY) ×5 IMPLANT
KIT TURNOVER KIT B (KITS) ×5 IMPLANT
NDL HYPO 25GX1X1/2 BEV (NEEDLE) IMPLANT
NEEDLE HYPO 25GX1X1/2 BEV (NEEDLE) IMPLANT
NS IRRIG 1000ML POUR BTL (IV SOLUTION) ×5 IMPLANT
PACK GENERAL/GYN (CUSTOM PROCEDURE TRAY) ×5 IMPLANT
PAD ARMBOARD 7.5X6 YLW CONV (MISCELLANEOUS) ×10 IMPLANT
STAPLER VISISTAT 35W (STAPLE) ×5 IMPLANT
STOCKINETTE IMPERVIOUS LG (DRAPES) ×5 IMPLANT
SUT BONE WAX W31G (SUTURE) IMPLANT
SUT ETHILON 3 0 PS 1 (SUTURE) ×9 IMPLANT
SUT SILK 0 TIES 10X30 (SUTURE) ×5 IMPLANT
SUT SILK 2 0 (SUTURE) ×5
SUT SILK 2 0 SH CR/8 (SUTURE) ×5 IMPLANT
SUT SILK 2-0 18XBRD TIE 12 (SUTURE) ×3 IMPLANT
SUT SILK 3 0 (SUTURE)
SUT SILK 3-0 18XBRD TIE 12 (SUTURE) IMPLANT
SUT VIC AB 2-0 CT1 18 (SUTURE) ×6 IMPLANT
SYR CONTROL 10ML LL (SYRINGE) IMPLANT
TOWEL GREEN STERILE (TOWEL DISPOSABLE) ×10 IMPLANT
UNDERPAD 30X30 (UNDERPADS AND DIAPERS) ×7 IMPLANT
WATER STERILE IRR 1000ML POUR (IV SOLUTION) ×5 IMPLANT

## 2019-03-02 NOTE — Anesthesia Procedure Notes (Signed)
Procedure Name: MAC Date/Time: 03/02/2019 11:20 AM Performed by: Barrington Ellison, CRNA Pre-anesthesia Checklist: Patient identified, Emergency Drugs available, Suction available and Patient being monitored Patient Re-evaluated:Patient Re-evaluated prior to induction Oxygen Delivery Method: Simple face mask

## 2019-03-02 NOTE — Transfer of Care (Signed)
Immediate Anesthesia Transfer of Care Note  Patient: Clayton [name]  Procedure(s) Performed: AMPUTATION DIGIT LEFT SECOND TOE (Left Toe) DEBRIDEMENT WOUND LEFT HEEL (Left Foot)  Patient Location: PACU  Anesthesia Type:MAC and Regional  Level of Consciousness: awake, alert  and oriented  Airway & Oxygen Therapy: Patient Spontanous Breathing  Post-op Assessment: Report given to RN  Post vital signs: Reviewed and stable  Last Vitals:  Vitals Value Taken Time  BP    Temp    Pulse 25 03/02/19 1204  Resp 15 03/02/19 1205  SpO2 85 % 03/02/19 1204  Vitals shown include unvalidated device data.  Last Pain:  Vitals:   03/02/19 0920  TempSrc:   PainSc: 0-No pain      Patients Stated Pain Goal: 3 (27/07/86 7544)  Complications: No apparent anesthesia complications

## 2019-03-02 NOTE — Progress Notes (Signed)
Orthopedic Tech Progress Note Patient Details:  Clayton [name] May 23, 1951 [id number]  PACU RN called requesting post op shoe for patient.  Ortho Devices Type of Ortho Device: Postop shoe/boot Ortho Device/Splint Location: LLE Ortho Device/Splint Interventions: Adjustment, Application, Ordered   Post Interventions Patient Tolerated: Well Instructions Provided: Care of device, Adjustment of device   Janit Pagan 03/02/2019, 12:51 PM

## 2019-03-02 NOTE — Progress Notes (Signed)
Attempted to call patients sister Rvia- no answer or VM

## 2019-03-02 NOTE — Anesthesia Procedure Notes (Signed)
Anesthesia Regional Block: Ankle block   Pre-Anesthetic Checklist: ,, timeout performed, Correct Patient, Correct Site, Correct Laterality, Correct Procedure, Correct Position, site marked, Risks and benefits discussed,  Surgical consent,  Pre-op evaluation,  At surgeon's request and post-op pain management  Laterality: Left and Lower  Prep: chloraprep       Needles:  Injection technique: Single-shot  Needle Type: Quincke     Needle Length: 4cm  Needle Gauge: 25     Additional Needles:   (perineural infiltration)  Narrative:  Start time: 03/02/2019 10:57 AM End time: 03/02/2019 11:03 AM Injection made incrementally with aspirations every 5 mL.  Performed by: Personally  Anesthesiologist: Annye Asa, MD  Additional Notes: Pt identified in Holding room.  Monitors applied. Working IV access confirmed. Sterile prep L ankle.  #25ga Quincke with perineural infiltration around deep and sup peroneal, saph, sural, and post tib nerves.  40cc 0.5% Bupivacaine injected incrementally.  Patient asymptomatic, VSS, no heme aspirated, tolerated well.  Jenita Seashore, MD

## 2019-03-02 NOTE — H&P (Signed)
   History and Physical Update  The patient was interviewed and re-examined.  The patient's previous History and Physical has been reviewed and is unchanged from recent office visit.  We will plan to remove his left second toe and possibly debride the left heel.  Patient will not consent a below-knee amputation at this time.  Reha Martinovich C. Donzetta Matters, MD [facility] Office: [phone] Pager: [phone]   03/02/2019, 10:30 AM

## 2019-03-02 NOTE — Anesthesia Postprocedure Evaluation (Signed)
Anesthesia Post Note  Patient: Clayton [name]  Procedure(s) Performed: AMPUTATION DIGIT LEFT SECOND TOE (Left Toe) DEBRIDEMENT WOUND LEFT HEEL (Left Foot)     Patient location during evaluation: PACU Anesthesia Type: Regional Level of consciousness: awake and alert, oriented and patient cooperative Pain management: pain level controlled Vital Signs Assessment: post-procedure vital signs reviewed and stable Respiratory status: spontaneous breathing, nonlabored ventilation, respiratory function stable and patient connected to nasal cannula oxygen Cardiovascular status: blood pressure returned to baseline and stable Postop Assessment: no apparent nausea or vomiting Anesthetic complications: no    Last Vitals:  Vitals:   03/02/19 1249 03/02/19 1300  BP: 115/72   Pulse: 70   Resp: 18   Temp:  (!) 36.2 C  SpO2: 99%     Last Pain:  Vitals:   03/02/19 1300  TempSrc:   PainSc: 0-No pain                 [name],E. Nalla Purdy

## 2019-03-02 NOTE — Op Note (Signed)
    Patient name: Clayton [name] MRN: [id number] DOB: 10-30-1950 Sex: male  03/02/2019 Pre-operative Diagnosis: Critical left lower extremity ischemia with second toe and heel ulceration Post-operative diagnosis:  Same Surgeon:  Eda Paschal. Donzetta Matters, MD Procedure Performed: 1.  Left second toe amputation 2.  Left heel debridement of skin and soft tissue to 5 x 6 cm  Indications: 68 year old male with history of left lower extremity wounds.  He has undergone left lower extremity revascularization and right lower extremity amputation.  He is now indicated for second amputation and heel debridement.  Patient will not consent to below-knee amputation on the left.  Findings: There is adequate bleeding at the time potation site to expect healing.  The heel was debrided from what it was frankly necrotic tissue back to what was somewhat healthier but will be tenuous to heal at best.   Procedure:  The patient was identified in the holding area and taken to the operating room where is placed upon operative and MAC anesthesia induced.  He was sterilely prepped draped in the left lower extremity usual fashion antibiotics were administered timeout was called.  He had previously undergone ankle block this was confirmed to be effective.  Tennis racquet type incision was made around the second toe we dissected this back to the metatarsal phalangeal joint and remove the toe entirely.  Rondure was used this remove the capsule of the metatarsal bone.  I irrigated the wound obtain hemostasis.  I closed the superficial layer with interrupted 3-0 nylon suture.  I turned my attention of the heel.  I used 10 blade to debride the necrotic tissue.  Electrocautery was used to obtain hemostasis.  I only remove the skin and superficial subcutaneous tissue did not expose the bone.  I irrigated this wound and obtain hemostasis.  Wet-to-dry dressing was placed.  He tolerated procedure without immediate complication.  EBL: 10 cc    Brandon C. Donzetta Matters, MD [facility] [place] Office: [phone] Pager: [phone]

## 2019-03-03 ENCOUNTER — Encounter (HOSPITAL_COMMUNITY): Payer: Self-pay | Admitting: Vascular Surgery

## 2019-03-03 NOTE — Discharge Summary (Signed)
[facility] Discharge Summary   Patient ID:  Clayton [name] MRN: [id number] DOB/AGE: Nov 05, 1950 68 y.o.  Admit date: 03/02/2019 Discharge date: 03/02/2019 Date of Surgery: 03/02/2019 Surgeon: Surgeon(s): Waynetta Sandy, MD  Admission Diagnosis: PERIPHERAL VASCULAR DISEASE WITH ULCER LEFT FOOTNON-VIABLE TISSUE.  Discharge Diagnoses:  PERIPHERAL VASCULAR DISEASE WITH ULCER LEFT FOOTNON-VIABLE TISSUE.  Secondary Diagnoses: Past Medical History:  Diagnosis Date  . Acute on chronic respiratory failure with hypoxia ([place])   . Altered mental status   . CAD (coronary artery disease)    a. s/p BMS x 3 to RCA in 2009  . COPD (chronic obstructive pulmonary disease) ([place])   . COPD, severe ([place])   . Diabetes mellitus   . Embolic stroke involving right middle cerebral artery ([place])   . GERD (gastroesophageal reflux disease)   . Guaiac positive stools 07/03/2018  . HTN (hypertension)   . Hyperlipidemia   . Myocardial infarction ([place]) 2009  . Non-STEMI (non-ST elevated myocardial infarction) ([place])   . Prostate cancer ([place])   . Pulmonary arterial hypertension ([place])   . PVD (peripheral vascular disease) ([place])     Procedure(s): AMPUTATION DIGIT LEFT SECOND TOE DEBRIDEMENT WOUND LEFT HEEL  Discharged Condition: stable  HPI: Clayton [name] is a 68 y.o. (1950/11/04) male who presents with worsening wounds of L 2nd toe and heel.   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 Donzetta Matters.  He also had endovascular revascularization with left SFA covered stent retrograde approach via posterior tibial artery by Dr. Donzetta Matters 01/21/2019.  He was seen in office last week by Dr. Donzetta Matters with a arterial duplex demonstrating a patent left SFA with patent stents.  Wounds of his left second toe and heel however have worsened over the course of the week.  He is complaining of pain and discomfort in his left heel.  In the past he has been offered left below the knee  amputation however is not ready to consent.  He was scheduled for I & D of the left heel and amputation of the second toe.     Hospital Course:  Clayton [name] is a 67 y.o. male is S/ Procedure(s): AMPUTATION DIGIT LEFT SECOND TOE DEBRIDEMENT WOUND LEFT HEEL  Wet to dry dressing applied to the left heel and dry dressing applied to second toe amp site.  He was discharged home the same day.    Significant Diagnostic Studies: CBC Lab Results  Component Value Date   WBC 7.7 03/02/2019   HGB 7.5 (L) 03/02/2019   HCT 24.7 (L) 03/02/2019   MCV 92.9 03/02/2019   PLT 319 03/02/2019    BMET    Component Value Date/Time   NA 135 03/02/2019 0917   K 4.3 03/02/2019 0917   CL 103 03/02/2019 0917   CO2 22 03/02/2019 0917   GLUCOSE 152 (H) 03/02/2019 0917   BUN 14 03/02/2019 0917   CREATININE 0.90 03/02/2019 0917   CALCIUM 9.5 03/02/2019 0917   GFRNONAA >60 03/02/2019 0917   GFRAA >60 03/02/2019 0917   COAG Lab Results  Component Value Date   INR 1.18 09/06/2018   INR 1.10 09/02/2018   INR 1.28 08/27/2018     Disposition:  Discharge to :Home Discharge Instructions    Call MD for:  redness, tenderness, or signs of infection (pain, swelling, bleeding, redness, odor or green/yellow discharge around incision site)   Complete by: As directed    Call MD for:  severe or increased pain, loss or decreased feeling  in affected limb(s)   Complete by: As directed    Call MD for:  temperature >100.5   Complete by: As directed    Discharge instructions   Complete by: As directed    Wet to dry dressing heel and dry dressing amputation area daily.   Resume previous diet   Complete by: As directed      Allergies as of 03/02/2019   No Known Allergies     Medication List    TAKE these medications   acetaminophen 325 MG tablet Commonly known as: TYLENOL Take 650 mg by mouth every 6 (six) hours as needed for moderate pain or fever.   amLODipine 5 MG tablet Commonly known as:  NORVASC Take 5 mg by mouth daily.   aspirin EC 81 MG tablet Take 81 mg by mouth daily.   atorvastatin 40 MG tablet Commonly known as: LIPITOR Take 40 mg by mouth at bedtime.   bisacodyl 10 MG suppository Commonly known as: DULCOLAX Place 1 suppository (10 mg total) rectally daily as needed for severe constipation.   carvedilol 6.25 MG tablet Commonly known as: COREG Place 1 tablet (6.25 mg total) into feeding tube 2 (two) times daily with a meal.   chlorhexidine gluconate (MEDLINE KIT) 0.12 % solution Commonly known as: PERIDEX 15 mLs by Mouth Rinse route 2 (two) times daily. What changed: how much to take   clopidogrel 75 MG tablet Commonly known as: Plavix Take 1 tablet (75 mg total) by mouth daily.   Fish Oil 1000 MG Caps Take 1,000-3,000 mg by mouth See admin instructions. Take 1000 mg in the morning and 3000 mg in the evening   guaiFENesin 600 MG 12 hr tablet Commonly known as: MUCINEX Take 600 mg by mouth every 6 (six) hours as needed for cough or to loosen phlegm.   HumaLOG 100 UNIT/ML injection Generic drug: insulin lispro Inject 5 Units into the skin 3 (three) times daily before meals.   ipratropium-albuterol 0.5-2.5 (3) MG/3ML Soln Commonly known as: DUONEB Take 3 mLs by nebulization every 6 (six) hours as needed. What changed: reasons to take this   lisinopril 5 MG tablet Commonly known as: ZESTRIL Take 5 mg by mouth daily.   metFORMIN 1000 MG tablet Commonly known as: GLUCOPHAGE Take 1 tablet (1,000 mg total) by mouth 2 (two) times daily with a meal.   ondansetron 4 MG tablet Commonly known as: ZOFRAN Take 4 mg by mouth every 6 (six) hours as needed for nausea or vomiting.   OxyCODONE HCl (Abuse Deter) 5 MG Taba Commonly known as: OXAYDO Take 5 mg by mouth every 6 (six) hours as needed (pain).   oxyCODONE 5 MG immediate release tablet Commonly known as: Oxy IR/ROXICODONE Take 1 tablet (5 mg total) by mouth every 6 (six) hours as needed for  moderate pain.   pantoprazole 40 MG tablet Commonly known as: PROTONIX Take 40 mg by mouth daily.   Phillips Stool Softener 100 MG capsule Generic drug: docusate sodium Take 100 mg by mouth daily. Phillip's   predniSONE 10 MG tablet Commonly known as: DELTASONE Take 10 mg by mouth daily with breakfast.      Verbal and written Discharge instructions given to the patient. Wound care per Discharge AVS Follow-up Information    Waynetta Sandy, MD In 2 weeks.   Specialties: Vascular Surgery, Cardiology Why: Office will call you to arrange your appt (sent) Contact information: [address] [place] [address] [phone]  Signed: Roxy Horseman 03/03/2019, 9:49 AM

## 2019-03-04 ENCOUNTER — Telehealth: Payer: Self-pay | Admitting: Vascular Surgery

## 2019-03-04 NOTE — Telephone Encounter (Signed)
Kim with [facility] called.  She just wanted [place] to know that insurance will not pay for dry or wet to dry dressing changes.  She will teach the patient's household how to do these dressings and monitor him until his next appt with [place].    Thurston Hole., LPN

## 2019-03-08 ENCOUNTER — Ambulatory Visit: Payer: Medicare Other | Admitting: Cardiology

## 2019-03-08 NOTE — Progress Notes (Deleted)
Clinical Summary [name] is a 68 y.o.male seen today for follow up of the following medical problems.    1. CAD - hx of PCI in 2009. 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 LVEF at that time by LVgram 55%.  - 05/2015 echo LVEF 55%, mild AI - 08/2018 echo: VEF 50-55%, no WMAs   2. HTN - mixed compliance with meds  3. COPD - noted on PFTs 11/2015, he was referred to Dr Luan Pulling at that time  - mixed compliance with inhalers   4. 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 Donzetta Matters He also had endovascular revascularization with left SFA covered stent retrograde approach via posterior tibial artery by Dr. Donzetta Matters 01/21/2019 - history of critical left lower extremity ischemia, second toe ulceration.  - s/p left second toe amputation 02/2019   5. NSVT - noted during 11/2018 admission, K was 3.7 and Mg 1.1 at the time.  Past Medical History:  Diagnosis Date  . Acute on chronic respiratory failure with hypoxia ([place])   . Altered mental status   . CAD (coronary artery disease)    a. s/p BMS x 3 to RCA in 2009  . COPD (chronic obstructive pulmonary disease) ([place])   . COPD, severe ([place])   . Diabetes mellitus   . Embolic stroke involving right middle cerebral artery ([place])   . GERD (gastroesophageal reflux disease)   . Guaiac positive stools 07/03/2018  . HTN (hypertension)   . Hyperlipidemia   . Myocardial infarction ([place]) 2009  . Non-STEMI (non-ST elevated myocardial infarction) ([place])   . Prostate cancer ([place])   . Pulmonary arterial hypertension ([place])   . PVD (peripheral vascular disease) ([facility])      No Known Allergies   Current Outpatient Medications  Medication Sig Dispense Refill  . acetaminophen (TYLENOL) 325 MG tablet Take 650 mg by mouth every 6 (six) hours as needed for moderate pain or fever.    Marland Kitchen amLODipine (NORVASC) 5 MG tablet Take 5 mg by mouth daily.    Marland Kitchen aspirin EC 81 MG tablet Take 81 mg by  mouth daily.    Marland Kitchen atorvastatin (LIPITOR) 40 MG tablet Take 40 mg by mouth at bedtime.     . bisacodyl (DULCOLAX) 10 MG suppository Place 1 suppository (10 mg total) rectally daily as needed for severe constipation. 12 suppository 0  . carvedilol (COREG) 6.25 MG tablet Place 1 tablet (6.25 mg total) into feeding tube 2 (two) times daily with a meal.    . chlorhexidine gluconate, MEDLINE KIT, (PERIDEX) 0.12 % solution 15 mLs by Mouth Rinse route 2 (two) times daily. (Patient taking differently: 5 mLs by Mouth Rinse route 2 (two) times daily. ) 120 mL 0  . clopidogrel (PLAVIX) 75 MG tablet Take 1 tablet (75 mg total) by mouth daily. 30 tablet 3  . docusate sodium (PHILLIPS STOOL SOFTENER) 100 MG capsule Take 100 mg by mouth daily. Phillip's    . guaiFENesin (MUCINEX) 600 MG 12 hr tablet Take 600 mg by mouth every 6 (six) hours as needed for cough or to loosen phlegm.    . insulin lispro (HUMALOG) 100 UNIT/ML injection Inject 5 Units into the skin 3 (three) times daily before meals.    Marland Kitchen ipratropium-albuterol (DUONEB) 0.5-2.5 (3) MG/3ML SOLN Take 3 mLs by nebulization every 6 (six) hours as needed. (Patient taking differently: Take 3 mLs by nebulization every 6 (six) hours as needed (  shortness of breath). ) 360 mL   . lisinopril (ZESTRIL) 5 MG tablet Take 5 mg by mouth daily.    . metFORMIN (GLUCOPHAGE) 1000 MG tablet Take 1 tablet (1,000 mg total) by mouth 2 (two) times daily with a meal.    . Omega-3 Fatty Acids (FISH OIL) 1000 MG CAPS Take 1,000-3,000 mg by mouth See admin instructions. Take 1000 mg in the morning and 3000 mg in the evening    . ondansetron (ZOFRAN) 4 MG tablet Take 4 mg by mouth every 6 (six) hours as needed for nausea or vomiting.    Marland Kitchen oxyCODONE (OXY IR/ROXICODONE) 5 MG immediate release tablet Take 1 tablet (5 mg total) by mouth every 6 (six) hours as needed for moderate pain. 10 tablet 0  . OxyCODONE HCl, Abuse Deter, (OXAYDO) 5 MG TABA Take 5 mg by mouth every 6 (six) hours as  needed (pain).    . pantoprazole (PROTONIX) 40 MG tablet Take 40 mg by mouth daily.    . predniSONE (DELTASONE) 10 MG tablet Take 10 mg by mouth daily with breakfast.     No current facility-administered medications for this visit.      Past Surgical History:  Procedure Laterality Date  . ABDOMINAL AORTOGRAM W/LOWER EXTREMITY Left 01/21/2019   Procedure: ABDOMINAL AORTOGRAM W/LOWER EXTREMITY;  Surgeon: Waynetta Sandy, MD;  Location: [facility];  Service: Cardiovascular;  Laterality: Left;  . AMPUTATION Right 12/08/2018   Procedure: AMPUTATION ABOVE KNEE;  Surgeon: Waynetta Sandy, MD;  Location: [facility];  Service: Vascular;  Laterality: Right;  . AMPUTATION Left 03/02/2019   Procedure: AMPUTATION DIGIT LEFT SECOND TOE;  Surgeon: Waynetta Sandy, MD;  Location: [facility];  Service: Vascular;  Laterality: Left;  . CARDIAC CATHETERIZATION     with stent  . cardiac stents  2009  . CATARACT EXTRACTION W/PHACO Left 08/19/2013   Procedure: CATARACT EXTRACTION PHACO AND INTRAOCULAR LENS PLACEMENT (IOC);  Surgeon: Tonny Celicia Minahan, MD;  Location: [facility];  Service: Ophthalmology;  Laterality: Left;  CDE:37.77  . CATARACT EXTRACTION W/PHACO Right 07/29/2016   Procedure: CATARACT EXTRACTION PHACO AND INTRAOCULAR LENS PLACEMENT RIGHT EYE; CDE:  18.10;  Surgeon: Tonny Eladio Dentremont, MD;  Location: [facility];  Service: Ophthalmology;  Laterality: Right;  . IR CT HEAD LTD  08/28/2018  . IR PERCUTANEOUS ART THROMBECTOMY/INFUSION INTRACRANIAL INC DIAG ANGIO  08/28/2018  . LOWER EXTREMITY ANGIOGRAPHY Bilateral 12/07/2018   Procedure: LOWER EXTREMITY ANGIOGRAPHY;  Surgeon: Waynetta Sandy, MD;  Location: [facility];  Service: Cardiovascular;  Laterality: Bilateral;  . PERIPHERAL VASCULAR INTERVENTION Left 12/07/2018   Procedure: PERIPHERAL VASCULAR INTERVENTION;  Surgeon: Waynetta Sandy, MD;  Location: [facility];  Service: Cardiovascular;  Laterality: Left;  .  PERIPHERAL VASCULAR INTERVENTION Left 01/21/2019   Procedure: PERIPHERAL VASCULAR INTERVENTION;  Surgeon: Waynetta Sandy, MD;  Location: [facility];  Service: Cardiovascular;  Laterality: Left;  SFA  . RADIOLOGY WITH ANESTHESIA N/A 08/28/2018   Procedure: IR WITH ANESTHESIA;  Surgeon: Radiologist, Medication, MD;  Location: [facility];  Service: Radiology;  Laterality: N/A;  . WOUND DEBRIDEMENT Left 03/02/2019   Procedure: DEBRIDEMENT WOUND LEFT HEEL;  Surgeon: Waynetta Sandy, MD;  Location: [facility];  Service: Vascular;  Laterality: Left;     No Known Allergies    Family History  Problem Relation Age of Onset  . Coronary artery disease Mother   . Coronary artery disease Father   . Prostate cancer Father  Social History Mr. Karel reports that he has quit smoking. His smoking use included cigarettes. He started smoking about 49 years ago. He has a 42.00 pack-year smoking history. He has never used smokeless tobacco. Mr. Hallas reports previous alcohol use.   Review of Systems CONSTITUTIONAL: No weight loss, fever, chills, weakness or fatigue.  HEENT: Eyes: No visual loss, blurred vision, double vision or yellow sclerae.No hearing loss, sneezing, congestion, runny nose or sore throat.  SKIN: No rash or itching.  CARDIOVASCULAR:  RESPIRATORY: No shortness of breath, cough or sputum.  GASTROINTESTINAL: No anorexia, nausea, vomiting or diarrhea. No abdominal pain or blood.  GENITOURINARY: No burning on urination, no polyuria NEUROLOGICAL: No headache, dizziness, syncope, paralysis, ataxia, numbness or tingling in the extremities. No change in bowel or bladder control.  MUSCULOSKELETAL: No muscle, back pain, joint pain or stiffness.  LYMPHATICS: No enlarged nodes. No history of splenectomy.  PSYCHIATRIC: No history of depression or anxiety.  ENDOCRINOLOGIC: No reports of sweating, cold or heat intolerance. No polyuria or polydipsia.  Marland Kitchen   Physical Examination  There were no vitals filed for this visit. There were no vitals filed for this visit.  Gen: resting comfortably, no acute distress HEENT: no scleral icterus, pupils equal round and reactive, no palptable cervical adenopathy,  CV Resp: Clear to auscultation bilaterally GI: abdomen is soft, non-tender, non-distended, normal bowel sounds, no hepatosplenomegaly MSK: extremities are warm, no edema.  Skin: warm, no rash Neuro:  no focal deficits Psych: appropriate affect   Diagnostic Studies 11/2007 Cath HEMODYNAMIC RESULTS: Aorta 95/83 mmHg. Left ventricle 94/22 mmHg.  ANGIOGRAPHIC FINDINGS: 1. Left main coronary artery is free of significant flow-limiting  coronary atherosclerosis and gives rise to the left anterior  descending and circumflex vessels. 2. Left anterior descending is a medium-caliber vessel that extends  down to the apex. There are 3 diagonal branches. Within the  proximal portion of the vessel distal to the first diagonal Kahlani Graber  is an area of 40% to 50% stenosis followed by an area of 30%  stenosis in the midvessel. In the distal vessel there is an area  of 30% diffuse stenosis. Flow was TIMI-3 in this vessel. 3. The circumflex vessel is medium in caliber. There are 4 obtuse  marginal branches. The first Tauna Macfarlane is the largest. Within this  Jah Alarid is an area of relatively focal 60% to 70% stenosis  surrounded by an area of approximately 50% to 60% stenosis in  diffuse fashion. Distal to this is an area of 50% more focal  stenosis. 4. Otherwise, there are relatively mild luminal irregularities to  approximately 20% in the circumflex vessel and an area of 30%  stenosis within the fourth obtuse marginal Syvilla Martin. 5. Right coronary artery is occluded in the proximal to midvessel  segment. There are faint left-to-right collaterals that fill a  portion of the distal vessel, although the  remainder of the vessel  is not well seen.  LEFT VENTRICULOGRAPHY: Performed in the RAO projection and reveals an ejection fraction of approximately 55% with mid to basal inferior akinesis and trace mitral regurgitation.  DIAGNOSES: 1. Coronary atherosclerosis as outlined including an occluded proximal  to mid right coronary artery associated with faint left-to-right  collateral filling of the distal vessel. There is otherwise  moderate left system disease including 60% to 70% stenosis  involving the obtuse marginal and 40% to 50% stenosis in the  proximal left anterior descending. 2. Left ventricular ejection fraction of approximately 55% with mid to  basal inferior  akinesis, trace mitral regurgitation and left  ventricular end-diastolic pressure of 22 mmHg.  DISCUSSION: I reviewed the results with the patient and also discussed the films with Dr. Lia Foyer. At this point, I anticipate percutaneous intervention to treat the right coronary artery and otherwise medical therapy.   05/2015 echo  Study Conclusions  - Left ventricle: Technically dificiult study. 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 The cavity size was mildly dilated. Wall thickness was increased in a pattern of mild LVH. The estimated ejection fraction was 55%. - Aortic valve: Sclerosis without stenosis. There was mild regurgitation. - Right ventricle: The cavity size was normal. Systolic function was normal.  11/2015 PFTs Severe COPD   08/2018 echo - Left ventricle: The cavity size was normal. Systolic function was   normal. The estimated ejection fraction was in the range of 50%   to 55%. Wall motion was normal; there were no regional wall   motion abnormalities. - Ventricular septum: Septal motion showed abnormal function and   dyssynergy. - Right ventricle: The cavity size was moderately dilated. Wall   thickness was normal. Systolic  function was moderately reduced. - Right atrium: The atrium was mildly dilated. - Inferior vena cava: The vessel was normal in size.  Assessment and Plan  1. CAD - no recent chest pain, we will continue current meds  2. HTN - at goal, continue current meds  3. COPD - encouraged increased compliance with inhalers    F/u 34month       JArnoldo Lenis M.D., F.A.C.C.

## 2019-03-09 ENCOUNTER — Telehealth: Payer: Self-pay | Admitting: Physician Assistant

## 2019-03-09 NOTE — Telephone Encounter (Signed)
Virtual Visit Pre-Appointment Phone Call  "(Name), I am calling you today to discuss your upcoming appointment. We are currently trying to limit exposure to the virus that causes COVID-19 by seeing patients at home rather than in the office."  1. "What is the BEST phone number to call the day of the visit?" - include this in appointment notes  2. Do you have or have access to (through a family member/friend) a smartphone with video capability that we can use for your visit?" a. If yes - list this number in appt notes as cell (if different from BEST phone #) and list the appointment type as a VIDEO visit in appointment notes b. If no - list the appointment type as a PHONE visit in appointment notes  3. Confirm consent - "In the setting of the current Covid19 crisis, you are scheduled for a (phone or video) visit with your provider on (date) at (time).  Just as we do with many in-office visits, in order for you to participate in this visit, we must obtain consent.  If you'd like, I can send this to your mychart (if signed up) or email for you to review.  Otherwise, I can obtain your verbal consent now.  All virtual visits are billed to your insurance company just like a normal visit would be.  By agreeing to a virtual visit, we'd like you to understand that the technology does not allow for your provider to perform an examination, and thus may limit your provider's ability to fully assess your condition. If your provider identifies any concerns that need to be evaluated in person, we will make arrangements to do so.  Finally, though the technology is pretty good, we cannot assure that it will always work on either your or our end, and in the setting of a video visit, we may have to convert it to a phone-only visit.  In either situation, we cannot ensure that we have a secure connection.  Are you willing to proceed?" STAFF: Did the patient verbally acknowledge consent to telehealth visit? Document  YES/NO here: yes  4. Advise patient to be prepared - "Two hours prior to your appointment, go ahead and check your blood pressure, pulse, oxygen saturation, and your weight (if you have the equipment to check those) and write them all down. When your visit starts, your provider will ask you for this information. If you have an Apple Watch or Kardia device, please plan to have heart rate information ready on the day of your appointment. Please have a pen and paper handy nearby the day of the visit as well."  5. Give patient instructions for MyChart download to smartphone OR Doximity/Doxy.me as below if video visit (depending on what platform provider is using)  6. Inform patient they will receive a phone call 15 minutes prior to their appointment time (may be from unknown caller ID) so they should be prepared to answer    TELEPHONE CALL NOTE  Clayton [name] has been deemed a candidate for a follow-up tele-health visit to limit community exposure during the Covid-19 pandemic. I spoke with the patient via phone to ensure availability of phone/video source, confirm preferred email & phone number, and discuss instructions and expectations.  I reminded Clayton [name] to be prepared with any vital sign and/or heart rhythm information that could potentially be obtained via home monitoring, at the time of his visit. I reminded Clayton [name] to expect a phone call prior to his visit.  Clayton [name] 03/09/2019 3:46 PM   INSTRUCTIONS FOR DOWNLOADING THE MYCHART APP TO SMARTPHONE  - The patient must first make sure to have activated MyChart and know their login information - If Apple, go to [employer] and type in MyChart in the search bar and download the app. If Android, ask patient to go to [employer] and type in [place] in the search bar and download the app. The app is free but as with any other app downloads, their phone may require them to verify saved payment information or Apple/Android  password.  - The patient will need to then log into the app with their MyChart username and password, and select [facility] as their healthcare provider to link the account. When it is time for your visit, go to the MyChart app, find appointments, and click Begin Video Visit. Be sure to Select Allow for your device to access the Microphone and Camera for your visit. You will then be connected, and your provider will be with you shortly.  **If they have any issues connecting, or need assistance please contact MyChart service desk [phone])**  **If using a computer, in order to ensure the best quality for their visit they will need to use either of the following Internet Browsers: [employer], or Google Chrome**  IF USING DOXIMITY or DOXY.ME - The patient will receive a link just prior to their visit by text.     FULL LENGTH CONSENT FOR TELE-HEALTH VISIT   I hereby voluntarily request, consent and authorize [facility] and its employed or contracted physicians, physician assistants, nurse practitioners or other licensed health care professionals (the Practitioner), to provide me with telemedicine health care services (the Services") as deemed necessary by the treating Practitioner. I acknowledge and consent to receive the Services by the Practitioner via telemedicine. I understand that the telemedicine visit will involve communicating with the Practitioner through live audiovisual communication technology and the disclosure of certain medical information by electronic transmission. I acknowledge that I have been given the opportunity to request an in-person assessment or other available alternative prior to the telemedicine visit and am voluntarily participating in the telemedicine visit.  I understand that I have the right to withhold or withdraw my consent to the use of telemedicine in the course of my care at any time, without affecting my right to future care or treatment,  and that the Practitioner or I may terminate the telemedicine visit at any time. I understand that I have the right to inspect all information obtained and/or recorded in the course of the telemedicine visit and may receive copies of available information for a reasonable fee.  I understand that some of the potential risks of receiving the Services via telemedicine include:   Delay or interruption in medical evaluation due to technological equipment failure or disruption;  Information transmitted may not be sufficient (e.g. poor resolution of images) to allow for appropriate medical decision making by the Practitioner; and/or   In rare instances, security protocols could fail, causing a breach of personal health information.  Furthermore, I acknowledge that it is my responsibility to provide information about my medical history, conditions and care that is complete and accurate to the best of my ability. I acknowledge that Practitioner's advice, recommendations, and/or decision may be based on factors not within their control, such as incomplete or inaccurate data provided by me or distortions of diagnostic images or specimens that may result from electronic transmissions. I understand that the  practice of medicine is not an Chief Strategy Officer and that Practitioner makes no warranties or guarantees regarding treatment outcomes. I acknowledge that I will receive a copy of this consent concurrently upon execution via email to the email address I last provided but may also request a printed copy by calling the office of [facility].    I understand that my insurance will be billed for this visit.   I have read or had this consent read to me.  I understand the contents of this consent, which adequately explains the benefits and risks of the Services being provided via telemedicine.   I have been provided ample opportunity to ask questions regarding this consent and the Services and have had my questions  answered to my satisfaction.  I give my informed consent for the services to be provided through the use of telemedicine in my medical care  By participating in this telemedicine visit I agree to the above.

## 2019-03-10 ENCOUNTER — Ambulatory Visit: Payer: Medicare Other | Admitting: Family

## 2019-03-18 ENCOUNTER — Telehealth: Payer: Self-pay | Admitting: Vascular Surgery

## 2019-03-18 NOTE — Telephone Encounter (Addendum)
I spoke to the patient's sister, Clayton [name], and advised her of the patient's situation and that prescribing new creams and ointments will not heal non-viable tissue.    She verbalized understanding and has agreed to take the patient to the ED tomorrow.  The patient and his friend have all decided to go ahead with the amputation.  He is currently not having pain.  Clayton Hole., LPN  ----- Message from Dagoberto Ligas, PA-C sent at 03/18/2019  1:43 PM EDT ----- Regarding: RE: [place]: [phone] He has been offered a below the knee amputation several times and is not willing to consent.  I discussed the risks of delaying surgery with his sisters including sepsis and death and they are all aware.  If they are concerned about infection he needs to come to the ER.  Otherwise I am not sure when he can be worked into the schedule because I don't believe there is a PA clinic next week.   Thanks, Clayton [name] ----- Message ----- From: Kaleen Mask, LPN Sent: 0/03/1218  75:88 AM EDT To: Dagoberto Ligas, PA-C Subject: HH                                             Hey again.  I sent this message to Dr. Donzetta Matters but he is off.  Clayton [name] from [facility] called.  Pt's left heel wound has an odor and is adherent with dark brown slough.  His insurance will only allow them to come out twice a week to do wet-to-dry dressing changes.  His family says they cannot change his bandages.  He has an appt with Dr. Donzetta Matters on July 10th.  Is he ok to wait until his appt?  Please advise.   Thanks again,  Clayton Hole., LPN

## 2019-03-19 ENCOUNTER — Other Ambulatory Visit: Payer: Self-pay

## 2019-03-19 ENCOUNTER — Emergency Department (HOSPITAL_COMMUNITY): Payer: Medicare Other

## 2019-03-19 ENCOUNTER — Emergency Department (HOSPITAL_COMMUNITY)
Admission: EM | Admit: 2019-03-19 | Discharge: 2019-03-19 | Disposition: A | Discharge status: Home / Self Care | Payer: Medicare Other | Attending: Emergency Medicine | Admitting: Emergency Medicine

## 2019-03-19 DIAGNOSIS — Z79899 Other long term (current) drug therapy: Secondary | ICD-10-CM | POA: Diagnosis not present

## 2019-03-19 DIAGNOSIS — Z8546 Personal history of malignant neoplasm of prostate: Secondary | ICD-10-CM | POA: Diagnosis not present

## 2019-03-19 DIAGNOSIS — I252 Old myocardial infarction: Secondary | ICD-10-CM | POA: Insufficient documentation

## 2019-03-19 DIAGNOSIS — Z7982 Long term (current) use of aspirin: Secondary | ICD-10-CM | POA: Insufficient documentation

## 2019-03-19 DIAGNOSIS — Z9889 Other specified postprocedural states: Secondary | ICD-10-CM | POA: Insufficient documentation

## 2019-03-19 DIAGNOSIS — Z87891 Personal history of nicotine dependence: Secondary | ICD-10-CM | POA: Diagnosis not present

## 2019-03-19 DIAGNOSIS — M79672 Pain in left foot: Secondary | ICD-10-CM | POA: Diagnosis present

## 2019-03-19 DIAGNOSIS — I739 Peripheral vascular disease, unspecified: Secondary | ICD-10-CM | POA: Insufficient documentation

## 2019-03-19 DIAGNOSIS — J449 Chronic obstructive pulmonary disease, unspecified: Secondary | ICD-10-CM | POA: Diagnosis not present

## 2019-03-19 DIAGNOSIS — L89629 Pressure ulcer of left heel, unspecified stage: Secondary | ICD-10-CM | POA: Insufficient documentation

## 2019-03-19 DIAGNOSIS — I2510 Atherosclerotic heart disease of native coronary artery without angina pectoris: Secondary | ICD-10-CM | POA: Insufficient documentation

## 2019-03-19 DIAGNOSIS — I10 Essential (primary) hypertension: Secondary | ICD-10-CM | POA: Insufficient documentation

## 2019-03-19 DIAGNOSIS — Z5189 Encounter for other specified aftercare: Secondary | ICD-10-CM

## 2019-03-19 DIAGNOSIS — Z794 Long term (current) use of insulin: Secondary | ICD-10-CM | POA: Insufficient documentation

## 2019-03-19 DIAGNOSIS — E119 Type 2 diabetes mellitus without complications: Secondary | ICD-10-CM | POA: Diagnosis not present

## 2019-03-19 DIAGNOSIS — Z89611 Acquired absence of right leg above knee: Secondary | ICD-10-CM | POA: Insufficient documentation

## 2019-03-19 LAB — COMPREHENSIVE METABOLIC PANEL
ALT: 10 U/L (ref 0–44)
AST: 17 U/L (ref 15–41)
Albumin: 3.5 g/dL (ref 3.5–5.0)
Alkaline Phosphatase: 112 U/L (ref 38–126)
Anion gap: 9 (ref 5–15)
BUN: 8 mg/dL (ref 8–23)
CO2: 24 mmol/L (ref 22–32)
Calcium: 9.2 mg/dL (ref 8.9–10.3)
Chloride: 107 mmol/L (ref 98–111)
Creatinine, Ser: 0.66 mg/dL (ref 0.61–1.24)
GFR calc Af Amer: >60 mL/min (ref >60)
GFR calc non Af Amer: >60 mL/min (ref >60)
Glucose, Bld: 167 mg/dL — ABNORMAL HIGH (ref 70–99)
Potassium: 4 mmol/L (ref 3.5–5.1)
Sodium: 140 mmol/L (ref 135–145)
Total Bilirubin: 0.8 mg/dL (ref 0.3–1.2)
Total Protein: 7.4 g/dL (ref 6.5–8.1)

## 2019-03-19 LAB — URINALYSIS, ROUTINE W REFLEX MICROSCOPIC
Bacteria, UA: NONE SEEN
Bilirubin Urine: NEGATIVE
Glucose, UA: NEGATIVE mg/dL
Ketones, ur: NEGATIVE mg/dL
Leukocytes,Ua: NEGATIVE
Nitrite: NEGATIVE
Protein, ur: NEGATIVE mg/dL
Specific Gravity, Urine: 1.017 (ref 1.005–1.030)
pH: 6 (ref 5.0–8.0)

## 2019-03-19 LAB — CBC WITH DIFFERENTIAL/PLATELET
Abs Immature Granulocytes: 0.02 10*3/uL (ref 0.00–0.07)
Basophils Absolute: 0 10*3/uL (ref 0.0–0.1)
Basophils Relative: 0 %
Eosinophils Absolute: 0.1 10*3/uL (ref 0.0–0.5)
Eosinophils Relative: 3 %
HCT: 27.3 % — ABNORMAL LOW (ref 39.0–52.0)
Hemoglobin: 7.8 g/dL — ABNORMAL LOW (ref 13.0–17.0)
Immature Granulocytes: 0 %
Lymphocytes Relative: 24 %
Lymphs Abs: 1.2 10*3/uL (ref 0.7–4.0)
MCH: 25.3 pg — ABNORMAL LOW (ref 26.0–34.0)
MCHC: 28.6 g/dL — ABNORMAL LOW (ref 30.0–36.0)
MCV: 88.6 fL (ref 80.0–100.0)
Monocytes Absolute: 0.5 10*3/uL (ref 0.1–1.0)
Monocytes Relative: 10 %
Neutro Abs: 3.1 10*3/uL (ref 1.7–7.7)
Neutrophils Relative %: 63 %
Platelets: 331 10*3/uL (ref 150–400)
RBC: 3.08 MIL/uL — ABNORMAL LOW (ref 4.22–5.81)
RDW: 15.3 % (ref 11.5–15.5)
WBC: 4.9 10*3/uL (ref 4.0–10.5)
nRBC: 0 % (ref 0.0–0.2)

## 2019-03-19 LAB — LACTIC ACID, PLASMA
Lactic Acid, Venous: 1.6 mmol/L (ref 0.5–1.9)
Lactic Acid, Venous: 1.5 mmol/L (ref 0.5–1.9)

## 2019-03-19 LAB — SEDIMENTATION RATE: Sed Rate: 75 mm/hr — ABNORMAL HIGH (ref 0–16)

## 2019-03-19 LAB — C-REACTIVE PROTEIN: CRP: 0.8 mg/dL (ref <1.0)

## 2019-03-19 MED ORDER — SODIUM CHLORIDE 0.9% FLUSH
3.0000 mL | Freq: Once | INTRAVENOUS | Status: AC
  Administered 2019-03-19: 15:00:00 3 mL via INTRAVENOUS

## 2019-03-19 NOTE — ED Notes (Signed)
Patient verbalizes understanding of discharge instructions. Opportunity for questioning and answers were provided. Armband removed by staff, pt discharged from ED by personal wheelchair. This RN contacted sister Biagio Quint to pick up pt. Sister in route now

## 2019-03-19 NOTE — Consult Note (Signed)
Hospital Consult    Reason for Consult: Left heel wound and second toe amputation site wound Referring Physician: Jennelle Human emergency department PA MRN #:  [id number]  History of Present Illness: This is a 68 y.o. male status post right above-knee amputation site that has healed.  He also has a left second toe amputation and heel debridement performed by me 3 weeks ago.  He has called the office due to persistent pain.  He now presents to the emergency department.  He actually states he is not having much pain except when he walks.  Denies any fevers.  Wound care being performed by family.  He is on aspirin Plavix and statin.  Past Medical History:  Diagnosis Date  . Acute on chronic respiratory failure with hypoxia ([place])   . Altered mental status   . CAD (coronary artery disease)    a. s/p BMS x 3 to RCA in 2009  . COPD (chronic obstructive pulmonary disease) ([place])   . COPD, severe ([place])   . Diabetes mellitus   . Embolic stroke involving right middle cerebral artery ([place])   . GERD (gastroesophageal reflux disease)   . Guaiac positive stools 07/03/2018  . HTN (hypertension)   . Hyperlipidemia   . Myocardial infarction ([place]) 2009  . Non-STEMI (non-ST elevated myocardial infarction) ([place])   . Prostate cancer ([place])   . Pulmonary arterial hypertension ([place])   . PVD (peripheral vascular disease) ([place])     Past Surgical History:  Procedure Laterality Date  . ABDOMINAL AORTOGRAM W/LOWER EXTREMITY Left 01/21/2019   Procedure: ABDOMINAL AORTOGRAM W/LOWER EXTREMITY;  Surgeon: Waynetta Sandy, MD;  Location: [facility];  Service: Cardiovascular;  Laterality: Left;  . AMPUTATION Right 12/08/2018   Procedure: AMPUTATION ABOVE KNEE;  Surgeon: Waynetta Sandy, MD;  Location: [facility];  Service: Vascular;  Laterality: Right;  . AMPUTATION Left 03/02/2019   Procedure: AMPUTATION DIGIT LEFT SECOND TOE;  Surgeon: Waynetta Sandy, MD;  Location: [facility];  Service: Vascular;  Laterality: Left;  . CARDIAC CATHETERIZATION     with stent  . cardiac stents  2009  . CATARACT EXTRACTION W/PHACO Left 08/19/2013   Procedure: CATARACT EXTRACTION PHACO AND INTRAOCULAR LENS PLACEMENT (IOC);  Surgeon: Tonny Branch, MD;  Location: [facility];  Service: Ophthalmology;  Laterality: Left;  CDE:37.77  . CATARACT EXTRACTION W/PHACO Right 07/29/2016   Procedure: CATARACT EXTRACTION PHACO AND INTRAOCULAR LENS PLACEMENT RIGHT EYE; CDE:  18.10;  Surgeon: Tonny Branch, MD;  Location: [facility];  Service: Ophthalmology;  Laterality: Right;  . IR CT HEAD LTD  08/28/2018  . IR PERCUTANEOUS ART THROMBECTOMY/INFUSION INTRACRANIAL INC DIAG ANGIO  08/28/2018  . LOWER EXTREMITY ANGIOGRAPHY Bilateral 12/07/2018   Procedure: LOWER EXTREMITY ANGIOGRAPHY;  Surgeon: Waynetta Sandy, MD;  Location: [facility];  Service: Cardiovascular;  Laterality: Bilateral;  . PERIPHERAL VASCULAR INTERVENTION Left 12/07/2018   Procedure: PERIPHERAL VASCULAR INTERVENTION;  Surgeon: Waynetta Sandy, MD;  Location: [facility];  Service: Cardiovascular;  Laterality: Left;  . PERIPHERAL VASCULAR INTERVENTION Left 01/21/2019   Procedure: PERIPHERAL VASCULAR INTERVENTION;  Surgeon: Waynetta Sandy, MD;  Location: [facility];  Service: Cardiovascular;  Laterality: Left;  SFA  . RADIOLOGY WITH ANESTHESIA N/A 08/28/2018   Procedure: IR WITH ANESTHESIA;  Surgeon: Radiologist, Medication, MD;  Location: [facility];  Service: Radiology;  Laterality: N/A;  . WOUND DEBRIDEMENT Left 03/02/2019   Procedure: DEBRIDEMENT WOUND LEFT HEEL;  Surgeon: Waynetta Sandy, MD;  Location: [facility];  Service: Vascular;  Laterality: Left;    No Known Allergies  Prior to Admission medications   Medication Sig Start Date End Date Taking? Authorizing Provider  acetaminophen (TYLENOL) 325 MG tablet Take 650 mg by mouth every 6 (six) hours as needed for moderate pain or fever.   Yes  [provider]  aspirin EC 81 MG tablet Take 81 mg by mouth daily.   Yes [provider]  atorvastatin (LIPITOR) 40 MG tablet Take 40 mg by mouth at bedtime.    Yes [provider]  metFORMIN (GLUCOPHAGE) 1000 MG tablet Take 1 tablet (1,000 mg total) by mouth 2 (two) times daily with a meal. 01/24/19  Yes Rhyne, Samantha J, PA-C  amLODipine (NORVASC) 5 MG tablet Take 5 mg by mouth daily.    [provider]  bisacodyl (DULCOLAX) 10 MG suppository Place 1 suppository (10 mg total) rectally daily as needed for severe constipation. 09/05/18   Rinehuls, Early Chars, PA-C  carvedilol (COREG) 6.25 MG tablet Place 1 tablet (6.25 mg total) into feeding tube 2 (two) times daily with a meal. 09/05/18   Rinehuls, Early Chars, PA-C  chlorhexidine gluconate, MEDLINE KIT, (PERIDEX) 0.12 % solution 15 mLs by Mouth Rinse route 2 (two) times daily. Patient taking differently: 5 mLs by Mouth Rinse route 2 (two) times daily.  09/05/18   Rinehuls, Early Chars, PA-C  clopidogrel (PLAVIX) 75 MG tablet Take 1 tablet (75 mg total) by mouth daily. 01/21/19 01/21/20  Waynetta Sandy, MD  docusate sodium (PHILLIPS STOOL SOFTENER) 100 MG capsule Take 100 mg by mouth daily. Phillip's    [provider]  guaiFENesin (MUCINEX) 600 MG 12 hr tablet Take 600 mg by mouth every 6 (six) hours as needed for cough or to loosen phlegm.    [provider]  insulin lispro (HUMALOG) 100 UNIT/ML injection Inject 5 Units into the skin 3 (three) times daily before meals.    [provider]  ipratropium-albuterol (DUONEB) 0.5-2.5 (3) MG/3ML SOLN Take 3 mLs by nebulization every 6 (six) hours as needed. Patient taking differently: Take 3 mLs by nebulization every 6 (six) hours as needed (shortness of breath).  09/05/18   Rinehuls, David L, PA-C  lisinopril (ZESTRIL) 5 MG tablet Take 5 mg by mouth daily.    [provider]  Omega-3 Fatty Acids (FISH OIL) 1000 MG CAPS Take 1,000-3,000  mg by mouth See admin instructions. Take 1000 mg in the morning and 3000 mg in the evening    [provider]  ondansetron (ZOFRAN) 4 MG tablet Take 4 mg by mouth every 6 (six) hours as needed for nausea or vomiting.    [provider]  oxyCODONE (OXY IR/ROXICODONE) 5 MG immediate release tablet Take 1 tablet (5 mg total) by mouth every 6 (six) hours as needed for moderate pain. 03/02/19   Ulyses Amor, PA-C  OxyCODONE HCl, Abuse Deter, (OXAYDO) 5 MG TABA Take 5 mg by mouth every 6 (six) hours as needed (pain).    [provider]  pantoprazole (PROTONIX) 40 MG tablet Take 40 mg by mouth daily.    [provider]  predniSONE (DELTASONE) 10 MG tablet Take 10 mg by mouth daily with breakfast.    [provider]    Social History   Socioeconomic History  . Marital status: Single    Spouse name: Not on file  . Number of children: Not on file  . Years of education: Not on file  .  Highest education level: Not on file  Occupational History  . Occupation: Associate Professor  Social Needs  . Financial resource strain: Not on file  . Food insecurity    Worry: Not on file    Inability: Not on file  . Transportation needs    Medical: Not on file    Non-medical: Not on file  Tobacco Use  . Smoking status: Former Smoker    Packs/day: 1.00    Years: 42.00    Pack years: 42.00    Types: Cigarettes    Start date: 07/07/1969  . Smokeless tobacco: Never Used  Substance and Sexual Activity  . Alcohol use: Not Currently    Alcohol/week: 0.0 standard drinks    Comment: rarely  . Drug use: No  . Sexual activity: Yes  Lifestyle  . Physical activity    Days per week: Not on file    Minutes per session: Not on file  . Stress: Not on file  Relationships  . Social Herbalist on phone: Not on file    Gets together: Not on file    Attends religious service: Not on file    Active member of club or organization: Not on file    Attends meetings  of clubs or organizations: Not on file    Relationship status: Not on file  . Intimate partner violence    Fear of current or ex partner: Not on file    Emotionally abused: Not on file    Physically abused: Not on file    Forced sexual activity: Not on file  Other Topics Concern  . Not on file  Social History Narrative  . Not on file    Family History  Problem Relation Age of Onset  . Coronary artery disease Mother   . Coronary artery disease Father   . Prostate cancer Father     ROS: Heel pain is noted   Physical Examination  Vitals:   03/19/19 1645 03/19/19 1915  BP: (!) 137/109 126/67  Pulse: (!) 45 68  Resp:  18  Temp:    SpO2: 93% 98%   Body mass index is 29.84 kg/m.  General: No acute distress Pulmonary: normal non-labored breathing Cardiac: Palpable left popliteal pulse Extremities: Well-healed right above-knee amputation site, left second toe amputation site 2 sutures removed with some fat necrosis but no purulence no erythema, left heel has some fibrinous exudate which was debrided there is good healthy bleeding tissue there. Neurologic: A&O X 3  CBC    Component Value Date/Time   WBC 4.9 03/19/2019 1149   RBC 3.08 (L) 03/19/2019 1149   HGB 7.8 (L) 03/19/2019 1149   HCT 27.3 (L) 03/19/2019 1149   PLT 331 03/19/2019 1149   MCV 88.6 03/19/2019 1149   MCH 25.3 (L) 03/19/2019 1149   MCHC 28.6 (L) 03/19/2019 1149   RDW 15.3 03/19/2019 1149   LYMPHSABS 1.2 03/19/2019 1149   MONOABS 0.5 03/19/2019 1149   EOSABS 0.1 03/19/2019 1149   BASOSABS 0.0 03/19/2019 1149    BMET    Component Value Date/Time   NA 140 03/19/2019 1149   K 4.0 03/19/2019 1149   CL 107 03/19/2019 1149   CO2 24 03/19/2019 1149   GLUCOSE 167 (H) 03/19/2019 1149   BUN 8 03/19/2019 1149   CREATININE 0.66 03/19/2019 1149   CALCIUM 9.2 03/19/2019 1149   GFRNONAA >60 03/19/2019 1149   GFRAA >60 03/19/2019 1149    COAGS: Lab Results  Component Value  Date   INR 1.18 09/06/2018    INR 1.10 09/02/2018   INR 1.28 08/27/2018     ASSESSMENT/PLAN: This is a 69 y.o. male status post right above-knee amputation which is well-healed.  He is also undergone revascularization of his left lower extremity which appears patent by palpable popliteal pulse today.  Left second toe amputation site healing well with some fat necrosis 2 sutures removed today.  Left heel does have good bleeding fibrinous exudate debrided at bedside today.  He will follow-up next Friday.  Brandon C. Donzetta Matters, MD [facility] Office: [phone] Pager: [phone]

## 2019-03-19 NOTE — ED Triage Notes (Signed)
Pt from home c/o left foot second toe amputation wound "not healing". Denies fever or pain. Dressing clean dry intact. A/O X4 vss

## 2019-03-19 NOTE — ED Provider Notes (Signed)
[facility] EMERGENCY DEPARTMENT Provider Note   CSN: [id number] Arrival date & time: 03/19/19  1115     History   Chief Complaint Chief Complaint  Patient presents with  . Wound Check    HPI Clayton [name] is a 68 y.o. male who presents emergency department chief complaint of left foot pain.  The patient is a very poor historian which limits the HPI.  History is gathered predominantly from review of EMR.  Patient states that he arrives today because of pain in his left heel.  He has a known ulcer on the left heel.  He is status post left second toe amputation surgery performed by Dr. Donzetta Matters on 03/02/2019.  He has a history of peripheral vascular disease, diabetes and is status post right AKA.  Patient is unsure if someone is doing wound care and unsure of when the last time he had the bandage changed.  He denies fevers or chills.     HPI  Past Medical History:  Diagnosis Date  . Acute on chronic respiratory failure with hypoxia ([place])   . Altered mental status   . CAD (coronary artery disease)    a. s/p BMS x 3 to RCA in 2009  . COPD (chronic obstructive pulmonary disease) ([place])   . COPD, severe ([place])   . Diabetes mellitus   . Embolic stroke involving right middle cerebral artery ([place])   . GERD (gastroesophageal reflux disease)   . Guaiac positive stools 07/03/2018  . HTN (hypertension)   . Hyperlipidemia   . Myocardial infarction ([place]) 2009  . Non-STEMI (non-ST elevated myocardial infarction) ([place])   . Prostate cancer ([place])   . Pulmonary arterial hypertension ([place])   . PVD (peripheral vascular disease) [facility])     Patient Active Problem List   Diagnosis Date Noted  . Toe ulcer ([place]) 02/26/2019  . PAD (peripheral artery disease) ([place]) 12/07/2018  . Acute on chronic respiratory failure with hypoxia ([place])   . COPD, severe ([place])   . Pulmonary arterial hypertension ([place])   . Non-STEMI (non-ST elevated myocardial infarction) ([place])   . Embolic stroke involving  right middle cerebral artery ([place])   . Altered mental status   . Tracheostomy status ([place])   . Stroke (cerebrum) ([place]) 08/28/2018  . Middle cerebral artery embolism, right 08/28/2018  . Elevated LFTs 08/24/2018  . AKI (acute kidney injury) ([place]) 08/24/2018  . Acute gastroenteritis 08/24/2018  . HTN (hypertension) 08/24/2018  . Dehydration 08/24/2018  . Guaiac positive stools 07/03/2018  . Acute on chronic respiratory failure with hypoxia ([place]) 05/13/2017  . Elevated troponin 05/13/2017  . Respiratory failure ([place]) 06/28/2015  . COPD exacerbation ([place]) 06/28/2015  . DM type 2 (diabetes mellitus, type 2) ([place]) 06/28/2015  . Erectile dysfunction associated with type 2 diabetes mellitus ([place]) 07/12/2014  . CAD S/P percutaneous coronary angioplasty 04/11/2014  . Prostate cancer ([place]) 04/27/2012  . Abnormal ECG 01/06/2012  . TOBACCO ABUSE 11/23/2010  . GERD 12/12/2009  . DIVERTICULOSIS, COLON 12/12/2009  . COLONIC POLYPS, BENIGN, HX OF 12/12/2009  . ANEMIA 12/04/2009  . HYPERLIPIDEMIA 09/15/2008  . MYOCARDIAL INFARCTION, HX OF 09/15/2008  . PERIPHERAL VASCULAR DISEASE 09/15/2008  . UNSPECIFIED IRON DEFICIENCY ANEMIA 02/15/2008  . BLOOD IN STOOL, OCCULT 02/15/2008    Past Surgical History:  Procedure Laterality Date  . ABDOMINAL AORTOGRAM W/LOWER EXTREMITY Left 01/21/2019   Procedure: ABDOMINAL AORTOGRAM W/LOWER EXTREMITY;  Surgeon: Waynetta Sandy, MD;  Location: [facility];  Service: Cardiovascular;  Laterality: Left;  .  AMPUTATION Right 12/08/2018   Procedure: AMPUTATION ABOVE KNEE;  Surgeon: Waynetta Sandy, MD;  Location: [facility];  Service: Vascular;  Laterality: Right;  . AMPUTATION Left 03/02/2019   Procedure: AMPUTATION DIGIT LEFT SECOND TOE;  Surgeon: Waynetta Sandy, MD;  Location: [facility];  Service: Vascular;  Laterality: Left;  . CARDIAC CATHETERIZATION     with stent  . cardiac stents  2009  . CATARACT EXTRACTION W/PHACO Left 08/19/2013    Procedure: CATARACT EXTRACTION PHACO AND INTRAOCULAR LENS PLACEMENT (IOC);  Surgeon: Tonny Branch, MD;  Location: [facility];  Service: Ophthalmology;  Laterality: Left;  CDE:37.77  . CATARACT EXTRACTION W/PHACO Right 07/29/2016   Procedure: CATARACT EXTRACTION PHACO AND INTRAOCULAR LENS PLACEMENT RIGHT EYE; CDE:  18.10;  Surgeon: Tonny Branch, MD;  Location: [facility];  Service: Ophthalmology;  Laterality: Right;  . IR CT HEAD LTD  08/28/2018  . IR PERCUTANEOUS ART THROMBECTOMY/INFUSION INTRACRANIAL INC DIAG ANGIO  08/28/2018  . LOWER EXTREMITY ANGIOGRAPHY Bilateral 12/07/2018   Procedure: LOWER EXTREMITY ANGIOGRAPHY;  Surgeon: Waynetta Sandy, MD;  Location: [facility];  Service: Cardiovascular;  Laterality: Bilateral;  . PERIPHERAL VASCULAR INTERVENTION Left 12/07/2018   Procedure: PERIPHERAL VASCULAR INTERVENTION;  Surgeon: Waynetta Sandy, MD;  Location: [facility];  Service: Cardiovascular;  Laterality: Left;  . PERIPHERAL VASCULAR INTERVENTION Left 01/21/2019   Procedure: PERIPHERAL VASCULAR INTERVENTION;  Surgeon: Waynetta Sandy, MD;  Location: [facility];  Service: Cardiovascular;  Laterality: Left;  SFA  . RADIOLOGY WITH ANESTHESIA N/A 08/28/2018   Procedure: IR WITH ANESTHESIA;  Surgeon: Radiologist, Medication, MD;  Location: [facility];  Service: Radiology;  Laterality: N/A;  . WOUND DEBRIDEMENT Left 03/02/2019   Procedure: DEBRIDEMENT WOUND LEFT HEEL;  Surgeon: Waynetta Sandy, MD;  Location: [facility];  Service: Vascular;  Laterality: Left;        Home Medications    Prior to Admission medications   Medication Sig Start Date End Date Taking? Authorizing Provider  acetaminophen (TYLENOL) 325 MG tablet Take 650 mg by mouth every 6 (six) hours as needed for moderate pain or fever.    [provider]  amLODipine (NORVASC) 5 MG tablet Take 5 mg by mouth daily.    [provider]  aspirin EC 81 MG tablet Take 81 mg by mouth  daily.    [provider]  atorvastatin (LIPITOR) 40 MG tablet Take 40 mg by mouth at bedtime.     [provider]  bisacodyl (DULCOLAX) 10 MG suppository Place 1 suppository (10 mg total) rectally daily as needed for severe constipation. 09/05/18   Rinehuls, Early Chars, PA-C  carvedilol (COREG) 6.25 MG tablet Place 1 tablet (6.25 mg total) into feeding tube 2 (two) times daily with a meal. 09/05/18   Rinehuls, Early Chars, PA-C  chlorhexidine gluconate, MEDLINE KIT, (PERIDEX) 0.12 % solution 15 mLs by Mouth Rinse route 2 (two) times daily. Patient taking differently: 5 mLs by Mouth Rinse route 2 (two) times daily.  09/05/18   Rinehuls, Early Chars, PA-C  clopidogrel (PLAVIX) 75 MG tablet Take 1 tablet (75 mg total) by mouth daily. 01/21/19 01/21/20  Waynetta Sandy, MD  docusate sodium (PHILLIPS STOOL SOFTENER) 100 MG capsule Take 100 mg by mouth daily. Phillip's    [provider]  guaiFENesin (MUCINEX) 600 MG 12 hr tablet Take 600 mg by mouth every 6 (six) hours as needed for cough or to loosen phlegm.    [provider]  insulin lispro (  HUMALOG) 100 UNIT/ML injection Inject 5 Units into the skin 3 (three) times daily before meals.    [provider]  ipratropium-albuterol (DUONEB) 0.5-2.5 (3) MG/3ML SOLN Take 3 mLs by nebulization every 6 (six) hours as needed. Patient taking differently: Take 3 mLs by nebulization every 6 (six) hours as needed (shortness of breath).  09/05/18   Rinehuls, David L, PA-C  lisinopril (ZESTRIL) 5 MG tablet Take 5 mg by mouth daily.    [provider]  metFORMIN (GLUCOPHAGE) 1000 MG tablet Take 1 tablet (1,000 mg total) by mouth 2 (two) times daily with a meal. 01/24/19   Rhyne, Samantha J, PA-C  Omega-3 Fatty Acids (FISH OIL) 1000 MG CAPS Take 1,000-3,000 mg by mouth See admin instructions. Take 1000 mg in the morning and 3000 mg in the evening    [provider]  ondansetron (ZOFRAN) 4 MG tablet Take 4 mg by  mouth every 6 (six) hours as needed for nausea or vomiting.    [provider]  oxyCODONE (OXY IR/ROXICODONE) 5 MG immediate release tablet Take 1 tablet (5 mg total) by mouth every 6 (six) hours as needed for moderate pain. 03/02/19   Ulyses Amor, PA-C  OxyCODONE HCl, Abuse Deter, (OXAYDO) 5 MG TABA Take 5 mg by mouth every 6 (six) hours as needed (pain).    [provider]  pantoprazole (PROTONIX) 40 MG tablet Take 40 mg by mouth daily.    [provider]  predniSONE (DELTASONE) 10 MG tablet Take 10 mg by mouth daily with breakfast.    [provider]    Family History Family History  Problem Relation Age of Onset  . Coronary artery disease Mother   . Coronary artery disease Father   . Prostate cancer Father     Social History Social History   Tobacco Use  . Smoking status: Former Smoker    Packs/day: 1.00    Years: 42.00    Pack years: 42.00    Types: Cigarettes    Start date: 07/07/1969  . Smokeless tobacco: Never Used  Substance Use Topics  . Alcohol use: Not Currently    Alcohol/week: 0.0 standard drinks    Comment: rarely  . Drug use: No     Allergies   Patient has no known allergies.   Review of Systems Review of Systems  Ten systems reviewed and are negative for acute change, except as noted in the HPI.   Physical Exam Updated Vital Signs BP (!) 107/55 (BP Location: Left Arm)   Pulse 88   Temp 98.8 F (37.1 C) (Oral)   Resp 16   SpO2 97%   Physical Exam Vitals signs and nursing note reviewed.  Constitutional:      General: He is not in acute distress.    Appearance: He is well-developed. He is not diaphoretic.  HENT:     Head: Normocephalic and atraumatic.  Eyes:     General: No scleral icterus.    Conjunctiva/sclera: Conjunctivae normal.  Neck:     Musculoskeletal: Normal range of motion and neck supple.  Cardiovascular:     Rate and Rhythm: Normal rate and regular rhythm.     Heart sounds: Normal heart  sounds.  Pulmonary:     Effort: Pulmonary effort is normal. No respiratory distress.     Breath sounds: Normal breath sounds.  Abdominal:     Palpations: Abdomen is soft.     Tenderness: There is no abdominal tenderness.  Musculoskeletal:  Comments: GeneralizedLeft foot status post second toe amputation.  Macerated wound noted between the toes.  Stitches are in place.  No obvious infection or Dehiscence. There is a ulcer on the left heel which appears to be at least a stage III with macerated necrotic tissue on the surface.  It is tender to palpation.  No induration heat or swelling surrounding.  2+ DP pulse noted in the foot.  Skin:    General: Skin is warm and dry.  Neurological:     Mental Status: He is alert.  Psychiatric:        Behavior: Behavior normal.      ED Treatments / Results  Labs (all labs ordered are listed, but only abnormal results are displayed) Labs Reviewed  COMPREHENSIVE METABOLIC PANEL - Abnormal; Notable for the following components:      Result Value   Glucose, Bld 167 (*)    All other components within normal limits  CBC WITH DIFFERENTIAL/PLATELET - Abnormal; Notable for the following components:   RBC 3.08 (*)    Hemoglobin 7.8 (*)    HCT 27.3 (*)    MCH 25.3 (*)    MCHC 28.6 (*)    All other components within normal limits  CULTURE, BLOOD (ROUTINE X 2)  CULTURE, BLOOD (ROUTINE X 2)  LACTIC ACID, PLASMA  LACTIC ACID, PLASMA  URINALYSIS, ROUTINE W REFLEX MICROSCOPIC    EKG None  Radiology No results found.  Procedures Procedures (including critical care time)  Medications Ordered in ED Medications  sodium chloride flush (NS) 0.9 % injection 3 mL (has no administration in time range)     Initial Impression / Assessment and Plan / ED Course  I have reviewed the triage vital signs and the nursing notes.  Pertinent labs & imaging results that were available during my care of the patient were reviewed by me and considered in my  medical decision making (see chart for details).        HQ:PRFF pain VS:  Vitals:   03/19/19 1545 03/19/19 1600 03/19/19 1615 03/19/19 1645  BP: (!) 136/97 134/67 (!) 147/79 (!) 137/109  Pulse:   78 (!) 45  Resp:      Temp:      TempSrc:      SpO2:   99% 93%  Weight:      Height:        MB:[id number] is gathered by patient  and EMR- Phone call with Dr. Donzetta Matters of vascular surgery. JTT:[id number], vascular insufficiency, osteomyelitis. Labs: I reviewed the labs which show No leukocytosis, elevated sed rate with normal CRP, lactic acid within normal limits.  UA without infection CMP shows no significant abnormalities except for elevated blood glucose, CBC shows chronic normocytic anemia likely secondary to chronic disease.  Imaging: I personally reviewed the images (foot x-ray) which show(s) no obvious lytic lesions suggestive of osteomyelitis EKG:  MDM: Patient here for foot pain.  He has some mild dementia.  I spoke with Dr. Donzetta Matters who consulted here on the patient.  He states he is offered the patient amputation multiple times.  We reviewed the labs and imaging.  He agrees it does not look like he has acute need for admission or amputation.  Dr. Donzetta Matters saw his foot and states that it actually looks improved from previous visits.  He did some debridement of the ulcer on the back of his heel.  He will follow-up with him on Monday. Patient disposition: Discharge Patient condition: good. The patient appears reasonably screened  and/or stabilized for discharge and I doubt any other medical condition or other [facility] requiring further screening, evaluation, or treatment in the ED at this time prior to discharge. I have discussed lab and/or imaging findings with the patient and answered all questions/concerns to the best of my ability. I have discussed return precautions and OP follow up.      Final Clinical Impressions(s) / ED Diagnoses   Final diagnoses:  None    ED Discharge Orders    None        Margarita Mail, PA-C 03/19/19 1945    Julianne Rice, MD 03/20/19 1230

## 2019-03-19 NOTE — Discharge Instructions (Signed)
Your work-up today and x-ray were negative for any abnormalities today.  Please call Dr. Donzetta Matters, your surgeon, first thing Monday morning to be seen in his office. Return to the emergency department if you develop any severe pain, fever or chills.

## 2019-03-21 ENCOUNTER — Telehealth (HOSPITAL_BASED_OUTPATIENT_CLINIC_OR_DEPARTMENT_OTHER): Payer: Self-pay | Admitting: Emergency Medicine

## 2019-03-21 LAB — BLOOD CULTURE ID PANEL (REFLEXED)

## 2019-03-23 LAB — CULTURE, BLOOD (ROUTINE X 2)

## 2019-03-24 ENCOUNTER — Telehealth: Payer: Self-pay | Admitting: Emergency Medicine

## 2019-03-24 LAB — CULTURE, BLOOD (ROUTINE X 2)
Culture: NO GROWTH
Special Requests: ADEQUATE

## 2019-03-24 NOTE — Telephone Encounter (Signed)
Post ED Visit - Positive Culture Follow-up  Culture report reviewed by antimicrobial stewardship pharmacist: [facility] Team []  Elenor Quinones, Pharm.D. []  Heide Guile, Pharm.D., BCPS AQ-ID []  Parks Neptune, Pharm.D., BCPS []  Alycia Rossetti, Pharm.D., BCPS []  [place], Pharm.D., BCPS, AAHIVP []  Legrand Como, Pharm.D., BCPS, AAHIVP [x]  Salome Arnt, PharmD, BCPS []  Johnnette Gourd, PharmD, BCPS []  Hughes Better, PharmD, BCPS []  Leeroy Cha, PharmD []  Laqueta Linden, PharmD, BCPS []  Albertina Parr, PharmD  [facility] Team []  Leodis Sias, PharmD []  Lindell Spar, PharmD []  Royetta Asal, PharmD []  Graylin Shiver, Rph []  Rema Fendt) Glennon Mac, PharmD []  Arlyn Dunning, PharmD []  Netta Cedars, PharmD []  Dia Sitter, PharmD []  Leone Haven, PharmD []  Gretta Arab, PharmD []  Theodis Shove, PharmD []  Peggyann Juba, PharmD []  Reuel Boom, PharmD   Positive blood culture Patient previously called on 7/5 2020 to return to ED, no further patient follow-up is required at this time.  Hazle Nordmann 03/24/2019, 11:56 AM

## 2019-03-26 ENCOUNTER — Other Ambulatory Visit: Payer: Self-pay

## 2019-03-26 ENCOUNTER — Ambulatory Visit (INDEPENDENT_AMBULATORY_CARE_PROVIDER_SITE_OTHER): Payer: Self-pay | Admitting: Vascular Surgery

## 2019-03-26 ENCOUNTER — Encounter: Payer: Self-pay | Admitting: Vascular Surgery

## 2019-03-26 VITALS — BP 117/72 | HR 90 | Temp 98.1°F | Resp 20 | Ht 72.0 in

## 2019-03-26 DIAGNOSIS — I739 Peripheral vascular disease, unspecified: Secondary | ICD-10-CM

## 2019-03-26 NOTE — Progress Notes (Signed)
    Subjective:     Patient ID: Arrie Zuercher, male   DOB: 20-Sep-1950, 68 y.o.   MRN: [id number]  HPI 68 year old male with ulceration to his left heel and status post left second toe amputation.  I debrided the heel the other day in the ER and removed a few sutures.  He is here for wound check today.  Not having any fevers.  During this visit I did discuss with the sister via telephone.  They are doing wet-to-dry dressings with the help of home health nursing at this time.  Review of Systems Continued wounds.    Objective:   Physical Exam Vitals:   03/26/19 0824  BP: 117/72  Pulse: 90  Resp: 20  Temp: 98.1 F (36.7 C)  SpO2: 97%   He is awake and alert Nonlabored respirations Palpable popliteal pulse on the left very strong signal at the anterior tibial and posterior tibial arteries Left heel with stable 3 cm ulceration was debrided to healthy bleeding tissue Sutures removed from left second toe amputation site.    Assessment:     68 year old male status post left lower extremity revascularization with SFA stenting.  He has very strong signals at his foot and bleeding from the heel site second toe amputation site is healing well and all sutures removed today.    Plan:     Continue wet-to-dry dressings Follow-up in 2 weeks for wound check     Makeila Yamaguchi C. Donzetta Matters, MD [facility] Office: [phone] Pager: [phone]

## 2019-03-29 ENCOUNTER — Telehealth: Payer: Medicare Other | Admitting: Physician Assistant

## 2019-04-01 ENCOUNTER — Ambulatory Visit (INDEPENDENT_AMBULATORY_CARE_PROVIDER_SITE_OTHER): Payer: Medicare Other | Admitting: Internal Medicine

## 2019-04-02 ENCOUNTER — Ambulatory Visit: Payer: Medicare Other | Admitting: Family

## 2019-04-05 ENCOUNTER — Other Ambulatory Visit: Payer: Self-pay

## 2019-04-05 ENCOUNTER — Other Ambulatory Visit (INDEPENDENT_AMBULATORY_CARE_PROVIDER_SITE_OTHER): Payer: Self-pay | Admitting: Internal Medicine

## 2019-04-05 ENCOUNTER — Encounter (INDEPENDENT_AMBULATORY_CARE_PROVIDER_SITE_OTHER): Payer: Self-pay | Admitting: Internal Medicine

## 2019-04-05 ENCOUNTER — Ambulatory Visit (INDEPENDENT_AMBULATORY_CARE_PROVIDER_SITE_OTHER): Payer: Medicare Other | Admitting: Internal Medicine

## 2019-04-05 VITALS — BP 104/67 | HR 91 | Temp 97.9°F | Ht 72.0 in | Wt 213.0 lb

## 2019-04-05 DIAGNOSIS — D508 Other iron deficiency anemias: Secondary | ICD-10-CM | POA: Diagnosis not present

## 2019-04-05 NOTE — Patient Instructions (Signed)
Labs today

## 2019-04-05 NOTE — Progress Notes (Signed)
   Subjective:    Patient ID: Clayton [name], male    DOB: 02/19/1951, 68 y.o.   MRN: [id number] Patient did not bring medication list in. I have requested this.  Did not obtain weight. Patient is unable to stand. Rt below knee amputation.  Wt in chart 213 last week. HPI Referred by Dr Edrick Oh for anemia. Hx of same.  03/02/2019 Left second toe amputation, Left heel debridement of skin and soft tissue. By Dr. Donzetta Matters.Patient denies any rectal bleeding. He did have rectal bleeding last year and was suppose to f/u up for a colonoscopy. His stools are brown. Appetite is okay.  No family hx of colon cancer. Recent hx of CVA in March, Spent rehab at [facility] in [place]. Hx of acute on chronic respiratory failure wit hypoxia.  Hx of CAD s/p percutaneous coronary angioplasty. COPD, DM 2 for years, MI  CBC Latest Ref Rng & Units 03/19/2019 03/02/2019 01/21/2019  WBC 4.0 - 10.5 K/uL 4.9 7.7 7.0  Hemoglobin 13.0 - 17.0 g/dL 7.8(L) 7.5(L) 10.6(L)  Hematocrit 39.0 - 52.0 % 27.3(L) 24.7(L) 31.1(L)  Platelets 150 - 400 K/uL 331 319 219        01/04/2010 Capsule Endoscopy: Dr. Olevia Perches. (anemia and hem positive stool. Complete study.. Few duodenal erosions, Small AVM at 17 minutes. 2 larger AVMs at around 3 hrs-Almost 3 hrs beyond first duodenal image and therefore not reachable with enteroscopy.   His last colonoscopy was in 2009. Elicia Lamp).  Evaluation of: Anemia with low iron saturation. Positive fecal occult blood test per home screening. which revealed Assessment Abnormal examination, see findings above.  Diagnoses: 211.3: Colon Polyps.  562.10: Diverticulosis.  Biopsy benign polypoid colonic mucosa. No adenomatous change or evidence of malignancy.   02/17/2008 EGD biopsy: Esophagus, biopsies: GE junction mucosa with mild inflammation consistent with GE reflux. No intestinal metaplasia, dysplasia, or malignancy identifiedl    Objective:   Physical Exam Blood pressure 104/67, pulse 91,  temperature 97.9 F (36.6 C), height 6' (1.829 m), weight 213 lb (96.6 kg). Alert and oriented. Skin warm and dry. Oral mucosa is moist.   . Sclera anicteric, conjunctivae is pink. Thyroid not enlarged. No cervical lymphadenopathy. Lungs clear. Heart regular rate and rhythm.  Abdomen is soft. Bowel sounds are positive. No hepatomegaly. No abdominal masses felt. No tenderness.  No edema to lower extremities.     Examined from w/c.        Assessment & Plan:  Anemia. AM going to get iron studies. Patient will need a colonoscopy and EGD.  Will get iron studies, CBC

## 2019-04-08 ENCOUNTER — Encounter (INDEPENDENT_AMBULATORY_CARE_PROVIDER_SITE_OTHER): Payer: Self-pay | Admitting: *Deleted

## 2019-04-08 DIAGNOSIS — D649 Anemia, unspecified: Secondary | ICD-10-CM | POA: Insufficient documentation

## 2019-04-09 ENCOUNTER — Other Ambulatory Visit: Payer: Self-pay

## 2019-04-09 ENCOUNTER — Ambulatory Visit (INDEPENDENT_AMBULATORY_CARE_PROVIDER_SITE_OTHER): Payer: Self-pay | Admitting: Family

## 2019-04-09 ENCOUNTER — Encounter: Payer: Self-pay | Admitting: Family

## 2019-04-09 VITALS — BP 93/60 | HR 79 | Temp 97.1°F | Resp 18 | Ht 72.0 in | Wt 220.0 lb

## 2019-04-09 DIAGNOSIS — Z89422 Acquired absence of other left toe(s): Secondary | ICD-10-CM

## 2019-04-09 DIAGNOSIS — I739 Peripheral vascular disease, unspecified: Secondary | ICD-10-CM

## 2019-04-09 NOTE — Progress Notes (Signed)
[facility] [place]   CC: Follow up peripheral artery occlusive disease  History of Present Illness Troy Kanouse is a 68 y.o. male who is s/p Left second toe amputation and left heel debridement of skin and soft tissue to 5 x 6 cm on 03-02-19 by Dr. Donzetta Matters for critical left lower extremity ischemia with second toe and heel ulceration.  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 Donzetta Matters for critical right lower extremity ischemia with severe tissue loss.  Dr. Donzetta Matters last evaluated him on 03-26-19. At that time he advised continue wet-to-dry dressings and follow-up in 2 weeks for wound check.  Pt denies fever or chills. He does not seem to have pain in his left foot. Sister with him states that [facility] is changing his right foot heel and 2nd toe amputation site  Dressings once/week. Sister states that pt girlfriend changes dressing a fe days in between the weekly [place] visits.   He had a stroke, and is confined to getting around in a w/c.  Diabetic: Yes Tobacco use: former smoker  Pt meds include: Statin :Yes Betablocker: Yes ASA: Yes Other anticoagulants/antiplatelets: Plavix   Past Medical History:  Diagnosis Date  . Acute on chronic respiratory failure with hypoxia ([place])   . Altered mental status   . CAD (coronary artery disease)    a. s/p BMS x 3 to RCA in 2009  . COPD (chronic obstructive pulmonary disease) ([place])   . COPD, severe ([place])   . Diabetes mellitus   . Embolic stroke involving right middle cerebral artery ([place])   . GERD (gastroesophageal reflux disease)   . Guaiac positive stools 07/03/2018  . HTN (hypertension)   . Hyperlipidemia   . Myocardial infarction ([place]) 2009  . Non-STEMI (non-ST elevated myocardial infarction) ([place])   . Prostate cancer ([place])   . Pulmonary arterial hypertension ([employer])   . PVD (peripheral vascular disease) ([place])     Social History Social History   Tobacco Use  . Smoking status: Former Smoker    Packs/day: 1.00    Years:  42.00    Pack years: 42.00    Types: Cigarettes    Start date: 07/07/1969  . Smokeless tobacco: Never Used  Substance Use Topics  . Alcohol use: Not Currently    Alcohol/week: 0.0 standard drinks    Comment: rarely  . Drug use: No    Family History Family History  Problem Relation Age of Onset  . Coronary artery disease Mother   . Coronary artery disease Father   . Prostate cancer Father     Past Surgical History:  Procedure Laterality Date  . ABDOMINAL AORTOGRAM W/LOWER EXTREMITY Left 01/21/2019   Procedure: ABDOMINAL AORTOGRAM W/LOWER EXTREMITY;  Surgeon: Waynetta Sandy, MD;  Location: [place] [facility];  Service: Cardiovascular;  Laterality: Left;  . AMPUTATION Right 12/08/2018   Procedure: AMPUTATION ABOVE KNEE;  Surgeon: Waynetta Sandy, MD;  Location: [place];  Service: Vascular;  Laterality: Right;  . AMPUTATION Left 03/02/2019   Procedure: AMPUTATION DIGIT LEFT SECOND TOE;  Surgeon: Waynetta Sandy, MD;  Location: [place];  Service: Vascular;  Laterality: Left;  . CARDIAC CATHETERIZATION     with stent  . cardiac stents  2009  . CATARACT EXTRACTION W/PHACO Left 08/19/2013   Procedure: CATARACT EXTRACTION PHACO AND INTRAOCULAR LENS PLACEMENT (IOC);  Surgeon: Tonny Branch, MD;  [facility]: [facility];  Service: Ophthalmology;  Laterality: Left;  CDE:37.77  . CATARACT EXTRACTION W/PHACO Right 07/29/2016  Procedure: CATARACT EXTRACTION PHACO AND INTRAOCULAR LENS PLACEMENT RIGHT EYE; CDE:  18.10;  Surgeon: Tonny Branch, MD;  Location: [facility];  Service: Ophthalmology;  Laterality: Right;  . IR CT HEAD LTD  08/28/2018  . IR PERCUTANEOUS ART THROMBECTOMY/INFUSION INTRACRANIAL INC DIAG ANGIO  08/28/2018  . LOWER EXTREMITY ANGIOGRAPHY Bilateral 12/07/2018   Procedure: LOWER EXTREMITY ANGIOGRAPHY;  Surgeon: Waynetta Sandy, MD;  Location: [place] [facility];  Service: Cardiovascular;  Laterality: Bilateral;  . PERIPHERAL VASCULAR INTERVENTION Left 12/07/2018    Procedure: PERIPHERAL VASCULAR INTERVENTION;  Surgeon: Waynetta Sandy, MD;  Location: [place] [facility];  Service: Cardiovascular;  Laterality: Left;  . PERIPHERAL VASCULAR INTERVENTION Left 01/21/2019   Procedure: PERIPHERAL VASCULAR INTERVENTION;  Surgeon: Waynetta Sandy, MD;  Location: [place] [facility];  Service: Cardiovascular;  Laterality: Left;  SFA  . RADIOLOGY WITH ANESTHESIA N/A 08/28/2018   Procedure: IR WITH ANESTHESIA;  Surgeon: Radiologist, Medication, MD;  Location: [place];  Service: Radiology;  Laterality: N/A;  . WOUND DEBRIDEMENT Left 03/02/2019   Procedure: DEBRIDEMENT WOUND LEFT HEEL;  Surgeon: Waynetta Sandy, MD;  Location: [place];  Service: Vascular;  Laterality: Left;    No Known Allergies  Current Outpatient Medications  Medication Sig Dispense Refill  . atorvastatin (LIPITOR) 40 MG tablet Take 40 mg by mouth at bedtime.     . clopidogrel (PLAVIX) 75 MG tablet Take 1 tablet (75 mg total) by mouth daily. 30 tablet 3  . docusate sodium (PHILLIPS STOOL SOFTENER) 100 MG capsule Take 100 mg by mouth daily as needed. Phillip's     . guaiFENesin (MUCINEX) 600 MG 12 hr tablet Take 600 mg by mouth every 6 (six) hours as needed for cough or to loosen phlegm.    . insulin lispro (HUMALOG) 100 UNIT/ML injection Inject 5 Units into the skin 3 (three) times daily before meals.    Marland Kitchen lisinopril (ZESTRIL) 5 MG tablet Take 5 mg by mouth daily.    . metFORMIN (GLUCOPHAGE) 1000 MG tablet Take 1 tablet (1,000 mg total) by mouth 2 (two) times daily with a meal.    . Omega-3 Fatty Acids (FISH OIL) 1000 MG CAPS Take 1,000-3,000 mg by mouth See admin instructions. Take 1000 mg in the morning and 3000 mg in the evening    . acetaminophen (TYLENOL) 325 MG tablet Take 650 mg by mouth every 6 (six) hours as needed for moderate pain or fever.    Marland Kitchen amLODipine (NORVASC) 5 MG tablet Take 5 mg by mouth daily.    Marland Kitchen aspirin EC 81 MG tablet Take 81 mg by mouth daily.    .  bisacodyl (DULCOLAX) 10 MG suppository Place 1 suppository (10 mg total) rectally daily as needed for severe constipation. (Patient not taking: Reported on 04/09/2019) 12 suppository 0  . carvedilol (COREG) 6.25 MG tablet Place 1 tablet (6.25 mg total) into feeding tube 2 (two) times daily with a meal. (Patient not taking: Reported on 04/05/2019)    . chlorhexidine gluconate, MEDLINE KIT, (PERIDEX) 0.12 % solution 15 mLs by Mouth Rinse route 2 (two) times daily. (Patient not taking: Reported on 04/05/2019) 120 mL 0  . ipratropium-albuterol (DUONEB) 0.5-2.5 (3) MG/3ML SOLN Take 3 mLs by nebulization every 6 (six) hours as needed. (Patient not taking: Reported on 04/05/2019) 360 mL   . ondansetron (ZOFRAN) 4 MG tablet Take 4 mg by mouth every 6 (six) hours as needed for nausea or vomiting.    Marland Kitchen oxyCODONE (OXY IR/ROXICODONE) 5 MG immediate  release tablet Take 1 tablet (5 mg total) by mouth every 6 (six) hours as needed for moderate pain. (Patient not taking: Reported on 04/05/2019) 10 tablet 0  . OxyCODONE HCl, Abuse Deter, (OXAYDO) 5 MG TABA Take 5 mg by mouth every 6 (six) hours as needed (pain).    . pantoprazole (PROTONIX) 40 MG tablet Take 40 mg by mouth daily.    . predniSONE (DELTASONE) 10 MG tablet Take 10 mg by mouth daily with breakfast.     No current facility-administered medications for this visit.     ROS: See HPI for pertinent positives and negatives.   Physical Examination  Vitals:   04/09/19 1146  BP: 93/60  Pulse: 79  Resp: 18  Temp: (!) 97.1 F (36.2 C)  TempSrc: Temporal  SpO2: 97%  Weight: 220 lb (99.8 kg)  Height: 6' (1.829 m)   Body mass index is 29.84 kg/m.  General: A&O x 3, WDWN, male. Gait: seated in w/c HEENT: No gross abnormalities.  Pulmonary: Respirations are non labored, CTAB, fair air movement in all fields, no rales, rhonchi, or wheezes.  Cardiac: regular rhythm, no detected murmur.         Carotid Bruits Right Left   Negative Negative   Radial  pulses are palpable bilaterally   Adominal aortic pulse is not palpable                         VASCULAR EXAM: Extremities with ischemic changes, without Gangrene; with open wounds at left heel and left 2nd toe amputation site. See photos below.  Right AKA stump is well healed    Left heel ulcer    Left 2nd toe amputation site healing, showing signs of contracting and granulating                                                                                                            LE Pulses Right Left       FEMORAL   palpable   palpable        POPLITEAL  AKA   not palpable       POSTERIOR TIBIAL  AKA   not palpable        DORSALIS PEDIS      ANTERIOR TIBIAL AKA  1+ palpable    Abdomen: soft, NT, no palpable masses. Skin: no rashes, no cellulitis, no ulcers noted. Musculoskeletal: no muscle wasting or atrophy. See Extremities  Neurologic: A&O X 3; appropriate affect, Sensation is normal; MOTOR FUNCTION:  moving all extremities. Speech is slightly slurred but understandable. CN 2-12 grossly intact. Psychiatric: Thought content is normal, mood appropriate for clinical situation.     ASSESSMENT: Maddix Heinz is a 68 y.o. male who is s/p Left second toe amputation and left heel debridement of skin and soft tissue to 5 x 6 cm on 03-02-19 by Dr. Donzetta Matters for critical left lower extremity ischemia with second toe and heel ulceration.  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 Donzetta Matters for critical  right lower extremity ischemia with severe tissue loss.  His right AKA is well healed. He does not ambulate per sister present; he has had a stoke. He gets around in a w/c.   Left DP pulse is palpable. Left 2nd toe amputation site shows signs of contacting and granulating.  Left heel ulcer has fibrinous exudate mixed with many small patches of red healthy tissue.  See Plan.    PLAN:  Based on the patient's HPI and examination, pt will return to clinic in 2 weeks for wounds check of  left heel and left 2nd toe amputation site.   Will refer to wound care center in [place] or [place], primarily to address left heel ulcer; left 2nd toe amputation site wound is healing with W-D NS daily dressing changes. But the wound care center may address this also if they like.   The patient was given information about PAD including signs, symptoms, treatment, what symptoms should prompt the patient to seek immediate medical care, and risk reduction measures to take.  Clemon Chambers, RN, MSN, FNP-C [facility] [employer] Phone: [phone]  Clinic MD: Donzetta Matters  04/09/19 12:26 PM

## 2019-04-09 NOTE — Patient Instructions (Signed)
Peripheral Vascular Disease  Peripheral vascular disease (PVD) is a disease of the blood vessels that are not part of your heart and brain. A simple term for PVD is poor circulation. In most cases, PVD narrows the blood vessels that carry blood from your heart to the rest of your body. This can reduce the supply of blood to your arms, legs, and internal organs, like your stomach or kidneys. However, PVD most often affects a person's lower legs and feet. Without treatment, PVD tends to get worse. PVD can also lead to acute ischemic limb. This is when an arm or leg suddenly cannot get enough blood. This is a medical emergency. Follow these instructions at home: Lifestyle  Do not use any products that contain nicotine or tobacco, such as cigarettes and e-cigarettes. If you need help quitting, ask your doctor.  Lose weight if you are overweight. Or, stay at a healthy weight as told by your doctor.  Eat a diet that is low in fat and cholesterol. If you need help, ask your doctor.  Exercise regularly. Ask your doctor for activities that are right for you. General instructions  Take over-the-counter and prescription medicines only as told by your doctor.  Take good care of your feet: ? Wear comfortable shoes that fit well. ? Check your feet often for any cuts or sores.  Keep all follow-up visits as told by your doctor This is important. Contact a doctor if:  You have cramps in your legs when you walk.  You have leg pain when you are at rest.  You have coldness in a leg or foot.  Your skin changes.  You are unable to get or have an erection (erectile dysfunction).  You have cuts or sores on your feet that do not heal. Get help right away if:  Your arm or leg turns cold, numb, and blue.  Your arms or legs become red, warm, swollen, painful, or numb.  You have chest pain.  You have trouble breathing.  You suddenly have weakness in your face, arm, or leg.  You become very  confused or you cannot speak.  You suddenly have a very bad headache.  You suddenly cannot see. Summary  Peripheral vascular disease (PVD) is a disease of the blood vessels.  A simple term for PVD is poor circulation. Without treatment, PVD tends to get worse.  Treatment may include exercise, low fat and low cholesterol diet, and quitting smoking. This information is not intended to replace advice given to you by your health care provider. Make sure you discuss any questions you have with your health care provider. Document Released: 11/27/2009 Document Revised: 08/15/2017 Document Reviewed: 10/10/2016 Elsevier Patient Education  2020 Elsevier Inc.  

## 2019-04-28 ENCOUNTER — Telehealth: Payer: Medicare Other | Admitting: Cardiology

## 2019-04-28 ENCOUNTER — Encounter: Payer: Self-pay | Admitting: Family

## 2019-04-28 ENCOUNTER — Other Ambulatory Visit: Payer: Self-pay

## 2019-04-28 ENCOUNTER — Ambulatory Visit (INDEPENDENT_AMBULATORY_CARE_PROVIDER_SITE_OTHER): Payer: Self-pay | Admitting: Family

## 2019-04-28 ENCOUNTER — Ambulatory Visit: Payer: Medicare Other | Admitting: Cardiology

## 2019-04-28 VITALS — BP 118/77 | Temp 97.6°F | Resp 20 | Ht 72.0 in | Wt 162.0 lb

## 2019-04-28 DIAGNOSIS — Z89511 Acquired absence of right leg below knee: Secondary | ICD-10-CM

## 2019-04-28 DIAGNOSIS — I779 Disorder of arteries and arterioles, unspecified: Secondary | ICD-10-CM

## 2019-04-28 NOTE — Progress Notes (Signed)
VASC[facility]IN SPECIAL[facility] OF [place]   CC: Follow up peripheral artery occlusive disease  History of Present Illness Clayton [name] is a 68 y.o. male who is s/p left second toe amputation and left heel debridement of skin and soft tissue to 5 x 6 cm on 03-02-19 by Dr. Donzetta Matters for critical left lower extremity ischemia with second toe and heel ulceration.  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 Donzetta Matters for critical right lower extremity ischemia with severe tissue loss.  Dr. Donzetta Matters last evaluated him on 03-26-19. At that time he advised continue wet-to-dry dressings and follow-up in 2 weeks for wound check.  Pt denies fever or chills. He does not seem to have pain in his left foot. Sister with him states that [facility] is changing his right foot heel and 2nd toe amputation site dressings once/week. Sister states that pt girlfriend changes dressing a few days in between the weekly [facility] visits.   He had a stroke, and is confined to getting around in a w/c.  He is scheduled to be admitted to [facility] on 05-07-19 for anemia and a colonoscopy.   He was admitted to [facility] for osteomyelitis of left foot. He has a PICC line and is receiving IV antibx. He now resides at [facility] and [facility].  The SNF is addressing his left heel ulcer with dressing changes.   Diabetic: Yes Tobacco use: former smoker  Pt meds include: Statin :Yes Betablocker: Yes ASA: Yes Other anticoagulants/antiplatelets: Plavix  Past Medical History:  Diagnosis Date  . Acute on chronic respiratory failure with hypoxia ([place])   . Altered mental status   . CAD (coronary artery disease)    a. s/p BMS x 3 to RCA in 2009  . COPD (chronic obstructive pulmonary disease) ([place])   . COPD, severe ([place])   . Diabetes mellitus   . Embolic stroke involving right middle cerebral artery ([place])   . GERD (gastroesophageal reflux disease)   . Guaiac positive stools 07/03/2018  . HTN (hypertension)   .  Hyperlipidemia   . Myocardial infarction ([place]) 2009  . Non-STEMI (non-ST elevated myocardial infarction) ([place])   . Prostate cancer ([place])   . Pulmonary arterial hypertension ([place])   . PVD (peripheral vascular disease) ([place])     Social History Social History   Tobacco Use  . Smoking status: Former Smoker    Packs/day: 1.00    Years: 42.00    Pack years: 42.00    Types: Cigarettes    Start date: 07/07/1969  . Smokeless tobacco: Never Used  Substance Use Topics  . Alcohol use: Not Currently    Alcohol/week: 0.0 standard drinks    Comment: rarely  . Drug use: No    Family History Family History  Problem Relation Age of Onset  . Coronary artery disease Mother   . Coronary artery disease Father   . Prostate cancer Father     Past Surgical History:  Procedure Laterality Date  . ABDOMINAL AORTOGRAM W/LOWER EXTREMITY Left 01/21/2019   Procedure: ABDOMINAL AORTOGRAM W/LOWER EXTREMITY;  Surgeon: Waynetta Sandy, MD;  Location: [place] [facility];  Service: Cardiovascular;  Laterality: Left;  . AMPUTATION Right 12/08/2018   Procedure: AMPUTATION ABOVE KNEE;  Surgeon: Waynetta Sandy, MD;  Location: [place];  Service: Vascular;  Laterality: Right;  . AMPUTATION Left 03/02/2019   Procedure: AMPUTATION DIGIT LEFT SECOND TOE;  Surgeon: Waynetta Sandy, MD;  Location: [place];  Service: Vascular;  Laterality: Left;  . CARDIAC CATHETERIZATION     with stent  . cardiac stents  2009  . CATARACT EXTRACTION W/PHACO Left 08/19/2013   Procedure: CATARACT EXTRACTION PHACO AND INTRAOCULAR LENS PLACEMENT (IOC);  Surgeon: Tonny Branch, MD;  [facility];  Service: Ophthalmology;  Laterality: Left;  CDE:37.77  . CATARACT EXTRACTION W/PHACO Right 07/29/2016   Procedure: CATARACT EXTRACTION PHACO AND INTRAOCULAR LENS PLACEMENT RIGHT EYE; CDE:  18.10;  Surgeon: Tonny Branch, MD;  Location: [facility];  Service: Ophthalmology;  Laterality: Right;  . IR CT HEAD LTD  08/28/2018  . IR  PERCUTANEOUS ART THROMBECTOMY/INFUSION INTRACRANIAL INC DIAG ANGIO  08/28/2018  . LOWER EXTREMITY ANGIOGRAPHY Bilateral 12/07/2018   Procedure: LOWER EXTREMITY ANGIOGRAPHY;  Surgeon: Waynetta Sandy, MD;  Location: [place] [facility];  Service: Cardiovascular;  Laterality: Bilateral;  . PERIPHERAL VASCULAR INTERVENTION Left 12/07/2018   Procedure: PERIPHERAL VASCULAR INTERVENTION;  Surgeon: Waynetta Sandy, MD;  Location: [place] [facility];  Service: Cardiovascular;  Laterality: Left;  . PERIPHERAL VASCULAR INTERVENTION Left 01/21/2019   Procedure: PERIPHERAL VASCULAR INTERVENTION;  Surgeon: Waynetta Sandy, MD;  Location: [place] [facility];  Service: Cardiovascular;  Laterality: Left;  SFA  . RADIOLOGY WITH ANESTHESIA N/A 08/28/2018   Procedure: IR WITH ANESTHESIA;  Surgeon: Radiologist, Medication, MD;  Location: [place];  Service: Radiology;  Laterality: N/A;  . WOUND DEBRIDEMENT Left 03/02/2019   Procedure: DEBRIDEMENT WOUND LEFT HEEL;  Surgeon: Waynetta Sandy, MD;  Location: [place];  Service: Vascular;  Laterality: Left;    No Known Allergies  Current Outpatient Medications  Medication Sig Dispense Refill  . acetaminophen (TYLENOL) 325 MG tablet Take 650 mg by mouth every 6 (six) hours as needed for moderate pain or fever.    Marland Kitchen amLODipine (NORVASC) 5 MG tablet Take 5 mg by mouth daily.    Marland Kitchen aspirin EC 81 MG tablet Take 81 mg by mouth daily.    Marland Kitchen atorvastatin (LIPITOR) 40 MG tablet Take 40 mg by mouth at bedtime.     . bisacodyl (DULCOLAX) 10 MG suppository Place 1 suppository (10 mg total) rectally daily as needed for severe constipation. 12 suppository 0  . carvedilol (COREG) 6.25 MG tablet Place 1 tablet (6.25 mg total) into feeding tube 2 (two) times daily with a meal.    . chlorhexidine gluconate, MEDLINE KIT, (PERIDEX) 0.12 % solution 15 mLs by Mouth Rinse route 2 (two) times daily. 120 mL 0  . clopidogrel (PLAVIX) 75 MG tablet Take 1 tablet (75 mg  total) by mouth daily. 30 tablet 3  . docusate sodium (PHILLIPS STOOL SOFTENER) 100 MG capsule Take 100 mg by mouth daily as needed. Phillip's     . guaiFENesin (MUCINEX) 600 MG 12 hr tablet Take 600 mg by mouth every 6 (six) hours as needed for cough or to loosen phlegm.    . insulin lispro (HUMALOG) 100 UNIT/ML injection Inject 5 Units into the skin 3 (three) times daily before meals.    Marland Kitchen ipratropium-albuterol (DUONEB) 0.5-2.5 (3) MG/3ML SOLN Take 3 mLs by nebulization every 6 (six) hours as needed. 360 mL   . lisinopril (ZESTRIL) 5 MG tablet Take 5 mg by mouth daily.    . metFORMIN (GLUCOPHAGE) 1000 MG tablet Take 1 tablet (1,000 mg total) by mouth 2 (two) times daily with a meal.    . Omega-3 Fatty Acids (FISH OIL) 1000 MG CAPS Take 1,000-3,000 mg by mouth See admin instructions. Take 1000 mg in the morning and 3000  mg in the evening    . ondansetron (ZOFRAN) 4 MG tablet Take 4 mg by mouth every 6 (six) hours as needed for nausea or vomiting.    Marland Kitchen oxyCODONE (OXY IR/ROXICODONE) 5 MG immediate release tablet Take 1 tablet (5 mg total) by mouth every 6 (six) hours as needed for moderate pain. 10 tablet 0  . OxyCODONE HCl, Abuse Deter, (OXAYDO) 5 MG TABA Take 5 mg by mouth every 6 (six) hours as needed (pain).    . pantoprazole (PROTONIX) 40 MG tablet Take 40 mg by mouth daily.    . piperacillin-tazobactam (ZOSYN) 3.375 (3-0.375) g injection     . predniSONE (DELTASONE) 10 MG tablet Take 10 mg by mouth daily with breakfast.    . vancomycin (VANCOCIN) 10 G SOLR injection      No current facility-administered medications for this visit.     ROS: See HPI for pertinent positives and negatives.   Physical Examination  Vitals:   04/28/19 1204  BP: 118/77  Resp: 20  Temp: 97.6 F (36.4 C)  SpO2: 97%  Weight: 162 lb (73.5 kg)  Height: 6' (1.829 m)   Body mass index is 21.97 kg/m.  General: A&O x 3, WDWN, male seated in w/c. HEENT: No gross abnormalities.  Pulmonary: Respirations are  non labored Cardiac: regular rhythm  Radial pulses are palpable bilaterally   Adominal aortic pulse is not palpable                         VASCULAR EXAM: Extremities without ischemic changes, without Gangrene; with open wound at left heel, see photo below. Left 2nd toe amputation site has healed.  Right AKA is well healed.                                                                                                                 LE Pulses Right Left       FEMORAL  not palpable  2+ palpable        POPLITEAL  AKA   not palpable,        POSTERIOR TIBIAL  AKA   not palpable, +brisk Doppler signal         DORSALIS PEDIS      ANTERIOR TIBIAL AKA  2+ palpable    Abdomen: soft, NT, no palpable masses. Skin: no rashes, no cellulitis, no ulcers noted. Musculoskeletal: no muscle wasting or atrophy.  Neurologic: A&O X 3; appropriate affect, Sensation is normal; MOTOR FUNCTION:  moving all extremities equally, motor strength 5/5 throughout. Speech is fluent/normal. CN 2-12 intact. Psychiatric: Thought content is normal, mood appropriate for clinical situation.    ASSESSMENT: Puneet Masoner is a 68 y.o. male who is s/p Left second toe amputation and left heel debridement of skin and soft tissue to 5 x 6 cm on 03-02-19 by Dr. Donzetta Matters for critical left lower extremity ischemia with second toe and heel ulceration.  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 Donzetta Matters for critical  right lower extremity ischemia with severe tissue loss.  His right AKA is well healed. He does not ambulate per sister present; he has had a stroke. He gets around in a w/c.  He is in a rehab center for PICC line antbx for osteomyelitis of his left foot.  Physical therapy per SNF for left leg and upper extremity exercises and strengthening.   Left DP pulse is palpable. Left 2nd toe amputation site has healed. Left heel ulcer is granulating and contracting, continue dressing changes per SNF.   See Plan.     PLAN:  Based on the patient's HPI and examination, pt will return to clinic in 1 month  for left LE arterial duplex and left ABI, see me or Dr. Donzetta Matters or PA.     Clemon Chambers, RN, MSN, FNP-C [facility] Phone: [phone]  Clinic MD: [facility]  04/28/19 12:15 PM

## 2019-04-28 NOTE — Patient Instructions (Signed)
Peripheral Vascular Disease  Peripheral vascular disease (PVD) is a disease of the blood vessels that are not part of your heart and brain. A simple term for PVD is poor circulation. In most cases, PVD narrows the blood vessels that carry blood from your heart to the rest of your body. This can reduce the supply of blood to your arms, legs, and internal organs, like your stomach or kidneys. However, PVD most often affects a person's lower legs and feet. Without treatment, PVD tends to get worse. PVD can also lead to acute ischemic limb. This is when an arm or leg suddenly cannot get enough blood. This is a medical emergency. Follow these instructions at home: Lifestyle  Do not use any products that contain nicotine or tobacco, such as cigarettes and e-cigarettes. If you need help quitting, ask your doctor.  Lose weight if you are overweight. Or, stay at a healthy weight as told by your doctor.  Eat a diet that is low in fat and cholesterol. If you need help, ask your doctor.  Exercise regularly. Ask your doctor for activities that are right for you. General instructions  Take over-the-counter and prescription medicines only as told by your doctor.  Take good care of your feet: ? Wear comfortable shoes that fit well. ? Check your feet often for any cuts or sores.  Keep all follow-up visits as told by your doctor This is important. Contact a doctor if:  You have cramps in your legs when you walk.  You have leg pain when you are at rest.  You have coldness in a leg or foot.  Your skin changes.  You are unable to get or have an erection (erectile dysfunction).  You have cuts or sores on your feet that do not heal. Get help right away if:  Your arm or leg turns cold, numb, and blue.  Your arms or legs become red, warm, swollen, painful, or numb.  You have chest pain.  You have trouble breathing.  You suddenly have weakness in your face, arm, or leg.  You become very  confused or you cannot speak.  You suddenly have a very bad headache.  You suddenly cannot see. Summary  Peripheral vascular disease (PVD) is a disease of the blood vessels.  A simple term for PVD is poor circulation. Without treatment, PVD tends to get worse.  Treatment may include exercise, low fat and low cholesterol diet, and quitting smoking. This information is not intended to replace advice given to you by your health care provider. Make sure you discuss any questions you have with your health care provider. Document Released: 11/27/2009 Document Revised: 08/15/2017 Document Reviewed: 10/10/2016 Elsevier Patient Education  2020 Elsevier Inc.  

## 2019-04-30 NOTE — Patient Instructions (Signed)
Clayton [name]  04/30/2019     @PREFPERIOPPHARMACY @   Your procedure is scheduled on  05/07/2019  Report to [facility] at  [facility]  A.[facility].  Call this number if you have problems the morning of surgery:  [phone]   Remember:  Follow the diet and prep instructions given to you by Dr Olevia Perches office.                       Take these medicines the morning of surgery with A SIP OF WATER  Carvedilol, prilosec, oxycodone(if needed). Take 1/2 of your night time insulin the night before your procedure. DO NOT take any medications for diabetes the morning of your procedure. Use your nebulizer before you come.    Do not wear jewelry, make-up or nail polish.  Do not wear lotions, powders, or perfumes. Please wear deodorant and brush your teeth.  Do not shave 48 hours prior to surgery.  Men may shave face and neck.  Do not bring valuables to the hospital.  [facility] is not responsible for any belongings or valuables.  Contacts, dentures or bridgework may not be worn into surgery.  Leave your suitcase in the car.  After surgery it may be brought to your room.  For patients admitted to the hospital, discharge time will be determined by your treatment team.  Patients discharged the day of surgery will not be allowed to drive home.   Name and phone number of your driver:   family Special instructions:  None  Please read over the following fact sheets that you were given. Anesthesia Post-op Instructions and Care and Recovery After Surgery       Upper Endoscopy, Adult, Care After This sheet gives you information about how to care for yourself after your procedure. Your health care provider may also give you more specific instructions. If you have problems or questions, contact your health care provider. What can I expect after the procedure? After the procedure, it is common to have:  A sore throat.  Mild stomach pain or discomfort.  Bloating.  Nausea. Follow these  instructions at home:   Follow instructions from your health care provider about what to eat or drink after your procedure.  Return to your normal activities as told by your health care provider. Ask your health care provider what activities are safe for you.  Take over-the-counter and prescription medicines only as told by your health care provider.  Do not drive for 24 hours if you were given a sedative during your procedure.  Keep all follow-up visits as told by your health care provider. This is important. Contact a health care provider if you have:  A sore throat that lasts longer than one day.  Trouble swallowing. Get help right away if:  You vomit blood or your vomit looks like coffee grounds.  You have: ? A fever. ? Bloody, black, or tarry stools. ? A severe sore throat or you cannot swallow. ? Difficulty breathing. ? Severe pain in your chest or abdomen. Summary  After the procedure, it is common to have a sore throat, mild stomach discomfort, bloating, and nausea.  Do not drive for 24 hours if you were given a sedative during the procedure.  Follow instructions from your health care provider about what to eat or drink after your procedure.  Return to your normal activities as told by your health care provider. This information is not intended to replace  advice given to you by your health care provider. Make sure you discuss any questions you have with your health care provider. Document Released: 03/03/2012 Document Revised: 02/24/2018 Document Reviewed: 02/02/2018 Elsevier Patient Education  2020 [employer].  Colonoscopy, Adult, Care After This sheet gives you information about how to care for yourself after your procedure. Your health care provider may also give you more specific instructions. If you have problems or questions, contact your health care provider. What can I expect after the procedure? After the procedure, it is common to have:  A small  amount of blood in your stool for 24 hours after the procedure.  Some gas.  Mild abdominal cramping or bloating. Follow these instructions at home: General instructions  For the first 24 hours after the procedure: ? Do not drive or use machinery. ? Do not sign important documents. ? Do not drink alcohol. ? Do your regular daily activities at a slower pace than normal. ? Eat soft, easy-to-digest foods.  Take over-the-counter or prescription medicines only as told by your health care provider. Relieving cramping and bloating   Try walking around when you have cramps or feel bloated.  Apply heat to your abdomen as told by your health care provider. Use a heat source that your health care provider recommends, such as a moist heat pack or a heating pad. ? Place a towel between your skin and the heat source. ? Leave the heat on for 20-30 minutes. ? Remove the heat if your skin turns bright red. This is especially important if you are unable to feel pain, heat, or cold. You may have a greater risk of getting burned. Eating and drinking   Drink enough fluid to keep your urine pale yellow.  Resume your normal diet as instructed by your health care provider. Avoid heavy or fried foods that are hard to digest.  Avoid drinking alcohol for as long as instructed by your health care provider. Contact a health care provider if:  You have blood in your stool 2-3 days after the procedure. Get help right away if:  You have more than a small spotting of blood in your stool.  You pass large blood clots in your stool.  Your abdomen is swollen.  You have nausea or vomiting.  You have a fever.  You have increasing abdominal pain that is not relieved with medicine. Summary  After the procedure, it is common to have a small amount of blood in your stool. You may also have mild abdominal cramping and bloating.  For the first 24 hours after the procedure, do not drive or use machinery, sign  important documents, or drink alcohol.  Contact your health care provider if you have a lot of blood in your stool, nausea or vomiting, a fever, or increased abdominal pain. This information is not intended to replace advice given to you by your health care provider. Make sure you discuss any questions you have with your health care provider. Document Released: 04/16/2004 Document Revised: 06/25/2017 Document Reviewed: 11/14/2015 Elsevier Patient Education  2020 [employer] After These instructions provide you with information about caring for yourself after your procedure. Your health care provider may also give you more specific instructions. Your treatment has been planned according to current medical practices, but problems sometimes occur. Call your health care provider if you have any problems or questions after your procedure. What can I expect after the procedure? After your procedure, you may:  Feel sleepy  for several hours.  Feel clumsy and have poor balance for several hours.  Feel forgetful about what happened after the procedure.  Have poor judgment for several hours.  Feel nauseous or vomit.  Have a sore throat if you had a breathing tube during the procedure. Follow these instructions at home: For at least 24 hours after the procedure:      Have a responsible adult stay with you. It is important to have someone help care for you until you are awake and alert.  Rest as needed.  Do not: ? Participate in activities in which you could fall or become injured. ? Drive. ? Use heavy machinery. ? Drink alcohol. ? Take sleeping pills or medicines that cause drowsiness. ? Make important decisions or sign legal documents. ? Take care of children on your own. Eating and drinking  Follow the diet that is recommended by your health care provider.  If you vomit, drink water, juice, or soup when you can drink without vomiting.  Make sure  you have little or no nausea before eating solid foods. General instructions  Take over-the-counter and prescription medicines only as told by your health care provider.  If you have sleep apnea, surgery and certain medicines can increase your risk for breathing problems. Follow instructions from your health care provider about wearing your sleep device: ? Anytime you are sleeping, including during daytime naps. ? While taking prescription pain medicines, sleeping medicines, or medicines that make you drowsy.  If you smoke, do not smoke without supervision.  Keep all follow-up visits as told by your health care provider. This is important. Contact a health care provider if:  You keep feeling nauseous or you keep vomiting.  You feel light-headed.  You develop a rash.  You have a fever. Get help right away if:  You have trouble breathing. Summary  For several hours after your procedure, you may feel sleepy and have poor judgment.  Have a responsible adult stay with you for at least 24 hours or until you are awake and alert. This information is not intended to replace advice given to you by your health care provider. Make sure you discuss any questions you have with your health care provider. Document Released: 12/24/2015 Document Revised: 12/01/2017 Document Reviewed: 12/24/2015 Elsevier Patient Education  2020 [employer].

## 2019-05-05 ENCOUNTER — Other Ambulatory Visit: Payer: Self-pay

## 2019-05-05 ENCOUNTER — Other Ambulatory Visit (HOSPITAL_COMMUNITY)
Admission: RE | Admit: 2019-05-05 | Discharge: 2019-05-05 | Disposition: A | Discharge status: Home / Self Care | Payer: Medicare Other | Source: Ambulatory Visit | Attending: Internal Medicine | Admitting: Internal Medicine

## 2019-05-05 ENCOUNTER — Encounter (HOSPITAL_COMMUNITY)
Admission: RE | Admit: 2019-05-05 | Discharge: 2019-05-05 | Disposition: A | Discharge status: Home / Self Care | Payer: Medicare Other | Source: Ambulatory Visit | Attending: Internal Medicine | Admitting: Internal Medicine

## 2019-05-05 ENCOUNTER — Encounter (HOSPITAL_COMMUNITY): Payer: Self-pay

## 2019-05-06 LAB — IRON,TIBC AND FERRITIN PANEL
%SAT: 51 % (calc) — ABNORMAL HIGH (ref 20–48)
Ferritin: 39 ng/mL (ref 24–380)
Iron: 159 ug/dL (ref 50–180)
TIBC: 311 mcg/dL (calc) (ref 250–425)

## 2019-05-07 ENCOUNTER — Encounter (HOSPITAL_COMMUNITY): Admission: RE | Payer: Self-pay | Source: Home / Self Care

## 2019-05-07 ENCOUNTER — Ambulatory Visit (HOSPITAL_COMMUNITY): Admission: RE | Admit: 2019-05-07 | Payer: Medicare Other | Source: Home / Self Care | Admitting: Internal Medicine

## 2019-05-07 SURGERY — COLONOSCOPY WITH PROPOFOL
Anesthesia: Monitor Anesthesia Care

## 2019-05-19 ENCOUNTER — Other Ambulatory Visit: Payer: Self-pay

## 2019-05-19 DIAGNOSIS — I779 Disorder of arteries and arterioles, unspecified: Secondary | ICD-10-CM

## 2019-05-25 ENCOUNTER — Ambulatory Visit (INDEPENDENT_AMBULATORY_CARE_PROVIDER_SITE_OTHER)
Admission: RE | Admit: 2019-05-25 | Discharge: 2019-05-25 | Disposition: A | Discharge status: Home / Self Care | Payer: No Typology Code available for payment source | Source: Ambulatory Visit | Attending: Vascular Surgery | Admitting: Vascular Surgery

## 2019-05-25 ENCOUNTER — Ambulatory Visit (HOSPITAL_COMMUNITY)
Admission: RE | Admit: 2019-05-25 | Discharge: 2019-05-25 | Disposition: A | Discharge status: Home / Self Care | Payer: No Typology Code available for payment source | Source: Ambulatory Visit | Attending: Vascular Surgery | Admitting: Vascular Surgery

## 2019-05-25 ENCOUNTER — Ambulatory Visit (INDEPENDENT_AMBULATORY_CARE_PROVIDER_SITE_OTHER): Payer: Medicare Other | Admitting: Physician Assistant

## 2019-05-25 ENCOUNTER — Other Ambulatory Visit: Payer: Self-pay

## 2019-05-25 VITALS — BP 124/78 | HR 64 | Temp 97.6°F | Resp 14 | Ht 72.0 in | Wt 162.0 lb

## 2019-05-25 DIAGNOSIS — L97404 Non-pressure chronic ulcer of unspecified heel and midfoot with necrosis of bone: Secondary | ICD-10-CM

## 2019-05-25 DIAGNOSIS — I779 Disorder of arteries and arterioles, unspecified: Secondary | ICD-10-CM

## 2019-05-25 NOTE — Progress Notes (Signed)
History of Present Illness:  Patient is a 68 y.o. year old male who presents for evaluation status post left lower extremity revascularization with SFA stenting.  He  presented with worsening wounds of L 2nd toe and heel.   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 Donzetta Matters.  He also had endovascular revascularization with left SFA covered stent retrograde approach via posterior tibial artery by Dr. Donzetta Matters 01/21/2019.   He last had Left heel debridement of skin and soft tissue to 5 x 6 cm and amputation of Left second toe amputation.  He has been performing wet to dry daily dressing changes.  Vinnie Level Nickel NP last evaluated him on 04/28/2019.   At that time he advised continue wet-to-dry dressingsto the heel area.  The second toe amputation site was completely healed.  He was scheduled for follow-up in 2 weeks for wound check, ABI's and arterial duplex.    He currently resides in a SNF  at [facility].  He has a PICC line and is receiving IV antibiotics for the heel wound Vanc and Zosyn.  He denise fever and chills.    Pt meds include: Statin :Yes Betablocker:Yes ASA:Yes Other anticoagulants/antiplatelets:Plavix  Past Medical History:  Diagnosis Date  . Acute on chronic respiratory failure with hypoxia ([place])   . Altered mental status   . CAD (coronary artery disease)    a. s/p BMS x 3 to RCA in 2009  . COPD (chronic obstructive pulmonary disease) ([place])   . COPD, severe ([place])   . Diabetes mellitus   . Embolic stroke involving right middle cerebral artery ([place])   . GERD (gastroesophageal reflux disease)   . Guaiac positive stools 07/03/2018  . HTN (hypertension)   . Hyperlipidemia   . Myocardial infarction ([place]) 2009  . Non-STEMI (non-ST elevated myocardial infarction) ([place])   . Prostate cancer ([place])   . Pulmonary arterial hypertension ([place])   . PVD (peripheral vascular disease) ([place])     Past Surgical History:  Procedure Laterality Date  . ABDOMINAL  AORTOGRAM W/LOWER EXTREMITY Left 01/21/2019   Procedure: ABDOMINAL AORTOGRAM W/LOWER EXTREMITY;  Surgeon: Waynetta Sandy, MD;  Location: [place] [facility];  Service: Cardiovascular;  Laterality: Left;  . AMPUTATION Right 12/08/2018   Procedure: AMPUTATION ABOVE KNEE;  Surgeon: Waynetta Sandy, MD;  Location: [place];  Service: Vascular;  Laterality: Right;  . AMPUTATION Left 03/02/2019   Procedure: AMPUTATION DIGIT LEFT SECOND TOE;  Surgeon: Waynetta Sandy, MD;  Location: [place];  Service: Vascular;  Laterality: Left;  . CARDIAC CATHETERIZATION     with stent  . cardiac stents  2009  . CATARACT EXTRACTION W/PHACO Left 08/19/2013   Procedure: CATARACT EXTRACTION PHACO AND INTRAOCULAR LENS PLACEMENT (IOC);  Surgeon: Tonny Branch, MD;  Location: [facility];  Service: Ophthalmology;  Laterality: Left;  CDE:37.77  . CATARACT EXTRACTION W/PHACO Right 07/29/2016   Procedure: CATARACT EXTRACTION PHACO AND INTRAOCULAR LENS PLACEMENT RIGHT EYE; CDE:  18.10;  Surgeon: Tonny Branch, MD;  Location: [facility];  Service: Ophthalmology;  Laterality: Right;  . IR CT HEAD LTD  08/28/2018  . IR PERCUTANEOUS ART THROMBECTOMY/INFUSION INTRACRANIAL INC DIAG ANGIO  08/28/2018  . LOWER EXTREMITY ANGIOGRAPHY Bilateral 12/07/2018   Procedure: LOWER EXTREMITY ANGIOGRAPHY;  Surgeon: Waynetta Sandy, MD;  Location: [place] [facility];  Service: Cardiovascular;  Laterality: Bilateral;  . PERIPHERAL VASCULAR INTERVENTION Left 12/07/2018   Procedure: PERIPHERAL VASCULAR INTERVENTION;  Surgeon: Waynetta Sandy, MD;  Location:  [place] [facility];  Service: Cardiovascular;  Laterality: Left;  . PERIPHERAL VASCULAR INTERVENTION Left 01/21/2019   Procedure: PERIPHERAL VASCULAR INTERVENTION;  Surgeon: Waynetta Sandy, MD;  Location: [place] [facility];  Service: Cardiovascular;  Laterality: Left;  SFA  . RADIOLOGY WITH ANESTHESIA N/A 08/28/2018   Procedure: IR WITH ANESTHESIA;  Surgeon:  Radiologist, Medication, MD;  Location: [place];  Service: Radiology;  Laterality: N/A;  . WOUND DEBRIDEMENT Left 03/02/2019   Procedure: DEBRIDEMENT WOUND LEFT HEEL;  Surgeon: Waynetta Sandy, MD;  Location: [facility];  Service: Vascular;  Laterality: Left;    ROS:   General:  No weight loss, Fever, chills  HEENT: No recent headaches, no nasal bleeding, no visual changes, no sore throat  Neurologic: No dizziness, blackouts, seizures. No recent symptoms of stroke or mini- stroke. No recent episodes of slurred speech, or temporary blindness.  Cardiac: No recent episodes of chest pain/pressure, no shortness of breath at rest.  No shortness of breath with exertion.  Denies history of atrial fibrillation or irregular heartbeat  Vascular: No history of rest pain in feet.  No history of claudication.  positive history of non-healing ulcer, No history of DVT   Pulmonary: No home oxygen, no productive cough, no hemoptysis,  No asthma or wheezing  Musculoskeletal:  _0  Arthritis, _1  Low back pain,  _2  Joint pain  Hematologic:No history of hypercoagulable state.  No history of easy bleeding.  No history of anemia  Gastrointestinal: No hematochezia or melena,  No gastroesophageal reflux, no trouble swallowing  Urinary: _3  chronic Kidney disease, _4  on HD - _5  MWF or _6  TTHS, _7  Burning with urination, _8  Frequent urination, _9  Difficulty urinating;   Skin: No rashes, _10  left heel non healing wound   Psychological: No history of anxiety,  No history of depression  Social History Social History   Tobacco Use  . Smoking status: Former Smoker    Packs/day: 1.00    Years: 42.00    Pack years: 42.00    Types: Cigarettes    Start date: 07/07/1969  . Smokeless tobacco: Never Used  Substance Use Topics  . Alcohol use: Not Currently    Alcohol/week: 0.0 standard drinks    Comment: rarely  . Drug use: No    Family History Family History  Problem Relation Age of Onset  .  Coronary artery disease Mother   . Coronary artery disease Father   . Prostate cancer Father     Allergies  No Known Allergies   Current Outpatient Medications  Medication Sig Dispense Refill  . Acetaminophen 500 MG PACK Take 500 mg by mouth every 6 (six) hours as needed for moderate pain or fever.     . Amino Acids-Protein Hydrolys (FEEDING SUPPLEMENT, PRO-STAT SUGAR FREE 64,) LIQD Take 30 mLs by mouth 2 (two) times daily.    Marland Kitchen aspirin EC 81 MG tablet Take 81 mg by mouth daily.    . bisacodyl (DULCOLAX) 10 MG suppository Place 1 suppository (10 mg total) rectally daily as needed for severe constipation. (Patient not taking: Reported on 04/29/2019) 12 suppository 0  . carvedilol (COREG) 6.25 MG tablet Place 1 tablet (6.25 mg total) into feeding tube 2 (two) times daily with a meal. (Patient taking differently: Take 6.25 mg by mouth 2 (two) times daily with a meal. )    . chlorhexidine gluconate, MEDLINE KIT, (PERIDEX) 0.12 % solution 15 mLs by Mouth Rinse route 2 (two)  times daily. (Patient not taking: Reported on 04/29/2019) 120 mL 0  . clopidogrel (PLAVIX) 75 MG tablet Take 1 tablet (75 mg total) by mouth daily. (Patient not taking: Reported on 04/29/2019) 30 tablet 3  . ferrous sulfate 325 (65 FE) MG tablet Take 325 mg by mouth 3 (three) times daily with meals.    Marland Kitchen glipiZIDE (GLUCOTROL) 10 MG tablet Take 10 mg by mouth 2 (two) times daily before a meal.    . insulin lispro (HUMALOG) 100 UNIT/ML injection Inject 0-10 Units into the skin 3 (three) times daily before meals. 70-100 units= 0 units, 101-150= 1 units, 151-200= 2 units, 201-250= 4 units, 251-300= 6 units, 301-350= 8 units, 351-400= 10 units, greater than 450 = 10 units and call md    . ipratropium-albuterol (DUONEB) 0.5-2.5 (3) MG/3ML SOLN Take 3 mLs by nebulization every 6 (six) hours as needed. (Patient not taking: Reported on 04/29/2019) 360 mL   . metFORMIN (GLUCOPHAGE) 1000 MG tablet Take 1 tablet (1,000 mg total) by mouth 2  (two) times daily with a meal.    . Multiple Vitamin (MULTIVITAMIN WITH MINERALS) TABS tablet Take 1 tablet by mouth daily.    . Nutritional Supplements (ARGINAID PO) Take 1 Scoop by mouth daily.    Marland Kitchen omeprazole (PRILOSEC) 20 MG capsule Take 20 mg by mouth daily.    Marland Kitchen oxyCODONE (OXY IR/ROXICODONE) 5 MG immediate release tablet Take 1 tablet (5 mg total) by mouth every 6 (six) hours as needed for moderate pain. (Patient not taking: Reported on 04/29/2019) 10 tablet 0  . piperacillin-tazobactam (ZOSYN) 3.375 (3-0.375) g injection Inject 3.375 g into the vein every 8 (eight) hours. For 5 weeks    . pravastatin (PRAVACHOL) 20 MG tablet Take 20 mg by mouth at bedtime.    . quinapril (ACCUPRIL) 20 MG tablet Take 20 mg by mouth at bedtime.    . Vancomycin HCl 1250 MG/12.5ML SOLN Inject 1,250 mg into the vein every 12 (twelve) hours.      No current facility-administered medications for this visit.     Physical Examination  Vitals:   05/25/19 1518  BP: 124/78  Pulse: 64  Resp: 14  Temp: 97.6 F (36.4 C)  TempSrc: Temporal  SpO2: 97%  Weight: 162 lb (73.5 kg)  Height: 6' (1.829 m)    Body mass index is 21.97 kg/m.  General:  Alert and oriented, no acute distress HEENT: Normal Pulmonary: Clear to auscultation bilaterally Cardiac: Regular Rate and Rhythm without murmur Abdomen: Soft, non-tender, non-distended, no mass, no scars Skin: No rash Musculoskeletal: No deformity or edema  Neurologic: Upper and lower extremity motor intact      DATA:   +-----------+--------+-----+--------+--------+--------------+ LEFT       PSV cm/sRatioStenosisWaveformComments       +-----------+--------+-----+--------+--------+--------------+ CFA Distal 154                                         +-----------+--------+-----+--------+--------+--------------+ POP Prox   72                   biphasic               +-----------+--------+-----+--------+--------+--------------+ TP  Trunk   91                   biphasic               +-----------+--------+-----+--------+--------+--------------+ ATA  Distal 88                   biphasic               +-----------+--------+-----+--------+--------+--------------+ PTA Distal 88                   biphasic               +-----------+--------+-----+--------+--------+--------------+ PERO Distal                             not visualized +-----------+--------+-----+--------+--------+--------------+    Left Stent(s): +-------------------+--------+---------------+--------+--------+ Superficial femoralPSV cm/sStenosis       WaveformComments +-------------------+--------+---------------+--------+--------+ Prox to Stent      305     50-99% stenosisbiphasicplaque   +-------------------+--------+---------------+--------+--------+ Proximal Stent     117                    biphasic         +-------------------+--------+---------------+--------+--------+ Mid Stent          137                    biphasic         +-------------------+--------+---------------+--------+--------+ Distal Stent       95                     biphasic         +-------------------+--------+---------------+--------+--------+ Distal to Stent    107                    biphasic         +-------------------+--------+---------------+--------+--------+    ABI/TBIToday's ABIToday's TBIPrevious ABIPrevious TBI +-------+-----------+-----------+------------+------------+ Right  AKA                   AKA                      +-------+-----------+-----------+------------+------------+ Left   1.00       0.64       0.78        0.48         +-------+-----------+-----------+------------+------------+   ASSESSMENT/ PLAN:  S/P left SFA stent to improve the inflow and second toe amputation which has healed.  Left heel debridement has not healed and has x ray report that suggest osteomyelitis.  Velocity last visit proximal stent in the SFA was 204 cm/s and now 305 suggesting >50% stenosis.   ABI's and arterial duplex suggest biphasic arterial flow.  I discussed that his blood flow is good, but if the heel wound is not healing he may have to consider left LE amputation.  He does not want to consider this as an option.    I discussed with him and his sister that if he developed a fever and/ or chills he may need to be admitted to the hospital and that would be an even more consideration to press for amputation if the wound made him ill with blood infection.  He stated he understood.    We schedule him a f/u appt for 4 weeks from now to see Dr. Donzetta Matters.     Roxy Horseman PA-C [facility] Office: [phone]  MD in office DR. Carlis Abbott

## 2019-06-25 ENCOUNTER — Encounter: Payer: Self-pay | Admitting: Vascular Surgery

## 2019-06-25 ENCOUNTER — Other Ambulatory Visit: Payer: Self-pay

## 2019-06-25 ENCOUNTER — Ambulatory Visit (INDEPENDENT_AMBULATORY_CARE_PROVIDER_SITE_OTHER): Payer: Medicare Other | Admitting: Vascular Surgery

## 2019-06-25 VITALS — BP 123/72 | HR 83 | Temp 97.8°F | Resp 20 | Ht 72.0 in | Wt 162.0 lb

## 2019-06-25 DIAGNOSIS — I779 Disorder of arteries and arterioles, unspecified: Secondary | ICD-10-CM

## 2019-06-25 DIAGNOSIS — L97404 Non-pressure chronic ulcer of unspecified heel and midfoot with necrosis of bone: Secondary | ICD-10-CM | POA: Diagnosis not present

## 2019-06-25 NOTE — Progress Notes (Signed)
Patient ID: Clayton [name], male   DOB: 02/09/1951, 68 y.o.   MRN: [id number]  Reason for Consult: Follow-up   Referred by Bridget Hartshorn, NP  Subjective:     HPI:  Clayton [name] is a 69 y.o. male status post right lower extremity amputation left lower extremity retrograde intervention of his SFA.  This was done for heel ulcer also had a second toe amputation.  His amputation site of the toe healing well.  Heel ulcer is improving.  He is currently in a nursing home he is wheelchair-bound using his left leg for transfer overall is doing well has been improving gaining weight.  He does take aspirin and Plavix as well as pravastatin.  Past Medical History:  Diagnosis Date  . Acute on chronic respiratory failure with hypoxia ([place])   . Altered mental status   . CAD (coronary artery disease)    a. s/p BMS x 3 to RCA in 2009  . COPD (chronic obstructive pulmonary disease) ([place])   . COPD, severe ([place])   . Diabetes mellitus   . Embolic stroke involving right middle cerebral artery ([place])   . GERD (gastroesophageal reflux disease)   . Guaiac positive stools 07/03/2018  . HTN (hypertension)   . Hyperlipidemia   . Myocardial infarction ([place]) 2009  . Non-STEMI (non-ST elevated myocardial infarction) ([place])   . Prostate cancer ([place])   . Pulmonary arterial hypertension ([place])   . PVD (peripheral vascular disease) ([facility])    Family History  Problem Relation Age of Onset  . Coronary artery disease Mother   . Coronary artery disease Father   . Prostate cancer Father    Past Surgical History:  Procedure Laterality Date  . ABDOMINAL AORTOGRAM W/LOWER EXTREMITY Left 01/21/2019   Procedure: ABDOMINAL AORTOGRAM W/LOWER EXTREMITY;  Surgeon: Waynetta Sandy, MD;  Location: [facility];  Service: Cardiovascular;  Laterality: Left;  . AMPUTATION Right 12/08/2018   Procedure: AMPUTATION ABOVE KNEE;  Surgeon: Waynetta Sandy, MD;  Location: [facility];  Service: Vascular;   Laterality: Right;  . AMPUTATION Left 03/02/2019   Procedure: AMPUTATION DIGIT LEFT SECOND TOE;  Surgeon: Waynetta Sandy, MD;  Location: [facility];  Service: Vascular;  Laterality: Left;  . CARDIAC CATHETERIZATION     with stent  . cardiac stents  2009  . CATARACT EXTRACTION W/PHACO Left 08/19/2013   Procedure: CATARACT EXTRACTION PHACO AND INTRAOCULAR LENS PLACEMENT (IOC);  Surgeon: Tonny Branch, MD;  Location: [facility];  Service: Ophthalmology;  Laterality: Left;  CDE:37.77  . CATARACT EXTRACTION W/PHACO Right 07/29/2016   Procedure: CATARACT EXTRACTION PHACO AND INTRAOCULAR LENS PLACEMENT RIGHT EYE; CDE:  18.10;  Surgeon: Tonny Branch, MD;  Location: [facility];  Service: Ophthalmology;  Laterality: Right;  . IR CT HEAD LTD  08/28/2018  . IR PERCUTANEOUS ART THROMBECTOMY/INFUSION INTRACRANIAL INC DIAG ANGIO  08/28/2018  . LOWER EXTREMITY ANGIOGRAPHY Bilateral 12/07/2018   Procedure: LOWER EXTREMITY ANGIOGRAPHY;  Surgeon: Waynetta Sandy, MD;  Location: [facility];  Service: Cardiovascular;  Laterality: Bilateral;  . PERIPHERAL VASCULAR INTERVENTION Left 12/07/2018   Procedure: PERIPHERAL VASCULAR INTERVENTION;  Surgeon: Waynetta Sandy, MD;  Location: [facility];  Service: Cardiovascular;  Laterality: Left;  . PERIPHERAL VASCULAR INTERVENTION Left 01/21/2019   Procedure: PERIPHERAL VASCULAR INTERVENTION;  Surgeon: Waynetta Sandy, MD;  Location: [facility];  Service: Cardiovascular;  Laterality: Left;  SFA  . RADIOLOGY WITH ANESTHESIA N/A 08/28/2018   Procedure: IR WITH ANESTHESIA;  Surgeon:  Radiologist, Medication, MD;  Location: [facility];  Service: Radiology;  Laterality: N/A;  . WOUND DEBRIDEMENT Left 03/02/2019   Procedure: DEBRIDEMENT WOUND LEFT HEEL;  Surgeon: Waynetta Sandy, MD;  Location: [facility];  Service: Vascular;  Laterality: Left;    Short Social History:  Social History   Tobacco Use  . Smoking status: Former Smoker     Packs/day: 1.00    Years: 42.00    Pack years: 42.00    Types: Cigarettes    Start date: 07/07/1969  . Smokeless tobacco: Never Used  Substance Use Topics  . Alcohol use: Not Currently    Alcohol/week: 0.0 standard drinks    Comment: rarely    No Known Allergies  Current Outpatient Medications  Medication Sig Dispense Refill  . Acetaminophen 500 MG PACK Take 500 mg by mouth every 6 (six) hours as needed for moderate pain or fever.     . Amino Acids-Protein Hydrolys (FEEDING SUPPLEMENT, PRO-STAT SUGAR FREE 64,) LIQD Take 30 mLs by mouth 2 (two) times daily.    Marland Kitchen aspirin EC 81 MG tablet Take 81 mg by mouth daily.    . bisacodyl (DULCOLAX) 10 MG suppository Place 1 suppository (10 mg total) rectally daily as needed for severe constipation. 12 suppository 0  . carvedilol (COREG) 6.25 MG tablet Place 1 tablet (6.25 mg total) into feeding tube 2 (two) times daily with a meal. (Patient taking differently: Take 6.25 mg by mouth 2 (two) times daily with a meal. )    . chlorhexidine gluconate, MEDLINE KIT, (PERIDEX) 0.12 % solution 15 mLs by Mouth Rinse route 2 (two) times daily. 120 mL 0  . ferrous sulfate 325 (65 FE) MG tablet Take 325 mg by mouth 3 (three) times daily with meals.    Marland Kitchen glipiZIDE (GLUCOTROL) 10 MG tablet Take 10 mg by mouth 2 (two) times daily before a meal.    . insulin lispro (HUMALOG) 100 UNIT/ML injection Inject 0-10 Units into the skin 3 (three) times daily before meals. 70-100 units= 0 units, 101-150= 1 units, 151-200= 2 units, 201-250= 4 units, 251-300= 6 units, 301-350= 8 units, 351-400= 10 units, greater than 450 = 10 units and call md    . ipratropium-albuterol (DUONEB) 0.5-2.5 (3) MG/3ML SOLN Take 3 mLs by nebulization every 6 (six) hours as needed. 360 mL   . metFORMIN (GLUCOPHAGE) 1000 MG tablet Take 1 tablet (1,000 mg total) by mouth 2 (two) times daily with a meal.    . Multiple Vitamin (MULTIVITAMIN WITH MINERALS) TABS tablet Take 1 tablet by mouth daily.    .  Nutritional Supplements (ARGINAID PO) Take 1 Scoop by mouth daily.    Marland Kitchen omeprazole (PRILOSEC) 20 MG capsule Take 20 mg by mouth daily.    Marland Kitchen oxyCODONE (OXY IR/ROXICODONE) 5 MG immediate release tablet Take 1 tablet (5 mg total) by mouth every 6 (six) hours as needed for moderate pain. 10 tablet 0  . piperacillin-tazobactam (ZOSYN) 3.375 (3-0.375) g injection Inject 3.375 g into the vein every 8 (eight) hours. For 5 weeks    . pravastatin (PRAVACHOL) 20 MG tablet Take 20 mg by mouth at bedtime.    . quinapril (ACCUPRIL) 20 MG tablet Take 20 mg by mouth at bedtime.    . clopidogrel (PLAVIX) 75 MG tablet Take 1 tablet (75 mg total) by mouth daily. (Patient not taking: Reported on 06/25/2019) 30 tablet 3  . Vancomycin HCl 1250 MG/12.5ML SOLN Inject 1,250 mg into the vein every 12 (twelve) hours.  No current facility-administered medications for this visit.     Review of Systems  Constitutional:  Constitutional negative. HENT: HENT negative.  Eyes: Eyes negative.  Respiratory: Respiratory negative.  Cardiovascular: Cardiovascular negative.  GI: Gastrointestinal negative.  Musculoskeletal: Musculoskeletal negative.  Skin: Positive for wound.  Neurological: Neurological negative. Hematologic: Hematologic/lymphatic negative.  Psychiatric: Psychiatric negative.        Objective:  Objective   Vitals:   06/25/19 1112  BP: 123/72  Pulse: 83  Resp: 20  Temp: 97.8 F (36.6 C)  SpO2: 97%  Weight: 162 lb (73.5 kg)  Height: 6' (1.829 m)   Body mass index is 21.97 kg/m.  Physical Exam HENT:     Head: Normocephalic.  Cardiovascular:     Rate and Rhythm: Normal rate.     Pulses:          Popliteal pulses are 0 on the left side.     Comments: Right amputation Left strong posterior tibial and dorsalis pedis signals can be traced all the way to the left great toe Abdominal:     Palpations: Abdomen is soft.  Musculoskeletal:        General: No swelling.  Skin:    General: Skin is  warm and dry.     Capillary Refill: Capillary refill takes less than 2 seconds.  Neurological:     General: No focal deficit present.     Mental Status: He is alert.    Psychiatric:        Mood and Affect: Mood normal.        Behavior: Behavior normal.        Thought Content: Thought content normal.        Judgment: Judgment normal.     Data: No studies related to today's visit     Assessment/Plan:     68 year old male status post right-sided amputation left lower extremity revascularization for heel ulcer and also second toe amputation.  The heel ulcer is coming along actually shows evidence of healing.  Foot is very well perfused.  He needs to protect his foot.  Continue aspirin Plavix and statin.  He will follow-up in a few months with left lower extremity duplex and ABIs.     Waynetta Sandy MD [facility]

## 2019-07-21 ENCOUNTER — Telehealth: Payer: Medicare Other | Admitting: Cardiology

## 2019-07-21 NOTE — Progress Notes (Unsigned)
{Choose 1 Note Type (Telehealth Visit or Telephone Visit):[phone]}   Date:  07/21/2019   ID:  Clayton [name], DOB 06/17/1951, MRN [id number]  {Patient Location:[phone]::"Home"} {Provider Location:[phone]::"Home"}  PCP:  Bridget Hartshorn, NP  Cardiologist:  Carlyle Dolly, MD *** Electrophysiologist:  None   Evaluation Performed:  {Choose Visit [id number] Visit"}  Chief Complaint:  ***  History of Present Illness:    Clayton [name] is a 68 y.o. male seen today for follow up of the following medical problems.    1. CAD - hx of PCI in 2009. 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 LVEF at that time by LVgram 55%.  - 05/2015 echo LVEF 55%, mild AI   - no recent chest pain. Occasional SOB at times. 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   2. HTN - mixed compliance with meds  3. COPD - noted on PFTs 11/2015, he was referred to Dr Luan Pulling at that time  - mixed compliance with inhalers  4. PAD - followed by vascular, history of left SFA stenting. Presented with foot wound, s/p right AKA 11/2018  The patient {does/does not:200015} have symptoms concerning for COVID-19 infection (fever, chills, cough, or new shortness of breath).    Past Medical History:  Diagnosis Date  . Acute on chronic respiratory failure with hypoxia ([place])   . Altered mental status   . CAD (coronary artery disease)    a. s/p BMS x 3 to RCA in 2009  . COPD (chronic obstructive pulmonary disease) ([place])   . COPD, severe ([place])   . Diabetes mellitus   . Embolic stroke involving right middle cerebral artery ([place])   . GERD (gastroesophageal reflux disease)   . Guaiac positive stools 07/03/2018  . HTN (hypertension)   . Hyperlipidemia   . Myocardial infarction ([place]) 2009  . Non-STEMI (non-ST elevated myocardial  infarction) ([place])   . Prostate cancer ([place])   . Pulmonary arterial hypertension ([place])   . PVD (peripheral vascular disease) ([place])    Past Surgical History:  Procedure Laterality Date  . ABDOMINAL AORTOGRAM W/LOWER EXTREMITY Left 01/21/2019   Procedure: ABDOMINAL AORTOGRAM W/LOWER EXTREMITY;  Surgeon: Waynetta Sandy, MD;  Location: [facility];  Service: Cardiovascular;  Laterality: Left;  . AMPUTATION Right 12/08/2018   Procedure: AMPUTATION ABOVE KNEE;  Surgeon: Waynetta Sandy, MD;  Location: [facility];  Service: Vascular;  Laterality: Right;  . AMPUTATION Left 03/02/2019   Procedure: AMPUTATION DIGIT LEFT SECOND TOE;  Surgeon: Waynetta Sandy, MD;  Location: [facility];  Service: Vascular;  Laterality: Left;  . CARDIAC CATHETERIZATION     with stent  . cardiac stents  2009  . CATARACT EXTRACTION W/PHACO Left 08/19/2013   Procedure: CATARACT EXTRACTION PHACO AND INTRAOCULAR LENS PLACEMENT (IOC);  Surgeon: Tonny Branch, MD;  Location: [facility];  Service: Ophthalmology;  Laterality: Left;  CDE:37.77  . CATARACT EXTRACTION W/PHACO Right 07/29/2016   Procedure: CATARACT EXTRACTION PHACO AND INTRAOCULAR LENS PLACEMENT RIGHT EYE; CDE:  18.10;  Surgeon: Tonny Branch, MD;  Location: [facility];  Service: Ophthalmology;  Laterality: Right;  . IR CT HEAD LTD  08/28/2018  . IR PERCUTANEOUS ART THROMBECTOMY/INFUSION INTRACRANIAL INC DIAG ANGIO  08/28/2018  . LOWER EXTREMITY ANGIOGRAPHY Bilateral 12/07/2018   Procedure: LOWER EXTREMITY ANGIOGRAPHY;  Surgeon: Waynetta Sandy, MD;  Location: [facility];  Service: Cardiovascular;  Laterality: Bilateral;  . PERIPHERAL VASCULAR INTERVENTION Left 12/07/2018   Procedure: PERIPHERAL VASCULAR INTERVENTION;  Surgeon: Waynetta Sandy, MD;  Location: [facility];  Service: Cardiovascular;  Laterality: Left;  . PERIPHERAL VASCULAR INTERVENTION Left 01/21/2019   Procedure: PERIPHERAL VASCULAR INTERVENTION;  Surgeon:  Waynetta Sandy, MD;  Location: [facility];  Service: Cardiovascular;  Laterality: Left;  SFA  . RADIOLOGY WITH ANESTHESIA N/A 08/28/2018   Procedure: IR WITH ANESTHESIA;  Surgeon: Radiologist, Medication, MD;  Location: [facility];  Service: Radiology;  Laterality: N/A;  . WOUND DEBRIDEMENT Left 03/02/2019   Procedure: DEBRIDEMENT WOUND LEFT HEEL;  Surgeon: Waynetta Sandy, MD;  Location: [facility];  Service: Vascular;  Laterality: Left;     No outpatient medications have been marked as taking for the 07/21/19 encounter (Appointment) with Arnoldo Lenis, MD.     Allergies:   Patient has no known allergies.   Social History   Tobacco Use  . Smoking status: Former Smoker    Packs/day: 1.00    Years: 42.00    Pack years: 42.00    Types: Cigarettes    Start date: 07/07/1969  . Smokeless tobacco: Never Used  Substance Use Topics  . Alcohol use: Not Currently    Alcohol/week: 0.0 standard drinks    Comment: rarely  . Drug use: No     Family Hx: The patient's family history includes Coronary artery disease in his father and mother; Prostate cancer in his father.  ROS:   Please see the history of present illness.    *** All other systems reviewed and are negative.   Prior CV studies:   The following studies were reviewed today:  11/2007 Cath HEMODYNAMIC RESULTS: Aorta 95/83 mmHg. Left ventricle 94/22 mmHg.  ANGIOGRAPHIC FINDINGS: 1. Left main coronary artery is free of significant flow-limiting  coronary atherosclerosis and gives rise to the left anterior  descending and circumflex vessels. 2. Left anterior descending is a medium-caliber vessel that extends  down to the apex. There are 3 diagonal branches. Within the  proximal portion of the vessel distal to the first diagonal branch  is an area of 40% to 50% stenosis followed by an area of 30%  stenosis in the midvessel. In the distal vessel there is an area  of  30% diffuse stenosis. Flow was TIMI-3 in this vessel. 3. The circumflex vessel is medium in caliber. There are 4 obtuse  marginal branches. The first branch is the largest. Within this  branch is an area of relatively focal 60% to 70% stenosis  surrounded by an area of approximately 50% to 60% stenosis in  diffuse fashion. Distal to this is an area of 50% more focal  stenosis. 4. Otherwise, there are relatively mild luminal irregularities to  approximately 20% in the circumflex vessel and an area of 30%  stenosis within the fourth obtuse marginal branch. 5. Right coronary artery is occluded in the proximal to midvessel  segment. There are faint left-to-right collaterals that fill a  portion of the distal vessel, although the remainder of the vessel  is not well seen.  LEFT VENTRICULOGRAPHY: Performed in the RAO projection and reveals an ejection fraction of approximately 55% with mid to basal inferior akinesis and trace mitral regurgitation.  DIAGNOSES: 1. Coronary atherosclerosis as outlined including an occluded proximal  to mid right coronary artery associated with faint left-to-right  collateral filling of the distal vessel. There is otherwise  moderate left system disease including 60% to 70%  stenosis  involving the obtuse marginal and 40% to 50% stenosis in the  proximal left anterior descending. 2. Left ventricular ejection fraction of approximately 55% with mid to  basal inferior akinesis, trace mitral regurgitation and left  ventricular end-diastolic pressure of 22 mmHg.  DISCUSSION: I reviewed the results with the patient and also discussed the films with Dr. Lia Foyer. At this point, I anticipate percutaneous intervention to treat the right coronary artery and otherwise medical therapy.   05/2015 echo  Study Conclusions  - Left ventricle: Technically dificiult study. There  is septal dyssynergy related to IVCD. The cavity size was mildly dilated. Wall thickness was increased in a pattern of mild LVH. The estimated ejection fraction was 55%. - Aortic valve: Sclerosis without stenosis. There was mild regurgitation. - Right ventricle: The cavity size was normal. Systolic function was normal.  11/2015 PFTs Severe COPD  Labs/Other Tests and Data Reviewed:    EKG:  {EKG/Telemetry Strips Reviewed:([phone]}  Recent Labs: 08/24/2018: B Natriuretic Peptide 678.0 09/06/2018: TSH 1.377 12/10/2018: Magnesium 1.8 03/19/2019: ALT 10; BUN 8; Creatinine, Ser 0.66; Hemoglobin 7.8; Platelets 331; Potassium 4.0; Sodium 140   Recent Lipid Panel Lab Results  Component Value Date/Time   CHOL 112 08/28/2018 10:02 AM   TRIG 31 09/03/2018 05:45 AM   HDL 32 (L) 08/28/2018 10:02 AM   CHOLHDL 3.5 08/28/2018 10:02 AM   LDLCALC 65 08/28/2018 10:02 AM    Wt Readings from Last 3 Encounters:  06/25/19 162 lb (73.5 kg)  05/25/19 162 lb (73.5 kg)  04/28/19 162 lb (73.5 kg)     Objective:    Vital Signs:  There were no vitals taken for this visit.   {[facility] Virtual Exam (Optional):[phone]::"VITAL SIGNS:  reviewed"}  ASSESSMENT & PLAN:    1. CAD - no recent chest pain, we will continue current meds  2. HTN - at goal, continue current meds  3. COPD - encouraged increased compliance with inhalers    COVID-19 Education: The signs and symptoms of COVID-19 were discussed with the patient and how to seek care for testing (follow up with PCP or arrange E-visit).  ***The importance of social distancing was discussed today.  Time:   Today, I have spent *** minutes with the patient with telehealth technology discussing the above problems.     Medication Adjustments/Labs and Tests Ordered: Current medicines are reviewed at length with the patient today.  Concerns regarding medicines are outlined above.   Tests Ordered: No orders of the defined types  were placed in this encounter.   Medication Changes: No orders of the defined types were placed in this encounter.   Follow Up:  {F/U Format:[phone]} {follow up:15908}  Signed, Carlyle Dolly, MD  07/21/2019 9:21 AM    [place]

## 2019-08-03 ENCOUNTER — Telehealth (INDEPENDENT_AMBULATORY_CARE_PROVIDER_SITE_OTHER): Payer: Medicare Other | Admitting: Cardiology

## 2019-08-03 ENCOUNTER — Encounter: Payer: Self-pay | Admitting: *Deleted

## 2019-08-03 VITALS — BP 115/72 | HR 77 | Temp 97.4°F | Resp 20 | Ht 74.0 in | Wt 148.0 lb

## 2019-08-03 DIAGNOSIS — I2510 Atherosclerotic heart disease of native coronary artery without angina pectoris: Secondary | ICD-10-CM | POA: Diagnosis not present

## 2019-08-03 DIAGNOSIS — I10 Essential (primary) hypertension: Secondary | ICD-10-CM

## 2019-08-03 NOTE — Progress Notes (Signed)
Virtual Visit via Telephone Note   This visit type was conducted due to national recommendations for restrictions regarding the COVID-19 Pandemic (e.g. social distancing) in an effort to limit this patient's exposure and mitigate transmission in our community.  Due to his co-morbid illnesses, this patient is at least at moderate risk for complications without adequate follow up.  This format is felt to be most appropriate for this patient at this time.  The patient did not have access to video technology/had technical difficulties with video requiring transitioning to audio format only (telephone).  All issues noted in this document were discussed and addressed.  No physical exam could be performed with this format.  Please refer to the patient's chart for his  consent to telehealth for [facility].   Date:  08/03/2019   ID:  Clayton [name], DOB [birth year], MRN [id number]  Patient Location: Home Provider Location: Home  PCP:  Bridget Hartshorn, NP  Cardiologist:  Carlyle Dolly, MD  Electrophysiologist:  None   Evaluation Performed:  Follow-Up Visit  Chief Complaint:  Follow up  History of Present Illness:    Clayton [name] is a 68 y.o. male seen today for follow up of the following medical problems.    1. CAD - hx of PCI in 2009. 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 LVEF at that time by LVgram 55%.  - 05/2015 echo LVEF 55%, mild AI   - no recent chest pain. No SOB or DOE - compliant with meds  2. HTN -compliant withmeds  3. COPD - noted on PFTs 11/2015, he was referred to Dr Luan Pulling at that time     4. PAD - s/pleft second toe amputationand left heel debridement of skin and soft tissue to 5 x 6 cmon 03-02-19 by Dr. 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 Donzetta Matters for critical right lower extremity ischemia with severe tissue loss. -  currently at [facility]    The patient does not have symptoms concerning for COVID-19 infection (fever, chills, cough, or new shortness of breath).    Past Medical History:  Diagnosis Date  . Acute on chronic respiratory failure with hypoxia ([place])   . Altered mental status   . CAD (coronary artery disease)    a. s/p BMS x 3 to RCA in 2009  . COPD (chronic obstructive pulmonary disease) ([place])   . COPD, severe ([place])   . Diabetes mellitus   . Embolic stroke involving right middle cerebral artery ([place])   . GERD (gastroesophageal reflux disease)   . Guaiac positive stools 07/03/2018  . HTN (hypertension)   . Hyperlipidemia   . Myocardial infarction ([place]) 2009  . Non-STEMI (non-ST elevated myocardial infarction) ([place])   . Prostate cancer ([place])   . Pulmonary arterial hypertension ([place])   . PVD (peripheral vascular disease) ([place])    Past Surgical History:  Procedure Laterality Date  . ABDOMINAL AORTOGRAM W/LOWER EXTREMITY Left 01/21/2019   Procedure: ABDOMINAL AORTOGRAM W/LOWER EXTREMITY;  Surgeon: Waynetta Sandy, MD;  Location: [place] [facility];  Service: Cardiovascular;  Laterality: Left;  . AMPUTATION Right 12/08/2018   Procedure: AMPUTATION ABOVE KNEE;  Surgeon: Waynetta Sandy, MD;  Location: [place];  Service: Vascular;  Laterality: Right;  . AMPUTATION Left 03/02/2019   Procedure: AMPUTATION DIGIT LEFT SECOND TOE;  Surgeon: Waynetta Sandy, MD;  Location: [place];  Service: Vascular;  Laterality: Left;  . CARDIAC CATHETERIZATION     with stent  . cardiac stents  2009  . CATARACT EXTRACTION W/PHACO Left 08/19/2013   Procedure: CATARACT EXTRACTION PHACO AND INTRAOCULAR LENS PLACEMENT (IOC);  Surgeon: Tonny , MD;  [facility]: [facility];  Service: Ophthalmology;  Laterality: Left;  CDE:37.77  . CATARACT EXTRACTION W/PHACO Right 07/29/2016   Procedure: CATARACT EXTRACTION PHACO AND INTRAOCULAR LENS PLACEMENT RIGHT EYE; CDE:  18.10;  Surgeon: Tonny , MD;   [facility]: [facility];  Service: Ophthalmology;  Laterality: Right;  . IR CT HEAD LTD  08/28/2018  . IR PERCUTANEOUS ART THROMBECTOMY/INFUSION INTRACRANIAL INC DIAG ANGIO  08/28/2018  . LOWER EXTREMITY ANGIOGRAPHY Bilateral 12/07/2018   Procedure: LOWER EXTREMITY ANGIOGRAPHY;  Surgeon: Waynetta Sandy, MD;  Location: [place] [facility];  Service: Cardiovascular;  Laterality: Bilateral;  . PERIPHERAL VASCULAR INTERVENTION Left 12/07/2018   Procedure: PERIPHERAL VASCULAR INTERVENTION;  Surgeon: Waynetta Sandy, MD;  Location: [place] [facility];  Service: Cardiovascular;  Laterality: Left;  . PERIPHERAL VASCULAR INTERVENTION Left 01/21/2019   Procedure: PERIPHERAL VASCULAR INTERVENTION;  Surgeon: Waynetta Sandy, MD;  Location: [place] [facility];  Service: Cardiovascular;  Laterality: Left;  SFA  . RADIOLOGY WITH ANESTHESIA N/A 08/28/2018   Procedure: IR WITH ANESTHESIA;  Surgeon: Radiologist, Medication, MD;  Location: [place];  Service: Radiology;  Laterality: N/A;  . WOUND DEBRIDEMENT Left 03/02/2019   Procedure: DEBRIDEMENT WOUND LEFT HEEL;  Surgeon: Waynetta Sandy, MD;  Location: [place];  Service: Vascular;  Laterality: Left;     No outpatient medications have been marked as taking for the 08/03/19 encounter (Appointment) with Arnoldo Lenis, MD.     Allergies:   Patient has no known allergies.   Social History   Tobacco Use  . Smoking status: Former Smoker    Packs/day: 1.00    Years: 42.00    Pack years: 42.00    Types: Cigarettes    Start date: 07/07/1969  . Smokeless tobacco: Never Used  Substance Use Topics  . Alcohol use: Not Currently    Alcohol/week: 0.0 standard drinks    Comment: rarely  . Drug use: No     Family Hx: The patient's family history includes Coronary artery disease in his father and mother; Prostate cancer in his father.  ROS:   Please see the history of present illness.     All other systems reviewed and are  negative.   Prior CV studies:   The following studies were reviewed today:  11/2007 Cath HEMODYNAMIC RESULTS: Aorta 95/83 mmHg. Left ventricle 94/22 mmHg.  ANGIOGRAPHIC FINDINGS: 1. Left main coronary artery is free of significant flow-limiting  coronary atherosclerosis and gives rise to the left anterior  descending and circumflex vessels. 2. Left anterior descending is a medium-caliber vessel that extends  down to the apex. There are 3 diagonal es. Within the  proximal portion of the vessel distal to the first diagonal   is an area of 40% to 50% stenosis followed by an area of 30%  stenosis in the midvessel. In the distal vessel there is an area  of 30% diffuse stenosis. Flow was TIMI-3 in this vessel. 3. The circumflex vessel is medium in caliber. There are 4 obtuse  marginal es. The first  is the largest. Within this   is an area of relatively focal 60% to 70% stenosis  surrounded by an area of approximately 50% to 60% stenosis in  diffuse fashion. Distal to this  is an area of 50% more focal  stenosis. 4. Otherwise, there are relatively mild luminal irregularities to  approximately 20% in the circumflex vessel and an area of 30%  stenosis within the fourth obtuse marginal . 5. Right coronary artery is occluded in the proximal to midvessel  segment. There are faint left-to-right collaterals that fill a  portion of the distal vessel, although the remainder of the vessel  is not well seen.  LEFT VENTRICULOGRAPHY: Performed in the RAO projection and reveals an ejection fraction of approximately 55% with mid to basal inferior akinesis and trace mitral regurgitation.  DIAGNOSES: 1. Coronary atherosclerosis as outlined including an occluded proximal  to mid right coronary artery associated with faint left-to-right  collateral filling of the  distal vessel. There is otherwise  moderate left system disease including 60% to 70% stenosis  involving the obtuse marginal and 40% to 50% stenosis in the  proximal left anterior descending. 2. Left ventricular ejection fraction of approximately 55% with mid to  basal inferior akinesis, trace mitral regurgitation and left  ventricular end-diastolic pressure of 22 mmHg.  DISCUSSION: I reviewed the results with the patient and also discussed the films with Dr. Lia Foyer. At this point, I anticipate percutaneous intervention to treat the right coronary artery and otherwise medical therapy.   05/2015 echo  Study Conclusions  - Left ventricle: Technically dificiult study. 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 The cavity size was mildly dilated. Wall thickness was increased in a pattern of mild LVH. The estimated ejection fraction was 55%. - Aortic valve: Sclerosis without stenosis. There was mild regurgitation. - Right ventricle: The cavity size was normal. Systolic function was normal.  11/2015 PFTs Severe COPD  Labs/Other Tests and Data Reviewed:    EKG:  No ECG reviewed.  Recent Labs: 08/24/2018: B Natriuretic Peptide 678.0 09/06/2018: TSH 1.377 12/10/2018: Magnesium 1.8 03/19/2019: ALT 10; BUN 8; Creatinine, Ser 0.66; Hemoglobin 7.8; Platelets 331; Potassium 4.0; Sodium 140   Recent Lipid Panel Lab Results  Component Value Date/Time   CHOL 112 08/28/2018 10:02 AM   TRIG 31 09/03/2018 05:45 AM   HDL 32 (L) 08/28/2018 10:02 AM   CHOLHDL 3.5 08/28/2018 10:02 AM   LDLCALC 65 08/28/2018 10:02 AM    Wt Readings from Last 3 Encounters:  06/25/19 162 lb (73.5 kg)  05/25/19 162 lb (73.5 kg)  04/28/19 162 lb (73.5 kg)     Objective:    Vital Signs:   Today's Vitals   08/03/19 0843  BP: 115/72  Pulse: 77  Resp: 20  Temp: (!) 97.4 F (36.3 C)  SpO2: 95%  Weight: 148 lb (67.1 kg)  Height: 6\' 2"  (1.88 m)   Body mass  index is 19 kg/m. Normal affect. Normal speech pattern and tone. Comfortable, no apparent distress. No audible signs of SOB or wheezing.   ASSESSMENT & PLAN:    1. CAD - doing well without symptoms, conitnue current meds  2. HTN - bp is at goal, continue current meds      F/u 68months  COVID-19 Education: The signs and symptoms of COVID-19 were discussed with the patient and how to seek care for testing (follow up with PCP or arrange E-visit).  The importance of social distancing was discussed today.  Time:   Today, I have spent 14 minutes with the patient with telehealth technology discussing the above problems.     Medication Adjustments/Labs and Tests Ordered: Current medicines are reviewed at length with the patient today.  Concerns  regarding medicines are outlined above.   Tests Ordered: No orders of the defined types were placed in this encounter.   Medication Changes: No orders of the defined types were placed in this encounter.   Follow Up:  In Person in 6 month(s)  Signed, Carlyle Dolly, MD  08/03/2019 7:46 AM    [place]

## 2019-08-03 NOTE — Patient Instructions (Signed)

## 2019-10-01 ENCOUNTER — Ambulatory Visit: Payer: Medicare Other | Admitting: Family

## 2019-10-01 ENCOUNTER — Encounter (HOSPITAL_COMMUNITY): Payer: Medicare Other

## 2019-10-28 ENCOUNTER — Other Ambulatory Visit: Payer: Self-pay

## 2019-10-28 ENCOUNTER — Telehealth (HOSPITAL_COMMUNITY): Payer: Self-pay

## 2019-10-28 DIAGNOSIS — I779 Disorder of arteries and arterioles, unspecified: Secondary | ICD-10-CM

## 2019-10-28 NOTE — Telephone Encounter (Signed)

## 2019-10-29 ENCOUNTER — Ambulatory Visit (INDEPENDENT_AMBULATORY_CARE_PROVIDER_SITE_OTHER)
Admission: RE | Admit: 2019-10-29 | Discharge: 2019-10-29 | Disposition: A | Discharge status: Home / Self Care | Payer: Medicare Other | Source: Ambulatory Visit | Attending: Vascular Surgery | Admitting: Vascular Surgery

## 2019-10-29 ENCOUNTER — Ambulatory Visit (HOSPITAL_COMMUNITY)
Admission: RE | Admit: 2019-10-29 | Discharge: 2019-10-29 | Disposition: A | Discharge status: Home / Self Care | Payer: Medicare Other | Source: Ambulatory Visit | Attending: Vascular Surgery | Admitting: Vascular Surgery

## 2019-10-29 ENCOUNTER — Ambulatory Visit (INDEPENDENT_AMBULATORY_CARE_PROVIDER_SITE_OTHER): Payer: Medicare Other | Admitting: Physician Assistant

## 2019-10-29 ENCOUNTER — Other Ambulatory Visit: Payer: Self-pay

## 2019-10-29 VITALS — BP 130/86 | HR 78 | Temp 97.3°F | Resp 18 | Ht 72.0 in | Wt 144.0 lb

## 2019-10-29 DIAGNOSIS — I739 Peripheral vascular disease, unspecified: Secondary | ICD-10-CM | POA: Diagnosis not present

## 2019-10-29 DIAGNOSIS — I779 Disorder of arteries and arterioles, unspecified: Secondary | ICD-10-CM | POA: Insufficient documentation

## 2019-10-29 DIAGNOSIS — Z89511 Acquired absence of right leg below knee: Secondary | ICD-10-CM | POA: Diagnosis not present

## 2019-10-29 NOTE — Progress Notes (Signed)
Office Note     CC:  follow up Requesting Provider:  Bridget Hartshorn, NP  HPI: Clayton [name] is a 69 y.o. (01-21-1951) male who presents for follow up of his peripheral vascular disease. He is s/p attempted revascularization of left lower extremity on 12/07/18. At the time he had long segment SFA occlusion which could not be crossed percutaneously. On 12/08/18 he had to have a right above knee amputation for an ischemic right foot. Subsequent attempt at crossing this lesion was performed on 01/21/19 with successful retrograde access and stenting of the SFA. He had two vessel AT/PT runoff at this time. Following this he had to have Left second toe amputation and heel debridement on 03/02/19. He received IV Vanc and Zosyn via PICC line following d/c for his heel wound. He was last seen on 06/25/19 in the office for follow up with Dr. Donzetta Matters. At that visit he had completed his IV antibiotics and felt that his left heel was showing good signs of healing with good profusion.  He presents today for follow up with arterial duplex and ABIs. His left foot is healing well. He states that the wound care nurses at [facility] state it is doing well. He denies any pain, numbness, or drainage. He does not ambulate much because he has been instructed to not put any weight on left foot. Right aka doing well. No pain   The pt is on a statin for cholesterol management.  The pt is on a daily aspirin.   Other AC: No longer taking Plavix The pt on Betablocker The pt is diabetic on Insulin Tobacco hx: former  Past Medical History:  Diagnosis Date  . Acute on chronic respiratory failure with hypoxia ([place])   . Altered mental status   . CAD (coronary artery disease)    a. s/p BMS x 3 to RCA in 2009  . COPD (chronic obstructive pulmonary disease) ([place])   . COPD, severe ([place])   . Diabetes mellitus   . Embolic stroke involving right middle cerebral artery ([place])   . GERD (gastroesophageal reflux disease)   .  Guaiac positive stools 07/03/2018  . HTN (hypertension)   . Hyperlipidemia   . Myocardial infarction ([place]) 2009  . Non-STEMI (non-ST elevated myocardial infarction) ([place])   . Prostate cancer ([place])   . Pulmonary arterial hypertension ([place])   . PVD (peripheral vascular disease) ([place])     Past Surgical History:  Procedure Laterality Date  . ABDOMINAL AORTOGRAM W/LOWER EXTREMITY Left 01/21/2019   Procedure: ABDOMINAL AORTOGRAM W/LOWER EXTREMITY;  Surgeon: Waynetta Sandy, MD;  Location: [place] [facility];  Service: Cardiovascular;  Laterality: Left;  . AMPUTATION Right 12/08/2018   Procedure: AMPUTATION ABOVE KNEE;  Surgeon: Waynetta Sandy, MD;  Location: [place];  Service: Vascular;  Laterality: Right;  . AMPUTATION Left 03/02/2019   Procedure: AMPUTATION DIGIT LEFT SECOND TOE;  Surgeon: Waynetta Sandy, MD;  Location: [place];  Service: Vascular;  Laterality: Left;  . CARDIAC CATHETERIZATION     with stent  . cardiac stents  2009  . CATARACT EXTRACTION W/PHACO Left 08/19/2013   Procedure: CATARACT EXTRACTION PHACO AND INTRAOCULAR LENS PLACEMENT (IOC);  Surgeon: Tonny Branch, MD;  [facility];  Service: Ophthalmology;  Laterality: Left;  CDE:37.77  . CATARACT EXTRACTION W/PHACO Right 07/29/2016   Procedure: CATARACT EXTRACTION PHACO AND INTRAOCULAR LENS PLACEMENT RIGHT EYE; CDE:  18.10;  Surgeon: Tonny Branch, MD;  Location[facility];  Service: Ophthalmology;  Laterality: Right;  .  IR CT HEAD LTD  08/28/2018  . IR PERCUTANEOUS ART THROMBECTOMY/INFUSION INTRACRANIAL INC DIAG ANGIO  08/28/2018  . LOWER EXTREMITY ANGIOGRAPHY Bilateral 12/07/2018   Procedure: LOWER EXTREMITY ANGIOGRAPHY;  Surgeon: Waynetta Sandy, MD;  Location: [place] [facility];  Service: Cardiovascular;  Laterality: Bilateral;  . PERIPHERAL VASCULAR INTERVENTION Left 12/07/2018   Procedure: PERIPHERAL VASCULAR INTERVENTION;  Surgeon: Waynetta Sandy, MD;  Location: [place] [facility];  Service: Cardiovascular;  Laterality: Left;  . PERIPHERAL VASCULAR INTERVENTION Left 01/21/2019   Procedure: PERIPHERAL VASCULAR INTERVENTION;  Surgeon: Waynetta Sandy, MD;  Location: [place] [facility];  Service: Cardiovascular;  Laterality: Left;  SFA  . RADIOLOGY WITH ANESTHESIA N/A 08/28/2018   Procedure: IR WITH ANESTHESIA;  Surgeon: Radiologist, Medication, MD;  Location: [place];  Service: Radiology;  Laterality: N/A;  . WOUND DEBRIDEMENT Left 03/02/2019   Procedure: DEBRIDEMENT WOUND LEFT HEEL;  Surgeon: Waynetta Sandy, MD;  Location: [place];  Service: Vascular;  Laterality: Left;    Social History   Socioeconomic History  . Marital status: Single    Spouse name: Not on file  . Number of children: Not on file  . Years of education: Not on file  . Highest education level: Not on file  Occupational History  . Occupation: Associate Professor  Tobacco Use  . Smoking status: Former Smoker    Packs/day: 1.00    Years: 42.00    Pack years: 42.00    Types: Cigarettes    Start date: 07/07/1969  . Smokeless tobacco: Never Used  Substance and Sexual Activity  . Alcohol use: Not Currently    Alcohol/week: 0.0 standard drinks    Comment: rarely  . Drug use: No  . Sexual activity: Yes  Other Topics Concern  . Not on file  Social History Narrative  . Not on file   Social Determinants of Health   Financial Resource Strain:   . Difficulty of Paying Living Expenses: Not on file  Food Insecurity:   . Worried About Charity fundraiser in the Last Year: Not on file  . Ran Out of Food in the Last Year: Not on file  Transportation Needs:   . Lack of Transportation (Medical): Not on file  . Lack of Transportation (Non-Medical): Not on file  Physical Activity:   . Days of Exercise per Week: Not on file  . Minutes of Exercise per Session: Not on file  Stress:   . Feeling of Stress : Not on file  Social Connections:   . Frequency of Communication with Friends and  Family: Not on file  . Frequency of Social Gatherings with Friends and Family: Not on file  . Attends Religious Services: Not on file  . Active Member of Clubs or Organizations: Not on file  . Attends Archivist Meetings: Not on file  . Marital Status: Not on file  Intimate Partner Violence:   . Fear of Current or Ex-Partner: Not on file  . Emotionally Abused: Not on file  . Physically Abused: Not on file  . Sexually Abused: Not on file    Family History  Problem Relation Age of Onset  . Coronary artery disease Mother   . Coronary artery disease Father   . Prostate cancer Father     Current Outpatient Medications  Medication Sig Dispense Refill  . Acetaminophen 500 MG PACK Take 500 mg by mouth every 6 (six) hours as needed for moderate pain or fever.     Marland Kitchen  Amino Acids-Protein Hydrolys (FEEDING SUPPLEMENT, PRO-STAT SUGAR FREE 64,) LIQD Take 30 mLs by mouth 2 (two) times daily.    Marland Kitchen aspirin EC 81 MG tablet Take 81 mg by mouth daily.    . carvedilol (COREG) 6.25 MG tablet Place 1 tablet (6.25 mg total) into feeding tube 2 (two) times daily with a meal. (Patient taking differently: Take 6.25 mg by mouth 2 (two) times daily with a meal. )    . chlorhexidine gluconate, MEDLINE KIT, (PERIDEX) 0.12 % solution 15 mLs by Mouth Rinse route 2 (two) times daily. 120 mL 0  . ferrous sulfate 325 (65 FE) MG tablet Take 325 mg by mouth 3 (three) times daily with meals.    Marland Kitchen glipiZIDE (GLUCOTROL) 10 MG tablet Take 10 mg by mouth 2 (two) times daily before a meal.    . insulin lispro (HUMALOG) 100 UNIT/ML injection Inject 0-10 Units into the skin 3 (three) times daily before meals. 70-100 units= 0 units, 101-150= 1 units, 151-200= 2 units, 201-250= 4 units, 251-300= 6 units, 301-350= 8 units, 351-400= 10 units, greater than 450 = 10 units and call md    . metFORMIN (GLUCOPHAGE) 1000 MG tablet Take 1 tablet (1,000 mg total) by mouth 2 (two) times daily with a meal.    . Multiple Vitamin  (DAILY-VITAMIN PO) Take by mouth.    . Multiple Vitamin (MULTIVITAMIN WITH MINERALS) TABS tablet Take 1 tablet by mouth daily.    Marland Kitchen omeprazole (PRILOSEC) 20 MG capsule Take 20 mg by mouth daily.    . pravastatin (PRAVACHOL) 20 MG tablet Take 20 mg by mouth at bedtime.    . vitamin C (ASCORBIC ACID) 500 MG tablet Take 500 mg by mouth daily.     No current facility-administered medications for this visit.    No Known Allergies   REVIEW OF SYSTEMS:   '[X]'$  denotes positive finding, '[ ]'$  denotes negative finding Cardiac  Comments:  Chest pain or chest pressure:    Shortness of breath upon exertion:    Short of breath when lying flat:    Irregular heart rhythm:        Vascular    Pain in calf, thigh, or hip brought on by ambulation:    Pain in feet at night that wakes you up from your sleep:     Blood clot in your veins:    Leg swelling:         Pulmonary    Oxygen at home:    Productive cough:     Wheezing:         Neurologic    Sudden weakness in arms or legs:     Sudden numbness in arms or legs:     Sudden onset of difficulty speaking or slurred speech:    Temporary loss of vision in one eye:     Problems with dizziness:         Gastrointestinal    Blood in stool:     Vomited blood:         Genitourinary    Burning when urinating:     Blood in urine:        Psychiatric    Major depression:         Hematologic    Bleeding problems:    Problems with blood clotting too easily:        Skin    Rashes or ulcers:        Constitutional    Fever or chills:  PHYSICAL EXAMINATION:  Vitals:   10/29/19 1326  BP: 130/86  Pulse: 78  Resp: 18  Temp: (!) 97.3 F (36.3 C)  TempSrc: Temporal  SpO2: 97%  Weight: 144 lb (65.3 kg)  Height: 6' (1.829 m)    General: Well appearing,  Not in any distress Gait: in wheel chair HENT: WNL, normocephalic Pulmonary: normal non-labored breathing , without Rales, rhonchi,  wheezing Cardiac: regular HR, without  Murmurs  without carotid bruit Abdomen: soft, NT, no masses Skin: without rashes Vascular Exam/Pulses:  Right Left  Radial 2+ (normal) 2+ (normal)  Ulnar 1+ (weak) 1+ (weak)  Femoral 2+ (normal) 2+ (normal)  Popliteal  2+ (normal)  DP  2+ (normal)  PT  2+ (normal)   Extremities: without ischemic changes, without Gangrene , without cellulitis; without open wounds- left heel wound essentially healed. Superficial callus present. No tenderness. No erythema Musculoskeletal: no muscle wasting or atrophy  Neurologic: A&O X 3;  No focal weakness or paresthesias are detected Psychiatric:  The pt has Normal affect.   Non-Invasive Vascular Imaging:   R ABI/TBI not obtainable secondary to right above knee amputation L ABI/ TBI 1.06 /.94  some study limitation as patient was in wheel chair  Left lower extremity arterial duplex showing patent left SFA stent without stenosis.  RVT unable to visualize proximal to stent due to patient in wheel chair   ASSESSMENT/PLAN:: 69 y.o. male here for follow up for right above knee amputation and left lower extremity revascularization s/p left heel debridement and 2nd toe amputation. His wounds are healed. His non invasive vascular studies today show patent left superficial femoral artery stent and normal ABI. Patient has no new lower extremity symptoms. No new non healing wounds. He does not have any rest pain or claudication. He currently does not ambulate but wishes to start working with PT to walk now that his heel is healed. I discussed with him and his sister who was on the phone during visit that he could start some weight bearing but to avoid any prolonged ambulation and to keep the foot protected with adequate foot wear with off loading from the heel. He will continue his Aspirin and Pravastatin. Advised him that if he has new or recurrent heel wound, new pain in his legs, coldness, or numbness that he should follow up earlier.  He is scheduled for an annual follow  up with ABI and left lower extremity arterial duplex    Karoline Caldwell, PA-C [facility] [phone]  Clinic MD:   Donzetta Matters

## 2019-11-02 ENCOUNTER — Other Ambulatory Visit: Payer: Self-pay | Admitting: *Deleted

## 2019-11-02 DIAGNOSIS — I739 Peripheral vascular disease, unspecified: Secondary | ICD-10-CM

## 2020-02-01 IMAGING — DX DG KNEE COMPLETE 4+V*R*
4 series · 4 of 4 positions shown · non-contrast
Comparison: None.

CLINICAL DATA: Right knee pain following an unwitnessed fall.

EXAM:
RIGHT KNEE - COMPLETE 4+ VIEW

[knee ap]
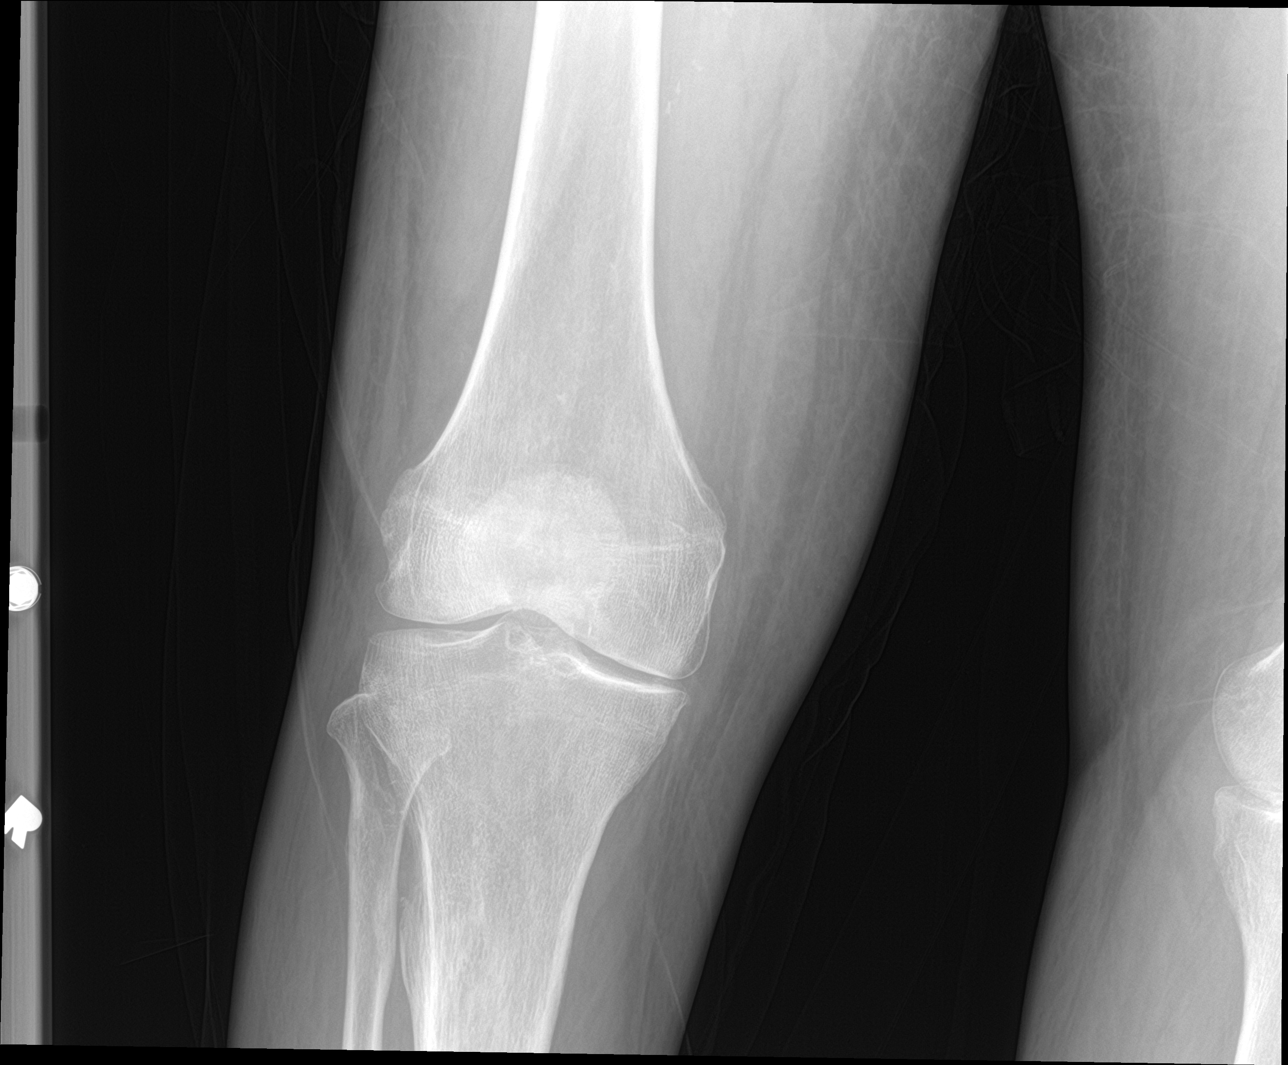

[knee lat]
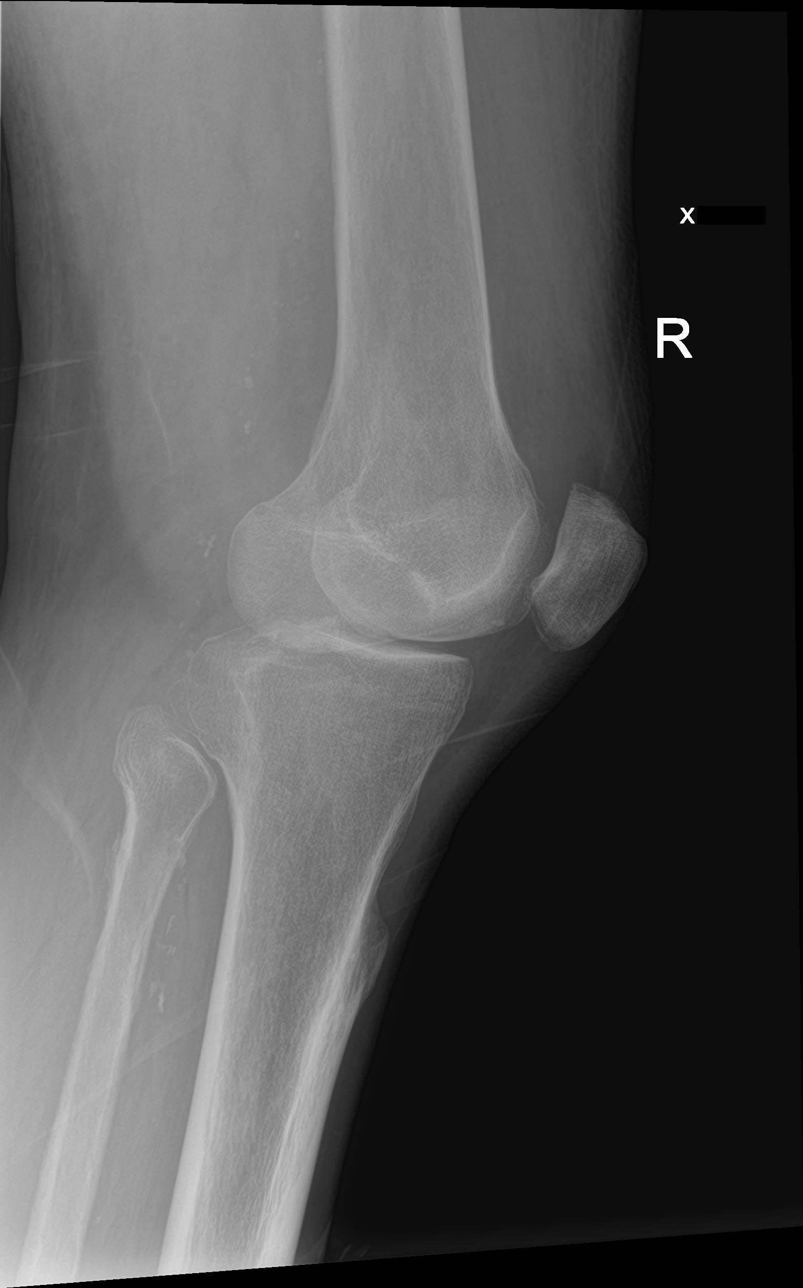

[knee sunrise]
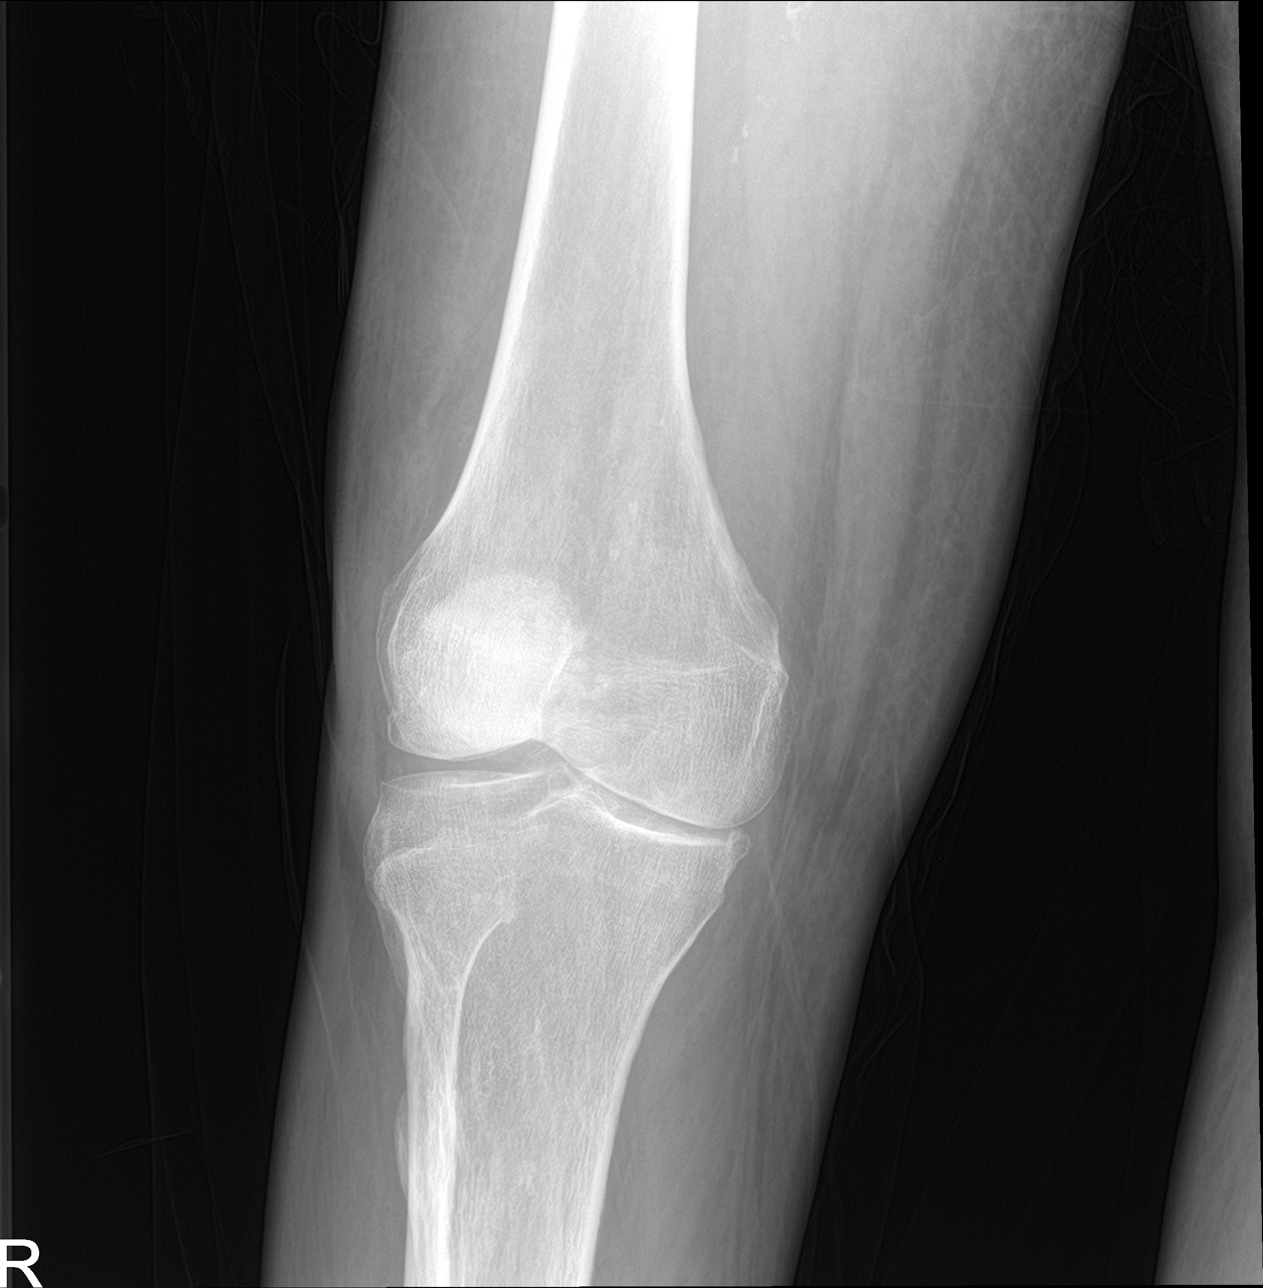

[tunnel]
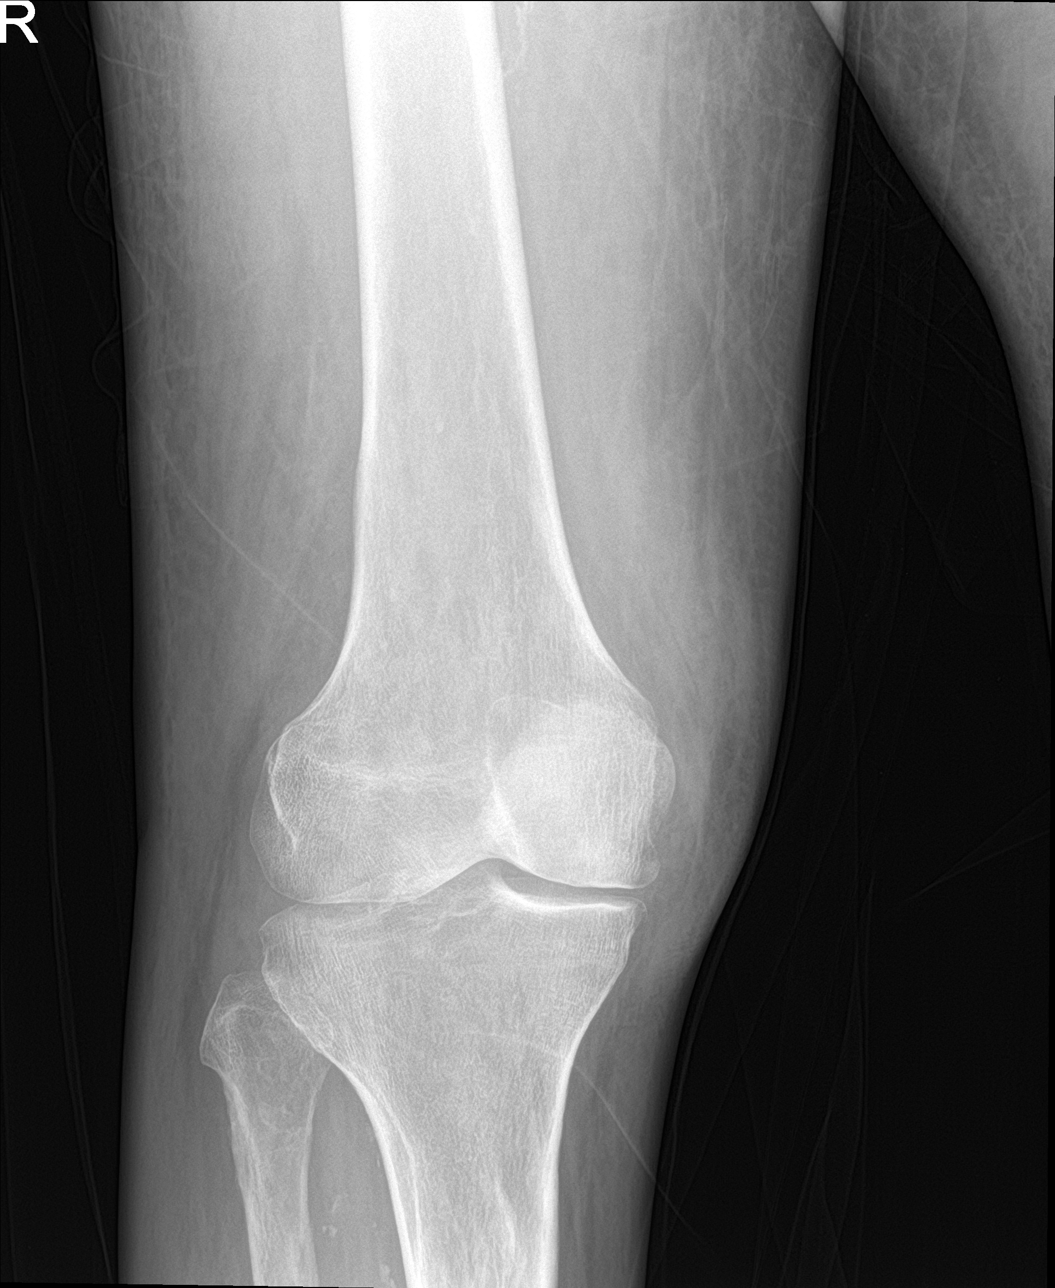

[4 of 4 positions shown; findings below may reference images not displayed]

FINDINGS: The bones are subjectively adequately mineralized. There's mild
narrowing of the medial joint compartment. There's mild beaking of
the tibial spines. There's no acute or healing fracture. A small
suprapatellar effusion is suspected.
IMPRESSION: There's no acute fracture nor dislocation of the right knee. Mild
degenerative narrowing of the medial compartment is present.

## 2020-02-01 IMAGING — CT CT HEAD W/O CM
3 series · 16 of 47 positions shown, 19 images · non-contrast
Comparison: None.

CLINICAL DATA: Altered level of consciousness.

EXAM:
CT HEAD WITHOUT CONTRAST
TECHNIQUE: Contiguous axial images were obtained from the base of the skull
through the vertex without intravenous contrast.

[Series 2: head trauma wo · axial · 0.45mm/px · z∈[+1701,+1846]mm · 10 of 35 slices shown, 13 images]
[im 3/35  brain]
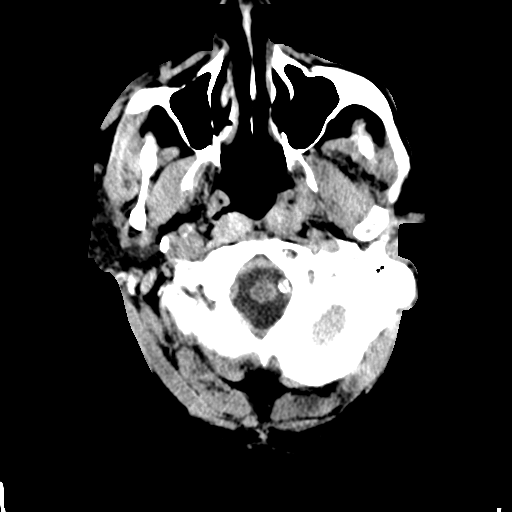
[im 3/35  bone]
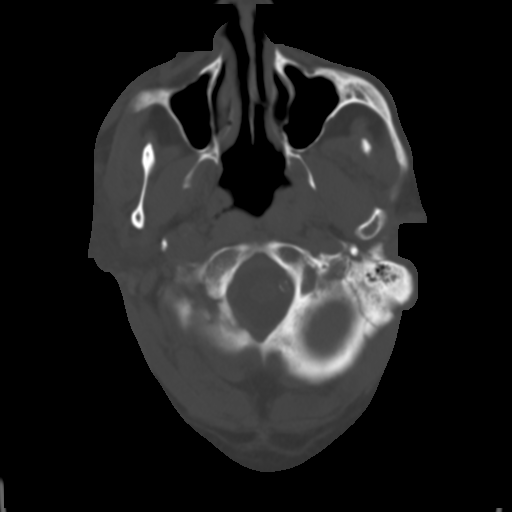
[im 6/35  brain]
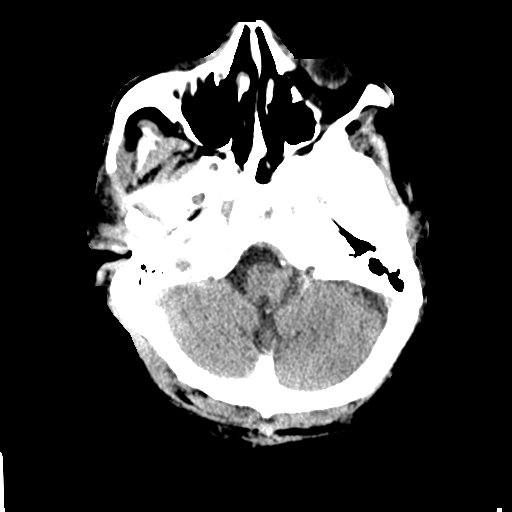
[im 10/35  brain]
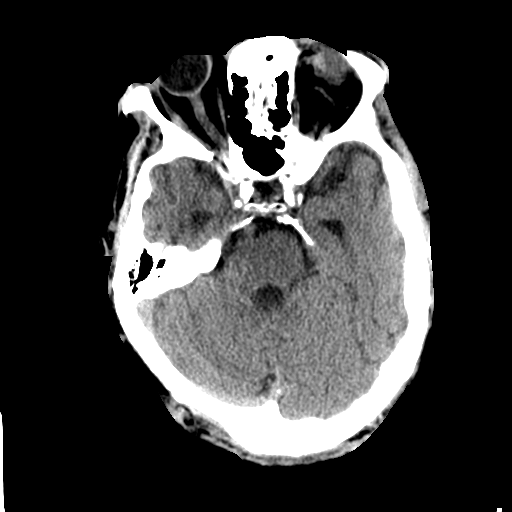
[im 12/35  brain]
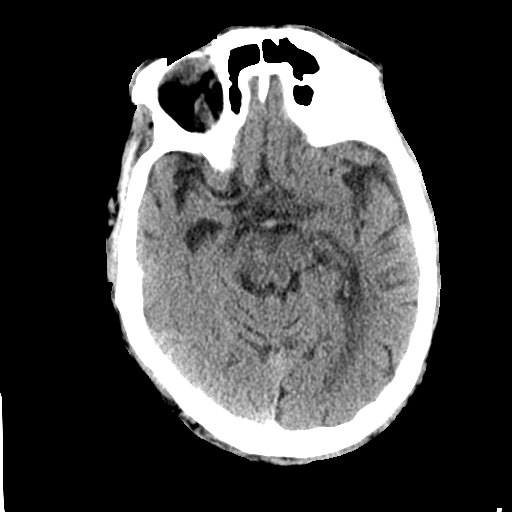
[im 16/35  brain]
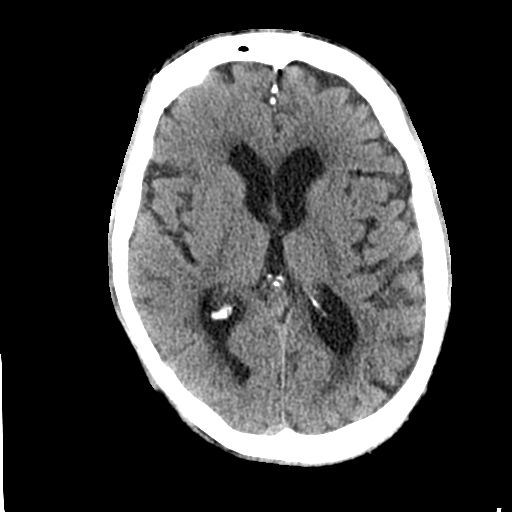
[im 16/35  bone]
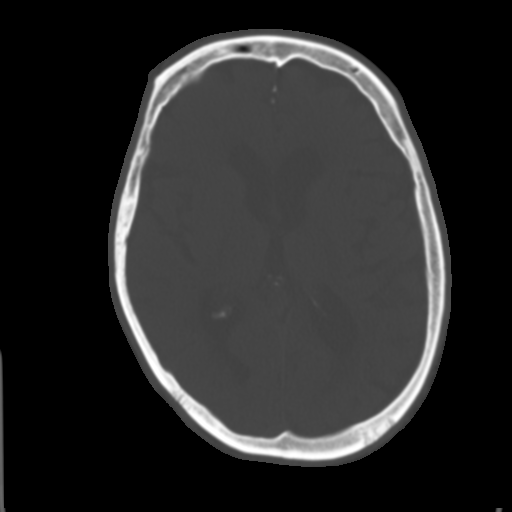
[im 19/35  brain]
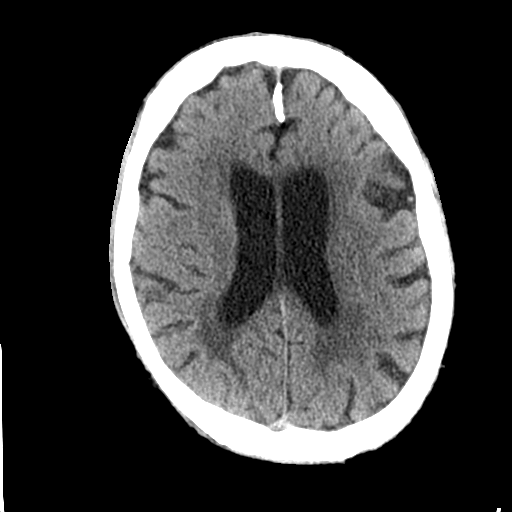
[im 23/35  brain]
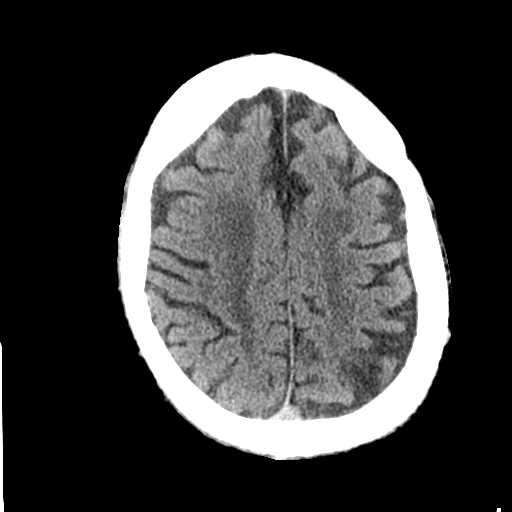
[im 26/35  brain]
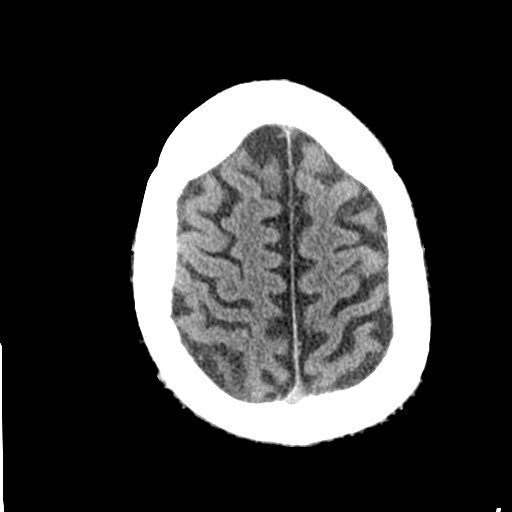
[im 29/35  brain]
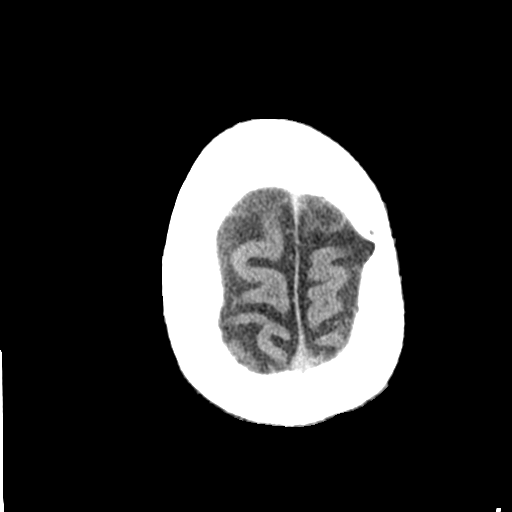
[im 29/35  bone]
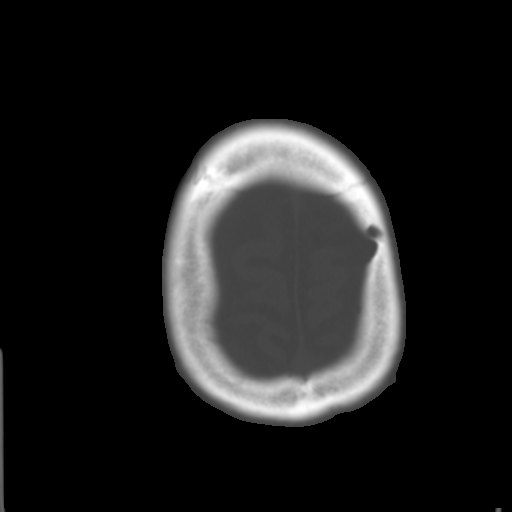
[im 32/35  brain]
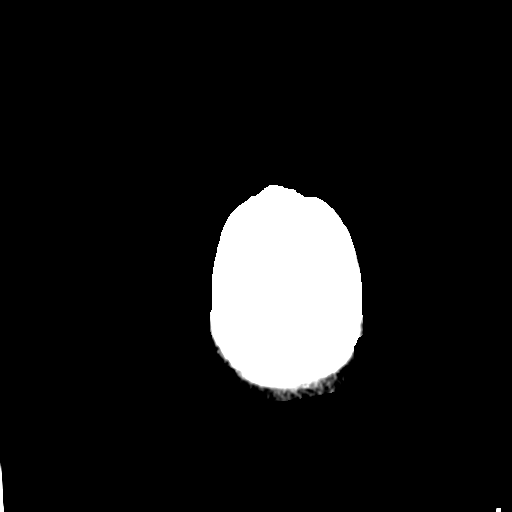

[Series 4: coronal soft tissue · coronal · 0.37mm/px · 3 of 83 slices shown]
[im 28/83  brain]
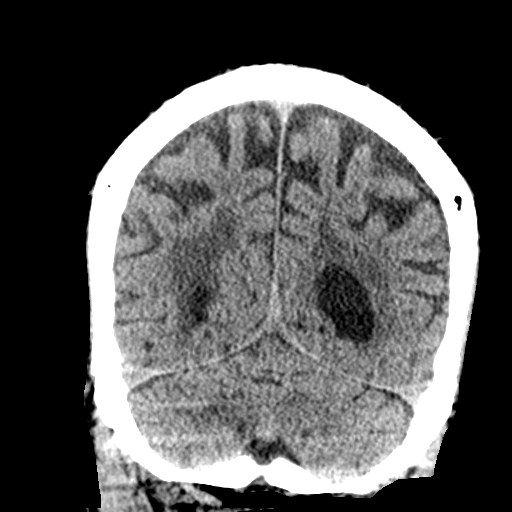
[im 37/83  brain]
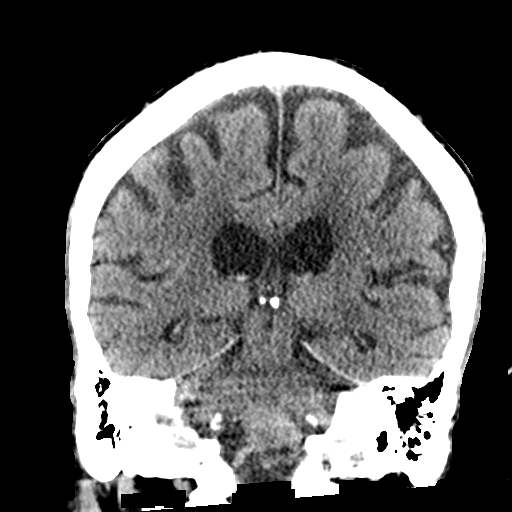
[im 46/83  brain]
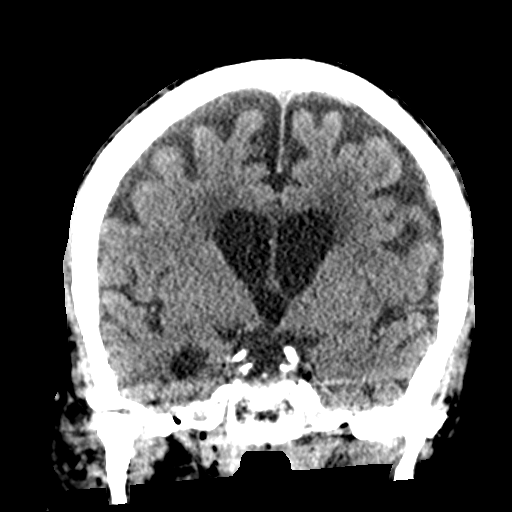

[Series 5: sagittal soft tissue · sagittal · 0.36mm/px · 3 of 65 slices shown]
[im 22/65  brain]
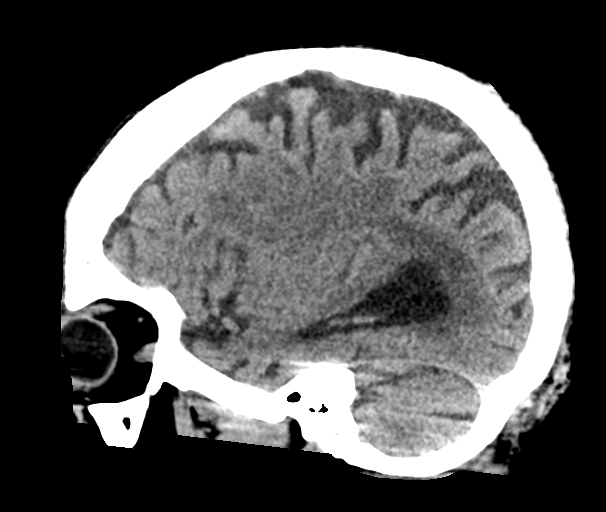
[im 33/65  brain]
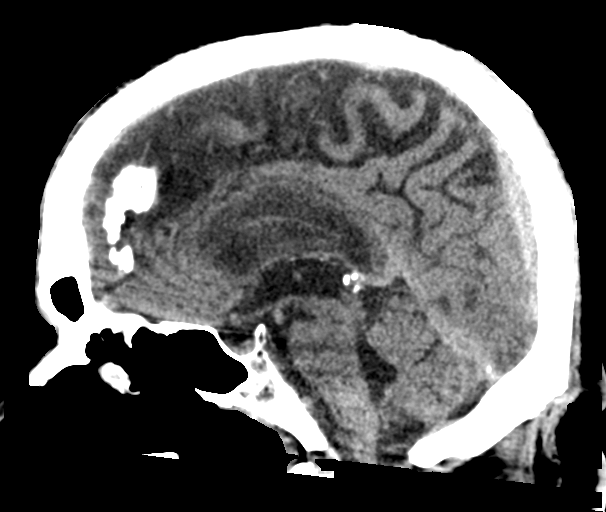
[im 43/65  brain]
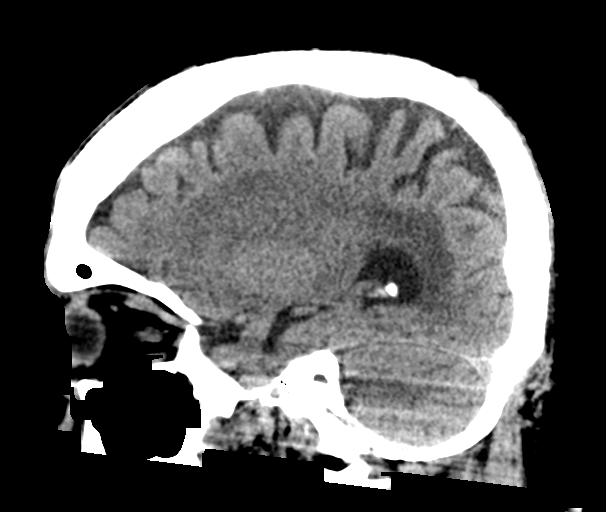

[16 of 47 positions shown; findings below may reference images not displayed]

FINDINGS: Brain: Mild to moderate atrophy. Hypodensity in the cerebral white
matter bilaterally most likely chronic. Negative for acute infarct,
hemorrhage, or mass.

Vascular: Atherosclerotic calcification without acute arterial
thrombosis.

Skull: Negative

Sinuses/Orbits: Paranasal sinuses clear. Bilateral cataract surgery.

Other: None
IMPRESSION: Atrophy and chronic microvascular ischemic changes in the white
matter. No acute abnormality.

## 2020-02-10 ENCOUNTER — Telehealth (INDEPENDENT_AMBULATORY_CARE_PROVIDER_SITE_OTHER): Payer: Medicare Other | Admitting: Cardiology

## 2020-02-10 ENCOUNTER — Encounter: Payer: Self-pay | Admitting: Cardiology

## 2020-02-10 VITALS — BP 174/82 | HR 78

## 2020-02-10 DIAGNOSIS — I2510 Atherosclerotic heart disease of native coronary artery without angina pectoris: Secondary | ICD-10-CM

## 2020-02-10 DIAGNOSIS — I779 Disorder of arteries and arterioles, unspecified: Secondary | ICD-10-CM

## 2020-02-10 DIAGNOSIS — I10 Essential (primary) hypertension: Secondary | ICD-10-CM

## 2020-02-10 MED ORDER — ASPIRIN EC 81 MG PO TBEC: 81.0000 mg | DELAYED_RELEASE_TABLET | Freq: Every day | ORAL | 3 refills | Status: AC

## 2020-02-10 NOTE — Progress Notes (Signed)
Virtual Visit via Telephone Note   This visit type was conducted due to national recommendations for restrictions regarding the COVID-19 Pandemic (e.g. social distancing) in an effort to limit this patient's exposure and mitigate transmission in our community.  Due to his co-morbid illnesses, this patient is at least at moderate risk for complications without adequate follow up.  This format is felt to be most appropriate for this patient at this time.  The patient did not have access to video technology/had technical difficulties with video requiring transitioning to audio format only (telephone).  All issues noted in this document were discussed and addressed.  No physical exam could be performed with this format.  Please refer to the patient's chart for his  consent to telehealth for [facility]   The patient was identified using 2 identifiers.  Date:  02/10/2020   ID:  Clayton [name], DOB 02-06-1951, MRN [id number]  Patient Location: [place] Provider Location: Office  PCP:  Bridget Hartshorn, NP  Cardiologist:  Carlyle Dolly, MD  Electrophysiologist:  None   Evaluation Performed:  Follow-Up Visit  Chief Complaint:  Follow up visit  History of Present Illness:    Clayton [name] is a 69 y.o. male seen today for follow up of the following medical problems.    1. CAD - hx of PCI in 2009. 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 LVEF at that time by LVgram 55%.  - 05/2015 echo LVEF 55%, mild AI   - no recent chest pain - no SOB/DOE - compliant with meds  ASA stopped 11/2018 according to nursing home, however remains on several discharge and clinic note MARs. Unclear history, not currently taking.     2. HTN -recent issues with low bp's, pcp lowered bp meds - bps typically 130s/60s, today elevated number seems to be an outlier.     3. COPD - noted on PFTs 11/2015, he was referred to Dr Luan Pulling at  that time     4. PAD - s/pleft second toe amputationand left heel debridement of skin and soft tissue to 5 x 6 cmon 03-02-19 by Dr. 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 Donzetta Matters for critical right lower extremity ischemia with severe tissue loss. - currently at [facility]   5. Hyperlipidemia - 06/2019 LDL < 77   The patient does not have symptoms concerning for COVID-19 infection (fever, chills, cough, or new shortness of breath).    Past Medical History:  Diagnosis Date  . Acute on chronic respiratory failure with hypoxia ([place])   . Altered mental status   . CAD (coronary artery disease)    a. s/p BMS x 3 to RCA in 2009  . COPD (chronic obstructive pulmonary disease) ([place])   . COPD, severe ([place])   . Diabetes mellitus   . Embolic stroke involving right middle cerebral artery ([place])   . GERD (gastroesophageal reflux disease)   . Guaiac positive stools 07/03/2018  . HTN (hypertension)   . Hyperlipidemia   . Myocardial infarction ([place]) 2009  . Non-STEMI (non-ST elevated myocardial infarction) ([place])   . Prostate cancer ([place])   . Pulmonary arterial hypertension ([place])   . PVD (peripheral vascular disease) ([place])    Past Surgical History:  Procedure Laterality Date  . ABDOMINAL AORTOGRAM W/LOWER EXTREMITY Left 01/21/2019   Procedure: ABDOMINAL AORTOGRAM W/LOWER EXTREMITY;  Surgeon: Servando Snare  Harrell Gave, MD;  Location: [place] [facility];  Service: Cardiovascular;  Laterality: Left;  . AMPUTATION Right 12/08/2018   Procedure: AMPUTATION ABOVE KNEE;  Surgeon: Waynetta Sandy, MD;  Location: [place];  Service: Vascular;  Laterality: Right;  . AMPUTATION Left 03/02/2019   Procedure: AMPUTATION DIGIT LEFT SECOND TOE;  Surgeon: Waynetta Sandy, MD;  Location: [place];  Service: Vascular;  Laterality: Left;  . CARDIAC CATHETERIZATION     with stent  . cardiac stents  2009  . CATARACT  EXTRACTION W/PHACO Left 08/19/2013   Procedure: CATARACT EXTRACTION PHACO AND INTRAOCULAR LENS PLACEMENT (IOC);  Surgeon: Tonny Saralyn Willison, MD;  [facility];  Service: Ophthalmology;  Laterality: Left;  CDE:37.77  . CATARACT EXTRACTION W/PHACO Right 07/29/2016   Procedure: CATARACT EXTRACTION PHACO AND INTRAOCULAR LENS PLACEMENT RIGHT EYE; CDE:  18.10;  Surgeon: Tonny Christino Mcglinchey, MD;  [facility];  Service: Ophthalmology;  Laterality: Right;  . IR CT HEAD LTD  08/28/2018  . IR PERCUTANEOUS ART THROMBECTOMY/INFUSION INTRACRANIAL INC DIAG ANGIO  08/28/2018  . LOWER EXTREMITY ANGIOGRAPHY Bilateral 12/07/2018   Procedure: LOWER EXTREMITY ANGIOGRAPHY;  Surgeon: Waynetta Sandy, MD;  Location: [place] [facility];  Service: Cardiovascular;  Laterality: Bilateral;  . PERIPHERAL VASCULAR INTERVENTION Left 12/07/2018   Procedure: PERIPHERAL VASCULAR INTERVENTION;  Surgeon: Waynetta Sandy, MD;  Location: [place] [facility];  Service: Cardiovascular;  Laterality: Left;  . PERIPHERAL VASCULAR INTERVENTION Left 01/21/2019   Procedure: PERIPHERAL VASCULAR INTERVENTION;  Surgeon: Waynetta Sandy, MD;  Location: [place] [facility];  Service: Cardiovascular;  Laterality: Left;  SFA  . RADIOLOGY WITH ANESTHESIA N/A 08/28/2018   Procedure: IR WITH ANESTHESIA;  Surgeon: Radiologist, Medication, MD;  Location: [place];  Service: Radiology;  Laterality: N/A;  . WOUND DEBRIDEMENT Left 03/02/2019   Procedure: DEBRIDEMENT WOUND LEFT HEEL;  Surgeon: Waynetta Sandy, MD;  Location: [place];  Service: Vascular;  Laterality: Left;     No outpatient medications have been marked as taking for the 02/10/20 encounter (Appointment) with Arnoldo Lenis, MD.     Allergies:   Patient has no known allergies.   Social History   Tobacco Use  . Smoking status: Former Smoker    Packs/day: 1.00    Years: 42.00    Pack years: 42.00    Types: Cigarettes    Start date: 07/07/1969  . Smokeless  tobacco: Never Used  Substance Use Topics  . Alcohol use: Not Currently    Alcohol/week: 0.0 standard drinks    Comment: rarely  . Drug use: No     Family Hx: The patient's family history includes Coronary artery disease in his father and mother; Prostate cancer in his father.  ROS:   Please see the history of present illness.     All other systems reviewed and are negative.   Prior CV studies:   The following studies were reviewed today:  11/2007 Cath HEMODYNAMIC RESULTS: Aorta 95/83 mmHg. Left ventricle 94/22 mmHg.  ANGIOGRAPHIC FINDINGS: 1. Left main coronary artery is free of significant flow-limiting  coronary atherosclerosis and gives rise to the left anterior  descending and circumflex vessels. 2. Left anterior descending is a medium-caliber vessel that extends  down to the apex. There are 3 diagonal branches. Within the  proximal portion of the vessel distal to the first diagonal Cherika Jessie  is an area of 40% to 50% stenosis followed by an area of 30%  stenosis in the midvessel. In the distal vessel there is an  area  of 30% diffuse stenosis. Flow was TIMI-3 in this vessel. 3. The circumflex vessel is medium in caliber. There are 4 obtuse  marginal branches. The first Nzinga Ferran is the largest. Within this  Laasia Arcos is an area of relatively focal 60% to 70% stenosis  surrounded by an area of approximately 50% to 60% stenosis in  diffuse fashion. Distal to this is an area of 50% more focal  stenosis. 4. Otherwise, there are relatively mild luminal irregularities to  approximately 20% in the circumflex vessel and an area of 30%  stenosis within the fourth obtuse marginal Terrion Gencarelli. 5. Right coronary artery is occluded in the proximal to midvessel  segment. There are faint left-to-right collaterals that fill a  portion of the distal vessel, although the remainder of the vessel  is not well  seen.  LEFT VENTRICULOGRAPHY: Performed in the RAO projection and reveals an ejection fraction of approximately 55% with mid to basal inferior akinesis and trace mitral regurgitation.  DIAGNOSES: 1. Coronary atherosclerosis as outlined including an occluded proximal  to mid right coronary artery associated with faint left-to-right  collateral filling of the distal vessel. There is otherwise  moderate left system disease including 60% to 70% stenosis  involving the obtuse marginal and 40% to 50% stenosis in the  proximal left anterior descending. 2. Left ventricular ejection fraction of approximately 55% with mid to  basal inferior akinesis, trace mitral regurgitation and left  ventricular end-diastolic pressure of 22 mmHg.  DISCUSSION: I reviewed the results with the patient and also discussed the films with Dr. Lia Foyer. At this point, I anticipate percutaneous intervention to treat the right coronary artery and otherwise medical therapy.   05/2015 echo  Study Conclusions  - Left ventricle: Technically dificiult study. 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 The cavity size was mildly dilated. Wall thickness was increased in a pattern of mild LVH. The estimated ejection fraction was 55%. - Aortic valve: Sclerosis without stenosis. There was mild regurgitation. - Right ventricle: The cavity size was normal. Systolic function was normal.  11/2015 PFTs Severe COPD  Labs/Other Tests and Data Reviewed:    EKG:  No ECG reviewed.  Recent Labs: 03/19/2019: ALT 10; BUN 8; Creatinine, Ser 0.66; Hemoglobin 7.8; Platelets 331; Potassium 4.0; Sodium 140   Recent Lipid Panel Lab Results  Component Value Date/Time   CHOL 112 08/28/2018 10:02 AM   TRIG 31 09/03/2018 05:45 AM   HDL 32 (L) 08/28/2018 10:02 AM   CHOLHDL 3.5 08/28/2018 10:02 AM   LDLCALC 65 08/28/2018 10:02 AM    Wt Readings from Last 3 Encounters:   10/29/19 144 lb (65.3 kg)  08/03/19 148 lb (67.1 kg)  06/25/19 162 lb (73.5 kg)     Objective:    Vital Signs:   Today's Vitals   02/10/20 1053  BP: (!) 174/82  Pulse: 78  SpO2: 91%   There is no height or weight on file to calculate BMI.  Normal affect. Normal speech pattern and tone. Comfortable, no apparent distress. No audible signs of sob or wheezing.   ASSESSMENT & PLAN:    1. CAD - no symptoms - unclear what happened with his aspirin, no longer taking at nursing home but active on all recent discharge and clinic MARs. Restart ASA 81mg  daily.   2. HTN -recent issues with low bp's, pcp made changes - on average 130s/60s at NH though outlier of high bp today, continue current meds  COVID-19 Education: The signs and symptoms of COVID-19  were discussed with the patient and how to seek care for testing (follow up with PCP or arrange E-visit).  The importance of social distancing was discussed today.  Time:   Today, I have spent 16 minutes with the patient with telehealth technology discussing the above problems.     Medication Adjustments/Labs and Tests Ordered: Current medicines are reviewed at length with the patient today.  Concerns regarding medicines are outlined above.   Tests Ordered: No orders of the defined types were placed in this encounter.   Medication Changes: No orders of the defined types were placed in this encounter.   Follow Up:  Either In Person or Virtual in 6 month(s)  Signed, Carlyle Dolly, MD  02/10/2020 9:50 AM    [place]

## 2020-02-10 NOTE — Addendum Note (Signed)
Addended by: Julian Hy T on: 02/10/2020 11:36 AM   Modules accepted: Orders

## 2020-02-10 NOTE — Patient Instructions (Signed)
Your physician wants you to follow-up in: [place] will receive a reminder letter in the mail two months in advance. If you don't receive a letter, please call our office to schedule the follow-up appointment.  Your physician has recommended you make the following change in your medication:   START ASPIRIN 81MG  DAILY   Thank you for choosing [facility]!!

## 2020-08-16 ENCOUNTER — Encounter: Payer: Self-pay | Admitting: *Deleted

## 2020-08-16 DEATH — deceased

## 2020-08-17 ENCOUNTER — Ambulatory Visit: Payer: Medicare Other | Admitting: Cardiology

## 2020-08-17 NOTE — Progress Notes (Deleted)
Clinical Summary Clayton [name] is a 69 y.o.male  seen today for follow up of the following medical problems.    1. CAD - hx of PCI in 2009. 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 LVEF at that time by LVgram 55%.  - 05/2015 echo LVEF 55%, mild AI   - no recent chest pain - no SOB/DOE - compliant with meds  ASA stopped 11/2018 according to nursing home, however remains on several discharge and clinic note MARs. Unclear history, not currently taking.     2. HTN -recent issues with low bp's, pcp lowered bp meds - bps typically 130s/60s, today elevated number seems to be an outlier.     3. COPD - noted on PFTs 11/2015, he was referred to Dr Clayton [name] at that time    4. PAD -s/pleft second toe amputationand left heel debridement of skin and soft tissue to 5 x 6 cmon 03-02-19 by Dr. 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 Donzetta [name] for critical right lower extremity ischemia with severe tissue loss. - currently at [facility]   5. Hyperlipidemia - 06/2019 LDL < 77    Past Medical History:  Diagnosis Date  . Acute on chronic respiratory failure with hypoxia ([place])   . Altered mental status   . CAD (coronary artery disease)    a. s/p BMS x 3 to RCA in 2009  . COPD (chronic obstructive pulmonary disease) ([place])   . COPD, severe ([place])   . Diabetes mellitus   . Embolic stroke involving right middle cerebral artery ([place])   . GERD (gastroesophageal reflux disease)   . Guaiac positive stools 07/03/2018  . HTN (hypertension)   . Hyperlipidemia   . Myocardial infarction ([place]) 2009  . Non-STEMI (non-ST elevated myocardial infarction) ([place])   . Prostate cancer ([place])   . Pulmonary arterial hypertension ([place])   . PVD (peripheral vascular disease) ([facility])      No Known Allergies   Current Outpatient Medications  Medication Sig Dispense Refill   . Acetaminophen 500 MG PACK Take 500 mg by mouth every 6 (six) hours as needed for moderate pain or fever.     . Amino Acids-Protein Hydrolys (FEEDING SUPPLEMENT, PRO-STAT SUGAR FREE 64,) LIQD Take 30 mLs by mouth 2 (two) times daily.    Marland Kitchen aspirin EC 81 MG tablet Take 1 tablet (81 mg total) by mouth daily. 90 tablet 3  . carvedilol (COREG) 6.25 MG tablet Place 1 tablet (6.25 mg total) into feeding tube 2 (two) times daily with a meal. (Patient taking differently: Take 6.25 mg by mouth 2 (two) times daily with a meal. )    . chlorhexidine gluconate, MEDLINE KIT, (PERIDEX) 0.12 % solution 15 mLs by Mouth Rinse route 2 (two) times daily. 120 mL 0  . ferrous sulfate 325 (65 FE) MG tablet Take 325 mg by mouth 3 (three) times daily with meals.    . metFORMIN (GLUCOPHAGE) 1000 MG tablet Take 1 tablet (1,000 mg total) by mouth 2 (two) times daily with a meal.    . Multiple Vitamin (DAILY-VITAMIN PO) Take by mouth.    . Multiple Vitamin (MULTIVITAMIN WITH MINERALS) TABS tablet Take 1 tablet by mouth daily.    Marland Kitchen omeprazole (PRILOSEC) 20 MG capsule Take 20 mg by mouth daily.    . pravastatin (PRAVACHOL) 20 MG tablet Take 20 mg by mouth at  bedtime.     No current facility-administered medications for this visit.     Past Surgical History:  Procedure Laterality Date  . ABDOMINAL AORTOGRAM W/LOWER EXTREMITY Left 01/21/2019   Procedure: ABDOMINAL AORTOGRAM W/LOWER EXTREMITY;  Surgeon: Clayton Sandy, MD;  Location: [place] [facility];  Service: Cardiovascular;  Laterality: Left;  . AMPUTATION Right 12/08/2018   Procedure: AMPUTATION ABOVE KNEE;  Surgeon: Clayton Sandy, MD;  Location: [place];  Service: Vascular;  Laterality: Right;  . AMPUTATION Left 03/02/2019   Procedure: AMPUTATION DIGIT LEFT SECOND TOE;  Surgeon: Clayton Sandy, MD;  Location: [place];  Service: Vascular;  Laterality: Left;  . CARDIAC CATHETERIZATION     with stent  . cardiac stents  2009  . CATARACT  EXTRACTION W/PHACO Left 08/19/2013   Procedure: CATARACT EXTRACTION PHACO AND INTRAOCULAR LENS PLACEMENT (IOC);  Surgeon: Clayton Haeli Gerlich, MD;  [facility];  Service: Ophthalmology;  Laterality: Left;  CDE:37.77  . CATARACT EXTRACTION W/PHACO Right 07/29/2016   Procedure: CATARACT EXTRACTION PHACO AND INTRAOCULAR LENS PLACEMENT RIGHT EYE; CDE:  18.10;  Surgeon: Clayton Aizen Duval, MD;  [facility];  Service: Ophthalmology;  Laterality: Right;  . IR CT HEAD LTD  08/28/2018  . IR PERCUTANEOUS ART THROMBECTOMY/INFUSION INTRACRANIAL INC DIAG ANGIO  08/28/2018  . LOWER EXTREMITY ANGIOGRAPHY Bilateral 12/07/2018   Procedure: LOWER EXTREMITY ANGIOGRAPHY;  Surgeon: Clayton Sandy, MD;  Location: [place] [facility];  Service: Cardiovascular;  Laterality: Bilateral;  . PERIPHERAL VASCULAR INTERVENTION Left 12/07/2018   Procedure: PERIPHERAL VASCULAR INTERVENTION;  Surgeon: Clayton Sandy, MD;  Location: [place] [facility];  Service: Cardiovascular;  Laterality: Left;  . PERIPHERAL VASCULAR INTERVENTION Left 01/21/2019   Procedure: PERIPHERAL VASCULAR INTERVENTION;  Surgeon: Clayton Sandy, MD;  Location: [place] [facility];  Service: Cardiovascular;  Laterality: Left;  SFA  . RADIOLOGY WITH ANESTHESIA N/A 08/28/2018   Procedure: IR WITH ANESTHESIA;  Surgeon: Radiologist, Medication, MD;  Location: [place];  Service: Radiology;  Laterality: N/A;  . WOUND DEBRIDEMENT Left 03/02/2019   Procedure: DEBRIDEMENT WOUND LEFT HEEL;  Surgeon: Clayton Sandy, MD;  Location: [place];  Service: Vascular;  Laterality: Left;     No Known Allergies    Family History  Problem Relation Age of Onset  . Coronary artery disease Mother   . Coronary artery disease Father   . Prostate cancer Father      Social History Clayton [name] reports that he has quit smoking. His smoking use included cigarettes. He started smoking about 51 years ago. He has a 42.00 pack-year smoking history. He has  never used smokeless tobacco. Clayton [name] reports previous alcohol use.   Review of Systems CONSTITUTIONAL: No weight loss, fever, chills, weakness or fatigue.  HEENT: Eyes: No visual loss, blurred vision, double vision or yellow sclerae.No hearing loss, sneezing, congestion, runny nose or sore throat.  SKIN: No rash or itching.  CARDIOVASCULAR:  RESPIRATORY: No shortness of breath, cough or sputum.  GASTROINTESTINAL: No anorexia, nausea, vomiting or diarrhea. No abdominal pain or blood.  GENITOURINARY: No burning on urination, no polyuria NEUROLOGICAL: No headache, dizziness, syncope, paralysis, ataxia, numbness or tingling in the extremities. No change in bowel or bladder control.  MUSCULOSKELETAL: No muscle, back pain, joint pain or stiffness.  LYMPHATICS: No enlarged nodes. No history of splenectomy.  PSYCHIATRIC: No history of depression or anxiety.  ENDOCRINOLOGIC: No reports of sweating, cold or heat intolerance. No polyuria or polydipsia.  Marland Kitchen   Physical Examination There were  no vitals filed for this visit. There were no vitals filed for this visit.  Gen: resting comfortably, no acute distress HEENT: no scleral icterus, pupils equal round and reactive, no palptable cervical adenopathy,  CV Resp: Clear to auscultation bilaterally GI: abdomen is soft, non-tender, non-distended, normal bowel sounds, no hepatosplenomegaly MSK: extremities are warm, no edema.  Skin: warm, no rash Neuro:  no focal deficits Psych: appropriate affect   Diagnostic Studies 11/2007 Cath HEMODYNAMIC RESULTS: Aorta 95/83 mmHg. Left ventricle 94/22 mmHg.  ANGIOGRAPHIC FINDINGS: 1. Left main coronary artery is free of significant flow-limiting  coronary atherosclerosis and gives rise to the left anterior  descending and circumflex vessels. 2. Left anterior descending is a medium-caliber vessel that extends  down to the apex. There are 3 diagonal branches. Within the  proximal  portion of the vessel distal to the first diagonal Zeb Rawl  is an area of 40% to 50% stenosis followed by an area of 30%  stenosis in the midvessel. In the distal vessel there is an area  of 30% diffuse stenosis. Flow was TIMI-3 in this vessel. 3. The circumflex vessel is medium in caliber. There are 4 obtuse  marginal branches. The first Vinie Charity is the largest. Within this  Lauro Manlove is an area of relatively focal 60% to 70% stenosis  surrounded by an area of approximately 50% to 60% stenosis in  diffuse fashion. Distal to this is an area of 50% more focal  stenosis. 4. Otherwise, there are relatively mild luminal irregularities to  approximately 20% in the circumflex vessel and an area of 30%  stenosis within the fourth obtuse marginal Ethridge Sollenberger. 5. Right coronary artery is occluded in the proximal to midvessel  segment. There are faint left-to-right collaterals that fill a  portion of the distal vessel, although the remainder of the vessel  is not well seen.  LEFT VENTRICULOGRAPHY: Performed in the RAO projection and reveals an ejection fraction of approximately 55% with mid to basal inferior akinesis and trace mitral regurgitation.  DIAGNOSES: 1. Coronary atherosclerosis as outlined including an occluded proximal  to mid right coronary artery associated with faint left-to-right  collateral filling of the distal vessel. There is otherwise  moderate left system disease including 60% to 70% stenosis  involving the obtuse marginal and 40% to 50% stenosis in the  proximal left anterior descending. 2. Left ventricular ejection fraction of approximately 55% with mid to  basal inferior akinesis, trace mitral regurgitation and left  ventricular end-diastolic pressure of 22 mmHg.  DISCUSSION: I reviewed the results with the patient and also discussed the films with Dr. Lia Foyer. At this point,  I anticipate percutaneous intervention to treat the right coronary artery and otherwise medical therapy.   05/2015 echo  Study Conclusions  - Left ventricle: Technically dificiult study. 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 The cavity size was mildly dilated. Wall thickness was increased in a pattern of mild LVH. The estimated ejection fraction was 55%. - Aortic valve: Sclerosis without stenosis. There was mild regurgitation. - Right ventricle: The cavity size was normal. Systolic function was normal.  11/2015 PFTs Severe COPD    Assessment and Plan  1. CAD -no symptoms - unclear what happened with his aspirin, no longer taking at nursing home but active on all recent discharge and clinic MARs. Restart ASA $RemoveBef'81mg'[id number]$  daily.   2. HTN -recent issues with low bp's, pcp made changes - on average 130s/60s at NH though outlier of high bp today, continue current meds  Arnoldo Lenis, M.D.
